# Patient Record
Sex: Female | Born: 1935 | Race: Black or African American | Hispanic: No | Smoking: Never smoker
Health system: Southern US, Community
[De-identification: ages and names within clinical notes are randomized; demographics above are authoritative.]

## PROBLEM LIST (undated history)

## (undated) DIAGNOSIS — M790 Rheumatism, unspecified: Secondary | ICD-10-CM

## (undated) DIAGNOSIS — E785 Hyperlipidemia, unspecified: Secondary | ICD-10-CM

## (undated) DIAGNOSIS — I10 Essential (primary) hypertension: Secondary | ICD-10-CM

## (undated) DIAGNOSIS — I38 Endocarditis, valve unspecified: Secondary | ICD-10-CM

## (undated) DIAGNOSIS — M549 Dorsalgia, unspecified: Secondary | ICD-10-CM

## (undated) DIAGNOSIS — I452 Bifascicular block: Secondary | ICD-10-CM

## (undated) DIAGNOSIS — R002 Palpitations: Secondary | ICD-10-CM

## (undated) DIAGNOSIS — M1990 Unspecified osteoarthritis, unspecified site: Secondary | ICD-10-CM

## (undated) DIAGNOSIS — C50919 Malignant neoplasm of unspecified site of unspecified female breast: Secondary | ICD-10-CM

## (undated) DIAGNOSIS — F0390 Unspecified dementia without behavioral disturbance: Secondary | ICD-10-CM

## (undated) DIAGNOSIS — F419 Anxiety disorder, unspecified: Secondary | ICD-10-CM

## (undated) HISTORY — DX: Dorsalgia, unspecified: M54.9

## (undated) HISTORY — DX: Endocarditis, valve unspecified: I38

## (undated) HISTORY — DX: Palpitations: R00.2

## (undated) HISTORY — DX: Hyperlipidemia, unspecified: E78.5

## (undated) HISTORY — DX: Malignant neoplasm of unspecified site of unspecified female breast: C50.919

## (undated) HISTORY — PX: SHOULDER SURGERY: SHX246

## (undated) HISTORY — PX: EYE SURGERY: SHX253

## (undated) HISTORY — PX: APPENDECTOMY: SHX54

## (undated) HISTORY — PX: JOINT REPLACEMENT: SHX530

## (undated) HISTORY — PX: KNEE SURGERY: SHX244

## (undated) HISTORY — DX: Unspecified osteoarthritis, unspecified site: M19.90

## (undated) HISTORY — DX: Bifascicular block: I45.2

## (undated) HISTORY — DX: Essential (primary) hypertension: I10

## (undated) HISTORY — PX: ABDOMINAL HYSTERECTOMY: SHX81

## (undated) HISTORY — DX: Rheumatism, unspecified: M79.0

---

## 1999-01-03 ENCOUNTER — Encounter: Payer: Self-pay | Admitting: Family Medicine

## 1999-01-03 ENCOUNTER — Ambulatory Visit (HOSPITAL_COMMUNITY): Admission: RE | Admit: 1999-01-03 | Discharge: 1999-01-03 | Payer: Self-pay | Admitting: Family Medicine

## 1999-09-14 ENCOUNTER — Ambulatory Visit (HOSPITAL_COMMUNITY): Admission: RE | Admit: 1999-09-14 | Discharge: 1999-09-14 | Payer: Self-pay | Admitting: Family Medicine

## 1999-09-14 ENCOUNTER — Encounter: Payer: Self-pay | Admitting: Family Medicine

## 1999-10-16 ENCOUNTER — Other Ambulatory Visit: Admission: RE | Admit: 1999-10-16 | Discharge: 1999-10-16 | Payer: Self-pay | Admitting: *Deleted

## 1999-10-24 ENCOUNTER — Ambulatory Visit (HOSPITAL_COMMUNITY): Admission: RE | Admit: 1999-10-24 | Discharge: 1999-10-24 | Payer: Self-pay | Admitting: Family Medicine

## 2001-08-27 ENCOUNTER — Ambulatory Visit (HOSPITAL_COMMUNITY): Admission: RE | Admit: 2001-08-27 | Discharge: 2001-08-27 | Payer: Self-pay | Admitting: Family Medicine

## 2001-08-27 ENCOUNTER — Encounter: Payer: Self-pay | Admitting: Family Medicine

## 2003-06-08 ENCOUNTER — Ambulatory Visit (HOSPITAL_COMMUNITY): Admission: RE | Admit: 2003-06-08 | Discharge: 2003-06-08 | Payer: Self-pay | Admitting: Family Medicine

## 2003-07-28 ENCOUNTER — Other Ambulatory Visit: Admission: RE | Admit: 2003-07-28 | Discharge: 2003-07-28 | Payer: Self-pay | Admitting: Family Medicine

## 2003-12-06 ENCOUNTER — Ambulatory Visit (HOSPITAL_COMMUNITY): Admission: RE | Admit: 2003-12-06 | Discharge: 2003-12-06 | Payer: Self-pay | Admitting: Gastroenterology

## 2003-12-06 ENCOUNTER — Encounter (INDEPENDENT_AMBULATORY_CARE_PROVIDER_SITE_OTHER): Payer: Self-pay | Admitting: Specialist

## 2006-07-21 ENCOUNTER — Encounter: Admission: RE | Admit: 2006-07-21 | Discharge: 2006-07-21 | Payer: Self-pay | Admitting: Orthopedic Surgery

## 2006-09-18 ENCOUNTER — Ambulatory Visit (HOSPITAL_COMMUNITY): Admission: RE | Admit: 2006-09-18 | Discharge: 2006-09-19 | Payer: Self-pay | Admitting: Orthopedic Surgery

## 2006-10-23 ENCOUNTER — Encounter: Admission: RE | Admit: 2006-10-23 | Discharge: 2006-11-21 | Payer: Self-pay | Admitting: Orthopedic Surgery

## 2007-03-10 ENCOUNTER — Inpatient Hospital Stay (HOSPITAL_COMMUNITY): Admission: RE | Admit: 2007-03-10 | Discharge: 2007-03-14 | Payer: Self-pay | Admitting: Orthopedic Surgery

## 2008-09-29 ENCOUNTER — Encounter: Admission: RE | Admit: 2008-09-29 | Discharge: 2008-09-29 | Payer: Self-pay | Admitting: Family Medicine

## 2009-07-26 ENCOUNTER — Encounter: Payer: Self-pay | Admitting: Cardiology

## 2009-09-06 ENCOUNTER — Ambulatory Visit: Payer: Self-pay

## 2009-09-06 ENCOUNTER — Ambulatory Visit: Payer: Self-pay | Admitting: Cardiology

## 2009-09-06 DIAGNOSIS — E785 Hyperlipidemia, unspecified: Secondary | ICD-10-CM | POA: Insufficient documentation

## 2009-09-06 DIAGNOSIS — I359 Nonrheumatic aortic valve disorder, unspecified: Secondary | ICD-10-CM | POA: Insufficient documentation

## 2009-09-06 DIAGNOSIS — R1900 Intra-abdominal and pelvic swelling, mass and lump, unspecified site: Secondary | ICD-10-CM | POA: Insufficient documentation

## 2009-09-06 DIAGNOSIS — R002 Palpitations: Secondary | ICD-10-CM | POA: Insufficient documentation

## 2009-09-06 DIAGNOSIS — I080 Rheumatic disorders of both mitral and aortic valves: Secondary | ICD-10-CM | POA: Insufficient documentation

## 2009-09-06 DIAGNOSIS — I10 Essential (primary) hypertension: Secondary | ICD-10-CM

## 2009-09-25 ENCOUNTER — Ambulatory Visit: Payer: Self-pay

## 2009-09-25 ENCOUNTER — Encounter: Payer: Self-pay | Admitting: Cardiology

## 2009-09-25 ENCOUNTER — Ambulatory Visit: Payer: Self-pay | Admitting: Cardiovascular Disease

## 2009-09-25 ENCOUNTER — Ambulatory Visit (HOSPITAL_COMMUNITY): Admission: RE | Admit: 2009-09-25 | Discharge: 2009-09-25 | Payer: Self-pay | Admitting: Cardiology

## 2009-10-02 ENCOUNTER — Telehealth: Payer: Self-pay | Admitting: Cardiology

## 2009-10-06 ENCOUNTER — Encounter: Payer: Self-pay | Admitting: Cardiology

## 2010-04-10 NOTE — Procedures (Signed)
Summary: Summary Report  Summary Report   Imported By: Erle Crocker 10/13/2009 11:20:25  _____________________________________________________________________  External Attachment:    Type:   Image     Comment:   External Document

## 2010-04-10 NOTE — Progress Notes (Signed)
Summary: returning call  Phone Note Call from Patient   Caller: Patient Reason for Call: Talk to Nurse Summary of Call: pt returning call-[phone] Initial call taken by: Glynda Jaeger,  October 02, 2009 2:35 PM  Follow-up for Phone Call        I spoke with the pt. Follow-up by: Sherri Rad, RN, BSN,  October 02, 2009 4:45 PM

## 2010-04-10 NOTE — Progress Notes (Signed)
Summary: [facility] Med List   [facility] Med List   Imported By: Roderic Ovens 09/08/2009 09:57:34  _____________________________________________________________________  External Attachment:    Type:   Image     Comment:   External Document

## 2010-04-10 NOTE — Letter (Signed)
Summary: [facility] Office Visit Note   [facility] Visit Note   Imported By: Roderic Ovens 09/08/2009 09:56:51  _____________________________________________________________________  External Attachment:    Type:   Image     Comment:   External Document

## 2010-04-10 NOTE — Assessment & Plan Note (Signed)
Summary: np6   Visit Type:  Initial Consult Primary Provider:  dr bland   History of Present Illness: Patient is 77 years old and is referred for evaluation of an irregular heartbeat and a heart murmur. She has known about an irregular heartbeat for more than a year. She cannot feel any palpitations but when she takes her pulse she sometimes notices a skip. She was recently seen for evaluation by the [facility] nursing staff at her assisted living facility and then later by Dr. [facility] Simmers at which time a new heart murmur was heard. She did not aware of this previously.  She has had no chest pain and no shortness of breath and no palpitations. She is quite active and transports her granddaughter to sports events and many other events. She has been doing regular exercise at her assisted living until recently when she was losing weight and decided to cut back.  Her risk profile for vascular disease includes hypertension and hyperlipidemia.  Current Medications (verified): 1)  Amlodipine Besylate 10 Mg Tabs (Amlodipine Besylate) .Marland Kitchen.. 1 Tab Once Daily 2)  Celebrex 200 Mg Caps (Celecoxib) .Marland Kitchen.. 1 Tab Once Daily 3)  Timolol Maleate 0.25 % Soln (Timolol Maleate) .Marland Kitchen.. 1 Drop in Left Eye Twice A Day 4)  Cozaar 100 Mg Tabs (Losartan Potassium) .Marland Kitchen.. 1 Tab Once Daily 5)  Aspirin 81 Mg Tbec (Aspirin) .... As Needed 6)  Xalatan 0.005 % Soln (Latanoprost) .Marland Kitchen.. 1 Drop Both Eyes 7)  Crestor 5 Mg Tabs (Rosuvastatin Calcium) .... Take One Tablet By Mouth Daily. 8)  Multivitamins   Tabs (Multiple Vitamin) .Marland Kitchen.. 1 Tab Once Daily 9)  Iron Otc .Marland Kitchen.. 1 Tab Once Daily  Allergies (verified): No Known Drug Allergies  Past History:  Past Medical History: Reviewed history from 09/06/2009 and no changes required. Arthritis/ rheumantism  back pain hypertension irregular pulse  Family History: Reviewed history from 09/06/2009 and no changes required. Father Bleeding Disorder and Diabetes Mother: Bleeding Disorder and  Diabetes  Social History: martial status :widowed Alcohol : Non -Drinker Tobacco- Smoking Status :Quit Smoke Occupation : Retired  Drugs :Takes no drugs  She lives in assisted living and is involved in taking care of her granddaughter.  Review of Systems       ROS is negative except as outlined in HPI.   Vital Signs:  Patient profile:   77 year old female Height:      59 inches Weight:      124 pounds BMI:     25.14 Pulse rate:   68 / minute BP sitting:   142 / 78  (left arm)  Vitals Entered By: Burnett Kanaris, CNA (September 06, 2009 9:55 AM)  Physical Exam  Additional Exam:  Gen. Well-nourished, in no distress   Neck: No JVD, thyroid not enlarged, no carotid bruits Lungs: No tachypnea, clear without rales, rhonchi or wheezes Cardiovascular: Rhythm regular, PMI not displaced,  heart sounds  normal, grade 2/6 early diastolic murmur at the left sternal edge, no peripheral edema, pulses normal in all 4 extremities. Abdomen: BS normal, abdomen soft and non-tender without masses or organomegaly, no hepatosplenomegaly. MS: No deformities, no cyanosis or clubbing   Neuro:  No focal sns   Skin:  no lesions    Impression & Recommendations:  Problem # 1:  AORTIC STENOSIS/ INSUFFICIENCY, NON-RHEUMATIC (ICD-424.1)  She has a murmur of aortic insufficiency. This does not appear to be severe. I think it is unlikely that this will require any treatment. We will evaluate  this further with a 2-D echocardiogram. Her updated medication list for this problem includes:    Cozaar 100 Mg Tabs (Losartan potassium) .Marland Kitchen... 1 tab once daily  Her updated medication list for this problem includes:    Cozaar 100 Mg Tabs (Losartan potassium) .Marland Kitchen... 1 tab once daily  Orders: EKG w/ Interpretation (93000) Echocardiogram (Echo)  Problem # 2:  PALPITATIONS (ICD-785.1)  She has an irregular pulse but really has not had any symptoms from this including palpitations. We will plan to evaluate this further  with a Holter monitor. I did hear some skips on examination but we did not catch any means on her 12-lead ECG. Her updated medication list for this problem includes:    Amlodipine Besylate 10 Mg Tabs (Amlodipine besylate) .Marland Kitchen... 1 tab once daily    Aspirin 81 Mg Tbec (Aspirin) .Marland Kitchen... As needed  Her updated medication list for this problem includes:    Amlodipine Besylate 10 Mg Tabs (Amlodipine besylate) .Marland Kitchen... 1 tab once daily    Aspirin 81 Mg Tbec (Aspirin) .Marland Kitchen... As needed  Orders: EKG w/ Interpretation (93000) Echocardiogram (Echo) Holter Monitor (Holter Monitor)  Problem # 3:  HYPERTENSION, BENIGN (ICD-401.1)  Her blood pressure was borderline today but has been lower on previous readings. This problem appears to be well-controlled on current medications. Her updated medication list for this problem includes:    Amlodipine Besylate 10 Mg Tabs (Amlodipine besylate) .Marland Kitchen... 1 tab once daily    Cozaar 100 Mg Tabs (Losartan potassium) .Marland Kitchen... 1 tab once daily    Aspirin 81 Mg Tbec (Aspirin) .Marland Kitchen... As needed  Her updated medication list for this problem includes:    Amlodipine Besylate 10 Mg Tabs (Amlodipine besylate) .Marland Kitchen... 1 tab once daily    Cozaar 100 Mg Tabs (Losartan potassium) .Marland Kitchen... 1 tab once daily    Aspirin 81 Mg Tbec (Aspirin) .Marland Kitchen... As needed  Problem # 4:  HYPERLIPIDEMIA-MIXED (ICD-272.4)  She is currently on Crestor. His promise managed by her primary care physician. According to patient she plans to try diet with plans to get off her statin in the near future. Her updated medication list for this problem includes:    Crestor 5 Mg Tabs (Rosuvastatin calcium) .Marland Kitchen... Take one tablet by mouth daily.  Her updated medication list for this problem includes:    Crestor 5 Mg Tabs (Rosuvastatin calcium) .Marland Kitchen... Take one tablet by mouth daily.  Other Orders: Abdominal Aorta Duplex (Abd Aorta Duplex)  Patient Instructions: 1)  Your physician has requested that you have an abdominal aorta  duplex. During this test, an ultrasound is used to evaluate the aorta. Allow 30 minutes for this exam. Do not eat after midnight the day before and avoid carbonated beverages. There are no restrictions or special instructions. 2)  Your physician has requested that you have an echocardiogram.  Echocardiography is a painless test that uses sound waves to create images of your heart. It provides your doctor with information about the size and shape of your heart and how well your heart's chambers and valves are working.  This procedure takes approximately one hour. There are no restrictions for this procedure. 3)  Your physician has recommended that you wear a 24 hour holter monitor.  Holter monitors are medical devices that record the heart's electrical activity. Doctors most often use these monitors to diagnose arrhythmias. Arrhythmias are problems with the speed or rhythm of the heartbeat. The monitor is a small, portable device. You can wear one while you do your normal  daily activities. This is usually used to diagnose what is causing palpitations/syncope (passing out). 4)  Your physician recommends that you continue on your current medications as directed. Please refer to the Current Medication list given to you today. 5)  We will call you with the results of your tests. If they show any abnormalities, we will schedule you a followup appointment.

## 2010-07-24 NOTE — Op Note (Signed)
Brianna Hunter, Brianna Hunter           ACCOUNT NO.:  [id number]   MEDICAL RECORD NO.:  [id number]          PATIENT TYPE:  INP   LOCATION:  2899                         FACILITY:  [facility]   PHYSICIAN:  Myrtie Neither, MD      DATE OF BIRTH:  07/18/1928   DATE OF PROCEDURE:  03/10/2007  DATE OF DISCHARGE:                               OPERATIVE REPORT   PREOPERATIVE DIAGNOSIS:  Degenerative joint disease, left knee.   POSTOPERATIVE DIAGNOSIS:  Degenerative joint disease, left knee.   OPERATIONS:  Left total knee arthroplasty, Biomet implant.   SURGEON:  Myrtie Neither, M.D.   ANESTHESIA:  General.   DESCRIPTION OF PROCEDURE:  The patient was taken to the operating room  after giving adequate preoperative medication.  Given general anesthesia  and intubated.  The left lower extremity was prepped with DuraPrep and  draped in a sterile manner.  Tourniquet and Bovie were used for  hemostasis.  Anterior midline incision made over the left knee going  through the skin and subcutaneous tissue.  Sharp and blunt dissection  was made both medially and laterally extending from the tibial tubercle  up to the quadriceps.  A medial paramedial incision made into the  capsule extending from the tibial tubercle up to the quadriceps.  Patella was reflected laterally.  The knee was taken to a flexed  position.  Osteophytes about the patella and femur were resected.  Soft  tissue resection was then done.  Next, tibial cutting jig set at 10 mm  was put in place and appropriate tibial plateau surface resection was  done.  Reaming down the femoral canal was then done followed by the  distal femoral cutting jig.  Sizing of the femur was 65 mm.  Appropriate  cutting jig was then put in place for anterior posterior cuts and  chamfering cuts.  The osteophytes about the femur were resected.  Trial  femoral component was found to fit very good and snug.  Attention was  then turned to the tibial surface which was  measured at 71 mm and  appropriate cutting jig was put in place and the appropriate cut made.  With the femoral and tibial trial components put in place, the knee was  found to go into full extension, full flexion, good medial and lateral  stability with 10 mm poly.  Sizing of the patella was at 34 mm and  appropriate cutting jig and cut was made.  With all three trial  components in place, there was full extension, full flexion, good medial  and lateral stability, no subluxation of the patella.  A 12 mm trial  poly was put in place and was found to be an even better fit.  Trial  components were removed.  Irrigation was then done.  Methyl methacrylate  mixed.  The patella and tibial components were cemented and the femoral  component was Press-Fit.  After removal of excess methyl methacrylate  and setting of the cement, trial tibial poly was then put back in place  and again 12 mm was found to be a very good fit.  Final poly was then  locked in place.  Range of motion, full extension, full flexion, good  medial and lateral stability and very good tracking of the patella.  Copious irrigation was then done.  Hemostasis obtained with the Bovie followed by wound closure, 0 for the  fascia, 2-0 for the subcutaneous and skin staples for the skin.  Compressive dressings applied.  Knee immobilizer applied.  The patient  tolerated the procedure quite well and went to the recovery room in  stable and satisfactory condition.      Myrtie Neither, MD  Electronically Signed     AC/MEDQ  D:  03/10/2007  T:  03/10/2007  Job:  [id number]

## 2010-07-24 NOTE — Op Note (Signed)
Brianna Hunter, Brianna Hunter           ACCOUNT NO.:  [id number]   MEDICAL RECORD NO.:  [id number]          PATIENT TYPE:  AMB   LOCATION:  SDS                          FACILITY:  [facility]   PHYSICIAN:  Myrtie Neither, MD      DATE OF BIRTH:  1928/09/06   DATE OF PROCEDURE:  09/18/2006  DATE OF DISCHARGE:                               OPERATIVE REPORT   PREOPERATIVE DIAGNOSES:  Chronic rotator cuff tear, impingement syndrome  left shoulder.   POSTOPERATIVE DIAGNOSES:  Chronic rotator cuff tear, impingement  syndrome left shoulder with loose body.   ANESTHESIA:  General, scalene block.   PROCEDURE:  1. Arthroscopic acromioplasty, synovectomy, debridement and      acromioplasty of left shoulder.  2. Mini open rotator cuff repair using Biomet anchor.   The patient was taken to the operating room after given adequate preop  medications, given general anesthesia and intubated. The patient was  placed in barber chair position, left shoulder prepped with DuraPrep and  draped in a sterile manner.  A 1/2-inch puncture wound was made along  the posterior aspect of the left shoulder. A swisher rod was placed  posterior to anterior, an anterior inflow __________  incision was then  made.  A lateral incision was made for the shaver.  Inspection of the  joint revealed chondromalacia changes, loose body anteriorly within the  joint, chronic tear of the rotator cuff anteriorly and laterally.  Complete synovectomy was done with acromioplasty and debridement of the  joint lining.  After adequate decompression and removal of loose  material, a separate lateral incision was made, a mini incision was made  going through the skin and subcutaneous tissue.  The deltoid was split  and a Biomet anchor was placed within the bone.  Sutures were placed  through the posterior remnant and anterior remnant of the cuff tendon  and pulled together back down to the greater tuberosity.  Copious  irrigation was done.   Further inspection did not reveal any entrapment  of the repair against the acromion.  Wound closure was then done with #0  Vicryl for the fascia, 2-0 for the subcutaneous and skin staples for the  skin.  A compressive dressing was applied.  Abduction shoulder brace was  then applied.  The patient tolerated the procedure quite well and went  to the recovery room in stable and satisfactory condition.  The patient  is being kept for 23-hour observation for pain control.  The patient  went to the recovery room in stable and satisfactory condition.      Myrtie Neither, MD  Electronically Signed     AC/MEDQ  D:  09/18/2006  T:  09/18/2006  Job:  [id number]

## 2010-07-27 NOTE — Discharge Summary (Signed)
NAMEKENIDY, CROSSLAND           ACCOUNT NO.:  [id number]   MEDICAL RECORD NO.:  [id number]          PATIENT TYPE:  INP   LOCATION:  5031                         FACILITY:  [facility]   PHYSICIAN:  Myrtie Neither, MD      DATE OF BIRTH:  Jun 26, 1928   DATE OF ADMISSION:  03/10/2007  DATE OF DISCHARGE:  03/14/2007                               DISCHARGE SUMMARY   ADMISSION DIAGNOSIS:  Degenerative joint disease of left knee.   DISCHARGE DIAGNOSIS:  Degenerative joint disease of left knee.   COMPLICATIONS:  None.   OPERATION:  Left total knee arthroplasty done on March 10, 2007.   PERTINENT HISTORY:  This is a 77 year old female with bilateral  degenerative joint disease of knees, left being worse than the right.  The patient has been treated with anti-inflammatories and use of cane,  warm compresses without improvement.  Pertinent physical was that of the  left knee genu varum, crepitus medial lateral compartment, +2 effusion.  Range of motion is limited.  Neurovascular status intact.   The patient underwent preop medical evaluation and was found to be  stable to undergo surgery.  The patient underwent preop labs, CBC, UA,  EKG, chest x-ray, PT, PTT, platelet count, CMET, and the patient's labs  were stable.   HOSPITAL COURSE:  The patient underwent left total knee arthroplasty,  tolerated procedure quite well. Underwent pre and postop IV antibiotics,  postop prophylactic Coumadin therapy, use of CPM, physical therapy.  The  patient progressed fairly slowly initially on therapy but gradually  picked up well enough to be discharged home. The patient has home health  PT arranged. Weightbearing as tolerated on the right side and continued  on Coumadin therapy. INRs on outpatient basis.  1. Feosol at 325 mg 3 times a day.  2. Percocet 5 mg one to two q. 6 h  p.r.n.   Return to the office in 1 week.  The patient was discharged in stable  and satisfactory condition.      Myrtie Neither, MD  Electronically Signed     AC/MEDQ  D:  04/01/2007  T:  04/01/2007  Job:  [id number]

## 2010-07-27 NOTE — Op Note (Signed)
Brianna Hunter, Brianna Hunter           ACCOUNT NO.:  [id number]   MEDICAL RECORD NO.:  [id number]          PATIENT TYPE:  AMB   LOCATION:  ENDO                         FACILITY:  [facility]   PHYSICIAN:  Graylin Shiver, M.D.   DATE OF BIRTH:  03-May-1928   DATE OF PROCEDURE:  12/06/2003  DATE OF DISCHARGE:                                 OPERATIVE REPORT   PROCEDURE:  Colonoscopy with biopsy.   INDICATIONS:  Screening.   Informed consent was obtained after explanation of the risks of bleeding,  infection, and perforation.   PREMEDICATION:  Fentanyl 60 mcg IV, Versed 6 mg IV.   PROCEDURE:  With the patient in the left lateral decubitus position, a  rectal exam was performed.  No masses were felt.  The Olympus colonoscope  was inserted into the rectum and advanced around a tortuous colon to the  cecum.  Cecal landmarks were identified.  The cecum was normal.  In the  ascending colon there was a small 3 mm polyp biopsied off with cold forceps.  The transverse colon looked normal.  The descending colon, sigmoid, and  rectum looked normal.  She tolerated the procedure well without  complications.   IMPRESSION:  Small polyp in the ascending colon, 211.3.   PLAN:  The pathology will be checked.       SFG/MEDQ  D:  12/06/2003  T:  12/06/2003  Job:  [id number]   cc:   Renaye Rakers, M.D.  [phone], [address]  [place]  [place] [address]  Fax: [phone]

## 2010-08-09 ENCOUNTER — Encounter: Payer: Self-pay | Admitting: Cardiology

## 2010-08-20 ENCOUNTER — Encounter (HOSPITAL_COMMUNITY)
Admission: RE | Admit: 2010-08-20 | Discharge: 2010-08-20 | Disposition: A | Discharge status: Home / Self Care | Payer: Medicare FFS | Source: Ambulatory Visit | Attending: Ophthalmology | Admitting: Ophthalmology

## 2010-08-20 ENCOUNTER — Other Ambulatory Visit (HOSPITAL_COMMUNITY): Payer: Self-pay | Admitting: Ophthalmology

## 2010-08-20 DIAGNOSIS — Z01811 Encounter for preprocedural respiratory examination: Secondary | ICD-10-CM

## 2010-08-20 LAB — BASIC METABOLIC PANEL
CO2: 27 mEq/L (ref 19–32)
Calcium: 9.7 mg/dL (ref 8.4–10.5)
Chloride: 108 mEq/L (ref 96–112)
Creatinine, Ser: 0.65 mg/dL (ref 0.4–1.2)
Glucose, Bld: 100 mg/dL — ABNORMAL HIGH (ref 70–99)
Sodium: 142 mEq/L (ref 135–145)

## 2010-08-20 LAB — CBC
Hemoglobin: 13.2 g/dL (ref 12.0–15.0)
MCH: 23.2 pg — ABNORMAL LOW (ref 26.0–34.0)
MCV: 70.8 fL — ABNORMAL LOW (ref 78.0–100.0)
Platelets: 159 10*3/uL (ref 150–400)
RBC: 5.68 MIL/uL — ABNORMAL HIGH (ref 3.87–5.11)
WBC: 4.5 10*3/uL (ref 4.0–10.5)

## 2010-08-21 ENCOUNTER — Encounter: Payer: Self-pay | Admitting: Cardiology

## 2010-08-21 ENCOUNTER — Ambulatory Visit (INDEPENDENT_AMBULATORY_CARE_PROVIDER_SITE_OTHER): Payer: Medicare FFS | Admitting: Cardiology

## 2010-08-21 VITALS — BP 135/67 | HR 64 | Ht <= 58 in | Wt 116.0 lb

## 2010-08-21 DIAGNOSIS — I359 Nonrheumatic aortic valve disorder, unspecified: Secondary | ICD-10-CM

## 2010-08-21 DIAGNOSIS — I10 Essential (primary) hypertension: Secondary | ICD-10-CM

## 2010-08-21 DIAGNOSIS — I38 Endocarditis, valve unspecified: Secondary | ICD-10-CM

## 2010-08-21 DIAGNOSIS — R002 Palpitations: Secondary | ICD-10-CM

## 2010-08-21 NOTE — Patient Instructions (Signed)
Schedule an appointment for an echocardiogram.  Your physician wants you to follow-up in: 1 year with Dr McLean. (June 2013). You will receive a reminder letter in the mail two months in advance. If you don't receive a letter, please call our office to schedule the follow-up appointment.  

## 2010-08-22 ENCOUNTER — Ambulatory Visit (HOSPITAL_COMMUNITY)
Admission: RE | Admit: 2010-08-22 | Discharge: 2010-08-22 | Disposition: A | Discharge status: Home / Self Care | Payer: Medicare FFS | Source: Ambulatory Visit | Attending: Ophthalmology | Admitting: Ophthalmology

## 2010-08-22 DIAGNOSIS — H4011X0 Primary open-angle glaucoma, stage unspecified: Secondary | ICD-10-CM | POA: Insufficient documentation

## 2010-08-22 DIAGNOSIS — H2510 Age-related nuclear cataract, unspecified eye: Secondary | ICD-10-CM | POA: Insufficient documentation

## 2010-08-22 DIAGNOSIS — I38 Endocarditis, valve unspecified: Secondary | ICD-10-CM | POA: Insufficient documentation

## 2010-08-22 LAB — SURGICAL PCR SCREEN
MRSA, PCR: NEGATIVE
Staphylococcus aureus: NEGATIVE

## 2010-08-22 NOTE — Progress Notes (Signed)
PCP: Dr. Parke Simmers  77 yo prior patient of Dr. Juanda Chance presents for cardiology followup.  She was initially seen for murmur on exam.  Echo in 7/11 showed mild to moderate aortic insufficiency, mild to moderate mitral regurgitation, and moderate pulmonic insufficiency. She has been doing well.  She denies exertional dyspnea or chest pain.  She lives in an assisted living [facility]).  Systolic BP had home is running in the 120s-130s.  She has rare palpitations.  She is going to undergo a cataract operation tomorrow.   ECG: NSR at 52, RBBB with LAFB, LVH  Labs (6/12): K 4.4, creatinine 0.65  PMH:  1. HTN 2. Hyperlipidemia 3. Palpitations: Holter monitor (7/11) with isolated PVCs and PACs 4. Echo (7/11): EF 55-60%, grade I diastolic dysfunction, mild-moderate AI, mild-moderate MR, moderate PI, PA systolic pressure 30 mmHg 5. Bifascicular block on ECG  SH: Widow.  No tobacco or ETOH.  Lives in an assisted living.  Still driving.   FH: No premature CAD  ROS: All systems reviewed and negative except as per HPI.   Current Outpatient Prescriptions  Medication Sig Dispense Refill  . aspirin 81 MG EC tablet Take 81 mg by mouth daily.        . Bimatoprost (LUMIGAN) 0.01 % SOLN Apply 1 drop to eye 3 (three) times daily.        . celecoxib (CELEBREX) 200 MG capsule Take 200 mg by mouth daily.        . Iron Combinations (IRON COMPLEX PO) Take by mouth daily.        Marland Kitchen latanoprost (XALATAN) 0.005 % ophthalmic solution Place 1 drop into both eyes.        . Multiple Vitamin (MULTIVITAMIN) tablet Take 1 tablet by mouth daily.        . rosuvastatin (CRESTOR) 5 MG tablet Take 5 mg by mouth daily.        . timolol (BETIMOL) 0.25 % ophthalmic solution Place 1 drop into the left eye 2 (two) times daily.          BP 135/67  Pulse 64  Ht 4\' 10"  (1.473 m)  Wt 116 lb (52.617 kg)  BMI 24.24 kg/m2 General: NAD Neck: No JVD, no thyromegaly or thyroid nodule.  Lungs: Clear to auscultation bilaterally with  normal respiratory effort. CV: Nondisplaced PMI.  Heart regular S1/S2, no S3/S4, 2/6 diastolic murmur along the sternal border.  No peripheral edema.  No carotid bruit.  Normal pedal pulses. Bilateral ankle varicosities. Abdomen: Soft, nontender, no hepatosplenomegaly, no distention.  Neurologic: Alert and oriented x 3.  Psych: Normal affect. Extremities: No clubbing or cyanosis.

## 2010-08-22 NOTE — Assessment & Plan Note (Signed)
Mild to moderate aortic insufficiency, mild to moderate MR, and moderate PI on last echo.  Diastolic murmur on exam.  Stable without exertional dyspnea.  I will arrange for an echo to followup on her valvular disease.

## 2010-08-23 ENCOUNTER — Encounter: Payer: Self-pay | Admitting: Cardiology

## 2010-08-23 NOTE — Assessment & Plan Note (Signed)
BP is under good control on current meds.  

## 2010-08-23 NOTE — Assessment & Plan Note (Signed)
Rare, had PVCs and PACs on holter in 7/11.

## 2010-08-30 NOTE — Op Note (Signed)
Brianna Hunter, Brianna Hunter           ACCOUNT NO.:  [id number]  MEDICAL RECORD NO.:  [id number]  LOCATION:  SDSC                         FACILITY:  [facility]  PHYSICIAN:  Chalmers Guest, M.D.     DATE OF BIRTH:  10-Jul-1928  DATE OF PROCEDURE:  08/22/2010 DATE OF DISCHARGE:  08/22/2010                              OPERATIVE REPORT   PREOPERATIVE DIAGNOSIS:  Uncontrolled highly elevated intraocular pressure, left eye despite maximum tolerated medical therapy and laser therapy.  The patient also has a visually significant cataract left eye.  POSTOPERATIVE DIAGNOSIS:  Uncontrolled highly elevated intraocular pressure, left eye despite maximum tolerated medical therapy and laser therapy.  The patient also has a visually significant cataract left eye.  PROCEDURE:  Phacoemulsification with intraocular lens implant and trabeculectomy with mitomycin C with peripheral iridectomy, left eye.  COMPLICATIONS:  None.  ANESTHESIA:  Consisted of 2% Xylocaine in a 50/50 mixture with 0.75% Marcaine with epinephrine with one ampule of Wydase.  PROCEDURE:  The patient was transported to the operating room where she was given a peribulbar block with the aforementioned local anesthetic agent.  The patient's face was then prepped and draped in the usual sterile fashion with the surgeon sitting superiorly.  A 6-0 nylon suture was passed through clear cornea to protect the eye.  With eye in the intraductal position, Hoskin forceps was used to grasp the conjunctiva at the superior nasal limbus and a Westcott scissors were used to make an incision using a sharp scissors and then using the blunt scissors to dissect posteriorly under the conjunctiva recessing the conjunctiva. The blade was used to recess bleeding and then bleeding was controlled with cautery.  A 45-degree blade was used to fashion a half-thickness scleral flap.  A forceps were used with the Grieshaber 57 blade to dissect anteriorly to identify the  limbal tissues.  Following this, mitomycin C 0.4 mg/mL was placed on a Gelfoam sponge and placed under the conjunctiva and allowed to stay on the eye for 2 minutes.  The sponges were removed and the eye was irrigated with 60 mL of balanced salt solution.  Following this, a 50-degree blade was used to enter through temporal clear cornea at the 1 o'clock position and Viscoat was injected in the eye.  Following this, the scleral flap was elevated and 2.75 mm keratome blade was used in a stepwise fashion under the scleral flap to enter the chamber then a bent 25 gauge needle was used to incise the anterior capsule and continuous tear anterior capsulorrhexis was formed.  The Utrata forceps was used to remove the anterior capsule. BSS was used to [employer] and hydro delineate the nucleus. Following this, the phacoemulsification unit was then used to sculpt the nucleus and remove the epinucleus.  The phaco unit was then buried into the inferior nucleus and elevated.  Kugel hook was used to snap the nucleus.  Following this, the nucleus was rotated.  The phaco tip was again buried in the nucleus and the nucleus was snapped until 4 quadrants had been separated.  Phaco unit was then used to remove all nuclear fragments from the eye.  The epinucleus was elevated with the course hook and irrigation.  Following  this, the epinucleus was then removed using the IA.  After all epinucleus was removed, the cortical fibers were stripped from the posterior capsule.  The curved IA was used to remove sub incisional cortex.  Provisc was injected in the eye.  The intraocular lens implant was examined and noted to have no defects.  The lens was an Alcon Aqueous Soft IQ lens model number [id number], the lens was a 22 diopter lens.  It was folded and then injected and unfolded in the capsular bag and positioned with the Kugel hook. Following this, the scleral flap was elevated and thus maze sponge was used  to remove the 1 x 3 block of tissue.  Miostat was injected in the eye and a basilar peripheral iridectomy were formed using a Chandler forceps and scissor.  Following this, two interrupted 10-0 nylon sutures were placed to secure the flap on each corner.  The anterior chamber was deepened.  The IA had been used to remove part of the viscoelastic from the eye.  Following this, a 9-0 Vicryl suture on a BV 100 needle was then used to secure the conjunctiva suturing nasally and temporally. BSS was injected in the chamber.  The anterior chamber deepened.  The bleb elevated.  The incision was Seidel negative.  A subconjunctival injection of Kenalog 4 mg was given in inferonasal quadrants.  Following this, topical TobraDex ointment was applied to the eye.  A patch and Fox shield were placed and the patient returned to recovery area in stable condition.     Chalmers Guest, M.D.     RW/MEDQ  D:  08/22/2010  T:  08/23/2010  Job:  [id number]  Electronically Signed by Chalmers Guest M.D. on 08/30/2010 08:06:31 PM

## 2010-09-20 ENCOUNTER — Ambulatory Visit (HOSPITAL_COMMUNITY): Payer: Medicare FFS | Attending: Cardiology

## 2010-09-20 DIAGNOSIS — I379 Nonrheumatic pulmonary valve disorder, unspecified: Secondary | ICD-10-CM | POA: Insufficient documentation

## 2010-09-20 DIAGNOSIS — I080 Rheumatic disorders of both mitral and aortic valves: Secondary | ICD-10-CM | POA: Insufficient documentation

## 2010-09-20 DIAGNOSIS — I10 Essential (primary) hypertension: Secondary | ICD-10-CM | POA: Insufficient documentation

## 2010-09-20 DIAGNOSIS — I079 Rheumatic tricuspid valve disease, unspecified: Secondary | ICD-10-CM | POA: Insufficient documentation

## 2010-09-20 DIAGNOSIS — I359 Nonrheumatic aortic valve disorder, unspecified: Secondary | ICD-10-CM

## 2010-11-29 LAB — PROTIME-INR
INR: 1.6 — ABNORMAL HIGH
INR: 2.8 — ABNORMAL HIGH
Prothrombin Time: 24.2 — ABNORMAL HIGH
Prothrombin Time: 30.6 — ABNORMAL HIGH

## 2010-11-29 LAB — CBC
HCT: 27.7 — ABNORMAL LOW
Hemoglobin: 9.1 — ABNORMAL LOW
RBC: 3.85 — ABNORMAL LOW

## 2010-12-14 LAB — ABO/RH: ABO/RH(D): A POS

## 2010-12-14 LAB — CBC
MCV: 73.1 — ABNORMAL LOW
Platelets: 118 — ABNORMAL LOW
Platelets: 168
RDW: 14.8
WBC: 4.1
WBC: 8.3

## 2010-12-14 LAB — COMPREHENSIVE METABOLIC PANEL
ALT: 16
AST: 19
Albumin: 3.6
Alkaline Phosphatase: 87
Chloride: 106
GFR calc Af Amer: >60
Potassium: 4.7
Sodium: 139
Total Bilirubin: 0.8
Total Protein: 7

## 2010-12-14 LAB — URINALYSIS, ROUTINE W REFLEX MICROSCOPIC
Glucose, UA: NEGATIVE
Hgb urine dipstick: NEGATIVE
Specific Gravity, Urine: 1.017

## 2010-12-14 LAB — PROTIME-INR
INR: 1.1
Prothrombin Time: 17.2 — ABNORMAL HIGH

## 2010-12-14 LAB — TYPE AND SCREEN
ABO/RH(D): A POS
Antibody Screen: NEGATIVE

## 2010-12-25 LAB — URINALYSIS, ROUTINE W REFLEX MICROSCOPIC
Bilirubin Urine: NEGATIVE
Glucose, UA: NEGATIVE
Hgb urine dipstick: NEGATIVE
Ketones, ur: NEGATIVE
pH: 6

## 2010-12-25 LAB — COMPREHENSIVE METABOLIC PANEL
ALT: 21
AST: 22
Albumin: 3.7
CO2: 28
Calcium: 10
GFR calc Af Amer: >60
GFR calc non Af Amer: >60
Sodium: 142
Total Protein: 7.4

## 2010-12-25 LAB — CBC
MCHC: 31.9
Platelets: 182
RBC: 5.66 — ABNORMAL HIGH

## 2010-12-25 LAB — URINE MICROSCOPIC-ADD ON

## 2011-10-02 ENCOUNTER — Other Ambulatory Visit: Payer: Self-pay | Admitting: Radiology

## 2011-10-07 ENCOUNTER — Other Ambulatory Visit: Payer: Self-pay | Admitting: *Deleted

## 2011-10-07 DIAGNOSIS — C50311 Malignant neoplasm of lower-inner quadrant of right female breast: Secondary | ICD-10-CM | POA: Insufficient documentation

## 2011-10-07 DIAGNOSIS — C50319 Malignant neoplasm of lower-inner quadrant of unspecified female breast: Secondary | ICD-10-CM

## 2011-10-08 ENCOUNTER — Telehealth: Payer: Self-pay | Admitting: *Deleted

## 2011-10-08 NOTE — Telephone Encounter (Signed)
Confirmed [facility] for 10/09/11 at 1230.  Instructions and contact information given.

## 2011-10-09 ENCOUNTER — Encounter: Payer: Self-pay | Admitting: *Deleted

## 2011-10-09 ENCOUNTER — Ambulatory Visit
Admission: RE | Admit: 2011-10-09 | Discharge: 2011-10-09 | Disposition: A | Discharge status: Home / Self Care | Payer: Medicare HMO | Source: Ambulatory Visit | Attending: Radiation Oncology | Admitting: Radiation Oncology

## 2011-10-09 ENCOUNTER — Encounter: Payer: Self-pay | Admitting: Oncology

## 2011-10-09 ENCOUNTER — Ambulatory Visit: Payer: Medicare FFS | Admitting: Physical Therapy

## 2011-10-09 ENCOUNTER — Ambulatory Visit (HOSPITAL_BASED_OUTPATIENT_CLINIC_OR_DEPARTMENT_OTHER): Payer: Medicare FFS

## 2011-10-09 ENCOUNTER — Encounter: Payer: Self-pay | Admitting: Radiation Oncology

## 2011-10-09 ENCOUNTER — Ambulatory Visit (HOSPITAL_BASED_OUTPATIENT_CLINIC_OR_DEPARTMENT_OTHER): Payer: Medicare FFS | Admitting: General Surgery

## 2011-10-09 ENCOUNTER — Ambulatory Visit (HOSPITAL_BASED_OUTPATIENT_CLINIC_OR_DEPARTMENT_OTHER): Payer: Medicare FFS | Admitting: Oncology

## 2011-10-09 ENCOUNTER — Encounter (INDEPENDENT_AMBULATORY_CARE_PROVIDER_SITE_OTHER): Payer: Self-pay | Admitting: General Surgery

## 2011-10-09 ENCOUNTER — Other Ambulatory Visit (HOSPITAL_BASED_OUTPATIENT_CLINIC_OR_DEPARTMENT_OTHER): Payer: Medicare FFS | Admitting: Lab

## 2011-10-09 VITALS — BP 154/75 | HR 81 | Temp 98.5°F | Ht 58.5 in | Wt 115.0 lb

## 2011-10-09 DIAGNOSIS — C50919 Malignant neoplasm of unspecified site of unspecified female breast: Secondary | ICD-10-CM

## 2011-10-09 DIAGNOSIS — C50911 Malignant neoplasm of unspecified site of right female breast: Secondary | ICD-10-CM

## 2011-10-09 DIAGNOSIS — Z170 Estrogen receptor positive status [ER+]: Secondary | ICD-10-CM

## 2011-10-09 DIAGNOSIS — C50319 Malignant neoplasm of lower-inner quadrant of unspecified female breast: Secondary | ICD-10-CM

## 2011-10-09 LAB — COMPREHENSIVE METABOLIC PANEL
ALT: 12 U/L (ref 0–35)
AST: 16 U/L (ref 0–37)
Albumin: 3.5 g/dL (ref 3.5–5.2)
BUN: 21 mg/dL (ref 6–23)
CO2: 29 mEq/L (ref 19–32)
Calcium: 9.9 mg/dL (ref 8.4–10.5)
Chloride: 105 mEq/L (ref 96–112)
Creatinine, Ser: 0.8 mg/dL (ref 0.50–1.10)
Potassium: 4.9 mEq/L (ref 3.5–5.3)

## 2011-10-09 LAB — CBC WITH DIFFERENTIAL/PLATELET
Basophils Absolute: 0 10*3/uL (ref 0.0–0.1)
HCT: 37.5 % (ref 34.8–46.6)
HGB: 11.7 g/dL (ref 11.6–15.9)
MONO#: 0.6 10*3/uL (ref 0.1–0.9)
NEUT#: 2.2 10*3/uL (ref 1.5–6.5)
NEUT%: 51.5 % (ref 38.4–76.8)
RDW: 14.1 % (ref 11.2–14.5)
WBC: 4.3 10*3/uL (ref 3.9–10.3)
lymph#: 1.3 10*3/uL (ref 0.9–3.3)

## 2011-10-09 NOTE — Progress Notes (Signed)
New patient , patient aware of financial assistance patient not in need of assistance at this time patient has 1 insurance

## 2011-10-09 NOTE — Patient Instructions (Addendum)
Surgery first  Follow up with me in 4 weeks

## 2011-10-09 NOTE — Progress Notes (Signed)
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 female who was recently found on screening mammography to have a small mass in her right breast. Her left breast appeared stable. She underwent ultrasound which confirmed a 5 mm mass at the 4:00 position. This was biopsied by ultrasound guidance and revealed low-grade DCIS involving a papilloma. This is ER/PR positive. The patient has not undergone any MRI. The patient cannot palpate any new lesions in her breasts. She says she's been told she has a cyst in her left breast but she cannot palpate it. She is otherwise in her usual state of health. She still drives her granddaughter to work each day.  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 She also reports that she has been losing some weight -approximately 5 pounds over the past couple months. She she has cut milk out of her diet which seems to help with recent diarrhea.  PREVIOUS RADIATION THERAPY: No  PAST MEDICAL HISTORY:  has a past medical history of Arthritis; Rheumatism; Back pain; Hypertension; Pulse irregularity; and Breast cancer.    PAST SURGICAL HISTORY: Past Surgical History  Procedure Date  . Knee surgery     Left  . Shoulder surgery     Left    FAMILY HISTORY: family history includes Diabetes in her father and mother and Other in her father and mother.  SOCIAL HISTORY:  reports that she has never smoked. She does not have any smokeless tobacco history on file. She reports that she does not drink alcohol or use illicit drugs.  ALLERGIES: Review of patient's allergies indicates no known allergies.  MEDICATIONS:    Current Outpatient Prescriptions  Medication Sig Dispense Refill  . aspirin 81 MG EC tablet Take 81 mg by mouth daily.        . Bimatoprost (LUMIGAN) 0.01 % SOLN Apply 1 drop to eye 3 (three) times daily.        . celecoxib (CELEBREX) 200 MG capsule Take 200 mg by mouth daily.        . Iron Combinations (IRON COMPLEX PO) Take by mouth daily.        Marland Kitchen latanoprost (XALATAN) 0.005 % ophthalmic solution Place 1 drop into both eyes.        . Multiple Vitamin (MULTIVITAMIN) tablet Take 1 tablet by mouth daily.        . Olmesartan-Amlodipine-HCTZ (TRIBENZOR) 40-5-25 MG TABS Take by mouth.      . prednisoLONE acetate (PRED FORTE) 1 % ophthalmic suspension Place 1 drop into the left eye 4 (four) times daily.      . rosuvastatin (CRESTOR) 5 MG tablet Take 5 mg by mouth daily.        . timolol (BETIMOL) 0.25 % ophthalmic solution Place 1 drop into the left eye 2 (two) times daily.        . vitamin C (ASCORBIC ACID) 500 MG tablet Take 500 mg by mouth daily.        REVIEW OF SYSTEMS:  A 15 point review of systems is documented in the electronic medical record. This was obtained by the nursing staff. However, I reviewed this with the patient to discuss relevant findings and  make appropriate changes.     PHYSICAL EXAM:  Blood pressure 154/75, pulse 81, temperature 98.5, weight 115 pounds, height 4 foot 10 inches Sitting in a chair in no acute distress, appears a bit younger than her stated age  HEENT: Head is normocephalic.  Extraocular movements are intact. Oropharynx is clear. Neck: Neck is supple, no palpable cervical or supraclavicular lymphadenopathy. Heart: Regular in rate and rhythm with an audible murmur at the tricuspid region Chest: Clear to auscultation bilaterally, with no rhonchi, wheezes, or rales. Abdomen: Soft, nontender, nondistended, with no rigidity or guarding. Extremities: No cyanosis or edema. Lymphatics: No concerning lymphadenopathy. Skin: No concerning  lesions. Musculoskeletal: symmetric strength and muscle tone throughout. Neurologic: Cranial nerves II through XII are grossly intact. No obvious focalities. Speech is fluent. Coordination is intact. Psychiatric: Judgment and insight are intact. Affect is appropriate. Breasts: No hematoma or palpable lesions of concern in either breast. No palpable lymph nodes in the axillary regions bilaterally.    LABORATORY DATA:  Lab Results  Component Value Date   WBC 4.3 10/09/2011   HGB 11.7 10/09/2011   HCT 37.5 10/09/2011   MCV 72.4* 10/09/2011   PLT 140* 10/09/2011   Lab Results  Component Value Date   NA 140 10/09/2011   K 4.9 10/09/2011   CL 105 10/09/2011   CO2 29 10/09/2011   Lab Results  Component Value Date   ALT 12 10/09/2011   AST 16 10/09/2011   ALKPHOS 61 10/09/2011   BILITOT 0.2* 10/09/2011     RADIOGRAPHY: As above    IMPRESSION/PLAN: This is a very pleasant 78 year old patient with ER/PR positive DCIS of the right breast. She is keen on breast conservation rather than mastectomy. It has been explained her that her overall life expectancy is not compromised between choice of surgeries.  For the patient's DCIS, we had a thorough discussion about her options for adjuvant therapy. One option would be antiestrogen therapy as discussed with medical oncology. She would take a pill for approximately 5 years. The alternative option would be radiotherapy to the breast. We discussed the risks benefits and side effects of radiotherapy.  She understands that the side effects would likely include some skin irritation and fatigue during the weeks of radiation. There is a risk of late effects which include but are not necessarily limited to cosmetic changes and rare lung toxicity.  The patient met with medical oncology after my consultation with her this afternoon. After discussing issues with medical oncology, she is comfortable with antiestrogen therapy. I think this is a fine choice for her.  It  was a pleasure meeting the patient today.  I am available should she eventually need my care; however, it seems that she will likely go on to lumpectomy followed by antiestrogen therapy alone.   ------------------------------------------------   Lonie Peak, MD

## 2011-10-09 NOTE — Progress Notes (Signed)
Brianna Hunter [id number] September 17, 1928 78 y.o. 10/09/2011 2:51 PM  CC  [name], MD [address] [place] [address] Dr. Lodema Pilot Dr. Lonie Peak  REASON FOR CONSULTATION:  78 year old female with ductal carcinoma in situ of the right breast diagnosed 10/02/2011 by needle core biopsy. Patient was seen in the Multidisciplinary Breast Clinic for discussion of her treatment options. STAGE:   Cancer of lower-inner quadrant of female breast   Primary site: Breast (Right)   Staging method: AJCC 7th Edition   Clinical: Stage 0 (Tis (DCIS), N0, cM0)   Summary: Stage 0 (Tis (DCIS), N0, cM0)  REFERRING PHYSICIAN: Dr. Renaye Rakers  HISTORY OF PRESENT ILLNESS:  Brianna Hunter is a 78 y.o. female.  With medical history significant for hypertension and glaucoma. Patient recently had a screening mammogram performed that showed a right breast mass. She went on to have diagnostic mammogram that confirmed the mass. She then had a biopsy that showed a low grade ductal carcinoma in situ that was ER positive PR positive involving a papilloma. Because of this she is seen in the multidisciplinary clinic for discussion of treatment options. She is without any complaints. She was accompanied by a friend of hers. Her case was discussed at the multidisciplinary breast conference today with review of all of her pathology as well as radiology.   Past Medical History: Past Medical History  Diagnosis Date  . Arthritis   . Rheumatism   . Back pain   . Hypertension   . Pulse irregularity   . Breast cancer     Past Surgical History: Past Surgical History  Procedure Date  . Knee surgery     Left  . Shoulder surgery     Left    Family History: Family History  Problem Relation Age of Onset  . Diabetes Mother   . Other Mother     Bleeding disorder  . Diabetes Father   . Other Father     Bleeding disorder    Social History History  Substance Use Topics  . Smoking  status: Never Smoker   . Smokeless tobacco: Not on file  . Alcohol Use: No    Allergies: No Known Allergies  Current Medications: Current Outpatient Prescriptions  Medication Sig Dispense Refill  . Olmesartan-Amlodipine-HCTZ (TRIBENZOR) 40-5-25 MG TABS Take by mouth.      . prednisoLONE acetate (PRED FORTE) 1 % ophthalmic suspension Place 1 drop into the left eye 4 (four) times daily.      . vitamin C (ASCORBIC ACID) 500 MG tablet Take 500 mg by mouth daily.      Marland Kitchen aspirin 81 MG EC tablet Take 81 mg by mouth daily.        . Bimatoprost (LUMIGAN) 0.01 % SOLN Apply 1 drop to eye 3 (three) times daily.        . celecoxib (CELEBREX) 200 MG capsule Take 200 mg by mouth daily.        . Iron Combinations (IRON COMPLEX PO) Take by mouth daily.        Marland Kitchen latanoprost (XALATAN) 0.005 % ophthalmic solution Place 1 drop into both eyes.        . Multiple Vitamin (MULTIVITAMIN) tablet Take 1 tablet by mouth daily.        . rosuvastatin (CRESTOR) 5 MG tablet Take 5 mg by mouth daily.        . timolol (BETIMOL) 0.25 % ophthalmic solution Place 1 drop into the left eye 2 (two) times  daily.          OB/GYN History:Menarche at age 18 she underwent surgical menopause at 23 with a hysterectomy she has never been on hormone replacement therapy. She had 3 live births first birth was at age 62.  Fertility Discussion: Patient is postmenopausal Prior History of Cancer: No prior history of cancers  Health Maintenance:  Colonoscopy Patient is up-to-date on colonoscopies Bone Density She also tells me that she is up-to-date on bone density scan in. Last PAP smearUnknown  ECOG PERFORMANCE STATUS: 1 - Symptomatic but completely ambulatory  Genetic Counseling/testing: Not recommended at this time As there is no other family history of malignancies  REVIEW OF SYSTEMS:  Constitutional: positive for weight loss Ears, nose, mouth, throat, and face: positive for glaucoma and blurred vision Respiratory: positive  for dyspnea on exertion Cardiovascular: positive for fatigue and Murmur Gastrointestinal: negative Genitourinary:positive for history of bladder infection Integument/breast: positive for breast tenderness Hematologic/lymphatic: positive for patient has a history of blood transfusion Musculoskeletal:positive for arthralgias Neurological: negative  PHYSICAL EXAMINATION: Blood pressure 154/75, pulse 81, temperature 98.5 F (36.9 C), temperature source Oral, height 4' 10.5" (1.486 m), weight 115 lb (52.164 kg).  ZOX:WRUEA, healthy and cooperative SKIN: skin color, texture, turgor are normal HEAD: Normocephalic EYES: PERRLA, EOMI, sclera clear EARS: External ears normal OROPHARYNX:no exudate and no erythema  NECK: supple, no adenopathy LYMPH:  no palpable lymphadenopathy, no hepatosplenomegaly BREAST:Bilateral breast examination right breast reveals area of ecchymosis LUNGS: clear to auscultation  HEART: regular rate & rhythm and 2/6 murmur ABDOMEN:abdomen soft, non-tender, normal bowel sounds and no masses or organomegaly BACK: Back symmetric, no curvature. EXTREMITIES:no edema, no clubbing, no cyanosis  NEURO: no focal motor/sensory deficits     STUDIES/RESULTS: No results found.   LABS:    Chemistry      Component Value Date/Time   NA 140 10/09/2011 1230   K 4.9 10/09/2011 1230   CL 105 10/09/2011 1230   CO2 29 10/09/2011 1230   BUN 21 10/09/2011 1230   CREATININE 0.80 10/09/2011 1230      Component Value Date/Time   CALCIUM 9.9 10/09/2011 1230   ALKPHOS 61 10/09/2011 1230   AST 16 10/09/2011 1230   ALT 12 10/09/2011 1230   BILITOT 0.2* 10/09/2011 1230      Lab Results  Component Value Date   WBC 4.3 10/09/2011   HGB 11.7 10/09/2011   HCT 37.5 10/09/2011   MCV 72.4* 10/09/2011   PLT 140* 10/09/2011   PATHOLOGY: ADDITIONAL INFORMATION: PROGNOSTIC INDICATORS - ACIS Results IMMUNOHISTOCHEMICAL AND MORPHOMETRIC ANALYSIS BY THE AUTOMATED CELLULAR IMAGING SYSTEM  (ACIS) Estrogen Receptor (Negative, <1%): 100%,POSITIVE, STRONG STAINING INTENSITY Progesterone Receptor (Negative, <1%): 100%,POSITIVE, STRONG STAINING INTENSITY All controls stained appropriately Abigail Miyamoto MD Pathologist, Electronic Signature ( Signed 10/09/2011) FINAL DIAGNOSIS Diagnosis Breast, right, needle core biopsy, mass - LOW GRADE DUCTAL CARCINOMA IN SITU ARISING IN A BACKGROUND OF PAPILLOMA. PLEASE SEE COMMENT. Microscopic Comment The biopsies are fragmented and composed of multiple fragments of atypical proliferative ductal cells arranged in a papillary pattern. A battery of immunostains were performed and most of these fragments are devoid of myoepithelial markers p63 and Calponin Smooth muscle myosin highlights some small vessels in the papillary cores. These atypical cells are negative for cytokeratin 5/6 with appropriate internal controls . The overall findings are mostly consistent with low grade ductal carcinoma in situ arising in a background of papilloma. No stromal invasion or angiolymphatic invasion is identified in this material. Clinical and radiographic correlation  is highly recommended. The case was discussed with Dr. Tilda Burrow on 10/04/11. ER and PR will be performed and an addendum report will follow. (HCL:kh 10-04-11) 1 of 2 FINAL for CHANIQUE, DUCA [phone]) Microscopic Comment(continued) Abigail Miyamoto MD Pathologist, Electronic Signature (Case signed 10/04/2011) Specimen Gross and Clinical  ASSESSMENT    78 year old female with  #1 new diagnosis of low-grade ductal carcinoma in situ of the right breast after she had a screening mammogram performed that showed a mass in the right breast. The pathology showed a ER positive PR positive low grade ductal carcinoma in situ. Patient is seen in the multidisciplinary breast clinic for discussion of treatment options. Her case was discussed at the multidisciplinary breast conference her pathology  and radiology were reviewed extensively. Recommendation of breast conservation with lumpectomy was made. Because her tumor is estrogen receptor positive she would also be a candidate for antiestrogen therapy as chemopreventive. She and I discussed this extensively. However she will have her surgery first prior to getting antiestrogen therapy. Patient was seen by radiation oncology as well.     PLAN:    #1 patient will proceed with her surgery first. Once this is completed I will review her final pathology with her here at the cancer Center.  #2 I do think she would most likely be candidate for antiestrogen therapy only and there will be no need for radiation. All questions were answered today. I will plan on seeing her back in about 3-4 weeks' time.       Thank you so much for allowing me to participate in the care of Tyishia Lame. I will continue to follow up the patient with you and assist in her care.  All questions were answered. The patient knows to call the clinic with any problems, questions or concerns. We can certainly see the patient much sooner if necessary.  I spent 55 minutes counseling the patient face to face. The total time spent in the appointment was 60 minutes.  Drue Second, MD Medical/Oncology [facility] [phone] (beeper) [phone] (Office)  10/09/2011, 2:52 PM

## 2011-10-09 NOTE — Progress Notes (Signed)
Patient ID: Brianna Hunter, female   DOB: [birth year], 78 y.o.   MRN: [id number]  No chief complaint on file.   HPI Brianna Hunter is a 78 y.o. female.  HPI This patient is seen today in our multidisciplinary breast clinic for evaluation of a newly diagnosed right breast ductal carcinoma in situ found on routine annual screening mammogram. 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 She has a history of left breast cyst without any prior biopsy or treatment for this and she says that she has not ever had any problems noted on the right side. She denies any masses on self breast exam. She has had some weight loss but denies any hormone use or family history of breast cancer. She said that her mom had a history of what she thinks was pancreatic cancer and she died from this.  Past Medical History  Diagnosis Date  . Arthritis   . Rheumatism   . Back pain   . Hypertension   . Pulse irregularity   . Breast cancer     Past Surgical History  Procedure Date  . Knee surgery     Left  . Shoulder surgery     Left    Family History  Problem Relation Age of Onset  . Diabetes Mother   . Other Mother     Bleeding disorder  . Diabetes Father   . Other Father     Bleeding disorder    Social History History  Substance Use Topics  . Smoking status: Never Smoker   . Smokeless tobacco: Not on file  . Alcohol Use: No    No Known Allergies  Current Outpatient Prescriptions  Medication Sig Dispense Refill  . aspirin 81 MG EC tablet Take 81 mg by mouth daily.        . Bimatoprost (LUMIGAN) 0.01 % SOLN Apply 1 drop to eye 3 (three) times daily.        . celecoxib (CELEBREX) 200 MG capsule Take 200 mg by mouth daily.        . Iron Combinations (IRON COMPLEX PO) Take by mouth daily.        Marland Kitchen latanoprost (XALATAN) 0.005 % ophthalmic solution Place 1 drop into both eyes.        . Multiple  Vitamin (MULTIVITAMIN) tablet Take 1 tablet by mouth daily.        . Olmesartan-Amlodipine-HCTZ (TRIBENZOR) 40-5-25 MG TABS Take by mouth.      . prednisoLONE acetate (PRED FORTE) 1 % ophthalmic suspension Place 1 drop into the left eye 4 (four) times daily.      . rosuvastatin (CRESTOR) 5 MG tablet Take 5 mg by mouth daily.        . timolol (BETIMOL) 0.25 % ophthalmic solution Place 1 drop into the left eye 2 (two) times daily.        . vitamin C (ASCORBIC ACID) 500 MG tablet Take 500 mg by mouth daily.        Review of Systems Review of Systems All other review of systems negative or noncontributory except as stated in the HPI  There were no vitals taken for this visit.  Physical Exam Physical Exam Physical Exam  Nursing note and vitals reviewed. Constitutional: She is oriented to person, place, and time. She appears well-developed and well-nourished. No distress.  HENT:  Head: Normocephalic and atraumatic.  Mouth/Throat: No oropharyngeal exudate.  Eyes: Conjunctivae and EOM are normal. Pupils are equal, round, and reactive to light. Right eye exhibits no discharge. Left eye exhibits no discharge. No scleral icterus.  Neck: Normal range of motion. Neck supple. No tracheal deviation present.  Cardiovascular: Normal rate, regular rhythm, murmur noted, and intact distal pulses.   Pulmonary/Chest: Effort normal and breath sounds normal. No stridor. No respiratory distress. She has no wheezes.  She does have a mobile, 2 cm what appears to be epidermal inclusion cyst just inferior to her right clavicle on anterior chest wall. Abdominal: Soft. Bowel sounds are normal. She exhibits no distension and no mass. There is no tenderness. There is no rebound and no guarding.  Musculoskeletal: Normal range of motion. She exhibits no edema and no tenderness.  Neurological: She is alert and oriented to person, place, and time.  Skin: Skin is warm and dry. No rash noted. She is not diaphoretic. No  erythema. No pallor.  Psychiatric: She has a normal mood and affect. Her behavior is normal. Judgment and thought content normal.  Breasts: her left breast is normal without any suspicious skin changes, dominant masses, or lymphadenopathy. Her right breast has some biopsy site changes of in the inferior breast but no other dominant masses, skin changes, or lymphadenopathy.  Data Reviewed Mammo, [place], path  Assessment    Right breast ductal carcinoma in situ She does have some DCIS and we have recommended excision of this. We did discuss the options for treatment including lumpectomy versus mastectomy as well as the adjuvant treatment such as radiation, hormone, and chemotherapy. I think that she would be a good candidate for needle localized right breast lumpectomy and this should be adequate treatment. Since this is only DCIS and low-grade at that, I would not recommend any sentinel lymph node biopsy at this time. We discussed the procedures including the pros and cons and risks and benefits of this. We discussed the risks of infection, bleeding, pain, scarring, recurrence, and poor cosmesis, need for reexcision, and possible need for completion mastectomy or the need for sentinel lymph node biopsies if this returns with invasive cancer. She expressed understanding and would like to proceed with needle localized right breast lumpectomy. About 35 minutes was spent discussing the options with the patient    Plan    We will plan for needle localized right breast partial mastectomy when convenient for the patient and as soon as available.       Lodema Pilot DAVID 10/09/2011, 4:04 PM

## 2011-10-10 ENCOUNTER — Encounter: Payer: Self-pay | Admitting: Specialist

## 2011-10-10 ENCOUNTER — Encounter: Payer: Self-pay | Admitting: *Deleted

## 2011-10-10 NOTE — Progress Notes (Signed)
I met the patient in the multidisciplinary clinic and reviewed the distress screening tool with her.  She rated her distress as a "0", saying that her support and her faith were strong.  At her request, I made a referral to [facility].

## 2011-10-10 NOTE — Progress Notes (Signed)
Mailed after appt letter to pt. 

## 2011-10-14 ENCOUNTER — Telehealth: Payer: Self-pay | Admitting: *Deleted

## 2011-10-14 ENCOUNTER — Telehealth: Payer: Self-pay | Admitting: Oncology

## 2011-10-14 ENCOUNTER — Encounter: Payer: Self-pay | Admitting: *Deleted

## 2011-10-14 NOTE — Telephone Encounter (Signed)
lmonvm for pt on mobile re 10/2 appt. Per email from [facility] next available ok - no 1 mth availability. Not able to reach pt @ home # or lm. Pt was identified on mobile vm. Schedule mailed.

## 2011-10-14 NOTE — Telephone Encounter (Signed)
Spoke to pt concerning [facility] from 10/09/11.  Pt denies questions or concerns regarding dx or treatment care plan.  Encourage pt to call with needs.  Received verbal understanding.  Contact information given. 

## 2011-10-15 ENCOUNTER — Telehealth: Payer: Self-pay | Admitting: Oncology

## 2011-10-15 NOTE — Telephone Encounter (Signed)
lmonvm adviisng the pt of her r/s appt from oct to sept

## 2011-10-24 ENCOUNTER — Encounter (HOSPITAL_COMMUNITY): Payer: Self-pay | Admitting: Pharmacy Technician

## 2011-11-04 ENCOUNTER — Encounter (HOSPITAL_COMMUNITY)
Admission: RE | Admit: 2011-11-04 | Discharge: 2011-11-04 | Disposition: A | Discharge status: Home / Self Care | Payer: Medicare HMO | Source: Ambulatory Visit | Attending: General Surgery | Admitting: General Surgery

## 2011-11-04 ENCOUNTER — Encounter (HOSPITAL_COMMUNITY): Payer: Self-pay

## 2011-11-04 VITALS — BP 120/87 | HR 94 | Temp 98.7°F | Resp 20 | Ht <= 58 in | Wt 115.7 lb

## 2011-11-04 DIAGNOSIS — C50911 Malignant neoplasm of unspecified site of right female breast: Secondary | ICD-10-CM

## 2011-11-04 HISTORY — DX: Anxiety disorder, unspecified: F41.9

## 2011-11-04 LAB — CBC
HCT: 36.4 % (ref 36.0–46.0)
Hemoglobin: 11.8 g/dL — ABNORMAL LOW (ref 12.0–15.0)
MCV: 69.3 fL — ABNORMAL LOW (ref 78.0–100.0)
RDW: 14.5 % (ref 11.5–15.5)
WBC: 7 10*3/uL (ref 4.0–10.5)

## 2011-11-04 LAB — BASIC METABOLIC PANEL
CO2: 28 mEq/L (ref 19–32)
Chloride: 103 mEq/L (ref 96–112)
Creatinine, Ser: 0.76 mg/dL (ref 0.50–1.10)
GFR calc Af Amer: 89 mL/min — ABNORMAL LOW (ref >90)
Potassium: 4 mEq/L (ref 3.5–5.1)

## 2011-11-04 LAB — SURGICAL PCR SCREEN: MRSA, PCR: NEGATIVE

## 2011-11-04 NOTE — Progress Notes (Signed)
Pt. Reports that she has a lot on her, she states she is having a hard time getting use to being 82 yrs. Old, she feels like her mind is just "going". Pt. Reports that she has been helping with a relative with getting settled at [facility] & she remarks that she was recently treated for UTI, finished antibiotic.  Also reports that she has been having problems with her L eye, Dr. Harlon Flor is caring for that she has to have more done in near future but he is waiting for her to get the breast taken care of.

## 2011-11-04 NOTE — Progress Notes (Signed)
Call to [facility]- requested clarification of pt. appt. a ?[facility]. For DOS, pt. Unsure of schedule , left voicemail at surg. Scheduler desk.

## 2011-11-04 NOTE — Pre-Procedure Instructions (Signed)
20 Brianna Hunter  11/04/2011   Your procedure is scheduled on:  11/07/2011-  THURSDAY  Report to [facility] at 10:30 AM.  Call this number if you have problems the morning of surgery: [phone]   Remember:   Do not eat food or drink liquids :After Midnight.  Wednesday      Take these medicines the morning of surgery with A SIP OF WATER:   USE YOUR EYE DROPS   Do not wear jewelry, make-up or nail polish.  Do not wear lotions, powders, or perfumes. You may wear deodorant.  Do not shave 48 hours prior to surgery. Men may shave face and neck.  Do not bring valuables to the hospital.  Contacts, dentures or bridgework may not be worn into surgery.  Leave suitcase in the car. After surgery it may be brought to your room.  For patients admitted to the hospital, checkout time is 11:00 AM the day of discharge.   Patients discharged the day of surgery will not be allowed to drive home.  Name and phone number of your driver: /w family  Special Instructions: CHG Shower Use Special Wash: 1/2 bottle night before surgery and 1/2 bottle morning of surgery.   Please read over the following fact sheets that you were given: Pain Booklet, Coughing and Deep Breathing, MRSA Information and Surgical Site Infection Prevention

## 2011-11-06 MED ORDER — CEFAZOLIN SODIUM-DEXTROSE 2-3 GM-% IV SOLR
2.0000 g | INTRAVENOUS | Status: AC
  Administered 2011-11-07: 2 g via INTRAVENOUS

## 2011-11-07 ENCOUNTER — Ambulatory Visit (HOSPITAL_COMMUNITY)
Admission: RE | Admit: 2011-11-07 | Discharge: 2011-11-07 | Disposition: A | Discharge status: Home / Self Care | Payer: Medicare HMO | Source: Ambulatory Visit | Attending: General Surgery | Admitting: General Surgery

## 2011-11-07 ENCOUNTER — Encounter (HOSPITAL_COMMUNITY): Payer: Self-pay | Admitting: Anesthesiology

## 2011-11-07 ENCOUNTER — Encounter (HOSPITAL_COMMUNITY): Payer: Self-pay

## 2011-11-07 ENCOUNTER — Encounter (HOSPITAL_COMMUNITY)
Admission: RE | Disposition: A | Discharge status: Home / Self Care | Payer: Self-pay | Source: Ambulatory Visit | Attending: General Surgery

## 2011-11-07 ENCOUNTER — Ambulatory Visit (HOSPITAL_COMMUNITY): Payer: Medicare HMO | Admitting: Anesthesiology

## 2011-11-07 DIAGNOSIS — D0590 Unspecified type of carcinoma in situ of unspecified breast: Secondary | ICD-10-CM | POA: Insufficient documentation

## 2011-11-07 DIAGNOSIS — L723 Sebaceous cyst: Secondary | ICD-10-CM | POA: Insufficient documentation

## 2011-11-07 DIAGNOSIS — C50911 Malignant neoplasm of unspecified site of right female breast: Secondary | ICD-10-CM

## 2011-11-07 DIAGNOSIS — I10 Essential (primary) hypertension: Secondary | ICD-10-CM | POA: Insufficient documentation

## 2011-11-07 DIAGNOSIS — Z01812 Encounter for preprocedural laboratory examination: Secondary | ICD-10-CM | POA: Insufficient documentation

## 2011-11-07 HISTORY — PX: EAR CYST EXCISION: SHX22

## 2011-11-07 SURGERY — BREAST LUMPECTOMY WITH NEEDLE LOCALIZATION
Anesthesia: General | Site: Chest | Laterality: Right | Wound class: Clean

## 2011-11-07 MED ORDER — EPHEDRINE SULFATE 50 MG/ML IJ SOLN
INTRAMUSCULAR | Status: DC | PRN
  Administered 2011-11-07 (×2): 10 mg via INTRAVENOUS

## 2011-11-07 MED ORDER — SODIUM CHLORIDE 0.9 % IR SOLN
Status: DC | PRN
  Administered 2011-11-07: 1

## 2011-11-07 MED ORDER — CEFAZOLIN SODIUM-DEXTROSE 2-3 GM-% IV SOLR
INTRAVENOUS | Status: AC
  Filled 2011-11-07: qty 50

## 2011-11-07 MED ORDER — ONDANSETRON HCL 4 MG/2ML IJ SOLN
INTRAMUSCULAR | Status: DC | PRN
  Administered 2011-11-07: 4 mg via INTRAVENOUS

## 2011-11-07 MED ORDER — LACTATED RINGERS IV SOLN
INTRAVENOUS | Status: DC
  Administered 2011-11-07: 13:00:00 via INTRAVENOUS

## 2011-11-07 MED ORDER — LACTATED RINGERS IV SOLN
INTRAVENOUS | Status: DC | PRN
  Administered 2011-11-07: 13:00:00 via INTRAVENOUS

## 2011-11-07 MED ORDER — LIDOCAINE HCL (CARDIAC) 20 MG/ML IV SOLN
INTRAVENOUS | Status: DC | PRN
  Administered 2011-11-07: 80 mg via INTRAVENOUS

## 2011-11-07 MED ORDER — BUPIVACAINE HCL 0.25 % IJ SOLN
INTRAMUSCULAR | Status: DC | PRN
  Administered 2011-11-07: 20 mL

## 2011-11-07 MED ORDER — SODIUM CHLORIDE 0.9 % IJ SOLN: INTRAMUSCULAR | Status: DC | PRN

## 2011-11-07 MED ORDER — HYDROCODONE-ACETAMINOPHEN 5-325 MG PO TABS: 1.0000 | ORAL_TABLET | Freq: Four times a day (QID) | ORAL | Status: AC | PRN

## 2011-11-07 MED ORDER — LIDOCAINE-EPINEPHRINE 1 %-1:100000 IJ SOLN
INTRAMUSCULAR | Status: DC | PRN
  Administered 2011-11-07: 20 mL

## 2011-11-07 MED ORDER — PROPOFOL 10 MG/ML IV BOLUS
INTRAVENOUS | Status: DC | PRN
  Administered 2011-11-07 (×2): 100 mg via INTRAVENOUS

## 2011-11-07 MED ORDER — FENTANYL CITRATE 0.05 MG/ML IJ SOLN
INTRAMUSCULAR | Status: DC | PRN
  Administered 2011-11-07: 50 ug via INTRAVENOUS

## 2011-11-07 MED ORDER — METHYLENE BLUE 1 % INJ SOLN
INTRAMUSCULAR | Status: AC
  Filled 2011-11-07: qty 10

## 2011-11-07 MED ORDER — LIDOCAINE-EPINEPHRINE 1 %-1:100000 IJ SOLN
INTRAMUSCULAR | Status: AC
  Filled 2011-11-07: qty 1

## 2011-11-07 MED ORDER — BUPIVACAINE HCL (PF) 0.25 % IJ SOLN
INTRAMUSCULAR | Status: AC
  Filled 2011-11-07: qty 30

## 2011-11-07 MED ORDER — PROPOFOL 10 MG/ML IV EMUL: INTRAVENOUS | Status: DC | PRN

## 2011-11-07 SURGICAL SUPPLY — 61 items
ADH SKN CLS APL DERMABOND .7 (GAUZE/BANDAGES/DRESSINGS) ×2
APL SKNCLS STERI-STRIP NONHPOA (GAUZE/BANDAGES/DRESSINGS) ×2
APPLIER CLIP 9.375 SM OPEN (CLIP) ×3
APR CLP SM 9.3 20 MLT OPN (CLIP) ×2
BENZOIN TINCTURE PRP APPL 2/3 (GAUZE/BANDAGES/DRESSINGS) ×1 IMPLANT
BINDER BREAST LRG (GAUZE/BANDAGES/DRESSINGS) IMPLANT
BINDER BREAST MEDIUM (GAUZE/BANDAGES/DRESSINGS) ×1 IMPLANT
BINDER BREAST XLRG (GAUZE/BANDAGES/DRESSINGS) IMPLANT
BNDG COHESIVE 4X5 TAN STRL (GAUZE/BANDAGES/DRESSINGS) ×3 IMPLANT
CANISTER SUCTION 2500CC (MISCELLANEOUS) ×3 IMPLANT
CHLORAPREP W/TINT 26ML (MISCELLANEOUS) ×3 IMPLANT
CLIP APPLIE 9.375 SM OPEN (CLIP) ×2 IMPLANT
CLOTH BEACON ORANGE TIMEOUT ST (SAFETY) ×3 IMPLANT
CONT SPEC 4OZ CLIKSEAL STRL BL (MISCELLANEOUS) ×3 IMPLANT
COVER MAYO STAND STRL (DRAPES) ×3 IMPLANT
COVER PROBE W GEL 5X96 (DRAPES) ×2 IMPLANT
COVER SURGICAL LIGHT HANDLE (MISCELLANEOUS) ×3 IMPLANT
DECANTER SPIKE VIAL GLASS SM (MISCELLANEOUS) ×1 IMPLANT
DERMABOND ADVANCED (GAUZE/BANDAGES/DRESSINGS) ×1
DERMABOND ADVANCED .7 DNX12 (GAUZE/BANDAGES/DRESSINGS) ×2 IMPLANT
DEVICE DUBIN SPECIMEN MAMMOGRA (MISCELLANEOUS) ×1 IMPLANT
DRAPE LAPAROSCOPIC ABDOMINAL (DRAPES) ×3 IMPLANT
DRAPE UTILITY 15X26 W/TAPE STR (DRAPE) ×6 IMPLANT
DRSG PAD ABDOMINAL 8X10 ST (GAUZE/BANDAGES/DRESSINGS) ×1 IMPLANT
ELECT COATED BLADE 2.86 ST (ELECTRODE) ×3 IMPLANT
ELECT REM PT RETURN 9FT ADLT (ELECTROSURGICAL) ×3
ELECTRODE REM PT RTRN 9FT ADLT (ELECTROSURGICAL) ×2 IMPLANT
GAUZE SPONGE 2X2 8PLY STRL LF (GAUZE/BANDAGES/DRESSINGS) IMPLANT
GLOVE BIOGEL PI IND STRL 7.0 (GLOVE) IMPLANT
GLOVE BIOGEL PI INDICATOR 7.0 (GLOVE) ×1
GLOVE ECLIPSE 6.5 STRL STRAW (GLOVE) ×1 IMPLANT
GLOVE EXAM NITRILE MD LF STRL (GLOVE) ×1 IMPLANT
GLOVE SURG SS PI 7.5 STRL IVOR (GLOVE) ×6 IMPLANT
GOWN PREVENTION PLUS XLARGE (GOWN DISPOSABLE) ×3 IMPLANT
GOWN STRL NON-REIN LRG LVL3 (GOWN DISPOSABLE) ×3 IMPLANT
KIT BASIN OR (CUSTOM PROCEDURE TRAY) ×3 IMPLANT
KIT ROOM TURNOVER OR (KITS) ×3 IMPLANT
NDL 18GX1X1/2 (RX/OR ONLY) (NEEDLE) ×2 IMPLANT
NDL HYPO 25GX1X1/2 BEV (NEEDLE) ×4 IMPLANT
NEEDLE 18GX1X1/2 (RX/OR ONLY) (NEEDLE) ×3 IMPLANT
NEEDLE HYPO 25GX1X1/2 BEV (NEEDLE) ×6 IMPLANT
NS IRRIG 1000ML POUR BTL (IV SOLUTION) ×3 IMPLANT
PACK GENERAL/GYN (CUSTOM PROCEDURE TRAY) ×3 IMPLANT
PAD ARMBOARD 7.5X6 YLW CONV (MISCELLANEOUS) ×6 IMPLANT
SPECIMEN JAR MEDIUM (MISCELLANEOUS) ×1 IMPLANT
SPONGE GAUZE 2X2 STER 10/PKG (GAUZE/BANDAGES/DRESSINGS) ×1
SPONGE GAUZE 4X4 12PLY (GAUZE/BANDAGES/DRESSINGS) IMPLANT
STOCKINETTE IMPERVIOUS 9X36 MD (GAUZE/BANDAGES/DRESSINGS) ×3 IMPLANT
STRIP CLOSURE SKIN 1/2X4 (GAUZE/BANDAGES/DRESSINGS) ×1 IMPLANT
SUT ETHILON 3 0 FSL (SUTURE) IMPLANT
SUT MON AB 4-0 PC3 18 (SUTURE) ×4 IMPLANT
SUT SILK 2 0 SH (SUTURE) ×3 IMPLANT
SUT VIC AB 3-0 54X BRD REEL (SUTURE) ×2 IMPLANT
SUT VIC AB 3-0 BRD 54 (SUTURE) ×3
SUT VIC AB 3-0 SH 27 (SUTURE) ×3
SUT VIC AB 3-0 SH 27X BRD (SUTURE) IMPLANT
SUT VIC AB 3-0 SH 8-18 (SUTURE) ×3 IMPLANT
SYR CONTROL 10ML LL (SYRINGE) ×6 IMPLANT
TAPE CLOTH SURG 6X10 WHT LF (GAUZE/BANDAGES/DRESSINGS) ×1 IMPLANT
TOWEL OR 17X24 6PK STRL BLUE (TOWEL DISPOSABLE) ×3 IMPLANT
TOWEL OR 17X26 10 PK STRL BLUE (TOWEL DISPOSABLE) ×3 IMPLANT

## 2011-11-07 NOTE — Progress Notes (Signed)
Pt gone to or. Pharmacy to update med red due to patient's difficulty remembering eye drops one was left off. Unsure of which eye drops she took this am. She states that she took one with a navy blue top but pharmacy unable to identify which drop by the color of top and Patient did not have that one with her.

## 2011-11-07 NOTE — Op Note (Signed)
Brianna Hunter, Brianna Hunter           ACCOUNT NO.:  [id number]  MEDICAL RECORD NO.:  [id number]  LOCATION:  [facility]                         FACILITY:  [facility]  PHYSICIAN:  Lodema Pilot, MD       DATE OF BIRTH:  Mar 28, 1928  DATE OF PROCEDURE:  11/07/2011 DATE OF DISCHARGE:  11/07/2011                              OPERATIVE REPORT   PROCEDURE:  Needle-localized right breast partial mastectomy and excision of right chest wall mass.  PREOPERATIVE DIAGNOSIS:  Right breast ductal carcinoma in situ and sebaceous cyst.  POSTOPERATIVE DIAGNOSIS:  Right breast ductal carcinoma in situ and sebaceous cyst.  SURGEON:  Lodema Pilot, MD  ASSISTANT:  None.  ANESTHESIA:  General LMA anesthesia with 35 mL of 1% lidocaine with epinephrine and 0.25% Marcaine in a 50:50 mixture.  FLUID:  700 mL crystalloid.  ESTIMATED BLOOD LOSS:  Minimal.  DRAINS:  None.  SPECIMENS: 1. Right needle localized lumpectomy sent to Pathology for permanent     sectioning with a short stitch marking the superior margin, a long     stitch marking the lateral margin. 2. Additional inferior margin with the suture marking the new inferior     margin. 3. 2.5 cm x 1.5 cm epidermal inclusion cyst sent to Pathology for     permanent section.  COMPLICATIONS:  None apparent.  FINDINGS:  Successful excision of the previously placed biopsy clip as confirmed by Dr. Anne Hahn at [facility].  I took additional inferior margin, does on the x-rays there appeared to be the closest area to the biopsy clip.  INDICATION FOR PROCEDURE:  Ms. Hurtado is an 78 year old female with A newly diagnosed low-grade DCIS, right breast, in need of surgery for treatment.  OPERATIVE DETAILS:  Ms. Weisenburger was seen and evaluated in the preoperative area, and risks and benefits of the procedure were again discussed in lay terms.  Informed consent was obtained.  She had already had wire localization of the biopsy clip and DCIS, but I had  marked the area of the skin cyst that she wanted excised as well.  She was then taken to the operating room, placed on table in supine position, and general LMA anesthesia obtained.  Her right breast and chest were prepped and draped in a standard surgical fashion.  Procedure time-out was performed with all operative team members to confirm proper patient, procedure, and then a curvilinear incision was made between the wire and the nipple-areolar complex over the anticipated area of the lesion and breast flaps were created circumferentially.  Then, I carried use posteriorly around the anticipated tract of the guidewire down approaching the chest wall.  After I dissected circumferentially, I delivered the wire into the wound and delivered the specimen up through the incision and undermined the lesion.  I placed the short silk stitch on the superior margin and a long silk stitch on the lateral margin, and we x-rayed the specimen in the operating room, and the biopsy clip was in the specimen.  Dr. Anne Hahn from [facility] confirmed that the biopsy clip was indeed retrieved as well.  Looking at the specimen in the area of the clip, I thought that if any of the margins that would  be close would be the inferior margin, and I took an additional 1 cm of tissue from the anterior margin and sent this as specimen #2 with the suture marking the new inferior margin.  The wound was inspected for hemostasis, which was obtained with Bovie electrocautery, and the wound was irrigated with sterile saline solution.  Clips were placed at the 12 o'clock, 3 o'clock, 6 o'clock, and 9 o'clock position in the breast, and the wound was again noted to be hemostatic.  Then, the dermis was approximated with interrupted 3-0 Vicryl sutures and prior to securing the final 2 sutures.  The wound was filled with 35 mL of 1% lidocaine with epinephrine and 0.25% Marcaine in a 50:50 mixture.  Final sutures were secured for a  watertight closure.  Skin edges were approximated with 4-0 Monocryl subcuticular suture, and the skin was washed and dried and Dermabond was applied.  After the Dermabond was dried and the wound occluded, I proceeded with excision of the epidermal inclusion cyst.  An elliptical incision was made around the cyst and dissection carried out around the cyst circumferentially down into subcutaneous tissue.  The cyst capsule was not entered, and there was no spillage or contamination.  After the lesion was circumferentially divided, undermined the lesion with Bovie electrocautery and sent to specimen to Pathology for permanent sectioning.  It measured sutures 2.5 cm long x 1.5 cm wide.  The wound was noted be hemostatic and it was irrigated with sterile saline solution, and the dermis was approximated with interrupted 3-0 Vicryl sutures, and the skin edges were approximated with 4-0 Monocryl subcuticular suture.  Skin was washed and dried. Benzoin and Steri-Strips were applied, and sterile dressing were applied.  All sponge, needle, and instrument counts correct at the end of the case.  The patient tolerated the procedure well without apparent complication.          ______________________________ Lodema Pilot, MD     BL/MEDQ  D:  11/07/2011  T:  11/07/2011  Job:  [id number]

## 2011-11-07 NOTE — Transfer of Care (Signed)
Immediate Anesthesia Transfer of Care Note  Patient: Brianna Hunter  Procedure(s) Performed: Procedure(s) (LRB): BREAST LUMPECTOMY WITH NEEDLE LOCALIZATION (Right) CYST REMOVAL (Right)  Patient Location: PACU  Anesthesia Type: General  Level of Consciousness: awake, alert , oriented and patient cooperative  Airway & Oxygen Therapy: Patient Spontanous Breathing and Patient connected to nasal cannula oxygen  Post-op Assessment: Report given to PACU RN, Post -op Vital signs reviewed and stable and Patient moving all extremities X 4  Post vital signs: Reviewed and stable  Complications: No apparent anesthesia complications

## 2011-11-07 NOTE — H&P (View-Only) (Signed)
Patient ID: Brianna Hunter, female   DOB: 12/19/1928, 78 y.o.   MRN: [id number]  No chief complaint on file.   HPI Brianna Hunter is a 78 y.o. female.  HPI This patient is seen today in our multidisciplinary breast clinic for evaluation of a newly diagnosed right breast ductal carcinoma in situ found on routine annual screening mammogram. 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 She has a history of left breast cyst without any prior biopsy or treatment for this and she says that she has not ever had any problems noted on the right side. She denies any masses on self breast exam. She has had some weight loss but denies any hormone use or family history of breast cancer. She said that her mom had a history of what she thinks was pancreatic cancer and she died from this.  Past Medical History  Diagnosis Date  . Arthritis   . Rheumatism   . Back pain   . Hypertension   . Pulse irregularity   . Breast cancer     Past Surgical History  Procedure Date  . Knee surgery     Left  . Shoulder surgery     Left    Family History  Problem Relation Age of Onset  . Diabetes Mother   . Other Mother     Bleeding disorder  . Diabetes Father   . Other Father     Bleeding disorder    Social History History  Substance Use Topics  . Smoking status: Never Smoker   . Smokeless tobacco: Not on file  . Alcohol Use: No    No Known Allergies  Current Outpatient Prescriptions  Medication Sig Dispense Refill  . aspirin 81 MG EC tablet Take 81 mg by mouth daily.        . Bimatoprost (LUMIGAN) 0.01 % SOLN Apply 1 drop to eye 3 (three) times daily.        . celecoxib (CELEBREX) 200 MG capsule Take 200 mg by mouth daily.        . Iron Combinations (IRON COMPLEX PO) Take by mouth daily.        . latanoprost (XALATAN) 0.005 % ophthalmic solution Place 1 drop into both eyes.        . Multiple  Vitamin (MULTIVITAMIN) tablet Take 1 tablet by mouth daily.        . Olmesartan-Amlodipine-HCTZ (TRIBENZOR) 40-5-25 MG TABS Take by mouth.      . prednisoLONE acetate (PRED FORTE) 1 % ophthalmic suspension Place 1 drop into the left eye 4 (four) times daily.      . rosuvastatin (CRESTOR) 5 MG tablet Take 5 mg by mouth daily.        . timolol (BETIMOL) 0.25 % ophthalmic solution Place 1 drop into the left eye 2 (two) times daily.        . vitamin C (ASCORBIC ACID) 500 MG tablet Take 500 mg by mouth daily.        Review of Systems Review of Systems All other review of systems negative or noncontributory except as stated in the HPI  There were no vitals taken for this visit.  Physical Exam Physical Exam Physical Exam  Nursing note and vitals reviewed. Constitutional: She is oriented to person, place, and time. She appears well-developed and well-nourished. No distress.  HENT:  Head: Normocephalic and atraumatic.  Mouth/Throat: No oropharyngeal exudate.    Eyes: Conjunctivae and EOM are normal. Pupils are equal, round, and reactive to light. Right eye exhibits no discharge. Left eye exhibits no discharge. No scleral icterus.  Neck: Normal range of motion. Neck supple. No tracheal deviation present.  Cardiovascular: Normal rate, regular rhythm, murmur noted, and intact distal pulses.   Pulmonary/Chest: Effort normal and breath sounds normal. No stridor. No respiratory distress. She has no wheezes.  She does have a mobile, 2 cm what appears to be epidermal inclusion cyst just inferior to her right clavicle on anterior chest wall. Abdominal: Soft. Bowel sounds are normal. She exhibits no distension and no mass. There is no tenderness. There is no rebound and no guarding.  Musculoskeletal: Normal range of motion. She exhibits no edema and no tenderness.  Neurological: She is alert and oriented to person, place, and time.  Skin: Skin is warm and dry. No rash noted. She is not diaphoretic. No  erythema. No pallor.  Psychiatric: She has a normal mood and affect. Her behavior is normal. Judgment and thought content normal.  Breasts: her left breast is normal without any suspicious skin changes, dominant masses, or lymphadenopathy. Her right breast has some biopsy site changes of in the inferior breast but no other dominant masses, skin changes, or lymphadenopathy.  Data Reviewed Mammo, [place], path  Assessment    Right breast ductal carcinoma in situ She does have some DCIS and we have recommended excision of this. We did discuss the options for treatment including lumpectomy versus mastectomy as well as the adjuvant treatment such as radiation, hormone, and chemotherapy. I think that she would be a good candidate for needle localized right breast lumpectomy and this should be adequate treatment. Since this is only DCIS and low-grade at that, I would not recommend any sentinel lymph node biopsy at this time. We discussed the procedures including the pros and cons and risks and benefits of this. We discussed the risks of infection, bleeding, pain, scarring, recurrence, and poor cosmesis, need for reexcision, and possible need for completion mastectomy or the need for sentinel lymph node biopsies if this returns with invasive cancer. She expressed understanding and would like to proceed with needle localized right breast lumpectomy. About 35 minutes was spent discussing the options with the patient    Plan    We will plan for needle localized right breast partial mastectomy when convenient for the patient and as soon as available.       Yemariam Magar DAVID 10/09/2011, 4:04 PM    

## 2011-11-07 NOTE — Interval H&P Note (Signed)
History and Physical Interval Note:  11/07/2011 1:03 PM  Brianna Hunter  has presented today for surgery, with the diagnosis of Right low grade DCIS  The various methods of treatment have been discussed with the patient and family. After consideration of risks, benefits and other options for treatment, the patient has consented to  Procedure(s) (LRB): BREAST LUMPECTOMY WITH NEEDLE LOCALIZATION (Right) CYST REMOVAL (Right) as a surgical intervention .  The patient's history has been reviewed, patient examined, no change in status, stable for surgery.  I have reviewed the patient's chart and labs.  Questions were answered to the patient's satisfaction.  Seen in preop area.  Site marked.  Needle in place.  She denies any changes.  She also has a right upper breast/chest EDIC which she would like removed.  We will excise this as well.  The risks of infection, bleeding, pain, scarring, poor cosmesis, recurrence, need for re-excision or future surgery, nerve injury and lymphedema again discussed with the patient. Will plan for right needle loc lumpectomy with excision of right chest cyst.    Lodema Pilot DAVID

## 2011-11-07 NOTE — Anesthesia Preprocedure Evaluation (Addendum)
Anesthesia Evaluation  Patient identified by MRN, date of birth, ID band  Reviewed: Allergy & Precautions, H&P , NPO status   Airway Mallampati: I TM Distance: >3 FB Neck ROM: Full    Dental  (+) Dental Advisory Given and Poor Dentition   Pulmonary          Cardiovascular hypertension,     Neuro/Psych    GI/Hepatic   Endo/Other    Renal/GU      Musculoskeletal   Abdominal   Peds  Hematology   Anesthesia Other Findings   Reproductive/Obstetrics                          Anesthesia Physical Anesthesia Plan  ASA: II  Anesthesia Plan: General   Post-op Pain Management:    Induction: Intravenous  Airway Management Planned: LMA  Additional Equipment:   Intra-op Plan:   Post-operative Plan: Extubation in OR  Informed Consent: I have reviewed the patients History and Physical, chart, labs and discussed the procedure including the risks, benefits and alternatives for the proposed anesthesia with the patient or authorized representative who has indicated his/her understanding and acceptance.     Plan Discussed with: CRNA and Surgeon  Anesthesia Plan Comments:         Anesthesia Quick Evaluation

## 2011-11-08 ENCOUNTER — Encounter (HOSPITAL_COMMUNITY): Payer: Self-pay | Admitting: General Surgery

## 2011-11-08 ENCOUNTER — Telehealth (INDEPENDENT_AMBULATORY_CARE_PROVIDER_SITE_OTHER): Payer: Self-pay | Admitting: General Surgery

## 2011-11-08 NOTE — Anesthesia Postprocedure Evaluation (Signed)
Anesthesia Post Note  Patient: Brianna Hunter  Procedure(s) Performed: Procedure(s) (LRB): BREAST LUMPECTOMY WITH NEEDLE LOCALIZATION (Right) CYST REMOVAL (Right)  Anesthesia type: general  Patient location: PACU  Post pain: Pain level controlled  Post assessment: Patient's Cardiovascular Status Stable  Last Vitals:  Filed Vitals:   11/07/11 1536  BP: 147/76  Pulse: 67  Temp: 36.1 C  Resp: 16    Post vital signs: Reviewed and stable  Level of consciousness: sedated  Complications: No apparent anesthesia complications

## 2011-11-08 NOTE — Telephone Encounter (Signed)
Pt calling because her granddaughter wants her to ride-along when she goes to [facility] today.  Pt had surgery yesterday for lumpectomy and needle loc.  Advised pt not to make this trip today, as she would be quite uncomfortable and needed to rest.  She understands and prefers to tell granddaughter that "the doctor's office told her she can't go!"

## 2011-11-20 ENCOUNTER — Telehealth (INDEPENDENT_AMBULATORY_CARE_PROVIDER_SITE_OTHER): Payer: Self-pay

## 2011-11-20 NOTE — Telephone Encounter (Signed)
Patient called in regarding prescription or support bra. I asked Neysa Bonito if she had this for Dr. Biagio Quint to review next week. She will look into it and if not se if another doctor would sign off on it. I gave her follow up appointment information.

## 2011-11-25 ENCOUNTER — Ambulatory Visit (HOSPITAL_BASED_OUTPATIENT_CLINIC_OR_DEPARTMENT_OTHER): Payer: Medicare HMO | Admitting: Oncology

## 2011-11-25 ENCOUNTER — Ambulatory Visit: Payer: Medicare FFS | Admitting: Oncology

## 2011-11-25 ENCOUNTER — Encounter: Payer: Self-pay | Admitting: Oncology

## 2011-11-25 VITALS — BP 136/74 | HR 82 | Temp 98.3°F | Resp 20 | Ht <= 58 in | Wt 116.3 lb

## 2011-11-25 DIAGNOSIS — Z170 Estrogen receptor positive status [ER+]: Secondary | ICD-10-CM

## 2011-11-25 DIAGNOSIS — D0590 Unspecified type of carcinoma in situ of unspecified breast: Secondary | ICD-10-CM

## 2011-11-25 DIAGNOSIS — C50319 Malignant neoplasm of lower-inner quadrant of unspecified female breast: Secondary | ICD-10-CM

## 2011-11-25 MED ORDER — TAMOXIFEN CITRATE 20 MG PO TABS: 20.0000 mg | ORAL_TABLET | Freq: Every day | ORAL | Status: DC

## 2011-11-25 NOTE — Progress Notes (Signed)
OFFICE PROGRESS NOTE  CC  Brianna Pitter, MD [address] [place] [address] Dr. Lodema Pilot Dr. Lonie Peak  DIAGNOSIS: 78 year old female status post right lumpectomy for DCIS.  PRIOR THERAPY:  #1 patient was originally seen in the multidisciplinary breast clinic with Dr. Lodema Pilot and Dr. Lonie Peak. She was her diagnosed with DCIS of the right breast. Since then she has undergone a right breast needle localized lumpectomy. The final pathology revealed a 4 mm focus of DCIS with negative margins. Tumor was estrogen receptor positive.  #2 patient will now chemoprevention with tamoxifen 20 mg daily. Risks and benefits of this were discussed with the patient. A total of 5 years of therapy is planned.   CURRENT THERAPY: tamoxifen 20 mg daily starting 11/25/2011   INTERVAL HISTORY: Brianna Hunter 78 y.o. female returns for followup visit post mastectomy. Overall she is doing well she tolerated the lumpectomy very well without any significant problems. She denies any fevers chills night sweats headaches shortness of breath chest pains palpitations. Patient and I discussed her pathology today as well. We discussed chemoprevention with tamoxifen only. She understands she will receive a total of 5 years. She understands the risks.   MEDICAL HISTORY: Past Medical History  Diagnosis Date  . Rheumatism   . Back pain   . Hypertension   . Pulse irregularity   . Breast cancer   . Anxiety   . Arthritis     celebrex use off & on for arthritis- hands, back   . Heart murmur     echo done 09/2010 /w [place], next appt. coming up    ALLERGIES:  is allergic to milk-related compounds.  MEDICATIONS:  Current Outpatient Prescriptions  Medication Sig Dispense Refill  . aspirin 81 MG EC tablet Take 81 mg by mouth daily.        . Bimatoprost (LUMIGAN) 0.01 % SOLN Place 1 drop into the right eye 3 (three) times daily.       . brimonidine (ALPHAGAN) 0.15 % ophthalmic solution  Place 1 drop into the right eye 3 (three) times daily.      . celecoxib (CELEBREX) 200 MG capsule Take 200 mg by mouth at bedtime.       . dorzolamide (TRUSOPT) 2 % ophthalmic solution Place 1 drop into the right eye 3 (three) times daily.      . ferrous gluconate (FERGON) 325 MG tablet Take 325 mg by mouth daily with breakfast.      . gentamicin (GENTAK) 0.3 % ophthalmic solution Place 1 drop into the left eye 3 (three) times daily.      . Multiple Vitamin (MULTIVITAMIN) tablet Take 1 tablet by mouth daily.        . Olmesartan-Amlodipine-HCTZ (TRIBENZOR) 40-5-25 MG TABS Take 1 tablet by mouth daily after breakfast.       . Polyethyl Glycol-Propyl Glycol (SYSTANE) 0.4-0.3 % SOLN Apply 2 drops to eye 2 (two) times daily as needed. For dry eyes      . prednisoLONE acetate (PRED FORTE) 1 % ophthalmic suspension Place 1 drop into the left eye 4 (four) times daily.      . vitamin C (ASCORBIC ACID) 500 MG tablet Take 500 mg by mouth daily.        SURGICAL HISTORY:  Past Surgical History  Procedure Date  . Knee surgery     Left  . Shoulder surgery     Left  . Joint replacement     2008- L  knee  . Appendectomy   . Abdominal hysterectomy     partial   . Eye surgery     left eye 2013  . Ear cyst excision 11/07/2011    Procedure: CYST REMOVAL;  Surgeon: Lodema Pilot, DO;  Location: [facility] OR;  Service: General;  Laterality: Right;  Removal Cyst Right Upper Chest    REVIEW OF SYSTEMS:  Pertinent items are noted in HPI.   PHYSICAL EXAMINATION:  Well-developed well-nourished female appearing younger than her stated age. HEENT exam is my PERRLA sclerae anicteric no conjunctival pallor oral mucosa is moist neck is supple lungs are clear bilaterally cardiovascular is regular rate rhythm with 2/6 murmur abdomen is soft nontender nondistended bowel sounds are present no HSM extremities no edema neuro patient's alert oriented otherwise nonfocal. Right breast healing incisional scar no nipple discharge left  breast no masses or nipple discharge.  ECOG PERFORMANCE STATUS: 0 - Asymptomatic  Blood pressure 136/74, pulse 82, temperature 98.3 F (36.8 C), temperature source Oral, resp. rate 20, height 4\' 9"  (1.448 m), weight 116 lb 4.8 oz (52.753 kg).  LABORATORY DATA: Lab Results  Component Value Date   WBC 7.0 11/04/2011   HGB 11.8* 11/04/2011   HCT 36.4 11/04/2011   MCV 69.3* 11/04/2011   PLT 143* 11/04/2011      Chemistry      Component Value Date/Time   NA 140 11/04/2011 1615   K 4.0 11/04/2011 1615   CL 103 11/04/2011 1615   CO2 28 11/04/2011 1615   BUN 28* 11/04/2011 1615   CREATININE 0.76 11/04/2011 1615      Component Value Date/Time   CALCIUM 10.1 11/04/2011 1615   ALKPHOS 61 10/09/2011 1230   AST 16 10/09/2011 1230   ALT 12 10/09/2011 1230   BILITOT 0.2* 10/09/2011 1230     FINAL DIAGNOSIS Diagnosis 1. Breast, lumpectomy, Right - DUCTAL CARCINOMA IN SITU WITH CALCIFICATIONS, INTERMEDIATE GRADE, SPANNING 0.4 CM. - THE SURGICAL RESECTION MARGINS ARE NEGATIVE FOR CARCINOMA. - SEE ONCOLOGY TABLE BELOW. 2. Breast, excision, additional inferior margin - BENIGN BREAST PARENCHYMA. - THERE IS NO EVIDENCE OF MALIGNANCY. - SEE COMMENT. 3. Skin , Chest cyst - EPIDERMOID INCLUSION CYST. - THERE IS NO EVIDENCE OF MALIGNANCY. Microscopic Comment 1. BREAST, IN SITU CARCINOMA Specimen, including laterality: Right breast Procedure: Needle localized lumpectomy Grade of carcinoma: Intermediate grade Necrosis: Not identified Estimated tumor size: (glass slide measurement): 0.4 cm Treatment effect: N/A Distance to closest margin: Greater than 0.2 cm to all margins (gross measurements). Breast prognostic profile: Case [id number] Estrogen receptor: 100%, strong staining intensity. Progesterone receptor: 100%, strong staining intensity Lymph nodes: None examined TNM: pTis, pNX Comments: In addition to the ductal carcinoma in situ involving a papilloma, there is a  fibroadenoma, fibrocystic changes and vascular calcifications. (JBK:kh 11-12-11) 2. The surgical resection m  RADIOGRAPHIC STUDIES:  No results found.  ASSESSMENT: 15-year-old female with  #1 stage 0 (DCIS) of the right breast status post needle.wire localized lumpectomy with the final pathology revealing a 4 mm focus of DCIS that was ER positive. Postoperatively she is doing well without any problems. Recommendation is chemoprevention with tamoxifen risks and benefits of this were discussed with the patient thoroughly. She understands that she would like to do whatever she can to keep her from getting another cancer.    PLAN:   #1 tamoxifen 20 mg daily.  #2 patient will be seen back in 3 months time for followup.   All questions were answered. The patient knows to  call the clinic with any problems, questions or concerns. We can certainly see the patient much sooner if necessary.  I spent 25 minutes counseling the patient face to face. The total time spent in the appointment was 30 minutes.    Drue Second, MD Medical/Oncology [facility] [phone] (beeper) [phone] (Office)  11/25/2011, 3:31 PM

## 2011-11-25 NOTE — Patient Instructions (Addendum)
Proceed with tamoxifen 20 mg daily  I will see you in November 2013  Information on tamoxifen as below  Tamoxifen oral tablet What is this medicine? TAMOXIFEN (ta MOX i fen) blocks the effects of estrogen. It is commonly used to treat breast cancer. It is also used to decrease the chance of breast cancer coming back in women who have received treatment for the disease. It may also help prevent breast cancer in women who have a high risk of developing breast cancer. This medicine may be used for other purposes; ask your health care provider or pharmacist if you have questions. What should I tell my health care provider before I take this medicine? They need to know if you have any of these conditions: -blood clots -blood disease -cataracts or impaired eyesight -endometriosis -high calcium levels -high cholesterol -irregular menstrual cycles -liver disease -stroke -uterine fibroids -an unusual or allergic reaction to tamoxifen, other medicines, foods, dyes, or preservatives -pregnant or trying to get pregnant -breast-feeding How should I use this medicine? Take this medicine by mouth with a glass of water. Follow the directions on the prescription label. You can take it with or without food. Take your medicine at regular intervals. Do not take your medicine more often than directed. Do not stop taking except on your doctor's advice. A special MedGuide will be given to you by the pharmacist with each prescription and refill. Be sure to read this information carefully each time. Talk to your pediatrician regarding the use of this medicine in children. While this drug may be prescribed for selected conditions, precautions do apply. Overdosage: If you think you have taken too much of this medicine contact a poison control center or emergency room at once. NOTE: This medicine is only for you. Do not share this medicine with others. What if I miss a dose? If you miss a dose, take it as soon  as you can. If it is almost time for your next dose, take only that dose. Do not take double or extra doses. What may interact with this medicine? -aminoglutethimide -bromocriptine -chemotherapy drugs -female hormones, like estrogens and birth control pills -letrozole -medroxyprogesterone -phenobarbital -rifampin -warfarin This list may not describe all possible interactions. Give your health care provider a list of all the medicines, herbs, non-prescription drugs, or dietary supplements you use. Also tell them if you smoke, drink alcohol, or use illegal drugs. Some items may interact with your medicine. What should I watch for while using this medicine? Visit your doctor or health care professional for regular checks on your progress. You will need regular pelvic exams, breast exams, and mammograms. If you are taking this medicine to reduce your risk of getting breast cancer, you should know that this medicine does not prevent all types of breast cancer. If breast cancer or other problems occur, there is no guarantee that it will be found at an early stage. Do not become pregnant while taking this medicine or for 2 months after stopping this medicine. Stop taking this medicine if you get pregnant or think you are pregnant and contact your doctor. This medicine may harm your unborn baby. Women who can possibly become pregnant should use birth control methods that do not use hormones during tamoxifen treatment and for 2 months after therapy has stopped. Talk with your health care provider for birth control advice. Do not breast feed while taking this medicine. What side effects may I notice from receiving this medicine? Side effects that you should report  to your doctor or health care professional as soon as possible: -changes in vision (blurred vision) -changes in your menstrual cycle -difficulty breathing or shortness of breath -difficulty walking or talking -new breast  lumps -numbness -pelvic pain or pressure -redness, blistering, peeling or loosening of the skin, including inside the mouth -skin rash or itching (hives) -sudden chest pain -swelling of lips, face, or tongue -swelling, pain or tenderness in your calf or leg -unusual bruising or bleeding -vaginal discharge that is bloody, brown, or rust -weakness -yellowing of the whites of the eyes or skin Side effects that usually do not require medical attention (report to your doctor or health care professional if they continue or are bothersome): -fatigue -hair loss, although uncommon and is usually mild -headache -hot flashes -impotence (in men) -nausea, vomiting (mild) -vaginal discharge (white or clear) This list may not describe all possible side effects. Call your doctor for medical advice about side effects. You may report side effects to FDA at 1-800-FDA-1088. Where should I keep my medicine? Keep out of the reach of children. Store at room temperature between 20 and 25 degrees C (68 and 77 degrees F). Protect from light. Keep container tightly closed. Throw away any unused medicine after the expiration date. NOTE: This sheet is a summary. It may not cover all possible information. If you have questions about this medicine, talk to your doctor, pharmacist, or health care provider.  2012, [employer]. (11/12/2007 12:01:56 PM)

## 2011-11-26 ENCOUNTER — Telehealth: Payer: Self-pay | Admitting: *Deleted

## 2011-11-26 NOTE — Telephone Encounter (Signed)
Gave patient appointment for 01-21-2012 starting at 10:00am

## 2011-11-28 ENCOUNTER — Ambulatory Visit (INDEPENDENT_AMBULATORY_CARE_PROVIDER_SITE_OTHER): Payer: Medicare HMO | Admitting: General Surgery

## 2011-11-28 ENCOUNTER — Encounter (INDEPENDENT_AMBULATORY_CARE_PROVIDER_SITE_OTHER): Payer: Self-pay | Admitting: General Surgery

## 2011-11-28 VITALS — BP 136/68 | HR 80 | Temp 99.2°F | Resp 16 | Ht <= 58 in | Wt 115.2 lb

## 2011-11-28 DIAGNOSIS — Z4889 Encounter for other specified surgical aftercare: Secondary | ICD-10-CM

## 2011-11-28 DIAGNOSIS — Z5189 Encounter for other specified aftercare: Secondary | ICD-10-CM

## 2011-11-28 NOTE — Progress Notes (Signed)
Subjective:     Patient ID: Brianna Hunter, female   DOB: 1929-02-05, 78 y.o.   MRN: [id number]  HPI This patient follows up status post right breast needle localized lumpectomy for treatment of DCIS. We also excised an epidural inclusion cyst at that time. She is doing well from her surgery and has no complaints. She has no discomfort in the area. She has followed up already with her oncologist and she is planning on antiestrogen therapy. Her pathology was consistent with a 4 mm focus of DCIS with negative margins.  Review of Systems     Objective:   Physical Exam No acute distress and nontoxic-appearing Her incisions are healing well without sign of infection. She has good cosmesis. There is no evidence of recurrence on exam.    Assessment:     Status post right needle localized lumpectomy for DCIS-doing well Her incisions are healing well without sign of infection. She has recovered very well from her procedure and has no evidence of any postoperative complications. I think is very reasonable to proceed with antiestrogen therapy and to forego radiation as discussed in the multidisciplinary breast clinic given her age and the status after surgery. I did recommend that she continue with monthly self breast exams and with the antiestrogen therapy and I recommended that she followup with me in 6 months for repeat evaluation and followup mammogram    Plan:     Continued followup with medical oncology and I will see her back in 6 months for repeat exam and followup mammogram. I will see her back sooner as needed

## 2011-12-11 ENCOUNTER — Ambulatory Visit: Payer: Medicare FFS | Admitting: Oncology

## 2012-01-21 ENCOUNTER — Ambulatory Visit: Payer: Self-pay | Admitting: Oncology

## 2012-01-21 ENCOUNTER — Other Ambulatory Visit: Payer: Self-pay | Admitting: Lab

## 2012-02-19 ENCOUNTER — Ambulatory Visit (HOSPITAL_BASED_OUTPATIENT_CLINIC_OR_DEPARTMENT_OTHER): Payer: Medicare HMO | Admitting: Oncology

## 2012-02-19 ENCOUNTER — Encounter: Payer: Self-pay | Admitting: Oncology

## 2012-02-19 ENCOUNTER — Telehealth: Payer: Self-pay | Admitting: Oncology

## 2012-02-19 ENCOUNTER — Other Ambulatory Visit (HOSPITAL_BASED_OUTPATIENT_CLINIC_OR_DEPARTMENT_OTHER): Payer: Medicare HMO | Admitting: Lab

## 2012-02-19 VITALS — BP 138/70 | HR 74 | Temp 98.2°F | Resp 20 | Ht <= 58 in | Wt 115.1 lb

## 2012-02-19 DIAGNOSIS — C50319 Malignant neoplasm of lower-inner quadrant of unspecified female breast: Secondary | ICD-10-CM

## 2012-02-19 DIAGNOSIS — D0590 Unspecified type of carcinoma in situ of unspecified breast: Secondary | ICD-10-CM

## 2012-02-19 DIAGNOSIS — Z170 Estrogen receptor positive status [ER+]: Secondary | ICD-10-CM

## 2012-02-19 LAB — COMPREHENSIVE METABOLIC PANEL (CC13)
ALT: 11 U/L (ref 0–55)
AST: 14 U/L (ref 5–34)
Albumin: 3.1 g/dL — ABNORMAL LOW (ref 3.5–5.0)
BUN: 28 mg/dL — ABNORMAL HIGH (ref 7.0–26.0)
CO2: 29 mEq/L (ref 22–29)
Calcium: 9.3 mg/dL (ref 8.4–10.4)
Chloride: 110 mEq/L — ABNORMAL HIGH (ref 98–107)
Creatinine: 0.9 mg/dL (ref 0.6–1.1)
Potassium: 4.6 mEq/L (ref 3.5–5.1)

## 2012-02-19 LAB — CBC WITH DIFFERENTIAL/PLATELET
BASO%: 1 % (ref 0.0–2.0)
Basophils Absolute: 0 10*3/uL (ref 0.0–0.1)
EOS%: 2.7 % (ref 0.0–7.0)
HCT: 34.9 % (ref 34.8–46.6)
HGB: 11.2 g/dL — ABNORMAL LOW (ref 11.6–15.9)
MONO#: 0.8 10*3/uL (ref 0.1–0.9)
NEUT%: 40.1 % (ref 38.4–76.8)
RDW: 14.4 % (ref 11.2–14.5)
WBC: 4.2 10*3/uL (ref 3.9–10.3)
lymph#: 1.5 10*3/uL (ref 0.9–3.3)

## 2012-02-19 NOTE — Patient Instructions (Addendum)
Continue tamoxifen everyday  We will see you back in 6 months

## 2012-02-19 NOTE — Progress Notes (Signed)
OFFICE PROGRESS NOTE  CC  [name], MD [address] [place] [address] Dr. Lodema Pilot Dr. Lonie Peak  DIAGNOSIS: 78 year old female status post right lumpectomy for DCIS.  PRIOR THERAPY:  #1 patient was originally seen in the multidisciplinary breast clinic with Dr. Lodema Pilot and Dr. Lonie Peak. She was her diagnosed with DCIS of the right breast. Since then she has undergone a right breast needle localized lumpectomy. The final pathology revealed a 4 mm focus of DCIS with negative margins. Tumor was estrogen receptor positive.  #2 patient will now chemoprevention with tamoxifen 20 mg daily. Risks and benefits of this were discussed with the patient. A total of 5 years of therapy is planned.   CURRENT THERAPY: tamoxifen 20 mg daily starting 11/25/2011   INTERVAL HISTORY: Brianna Hunter 78 y.o. female returns for followup visit at 3 months since starting the tamoxifen. Overall she's doing well she has no hot flashes no bleeding she has not noticed any swelling in her legs. She denies any fevers chills night sweats headaches shortness of breath chest pains palpitations no myalgias and arthralgias no easy bruising. Remainder of the 10 point review of systems is negative. MEDICAL HISTORY: Past Medical History  Diagnosis Date  . Rheumatism   . Back pain   . Hypertension   . Pulse irregularity   . Breast cancer   . Anxiety   . Arthritis     celebrex use off & on for arthritis- hands, back   . Heart murmur     echo done 09/2010 /w [place], next appt. coming up    ALLERGIES:  is allergic to milk-related compounds.  MEDICATIONS:  Current Outpatient Prescriptions  Medication Sig Dispense Refill  . aspirin 81 MG EC tablet Take 81 mg by mouth daily.        . Bimatoprost (LUMIGAN) 0.01 % SOLN Place 1 drop into the right eye 3 (three) times daily.       . brimonidine (ALPHAGAN) 0.15 % ophthalmic solution Place 1 drop into the right eye 3 (three) times daily.       . celecoxib (CELEBREX) 200 MG capsule Take 200 mg by mouth at bedtime.       . dorzolamide (TRUSOPT) 2 % ophthalmic solution Place 1 drop into the right eye 3 (three) times daily.      . ferrous gluconate (FERGON) 325 MG tablet Take 325 mg by mouth daily with breakfast.      . gentamicin (GENTAK) 0.3 % ophthalmic solution Place 1 drop into the left eye 3 (three) times daily.      . Multiple Vitamin (MULTIVITAMIN) tablet Take 1 tablet by mouth daily.        . Olmesartan-Amlodipine-HCTZ (TRIBENZOR) 40-5-25 MG TABS Take 1 tablet by mouth daily after breakfast.       . Polyethyl Glycol-Propyl Glycol (SYSTANE) 0.4-0.3 % SOLN Apply 2 drops to eye 2 (two) times daily as needed. For dry eyes      . prednisoLONE acetate (PRED FORTE) 1 % ophthalmic suspension Place 1 drop into the left eye 4 (four) times daily.      . tamoxifen (NOLVADEX) 20 MG tablet Take 1 tablet (20 mg total) by mouth daily.  30 tablet  6  . vitamin C (ASCORBIC ACID) 500 MG tablet Take 500 mg by mouth daily.        SURGICAL HISTORY:  Past Surgical History  Procedure Date  . Knee surgery     Left  .  Shoulder surgery     Left  . Joint replacement     2008- L knee  . Appendectomy   . Abdominal hysterectomy     partial   . Eye surgery     left eye 2013  . Ear cyst excision 11/07/2011    Procedure: CYST REMOVAL;  Surgeon: Lodema Pilot, DO;  Location: [facility] OR;  Service: General;  Laterality: Right;  Removal Cyst Right Upper Chest    REVIEW OF SYSTEMS:  Pertinent items are noted in HPI.   PHYSICAL EXAMINATION:  Well-developed well-nourished female appearing younger than her stated age. HEENT exam is my PERRLA sclerae anicteric no conjunctival pallor oral mucosa is moist neck is supple lungs are clear bilaterally cardiovascular is regular rate rhythm with 2/6 murmur abdomen is soft nontender nondistended bowel sounds are present no HSM extremities no edema neuro patient's alert oriented otherwise nonfocal. Right breast healing  incisional scar no nipple discharge left breast no masses or nipple discharge.  ECOG PERFORMANCE STATUS: 0 - Asymptomatic  Blood pressure 138/70, pulse 74, temperature 98.2 F (36.8 C), resp. rate 20, height 4\' 9"  (1.448 m), weight 115 lb 1.6 oz (52.209 kg).  LABORATORY DATA: Lab Results  Component Value Date   WBC 7.0 11/04/2011   HGB 11.8* 11/04/2011   HCT 36.4 11/04/2011   MCV 69.3* 11/04/2011   PLT 143* 11/04/2011      Chemistry      Component Value Date/Time   NA 140 11/04/2011 1615   K 4.0 11/04/2011 1615   CL 103 11/04/2011 1615   CO2 28 11/04/2011 1615   BUN 28* 11/04/2011 1615   CREATININE 0.76 11/04/2011 1615      Component Value Date/Time   CALCIUM 10.1 11/04/2011 1615   ALKPHOS 61 10/09/2011 1230   AST 16 10/09/2011 1230   ALT 12 10/09/2011 1230   BILITOT 0.2* 10/09/2011 1230     FINAL DIAGNOSIS Diagnosis 1. Breast, lumpectomy, Right - DUCTAL CARCINOMA IN SITU WITH CALCIFICATIONS, INTERMEDIATE GRADE, SPANNING 0.4 CM. - THE SURGICAL RESECTION MARGINS ARE NEGATIVE FOR CARCINOMA. - SEE ONCOLOGY TABLE BELOW. 2. Breast, excision, additional inferior margin - BENIGN BREAST PARENCHYMA. - THERE IS NO EVIDENCE OF MALIGNANCY. - SEE COMMENT. 3. Skin , Chest cyst - EPIDERMOID INCLUSION CYST. - THERE IS NO EVIDENCE OF MALIGNANCY. Microscopic Comment 1. BREAST, IN SITU CARCINOMA Specimen, including laterality: Right breast Procedure: Needle localized lumpectomy Grade of carcinoma: Intermediate grade Necrosis: Not identified Estimated tumor size: (glass slide measurement): 0.4 cm Treatment effect: N/A Distance to closest margin: Greater than 0.2 cm to all margins (gross measurements). Breast prognostic profile: Case [id number] Estrogen receptor: 100%, strong staining intensity. Progesterone receptor: 100%, strong staining intensity Lymph nodes: None examined TNM: pTis, pNX Comments: In addition to the ductal carcinoma in situ involving a papilloma, there is a  fibroadenoma, fibrocystic changes and vascular calcifications. (JBK:kh 11-12-11) 2. The surgical resection m  RADIOGRAPHIC STUDIES:  No results found.  ASSESSMENT: 35-year-old female with  #1 stage 0 (DCIS) of the right breast status post needle.wire localized lumpectomy with the final pathology revealing a 4 mm focus of DCIS that was ER positive. Postoperatively she is doing well without any problems. Patient is now on tamoxifen 20 mg daily as a chemopreventive. She is tolerating it well without any significant problems. Total of 5 years of therapy is planned.   PLAN:   #1 patient will continue taking tamoxifen 20 mg daily.  #2 she will be seen back in 6 months time. If  she is doing well at that time then we will see her on a yearly basis thereafter.   All questions were answered. The patient knows to call the clinic with any problems, questions or concerns. We can certainly see the patient much sooner if necessary.  I spent 25 minutes counseling the patient face to face. The total time spent in the appointment was 30 minutes.    Drue Second, MD Medical/Oncology [facility] [phone] (beeper) [phone] (Office)  02/19/2012, 11:12 AM

## 2012-02-19 NOTE — Telephone Encounter (Signed)
gve the pt her June 2014 appt calendar 

## 2012-03-17 ENCOUNTER — Encounter (INDEPENDENT_AMBULATORY_CARE_PROVIDER_SITE_OTHER): Payer: Self-pay

## 2012-04-15 ENCOUNTER — Encounter (INDEPENDENT_AMBULATORY_CARE_PROVIDER_SITE_OTHER): Payer: Self-pay | Admitting: General Surgery

## 2012-06-02 ENCOUNTER — Encounter (HOSPITAL_COMMUNITY): Payer: Self-pay

## 2012-06-02 ENCOUNTER — Emergency Department (HOSPITAL_COMMUNITY): Payer: Medicare HMO

## 2012-06-02 ENCOUNTER — Emergency Department (HOSPITAL_COMMUNITY)
Admission: EM | Admit: 2012-06-02 | Discharge: 2012-06-02 | Disposition: A | Discharge status: Home / Self Care | Payer: Medicare HMO | Attending: Emergency Medicine | Admitting: Emergency Medicine

## 2012-06-02 DIAGNOSIS — Z7982 Long term (current) use of aspirin: Secondary | ICD-10-CM | POA: Insufficient documentation

## 2012-06-02 DIAGNOSIS — R05 Cough: Secondary | ICD-10-CM

## 2012-06-02 DIAGNOSIS — Z8659 Personal history of other mental and behavioral disorders: Secondary | ICD-10-CM | POA: Insufficient documentation

## 2012-06-02 DIAGNOSIS — M790 Rheumatism, unspecified: Secondary | ICD-10-CM | POA: Insufficient documentation

## 2012-06-02 DIAGNOSIS — M797 Fibromyalgia: Secondary | ICD-10-CM | POA: Insufficient documentation

## 2012-06-02 DIAGNOSIS — Z8739 Personal history of other diseases of the musculoskeletal system and connective tissue: Secondary | ICD-10-CM | POA: Insufficient documentation

## 2012-06-02 DIAGNOSIS — J069 Acute upper respiratory infection, unspecified: Secondary | ICD-10-CM | POA: Insufficient documentation

## 2012-06-02 DIAGNOSIS — I10 Essential (primary) hypertension: Secondary | ICD-10-CM | POA: Insufficient documentation

## 2012-06-02 DIAGNOSIS — Z79899 Other long term (current) drug therapy: Secondary | ICD-10-CM | POA: Insufficient documentation

## 2012-06-02 DIAGNOSIS — Z791 Long term (current) use of non-steroidal anti-inflammatories (NSAID): Secondary | ICD-10-CM | POA: Insufficient documentation

## 2012-06-02 DIAGNOSIS — R011 Cardiac murmur, unspecified: Secondary | ICD-10-CM | POA: Insufficient documentation

## 2012-06-02 DIAGNOSIS — Z853 Personal history of malignant neoplasm of breast: Secondary | ICD-10-CM | POA: Insufficient documentation

## 2012-06-02 DIAGNOSIS — Z792 Long term (current) use of antibiotics: Secondary | ICD-10-CM | POA: Insufficient documentation

## 2012-06-02 DIAGNOSIS — M129 Arthropathy, unspecified: Secondary | ICD-10-CM | POA: Insufficient documentation

## 2012-06-02 DIAGNOSIS — R059 Cough, unspecified: Secondary | ICD-10-CM | POA: Insufficient documentation

## 2012-06-02 LAB — CBC
HCT: 33.9 % — ABNORMAL LOW (ref 36.0–46.0)
MCHC: 32.7 g/dL (ref 30.0–36.0)
RDW: 14.3 % (ref 11.5–15.5)
WBC: 5.6 10*3/uL (ref 4.0–10.5)

## 2012-06-02 LAB — BASIC METABOLIC PANEL
Chloride: 101 mEq/L (ref 96–112)
GFR calc Af Amer: 90 mL/min — ABNORMAL LOW (ref >90)
GFR calc non Af Amer: 77 mL/min — ABNORMAL LOW (ref >90)
Potassium: 3.9 mEq/L (ref 3.5–5.1)
Sodium: 138 mEq/L (ref 135–145)

## 2012-06-02 MED ORDER — ALBUTEROL SULFATE HFA 108 (90 BASE) MCG/ACT IN AERS
4.0000 | INHALATION_SPRAY | Freq: Once | RESPIRATORY_TRACT | Status: AC
  Administered 2012-06-02: 4 via RESPIRATORY_TRACT
  Filled 2012-06-02: qty 6.7

## 2012-06-02 MED ORDER — BENZONATATE 100 MG PO CAPS
100.0000 mg | ORAL_CAPSULE | Freq: Once | ORAL | Status: AC
  Administered 2012-06-02: 100 mg via ORAL
  Filled 2012-06-02: qty 1

## 2012-06-02 MED ORDER — IOHEXOL 350 MG/ML SOLN
100.0000 mL | Freq: Once | INTRAVENOUS | Status: AC | PRN
  Administered 2012-06-02: 100 mL via INTRAVENOUS

## 2012-06-02 MED ORDER — BENZONATATE 100 MG PO CAPS: 100.0000 mg | ORAL_CAPSULE | Freq: Two times a day (BID) | ORAL | Status: DC | PRN

## 2012-06-02 NOTE — ED Provider Notes (Signed)
I saw and evaluated the patient, reviewed the resident's note and I agree with the findings and plan.  Symptoms most consistent with bronchitis.  Given her abnormalities noted on her chest x-ray CT and she was performed to evaluate for a sitting aortic aneurysm more for followup reasons.  There is no obvious aneurysm noted on CT scan.  Patient did have a small amount of contrast extravasation in her left arm.  This is a very small amount.  She's been told to return to the ER for new or worsening symptoms and she's been told to apply ice and elevate the arm.  Detailed instructions were given to her by the radiology department.  There is question of interstitial edema however this does not appear to be a congestive heart failure exacerbation.  Close PCP followup.  Discharge home in good condition.  Dg Chest 2 View  06/02/2012  *RADIOLOGY REPORT*  Clinical Data: Cough  CHEST - 2 VIEW  Comparison: None  Findings: There is prominence of the aortic arch.  This may reflect a tortuous and unfolded aorta.  Cannot rule out aneurysm.  Heart size appears normal.  No pleural effusion or edema.  No airspace consolidation.  IMPRESSION:  1.  No acute cardiopulmonary abnormalities. 2.  Prominence of the thoracic aorta which may be due to tortuosity or aneurysmal dilatation.   Original Report Authenticated By: Signa Kell, M.D.    Ct Angio Chest W/cm &/or Wo Cm  06/02/2012  *RADIOLOGY REPORT*  Clinical Data: Cough.  CT ANGIOGRAPHY CHEST  Technique:  Multidetector CT imaging of the chest using the standard protocol during bolus administration of intravenous contrast. Multiplanar reconstructed images including MIPs were obtained and reviewed to evaluate the vascular anatomy.  Contrast: OMNIPAQUE IOHEXOL 350 MG/ML SOLN  Comparison: No priors.  Comment: Examination was complicated by a small volume of infiltration of iodinated contrast and the patient's right antecubital fossa.  The technologist estimates of approximately  40 ml of nonionic iodinated contrast was infused into the subcutaneous tissues.  I personally examined the patient, and found the patient to be neurovascular the intact.  The patient was instructed to apply ice packs to the area over the next 24 hours (at least four times) for 20 minutes on, and was instructed to pay close attention for signs and symptoms such as blistering or ulceration of the skin, decreased capillary refill, change in skin color, altered sensation or motor function, decreased pulses, or increased pain and swelling in the right upper extremity.  The patient voice understanding of the discussion.  This was also discussed with Dr. Patria Mane  in the emergency department on 06/02/2012 at 03:55 p.m.  Findings:  Mediastinum: On the precontrast images there is no crescentic high attenuation associated with the wall of the thoracic aorta to suggest the presence of acute intramural hemorrhage. No acute abnormality of the thoracic aorta; specifically, no aneurysm or dissection. Heart size is mildly enlarged. There is no significant pericardial fluid, thickening or pericardial calcification. No pathologically enlarged mediastinal or hilar lymph nodes. Esophagus is unremarkable in appearance.  Lungs/Pleura: There is a pattern of diffuse mild interlobular septal thickening and mild patchy ground-glass attenuation, most compatible with mild interstitial pulmonary edema.  No definite suspicious appearing pulmonary nodules or masses are identified. No confluent consolidative airspace disease.  No pleural effusions.  Upper Abdomen: Calcification in the liver likely represents a calcified granuloma.  Musculoskeletal: There are no aggressive appearing lytic or blastic lesions noted in the visualized portions of the skeleton.  Anterior subluxation of the right clavicle at the sternoclavicular joint is noted, and has an appearance suggestive of a chronic injury. There are no aggressive appearing lytic or blastic lesions  noted in the visualized portions of the skeleton.  IMPRESSION: 1.  No evidence of acute aortic syndrome. 2.  Mild cardiomegaly with evidence of mild interstitial pulmonary edema; imaging findings suggestive of mild congestive heart failure. 3.  Additional incidental findings, as above.   Original Report Authenticated By: Trudie Reed, M.D.      Lyanne Co, MD 06/02/12 [phone]

## 2012-06-02 NOTE — ED Notes (Signed)
MD at bedside. ED 

## 2012-06-02 NOTE — Progress Notes (Signed)
This patient has received 35-40 ml's of IV  Omnipaque 350 (type of contrast) contrast extravasation into RIGHT Antecubitus  (part of body) during a CTA Chest  exam.  The exam was performed on (date)06/02/2012    Site / affected area assessed by Dr D. Entrekin

## 2012-06-02 NOTE — ED Notes (Signed)
Pt reports persistent cough for the last several weeks. Denies fevers. States cough is productive with white mucous. Pt with hoarse voice. Resp, even unlabored in triage.

## 2012-06-02 NOTE — ED Provider Notes (Signed)
History     CSN: [id number]  Arrival date & time 06/02/12  1022   First MD Initiated Contact with Patient 06/02/12 1114      Chief Complaint  Patient presents with  . Cough    (Consider location/radiation/quality/duration/timing/severity/associated sxs/prior treatment) Patient is a 79 y.o. female presenting with cough. The history is provided by the patient.  Cough Cough characteristics:  Productive Sputum characteristics:  White Severity:  Moderate Onset quality:  Gradual Duration:  2 weeks Timing:  Intermittent Progression:  Unchanged Chronicity:  New Context: upper respiratory infection (lost her voice last week, this is getting better)   Context: not sick contacts   Relieved by:  Nothing Worsened by:  Lying down Ineffective treatments:  None tried Associated symptoms: no chest pain, no fever and no shortness of breath     Past Medical History  Diagnosis Date  . Rheumatism   . Back pain   . Hypertension   . Pulse irregularity   . Breast cancer   . Anxiety   . Arthritis     celebrex use off & on for arthritis- hands, back   . Heart murmur     echo done 09/2010 /w [place], next appt. coming up    Past Surgical History  Procedure Laterality Date  . Knee surgery      Left  . Shoulder surgery      Left  . Joint replacement      2008- L knee  . Appendectomy    . Abdominal hysterectomy      partial   . Eye surgery      left eye 2013  . Ear cyst excision  11/07/2011    Procedure: CYST REMOVAL;  Surgeon: Lodema Pilot, DO;  Location: [facility] OR;  Service: General;  Laterality: Right;  Removal Cyst Right Upper Chest    Family History  Problem Relation Age of Onset  . Diabetes Mother   . Other Mother     Bleeding disorder  . Diabetes Father   . Other Father     Bleeding disorder    History  Substance Use Topics  . Smoking status: Never Smoker   . Smokeless tobacco: Not on file  . Alcohol Use: No    OB History   Grav Para Term Preterm Abortions TAB SAB  Ect Mult Living                  Review of Systems  Constitutional: Negative for fever.  HENT: Negative for congestion.   Respiratory: Positive for cough. Negative for shortness of breath.   Cardiovascular: Negative for chest pain.  Gastrointestinal: Negative for nausea, vomiting, abdominal pain and diarrhea.  Genitourinary: Negative for difficulty urinating.  All other systems reviewed and are negative.    Allergies  Milk-related compounds  Home Medications   Current Outpatient Rx  Name  Route  Sig  Dispense  Refill  . aspirin 81 MG EC tablet   Oral   Take 81 mg by mouth daily.           . Bimatoprost (LUMIGAN) 0.01 % SOLN   Right Eye   Place 1 drop into the right eye 3 (three) times daily.          . brimonidine (ALPHAGAN) 0.15 % ophthalmic solution   Right Eye   Place 1 drop into the right eye 3 (three) times daily.         . celecoxib (CELEBREX) 200 MG capsule   Oral  Take 200 mg by mouth at bedtime.          . dorzolamide (TRUSOPT) 2 % ophthalmic solution   Right Eye   Place 1 drop into the right eye 3 (three) times daily.         . ferrous gluconate (FERGON) 325 MG tablet   Oral   Take 325 mg by mouth daily with breakfast.         . gentamicin (GENTAK) 0.3 % ophthalmic solution   Left Eye   Place 1 drop into the left eye 3 (three) times daily.         . Multiple Vitamin (MULTIVITAMIN) tablet   Oral   Take 1 tablet by mouth daily.           . Olmesartan-Amlodipine-HCTZ (TRIBENZOR) 40-5-25 MG TABS   Oral   Take 1 tablet by mouth daily after breakfast.          . Polyethyl Glycol-Propyl Glycol (SYSTANE) 0.4-0.3 % SOLN   Ophthalmic   Apply 2 drops to eye 2 (two) times daily as needed. For dry eyes         . prednisoLONE acetate (PRED FORTE) 1 % ophthalmic suspension   Left Eye   Place 1 drop into the left eye 4 (four) times daily.         . tamoxifen (NOLVADEX) 20 MG tablet   Oral   Take 1 tablet (20 mg total) by mouth  daily.   30 tablet   6   . vitamin C (ASCORBIC ACID) 500 MG tablet   Oral   Take 500 mg by mouth daily.           BP 138/55  Pulse 109  Temp(Src) 97.1 F (36.2 C) (Oral)  Resp 15  SpO2 99%  Physical Exam  Nursing note and vitals reviewed. Constitutional: She is oriented to person, place, and time. She appears well-developed and well-nourished. No distress.  HENT:  Head: Normocephalic and atraumatic.  Mouth/Throat: Oropharynx is clear and moist.  Eyes: Conjunctivae are normal. Pupils are equal, round, and reactive to light. No scleral icterus.  Neck: Neck supple.  Cardiovascular: Normal rate, regular rhythm, normal heart sounds and intact distal pulses.   No murmur heard. Pulmonary/Chest: Effort normal. No stridor. No respiratory distress. She has decreased breath sounds (mild decreased air movement). She has no wheezes. She has no rales.  Abdominal: Soft. Bowel sounds are normal. She exhibits no distension. There is no tenderness.  Musculoskeletal: Normal range of motion.  Neurological: She is alert and oriented to person, place, and time.  Skin: Skin is warm and dry. No rash noted.  Psychiatric: She has a normal mood and affect. Her behavior is normal.    ED Course  Procedures (including critical care time)  Labs Reviewed  CBC - Abnormal; Notable for the following:    Hemoglobin 11.1 (*)    HCT 33.9 (*)    MCV 69.0 (*)    MCH 22.6 (*)    All other components within normal limits  BASIC METABOLIC PANEL - Abnormal; Notable for the following:    Glucose, Bld 121 (*)    GFR calc non Af Amer 77 (*)    GFR calc Af Amer 90 (*)    All other components within normal limits   Dg Chest 2 View  06/02/2012  *RADIOLOGY REPORT*  Clinical Data: Cough  CHEST - 2 VIEW  Comparison: None  Findings: There is prominence of the aortic arch.  This  may reflect a tortuous and unfolded aorta.  Cannot rule out aneurysm.  Heart size appears normal.  No pleural effusion or edema.  No  airspace consolidation.  IMPRESSION:  1.  No acute cardiopulmonary abnormalities. 2.  Prominence of the thoracic aorta which may be due to tortuosity or aneurysmal dilatation.   Original Report Authenticated By: Signa Kell, M.D.    Ct Angio Chest W/cm &/or Wo Cm  06/02/2012  *RADIOLOGY REPORT*  Clinical Data: Cough.  CT ANGIOGRAPHY CHEST  Technique:  Multidetector CT imaging of the chest using the standard protocol during bolus administration of intravenous contrast. Multiplanar reconstructed images including MIPs were obtained and reviewed to evaluate the vascular anatomy.  Contrast: OMNIPAQUE IOHEXOL 350 MG/ML SOLN  Comparison: No priors.  Comment: Examination was complicated by a small volume of infiltration of iodinated contrast and the patient's right antecubital fossa.  The technologist estimates of approximately 40 ml of nonionic iodinated contrast was infused into the subcutaneous tissues.  I personally examined the patient, and found the patient to be neurovascular the intact.  The patient was instructed to apply ice packs to the area over the next 24 hours (at least four times) for 20 minutes on, and was instructed to pay close attention for signs and symptoms such as blistering or ulceration of the skin, decreased capillary refill, change in skin color, altered sensation or motor function, decreased pulses, or increased pain and swelling in the right upper extremity.  The patient voice understanding of the discussion.  This was also discussed with Dr. Patria Mane  in the emergency department on 06/02/2012 at 03:55 p.m.  Findings:  Mediastinum: On the precontrast images there is no crescentic high attenuation associated with the wall of the thoracic aorta to suggest the presence of acute intramural hemorrhage. No acute abnormality of the thoracic aorta; specifically, no aneurysm or dissection. Heart size is mildly enlarged. There is no significant pericardial fluid, thickening or pericardial  calcification. No pathologically enlarged mediastinal or hilar lymph nodes. Esophagus is unremarkable in appearance.  Lungs/Pleura: There is a pattern of diffuse mild interlobular septal thickening and mild patchy ground-glass attenuation, most compatible with mild interstitial pulmonary edema.  No definite suspicious appearing pulmonary nodules or masses are identified. No confluent consolidative airspace disease.  No pleural effusions.  Upper Abdomen: Calcification in the liver likely represents a calcified granuloma.  Musculoskeletal: There are no aggressive appearing lytic or blastic lesions noted in the visualized portions of the skeleton.  Anterior subluxation of the right clavicle at the sternoclavicular joint is noted, and has an appearance suggestive of a chronic injury. There are no aggressive appearing lytic or blastic lesions noted in the visualized portions of the skeleton.  IMPRESSION: 1.  No evidence of acute aortic syndrome. 2.  Mild cardiomegaly with evidence of mild interstitial pulmonary edema; imaging findings suggestive of mild congestive heart failure. 3.  Additional incidental findings, as above.   Original Report Authenticated By: Trudie Reed, M.D.   All radiology studies independently viewed by me.      1. Cough       MDM   79 yo female with a cough of 2-3 weeks duration.  No fevers, SOB, CP.  Lost her voice last week, which is improving now.  CXR showed aortic prominence.  Plan to CT to better define.  However, cough is likely secondary to viral illness, especially given symptoms of laryngitis last week.  Given albuterol and tessalon which has helped cough.    4:10 PM CT  negative for aortic syndrome.  Did show evidence of mild CHF, which her history does not support.  Will dc home to follow up with PCP for further evaluation.      Rennis Petty, MD 06/02/12 [phone]

## 2012-06-02 NOTE — ED Notes (Signed)
Patient transported to CT 

## 2012-06-02 NOTE — ED Notes (Addendum)
Pt comes in today with complaints of a cough for past 2-3 weeks. Pt says that she coughs up white mucous. Pt also is hoarse and states that last week she had lost voice completely. No SOB or other symptoms. Pt hx per pt- HTN, Breast CA, chemotherapy, lump removed from R breast, arthritis, glacouma, and a heart murmur.

## 2012-06-05 ENCOUNTER — Ambulatory Visit (INDEPENDENT_AMBULATORY_CARE_PROVIDER_SITE_OTHER): Payer: Medicare HMO | Admitting: General Surgery

## 2012-06-05 ENCOUNTER — Encounter (INDEPENDENT_AMBULATORY_CARE_PROVIDER_SITE_OTHER): Payer: Self-pay | Admitting: General Surgery

## 2012-06-05 VITALS — BP 112/60 | HR 101 | Temp 98.3°F | Resp 18 | Ht <= 58 in | Wt 114.8 lb

## 2012-06-05 DIAGNOSIS — Z853 Personal history of malignant neoplasm of breast: Secondary | ICD-10-CM

## 2012-06-05 NOTE — Progress Notes (Signed)
Subjective:     Patient ID: Brianna Hunter, female   DOB: 11-May-1928, 79 y.o.   MRN: [id number]  HPI This patient follows up status post right needle localized lumpectomy last fall.  She is doing well from her procedure. She is taking tamoxifen and is followed by Dr. Welton Flakes for this. She is not doing her self breast exams. She has no complaints other than some cough which has been present for a week and she is scheduled to see her primary care physician for this later today. She denies any shortness of breath.  Review of Systems     Objective:   Physical Exam In no acute distress and nontoxic-appearing Her right breast is normal to exam I do not appreciate any evidence of recurrent disease. She has good cosmesis. There is no lymphadenopathy. Her left breast is normal as well without any suspicious masses, skin changes, or lymphadenopathy    Assessment:     History of right breast DCIS-doing well She did very well procedure and has no evidence of recurrent disease. She remains on tamoxifen . I would recommend repeat mammogram on the right 6 months from time of her lumpectomy but she is  Due for her annual mammograms coming up in June so we will go ahead and wait until that time. I will see her back after her mammograms. I again recommended that she do her monthly self breast exams.    Plan:     Continuous monthly self breast exams and followup after her mammogram this summer and

## 2012-06-24 ENCOUNTER — Other Ambulatory Visit: Payer: Self-pay | Admitting: Emergency Medicine

## 2012-06-24 MED ORDER — TAMOXIFEN CITRATE 20 MG PO TABS: 20.0000 mg | ORAL_TABLET | Freq: Every day | ORAL | Status: DC

## 2012-08-19 ENCOUNTER — Other Ambulatory Visit (HOSPITAL_BASED_OUTPATIENT_CLINIC_OR_DEPARTMENT_OTHER): Payer: Medicare HMO | Admitting: Lab

## 2012-08-19 ENCOUNTER — Encounter: Payer: Self-pay | Admitting: Adult Health

## 2012-08-19 ENCOUNTER — Telehealth: Payer: Self-pay | Admitting: Oncology

## 2012-08-19 ENCOUNTER — Ambulatory Visit (HOSPITAL_BASED_OUTPATIENT_CLINIC_OR_DEPARTMENT_OTHER): Payer: Medicare HMO | Admitting: Adult Health

## 2012-08-19 VITALS — BP 136/70 | HR 87 | Temp 98.4°F | Resp 20 | Ht <= 58 in | Wt 113.4 lb

## 2012-08-19 DIAGNOSIS — C50319 Malignant neoplasm of lower-inner quadrant of unspecified female breast: Secondary | ICD-10-CM

## 2012-08-19 DIAGNOSIS — C50311 Malignant neoplasm of lower-inner quadrant of right female breast: Secondary | ICD-10-CM

## 2012-08-19 DIAGNOSIS — E559 Vitamin D deficiency, unspecified: Secondary | ICD-10-CM

## 2012-08-19 DIAGNOSIS — D0590 Unspecified type of carcinoma in situ of unspecified breast: Secondary | ICD-10-CM

## 2012-08-19 DIAGNOSIS — Z170 Estrogen receptor positive status [ER+]: Secondary | ICD-10-CM

## 2012-08-19 LAB — COMPREHENSIVE METABOLIC PANEL (CC13)
ALT: 9 U/L (ref 0–55)
AST: 13 U/L (ref 5–34)
Albumin: 3.1 g/dL — ABNORMAL LOW (ref 3.5–5.0)
CO2: 27 mEq/L (ref 22–29)
Calcium: 9.2 mg/dL (ref 8.4–10.4)
Chloride: 107 mEq/L (ref 98–107)
Creatinine: 1 mg/dL (ref 0.6–1.1)
Potassium: 4.3 mEq/L (ref 3.5–5.1)
Sodium: 140 mEq/L (ref 136–145)
Total Protein: 6.8 g/dL (ref 6.4–8.3)

## 2012-08-19 LAB — CBC WITH DIFFERENTIAL/PLATELET
BASO%: 0.2 % (ref 0.0–2.0)
Eosinophils Absolute: 0.1 10*3/uL (ref 0.0–0.5)
LYMPH%: 34.8 % (ref 14.0–49.7)
MCHC: 32.5 g/dL (ref 31.5–36.0)
MONO#: 0.5 10*3/uL (ref 0.1–0.9)
NEUT#: 2.5 10*3/uL (ref 1.5–6.5)
Platelets: 131 10*3/uL — ABNORMAL LOW (ref 145–400)
RBC: 4.92 10*6/uL (ref 3.70–5.45)
WBC: 4.7 10*3/uL (ref 3.9–10.3)
lymph#: 1.6 10*3/uL (ref 0.9–3.3)
nRBC: 0 % (ref 0–0)

## 2012-08-19 NOTE — Progress Notes (Signed)
OFFICE PROGRESS NOTE  CC  [name], MD [address] [place] [address] Dr. Lodema Pilot Dr. Lonie Peak  DIAGNOSIS: 79 year old female status post right lumpectomy for DCIS.  PRIOR THERAPY:  #1 patient was originally seen in the multidisciplinary breast clinic with Dr. Lodema Pilot and Dr. Lonie Peak. She was her diagnosed with DCIS of the right breast. Since then she has undergone a right breast needle localized lumpectomy. The final pathology revealed a 4 mm focus of DCIS with negative margins. Tumor was estrogen receptor positive.  #2 patient will now chemoprevention with tamoxifen 20 mg daily. Risks and benefits of this were discussed with the patient. A total of 5 years of therapy is planned.   CURRENT THERAPY: tamoxifen 20 mg daily starting 11/25/2011   INTERVAL HISTORY: Brianna Hunter 79 y.o. female returns for followup visit for evaluation of her right breast DCIS.  She is taking the tamoxifen daily.  She denies hot flashes, but she does have joint aches and takes Celebrex for this.  She has noticed no dryness, fevers, chills, nausea, vomiting, constipation, diarrhea, dyspnea, night sweats, unintentional weight loss.  A 10 point ROS is neg. Her eyes are being followed closely by opthalmology every other month for glaucoma.  She takes Vitamin D every day.    MEDICAL HISTORY: Past Medical History  Diagnosis Date  . Rheumatism   . Back pain   . Hypertension   . Pulse irregularity   . Breast cancer   . Anxiety   . Arthritis     celebrex use off & on for arthritis- hands, back   . Heart murmur     echo done 09/2010 /w [place], next appt. coming up    ALLERGIES:  is allergic to milk-related compounds.  MEDICATIONS:  Current Outpatient Prescriptions  Medication Sig Dispense Refill  . aspirin 81 MG EC tablet Take 81 mg by mouth daily.        . benzonatate (TESSALON) 100 MG capsule Take 1 capsule (100 mg total) by mouth 2 (two) times daily as needed  for cough.  14 capsule  0  . brimonidine (ALPHAGAN) 0.15 % ophthalmic solution       . celecoxib (CELEBREX) 200 MG capsule Take 200 mg by mouth at bedtime.       . dorzolamide-timolol (COSOPT) 22.3-6.8 MG/ML ophthalmic solution       . ferrous gluconate (FERGON) 325 MG tablet Take 325 mg by mouth daily with breakfast.      . LUMIGAN 0.01 % SOLN       . Multiple Vitamin (MULTIVITAMIN) tablet Take 1 tablet by mouth daily.        . Olmesartan-Amlodipine-HCTZ (TRIBENZOR) 40-5-25 MG TABS Take 1 tablet by mouth daily after breakfast.       . tamoxifen (NOLVADEX) 20 MG tablet Take 1 tablet (20 mg total) by mouth daily.  30 tablet  6  . trimethoprim-polymyxin b (POLYTRIM) ophthalmic solution Place 2 drops into the left eye daily.       No current facility-administered medications for this visit.    SURGICAL HISTORY:  Past Surgical History  Procedure Laterality Date  . Knee surgery      Left  . Shoulder surgery      Left  . Joint replacement      2008- L knee  . Appendectomy    . Abdominal hysterectomy      partial   . Eye surgery      left eye 2013  .  Ear cyst excision  11/07/2011    Procedure: CYST REMOVAL;  Surgeon: Lodema Pilot, DO;  Location: [facility] OR;  Service: General;  Laterality: Right;  Removal Cyst Right Upper Chest    REVIEW OF SYSTEMS:  General: fatigue (-), night sweats (-), fever (-), pain (-) Lymph: palpable nodes (-) HEENT: vision changes (-), mucositis (-), gum bleeding (-), epistaxis (-) Cardiovascular: chest pain (-), palpitations (-) Pulmonary: shortness of breath (-), dyspnea on exertion (-), cough (-), hemoptysis (-) GI:  Early satiety (-), melena (-), dysphagia (-), nausea/vomiting (-), diarrhea (-) GU: dysuria (-), hematuria (-), incontinence (-) Musculoskeletal: joint swelling (-), joint pain (-), back pain (-) Neuro: weakness (-), numbness (-), headache (-), confusion (-) Skin: Rash (-), lesions (-), dryness (-) Psych: depression (-), suicidal/homicidal  ideation (-), feeling of hopelessness (-)   PHYSICAL EXAMINATION:  BP 136/70  Pulse 87  Temp(Src) 98.4 F (36.9 C) (Oral)  Resp 20  Ht 4\' 9"  (1.448 m)  Wt 113 lb 6.4 oz (51.438 kg)  BMI 24.53 kg/m2 General: Patient is a well appearing female in no acute distress HEENT: PERRLA, sclerae anicteric no conjunctival pallor, MMM Neck: supple, no palpable adenopathy Lungs: clear to auscultation bilaterally, no wheezes, rhonchi, or rales Cardiovascular: regular rate rhythm, S1, S2, 2/6 murmur, no rubs or gallops Abdomen: Soft, non-tender, non-distended, normoactive bowel sounds, no HSM Extremities: warm and well perfused, no clubbing, cyanosis, or edema Skin: No rashes or lesions Neuro: Non-focal Breasts: right breast no masses, nodularity, skin changes, left breast no masses or nodularity, no axillary adenopathy.   ECOG PERFORMANCE STATUS: 0 - Asymptomatic  LABORATORY DATA: Lab Results  Component Value Date   WBC 4.7 08/19/2012   HGB 11.1* 08/19/2012   HCT 34.2* 08/19/2012   MCV 69.5* 08/19/2012   PLT 131* 08/19/2012      Chemistry      Component Value Date/Time   NA 140 08/19/2012 1026   NA 138 06/02/2012 1105   K 4.3 08/19/2012 1026   K 3.9 06/02/2012 1105   CL 107 08/19/2012 1026   CL 101 06/02/2012 1105   CO2 27 08/19/2012 1026   CO2 29 06/02/2012 1105   BUN 22.7 08/19/2012 1026   BUN 11 06/02/2012 1105   CREATININE 1.0 08/19/2012 1026   CREATININE 0.73 06/02/2012 1105      Component Value Date/Time   CALCIUM 9.2 08/19/2012 1026   CALCIUM 9.6 06/02/2012 1105   ALKPHOS 48 08/19/2012 1026   ALKPHOS 61 10/09/2011 1230   AST 13 08/19/2012 1026   AST 16 10/09/2011 1230   ALT 9 08/19/2012 1026   ALT 12 10/09/2011 1230   BILITOT 0.32 08/19/2012 1026   BILITOT 0.2* 10/09/2011 1230     FINAL DIAGNOSIS Diagnosis 1. Breast, lumpectomy, Right - DUCTAL CARCINOMA IN SITU WITH CALCIFICATIONS, INTERMEDIATE GRADE, SPANNING 0.4 CM. - THE SURGICAL RESECTION MARGINS ARE NEGATIVE FOR CARCINOMA. -  SEE ONCOLOGY TABLE BELOW. 2. Breast, excision, additional inferior margin - BENIGN BREAST PARENCHYMA. - THERE IS NO EVIDENCE OF MALIGNANCY. - SEE COMMENT. 3. Skin , Chest cyst - EPIDERMOID INCLUSION CYST. - THERE IS NO EVIDENCE OF MALIGNANCY. Microscopic Comment 1. BREAST, IN SITU CARCINOMA Specimen, including laterality: Right breast Procedure: Needle localized lumpectomy Grade of carcinoma: Intermediate grade Necrosis: Not identified Estimated tumor size: (glass slide measurement): 0.4 cm Treatment effect: N/A Distance to closest margin: Greater than 0.2 cm to all margins (gross measurements). Breast prognostic profile: Case [phone] Estrogen receptor: 100%, strong staining intensity. Progesterone receptor: 100%, strong  staining intensity Lymph nodes: None examined TNM: pTis, pNX Comments: In addition to the ductal carcinoma in situ involving a papilloma, there is a fibroadenoma, fibrocystic changes and vascular calcifications. (JBK:kh 11-12-11) 2. The surgical resection m  RADIOGRAPHIC STUDIES:  No results found.  ASSESSMENT: 77-year-old female with  #1 stage 0 (DCIS) of the right breast status post needle.wire localized lumpectomy with the final pathology revealing a 4 mm focus of DCIS that was ER positive. Postoperatively she is doing well without any problems. Patient is now on tamoxifen 20 mg daily as a chemopreventive. She is tolerating it well without any significant problems. Total of 5 years of therapy is planned.   PLAN:   #1 Patient is doing well, no sign of recurrence, patient will continue taking tamoxifen 20 mg daily.  She is getting her mammo next month.    #2 We will see her back next year.     All questions were answered. The patient knows to call the clinic with any problems, questions or concerns. We can certainly see the patient much sooner if necessary.  I spent 25 minutes counseling the patient face to face. The total time spent in the  appointment was 30 minutes.   Cherie Ouch Lyn Hollingshead, NP Medical Oncology [facility] Phone: [phone] 08/19/2012, 11:43 AM

## 2012-08-19 NOTE — Patient Instructions (Signed)
Doing well.  No sign of recurrence.  Continue Tamoxifen daily, we will see you back next year.

## 2012-08-19 NOTE — Telephone Encounter (Signed)
, °

## 2012-12-29 ENCOUNTER — Telehealth: Payer: Self-pay | Admitting: Oncology

## 2012-12-29 NOTE — Telephone Encounter (Signed)
no ans home ph lvmm mob ph adv appts changed [phone] to [phone] per prov sch [facility]g [facility]

## 2013-02-01 ENCOUNTER — Ambulatory Visit (INDEPENDENT_AMBULATORY_CARE_PROVIDER_SITE_OTHER): Payer: Medicare HMO | Admitting: Physician Assistant

## 2013-02-01 ENCOUNTER — Encounter: Payer: Self-pay | Admitting: General Surgery

## 2013-02-01 ENCOUNTER — Ambulatory Visit: Payer: Commercial Managed Care - HMO | Admitting: Cardiology

## 2013-02-01 ENCOUNTER — Encounter: Payer: Self-pay | Admitting: Physician Assistant

## 2013-02-01 VITALS — BP 142/82 | HR 73 | Ht <= 58 in | Wt 115.0 lb

## 2013-02-01 DIAGNOSIS — I359 Nonrheumatic aortic valve disorder, unspecified: Secondary | ICD-10-CM

## 2013-02-01 DIAGNOSIS — I351 Nonrheumatic aortic (valve) insufficiency: Secondary | ICD-10-CM

## 2013-02-01 DIAGNOSIS — I10 Essential (primary) hypertension: Secondary | ICD-10-CM

## 2013-02-01 DIAGNOSIS — I38 Endocarditis, valve unspecified: Secondary | ICD-10-CM

## 2013-02-01 NOTE — Patient Instructions (Signed)
Your physician recommends that you continue on your current medications as directed. Please refer to the Current Medication list given to you today.  Your physician has requested that you have an echocardiogram. Echocardiography is a painless test that uses sound waves to create images of your heart. It provides your doctor with information about the size and shape of your heart and how well your heart's chambers and valves are working. This procedure takes approximately one hour. There are no restrictions for this procedure.  Your physician wants you to follow-up in: 1 year with Dr Earlean Shawl will receive a reminder letter in the mail two months in advance. If you don't receive a letter, please call our office to schedule the follow-up appointment.

## 2013-02-01 NOTE — Progress Notes (Signed)
[address] [place], [place]  [address] Phone: [phone] Fax:  [phone]  Date:  02/01/2013   ID:  Brianna Hunter, DOB 05-Dec-1928, MRN [id number]  PCP:  [name], MD  Cardiologist:  Dr. Marca Ancona    History of Present Illness: Brianna Hunter is a 79 y.o. female with a hx of valvular heart disease, HTN, HL.  Last seen by Dr. Marca Ancona in 08/2010.  Echo (09/2010):  Mild LVH, EF 60%, Gr 1 DD, mild to mod AI, mild MR, mild TR, mild to mod PI, PASP 36.    Since last seen, she is doing well. She does get tired more easily now.  She still lives at [facility].  She cleans her own room.  She notes dyspnea with more extreme activities.  She is likely NYHA Class IIb.  She denies orthopnea, PND.  She has occasional chest pain with emotional stress. She denies exertional chest pain.  She denies syncope.  She has some dependent edema that she attributes to venous insufficiency.   Recent Labs: 08/19/2012: ALT 9; Creatinine 1.0; Hemoglobin 11.1*; Potassium 4.3   Wt Readings from Last 3 Encounters:  02/01/13 115 lb (52.164 kg)  08/19/12 113 lb 6.4 oz (51.438 kg)  06/05/12 114 lb 12.8 oz (52.073 kg)     Past Medical History  Diagnosis Date  . Rheumatism   . Back pain   . Hypertension   . Palpitations     a. holter (7/11):  isolated PVCs and PACs  . Breast cancer     a. DCIS s/p R lumpectomy  . Anxiety   . Arthritis     celebrex use off & on for arthritis- hands, back   . Valvular heart disease     a. Echo (7/11):  EF 55-60%, Gr 1 DD, mild-mod AI, mild-mod MR, mod PI, PASP 30;  b. Echo (09/2010):  Mild LVH, EF 60%, Gr 1 DD, mild to mod AI, mild MR, mild TR, mild to mod PI, PASP 36.    . Bifascicular block   . Hyperlipidemia     Current Outpatient Prescriptions  Medication Sig Dispense Refill  . aspirin 81 MG EC tablet Take 81 mg by mouth daily.        . celecoxib (CELEBREX) 200 MG capsule Take 200 mg by mouth at bedtime.       . ciprofloxacin (CIPRO)  250 MG tablet Take 1 tablet by mouth 2 (two) times daily.      . ferrous gluconate (FERGON) 325 MG tablet Take 325 mg by mouth daily with breakfast.      . fluconazole (DIFLUCAN) 150 MG tablet Take 1 tablet by mouth as directed.      Marland Kitchen LUMIGAN 0.01 % SOLN       . Multiple Vitamin (MULTIVITAMIN) tablet Take 1 tablet by mouth daily.        . Olmesartan-Amlodipine-HCTZ (TRIBENZOR) 40-5-25 MG TABS Take 1 tablet by mouth daily after breakfast.       . Polyethyl Glycol-Propyl Glycol (SYSTANE) 0.4-0.3 % SOLN Apply to eye as directed.      . tamoxifen (NOLVADEX) 20 MG tablet Take 1 tablet (20 mg total) by mouth daily.  30 tablet  6   No current facility-administered medications for this visit.    Allergies:   Milk-related compounds   Social History:  The patient  reports that she has never smoked. She does not have any smokeless tobacco history on file. She reports that she does  not drink alcohol or use illicit drugs.   Family History:  The patient's family history includes Diabetes in her father and mother; Other in her father and mother.   ROS:  Please see the history of present illness.  She has a chronic cough.   All other systems reviewed and negative.   PHYSICAL EXAM: VS:  BP 142/82  Pulse 73  Ht 4\' 9"  (1.448 m)  Wt 115 lb (52.164 kg)  BMI 24.88 kg/m2 Well nourished, well developed, in no acute distress HEENT: normal Neck: no JVD Cardiac:  normal S1, S2; RRR; 2/6 diastolic murmur along the 3rd LICS Lungs:  clear to auscultation bilaterally, no wheezing, rhonchi or rales Abd: soft, nontender, no hepatomegaly Ext: no edema Skin: warm and dry Neuro:  CNs 2-12 intact, no focal abnormalities noted  EKG:  NSR, HR 73, LAFB, RBBB     ASSESSMENT AND PLAN:  1. Valvular Heart Disease:  Overall appears stable.  She does note DOE and increasing fatigue.  It has been 2 years since her last echo.  Will arrange f/u 2D echo. 2. Hypertension:  She is at goal.  Continue current Rx. 3. Breast CA:   F/u with oncology as planned.  4. Disposition:  F/u with Dr. Marca Ancona in 1 year.   Signed, Tereso Newcomer, PA-C  02/01/2013 2:45 PM

## 2013-02-05 ENCOUNTER — Other Ambulatory Visit: Payer: Self-pay | Admitting: *Deleted

## 2013-02-05 DIAGNOSIS — C50311 Malignant neoplasm of lower-inner quadrant of right female breast: Secondary | ICD-10-CM

## 2013-02-05 MED ORDER — TAMOXIFEN CITRATE 20 MG PO TABS: 20.0000 mg | ORAL_TABLET | Freq: Every day | ORAL | Status: DC

## 2013-02-05 NOTE — Telephone Encounter (Signed)
Patient called reporting her tamoxifen refill has not been responded to.  No records of a refill request found but this nurse refilled at this time.

## 2013-05-28 ENCOUNTER — Encounter: Payer: Self-pay | Admitting: Physician Assistant

## 2013-05-28 ENCOUNTER — Telehealth: Payer: Self-pay | Admitting: *Deleted

## 2013-05-28 ENCOUNTER — Ambulatory Visit (HOSPITAL_COMMUNITY): Payer: Medicare HMO | Attending: Cardiovascular Disease | Admitting: Cardiology

## 2013-05-28 DIAGNOSIS — I359 Nonrheumatic aortic valve disorder, unspecified: Secondary | ICD-10-CM | POA: Insufficient documentation

## 2013-05-28 DIAGNOSIS — I38 Endocarditis, valve unspecified: Secondary | ICD-10-CM

## 2013-05-28 DIAGNOSIS — I351 Nonrheumatic aortic (valve) insufficiency: Secondary | ICD-10-CM

## 2013-05-28 NOTE — Progress Notes (Signed)
Echo performed. 

## 2013-05-28 NOTE — Telephone Encounter (Signed)
pt notified about echo results with verbal understanding to results 

## 2013-07-09 ENCOUNTER — Encounter (HOSPITAL_COMMUNITY): Payer: Self-pay | Admitting: Emergency Medicine

## 2013-07-09 ENCOUNTER — Emergency Department (HOSPITAL_COMMUNITY): Payer: Medicare HMO

## 2013-07-09 ENCOUNTER — Inpatient Hospital Stay (HOSPITAL_COMMUNITY): Payer: Medicare HMO

## 2013-07-09 ENCOUNTER — Inpatient Hospital Stay (HOSPITAL_COMMUNITY)
Admission: EM | Admit: 2013-07-09 | Discharge: 2013-07-15 | DRG: 871 | Disposition: A | Discharge status: Home / Self Care | Payer: Medicare HMO | Attending: Internal Medicine | Admitting: Internal Medicine

## 2013-07-09 DIAGNOSIS — J9600 Acute respiratory failure, unspecified whether with hypoxia or hypercapnia: Secondary | ICD-10-CM | POA: Diagnosis present

## 2013-07-09 DIAGNOSIS — J9601 Acute respiratory failure with hypoxia: Secondary | ICD-10-CM | POA: Diagnosis present

## 2013-07-09 DIAGNOSIS — Z833 Family history of diabetes mellitus: Secondary | ICD-10-CM

## 2013-07-09 DIAGNOSIS — M545 Low back pain, unspecified: Secondary | ICD-10-CM | POA: Diagnosis present

## 2013-07-09 DIAGNOSIS — J189 Pneumonia, unspecified organism: Secondary | ICD-10-CM | POA: Diagnosis present

## 2013-07-09 DIAGNOSIS — D696 Thrombocytopenia, unspecified: Secondary | ICD-10-CM | POA: Diagnosis present

## 2013-07-09 DIAGNOSIS — R64 Cachexia: Secondary | ICD-10-CM | POA: Diagnosis present

## 2013-07-09 DIAGNOSIS — K029 Dental caries, unspecified: Secondary | ICD-10-CM | POA: Diagnosis present

## 2013-07-09 DIAGNOSIS — J852 Abscess of lung without pneumonia: Secondary | ICD-10-CM | POA: Diagnosis present

## 2013-07-09 DIAGNOSIS — I498 Other specified cardiac arrhythmias: Secondary | ICD-10-CM | POA: Diagnosis present

## 2013-07-09 DIAGNOSIS — E785 Hyperlipidemia, unspecified: Secondary | ICD-10-CM | POA: Diagnosis present

## 2013-07-09 DIAGNOSIS — E876 Hypokalemia: Secondary | ICD-10-CM | POA: Diagnosis present

## 2013-07-09 DIAGNOSIS — E871 Hypo-osmolality and hyponatremia: Secondary | ICD-10-CM | POA: Diagnosis present

## 2013-07-09 DIAGNOSIS — I452 Bifascicular block: Secondary | ICD-10-CM | POA: Diagnosis present

## 2013-07-09 DIAGNOSIS — Z96659 Presence of unspecified artificial knee joint: Secondary | ICD-10-CM

## 2013-07-09 DIAGNOSIS — I10 Essential (primary) hypertension: Secondary | ICD-10-CM | POA: Diagnosis present

## 2013-07-09 DIAGNOSIS — D72829 Elevated white blood cell count, unspecified: Secondary | ICD-10-CM | POA: Diagnosis present

## 2013-07-09 DIAGNOSIS — I38 Endocarditis, valve unspecified: Secondary | ICD-10-CM | POA: Diagnosis present

## 2013-07-09 DIAGNOSIS — Z853 Personal history of malignant neoplasm of breast: Secondary | ICD-10-CM

## 2013-07-09 DIAGNOSIS — R042 Hemoptysis: Secondary | ICD-10-CM | POA: Diagnosis present

## 2013-07-09 DIAGNOSIS — Z7982 Long term (current) use of aspirin: Secondary | ICD-10-CM

## 2013-07-09 DIAGNOSIS — J984 Other disorders of lung: Secondary | ICD-10-CM | POA: Diagnosis present

## 2013-07-09 DIAGNOSIS — A150 Tuberculosis of lung: Secondary | ICD-10-CM | POA: Diagnosis present

## 2013-07-09 DIAGNOSIS — A419 Sepsis, unspecified organism: Principal | ICD-10-CM | POA: Diagnosis present

## 2013-07-09 DIAGNOSIS — E440 Moderate protein-calorie malnutrition: Secondary | ICD-10-CM | POA: Diagnosis present

## 2013-07-09 LAB — I-STAT ARTERIAL BLOOD GAS, ED
Acid-base deficit: 5 mmol/L — ABNORMAL HIGH (ref 0.0–2.0)
BICARBONATE: 19.1 meq/L — AB (ref 20.0–24.0)
O2 Saturation: 95 %
PH ART: 7.376 (ref 7.350–7.450)
TCO2: 20 mmol/L (ref 0–100)
pCO2 arterial: 32.6 mmHg — ABNORMAL LOW (ref 35.0–45.0)
pO2, Arterial: 78 mmHg — ABNORMAL LOW (ref 80.0–100.0)

## 2013-07-09 LAB — COMPREHENSIVE METABOLIC PANEL
ALT: 11 U/L (ref 0–35)
AST: 18 U/L (ref 0–37)
Albumin: 2.9 g/dL — ABNORMAL LOW (ref 3.5–5.2)
Alkaline Phosphatase: 75 U/L (ref 39–117)
BUN: 26 mg/dL — ABNORMAL HIGH (ref 6–23)
CO2: 22 mEq/L (ref 19–32)
Calcium: 9.2 mg/dL (ref 8.4–10.5)
Chloride: 94 mEq/L — ABNORMAL LOW (ref 96–112)
Creatinine, Ser: 0.96 mg/dL (ref 0.50–1.10)
GFR calc Af Amer: 62 mL/min — ABNORMAL LOW (ref >90)
GFR calc non Af Amer: 53 mL/min — ABNORMAL LOW (ref >90)
Glucose, Bld: 243 mg/dL — ABNORMAL HIGH (ref 70–99)
Potassium: 3.4 mEq/L — ABNORMAL LOW (ref 3.7–5.3)
Sodium: 132 mEq/L — ABNORMAL LOW (ref 137–147)
Total Bilirubin: 0.3 mg/dL (ref 0.3–1.2)
Total Protein: 7.1 g/dL (ref 6.0–8.3)

## 2013-07-09 LAB — CBC WITH DIFFERENTIAL/PLATELET
BASOS PCT: 0 % (ref 0–1)
Basophils Absolute: 0 10*3/uL (ref 0.0–0.1)
Eosinophils Absolute: 0 10*3/uL (ref 0.0–0.7)
Eosinophils Relative: 0 % (ref 0–5)
HEMATOCRIT: 33.1 % — AB (ref 36.0–46.0)
Hemoglobin: 11.2 g/dL — ABNORMAL LOW (ref 12.0–15.0)
Lymphocytes Relative: 2 % — ABNORMAL LOW (ref 12–46)
Lymphs Abs: 0.5 10*3/uL — ABNORMAL LOW (ref 0.7–4.0)
MCH: 23.1 pg — ABNORMAL LOW (ref 26.0–34.0)
MCHC: 33.8 g/dL (ref 30.0–36.0)
MCV: 68.4 fL — AB (ref 78.0–100.0)
MONOS PCT: 5 % (ref 3–12)
Monocytes Absolute: 1.3 10*3/uL — ABNORMAL HIGH (ref 0.1–1.0)
NEUTROS ABS: 24.6 10*3/uL — AB (ref 1.7–7.7)
Neutrophils Relative %: 93 % — ABNORMAL HIGH (ref 43–77)
Platelets: DECREASED 10*3/uL (ref 150–400)
RBC: 4.84 MIL/uL (ref 3.87–5.11)
RDW: 13.6 % (ref 11.5–15.5)
WBC: 26.4 10*3/uL — AB (ref 4.0–10.5)

## 2013-07-09 LAB — MRSA PCR SCREENING: MRSA BY PCR: NEGATIVE

## 2013-07-09 LAB — URINALYSIS, ROUTINE W REFLEX MICROSCOPIC
Bilirubin Urine: NEGATIVE
GLUCOSE, UA: NEGATIVE mg/dL
KETONES UR: NEGATIVE mg/dL
Leukocytes, UA: NEGATIVE
Nitrite: NEGATIVE
PROTEIN: NEGATIVE mg/dL
Specific Gravity, Urine: 1.01 (ref 1.005–1.030)
Urobilinogen, UA: 0.2 mg/dL (ref 0.0–1.0)
pH: 5.5 (ref 5.0–8.0)

## 2013-07-09 LAB — URINE MICROSCOPIC-ADD ON

## 2013-07-09 LAB — TSH: TSH: 0.744 u[IU]/mL (ref 0.350–4.500)

## 2013-07-09 LAB — I-STAT CG4 LACTIC ACID, ED: Lactic Acid, Venous: 2.2 mmol/L (ref 0.5–2.2)

## 2013-07-09 LAB — PRO B NATRIURETIC PEPTIDE: PRO B NATRI PEPTIDE: 596.8 pg/mL — AB (ref 0–450)

## 2013-07-09 MED ORDER — ASPIRIN EC 81 MG PO TBEC
81.0000 mg | DELAYED_RELEASE_TABLET | Freq: Every day | ORAL | Status: DC
  Administered 2013-07-09 – 2013-07-15 (×7): 81 mg via ORAL
  Filled 2013-07-09 (×7): qty 1

## 2013-07-09 MED ORDER — ACETAMINOPHEN 650 MG RE SUPP: 650.0000 mg | Freq: Four times a day (QID) | RECTAL | Status: DC | PRN

## 2013-07-09 MED ORDER — IOHEXOL 300 MG/ML  SOLN
80.0000 mL | Freq: Once | INTRAMUSCULAR | Status: AC | PRN
  Administered 2013-07-09: 80 mL via INTRAVENOUS

## 2013-07-09 MED ORDER — SODIUM CHLORIDE 0.9 % IV SOLN: 250.0000 mL | INTRAVENOUS | Status: DC | PRN

## 2013-07-09 MED ORDER — SODIUM CHLORIDE 0.9 % IV SOLN: INTRAVENOUS | Status: DC

## 2013-07-09 MED ORDER — SODIUM CHLORIDE 0.9 % IV SOLN
1000.0000 mL | Freq: Once | INTRAVENOUS | Status: AC
  Administered 2013-07-09: 1000 mL via INTRAVENOUS

## 2013-07-09 MED ORDER — PNEUMOCOCCAL VAC POLYVALENT 25 MCG/0.5ML IJ INJ
0.5000 mL | INJECTION | INTRAMUSCULAR | Status: AC
  Administered 2013-07-12: 0.5 mL via INTRAMUSCULAR
  Filled 2013-07-09 (×3): qty 0.5

## 2013-07-09 MED ORDER — PIPERACILLIN-TAZOBACTAM 3.375 G IVPB 30 MIN
3.3750 g | Freq: Once | INTRAVENOUS | Status: AC
  Administered 2013-07-09: 3.375 g via INTRAVENOUS
  Filled 2013-07-09: qty 50

## 2013-07-09 MED ORDER — HYDROCODONE-ACETAMINOPHEN 5-325 MG PO TABS
1.0000 | ORAL_TABLET | ORAL | Status: DC | PRN
Start: 2013-07-09 — End: 2013-07-15

## 2013-07-09 MED ORDER — ACETAMINOPHEN 500 MG PO TABS
1000.0000 mg | ORAL_TABLET | Freq: Once | ORAL | Status: AC
  Administered 2013-07-09: 1000 mg via ORAL
  Filled 2013-07-09: qty 2

## 2013-07-09 MED ORDER — ALBUTEROL SULFATE (2.5 MG/3ML) 0.083% IN NEBU: 2.5000 mg | INHALATION_SOLUTION | RESPIRATORY_TRACT | Status: DC | PRN

## 2013-07-09 MED ORDER — FUROSEMIDE 10 MG/ML IJ SOLN
40.0000 mg | Freq: Once | INTRAMUSCULAR | Status: AC
  Administered 2013-07-09: 40 mg via INTRAVENOUS
  Filled 2013-07-09: qty 4

## 2013-07-09 MED ORDER — VANCOMYCIN HCL IN DEXTROSE 750-5 MG/150ML-% IV SOLN: 750.0000 mg | INTRAVENOUS | Status: DC

## 2013-07-09 MED ORDER — ASPIRIN 81 MG PO TBEC: 81.0000 mg | DELAYED_RELEASE_TABLET | Freq: Every day | ORAL | Status: DC

## 2013-07-09 MED ORDER — ACETAMINOPHEN 325 MG PO TABS
650.0000 mg | ORAL_TABLET | Freq: Four times a day (QID) | ORAL | Status: DC | PRN
  Administered 2013-07-10 – 2013-07-11 (×2): 650 mg via ORAL
  Filled 2013-07-09 (×2): qty 2

## 2013-07-09 MED ORDER — POLYETHYL GLYCOL-PROPYL GLYCOL 0.4-0.3 % OP SOLN: 1.0000 [drp] | OPHTHALMIC | Status: DC

## 2013-07-09 MED ORDER — POLYVINYL ALCOHOL 1.4 % OP SOLN
1.0000 [drp] | OPHTHALMIC | Status: DC | PRN
  Administered 2013-07-11: 1 [drp] via OPHTHALMIC
  Filled 2013-07-09: qty 15

## 2013-07-09 MED ORDER — DEXTROSE 5 % IV SOLN
1.0000 g | INTRAVENOUS | Status: DC
  Administered 2013-07-09: 1 g via INTRAVENOUS
  Filled 2013-07-09: qty 10

## 2013-07-09 MED ORDER — SODIUM CHLORIDE 0.9 % IJ SOLN: 3.0000 mL | INTRAMUSCULAR | Status: DC | PRN

## 2013-07-09 MED ORDER — VANCOMYCIN HCL IN DEXTROSE 750-5 MG/150ML-% IV SOLN
750.0000 mg | INTRAVENOUS | Status: DC
  Administered 2013-07-10 – 2013-07-12 (×3): 750 mg via INTRAVENOUS
  Filled 2013-07-09 (×3): qty 150

## 2013-07-09 MED ORDER — SODIUM CHLORIDE 0.9 % IJ SOLN
3.0000 mL | Freq: Two times a day (BID) | INTRAMUSCULAR | Status: DC
  Administered 2013-07-09 – 2013-07-11 (×3): 3 mL via INTRAVENOUS

## 2013-07-09 MED ORDER — DEXTROSE 5 % IV SOLN
500.0000 mg | INTRAVENOUS | Status: DC
  Administered 2013-07-09: 500 mg via INTRAVENOUS
  Filled 2013-07-09: qty 500

## 2013-07-09 MED ORDER — POTASSIUM CHLORIDE CRYS ER 20 MEQ PO TBCR
40.0000 meq | EXTENDED_RELEASE_TABLET | Freq: Two times a day (BID) | ORAL | Status: AC
  Administered 2013-07-09 (×2): 40 meq via ORAL
  Filled 2013-07-09 (×2): qty 2

## 2013-07-09 MED ORDER — LATANOPROST 0.005 % OP SOLN
1.0000 [drp] | Freq: Every day | OPHTHALMIC | Status: DC
  Administered 2013-07-09 – 2013-07-14 (×6): 1 [drp] via OPHTHALMIC
  Filled 2013-07-09 (×2): qty 2.5

## 2013-07-09 MED ORDER — VANCOMYCIN HCL IN DEXTROSE 1-5 GM/200ML-% IV SOLN
1000.0000 mg | Freq: Once | INTRAVENOUS | Status: AC
  Administered 2013-07-09: 1000 mg via INTRAVENOUS
  Filled 2013-07-09: qty 200

## 2013-07-09 MED ORDER — SODIUM CHLORIDE 0.9 % IV SOLN
1000.0000 mL | INTRAVENOUS | Status: DC
  Administered 2013-07-09 (×2): 1000 mL via INTRAVENOUS

## 2013-07-09 MED ORDER — SODIUM CHLORIDE 0.9 % IJ SOLN
3.0000 mL | Freq: Two times a day (BID) | INTRAMUSCULAR | Status: DC
  Administered 2013-07-09 – 2013-07-13 (×5): 3 mL via INTRAVENOUS

## 2013-07-09 MED ORDER — ALBUTEROL SULFATE (2.5 MG/3ML) 0.083% IN NEBU
2.5000 mg | INHALATION_SOLUTION | Freq: Four times a day (QID) | RESPIRATORY_TRACT | Status: DC
  Administered 2013-07-09: 2.5 mg via RESPIRATORY_TRACT
  Filled 2013-07-09: qty 3

## 2013-07-09 MED ORDER — ALBUTEROL SULFATE (2.5 MG/3ML) 0.083% IN NEBU
2.5000 mg | INHALATION_SOLUTION | Freq: Two times a day (BID) | RESPIRATORY_TRACT | Status: DC
  Administered 2013-07-10 – 2013-07-13 (×7): 2.5 mg via RESPIRATORY_TRACT
  Filled 2013-07-09 (×8): qty 3

## 2013-07-09 MED ORDER — SODIUM CHLORIDE 0.9 % IV BOLUS (SEPSIS)
500.0000 mL | Freq: Once | INTRAVENOUS | Status: AC
  Administered 2013-07-09: 500 mL via INTRAVENOUS

## 2013-07-09 MED ORDER — PIPERACILLIN-TAZOBACTAM 3.375 G IVPB
3.3750 g | Freq: Three times a day (TID) | INTRAVENOUS | Status: DC
  Administered 2013-07-09: 3.375 g via INTRAVENOUS
  Filled 2013-07-09 (×3): qty 50

## 2013-07-09 MED ORDER — TAMOXIFEN CITRATE 10 MG PO TABS
20.0000 mg | ORAL_TABLET | Freq: Every day | ORAL | Status: DC
  Administered 2013-07-09 – 2013-07-15 (×7): 20 mg via ORAL
  Filled 2013-07-09 (×7): qty 2

## 2013-07-09 NOTE — Progress Notes (Signed)
Pt received from Ed per stretcher oriented to room VS done, CHG bath and MRSA swab. Alert/oriented. Denies pain.

## 2013-07-09 NOTE — Progress Notes (Signed)
ANTIBIOTIC CONSULT NOTE - INITIAL  Pharmacy Consult for vancomycin Indication: pneumonia  Allergies  Allergen Reactions  . Milk-Related Compounds Other (See Comments)    Frequent bowel movements     Patient Measurements: Height: 4\' 9"  (144.8 cm) Weight: 117 lb (53.071 kg) IBW/kg (Calculated) : 38.6 Adjusted Body Weight:   Vital Signs: Temp: 102.8 F (39.3 C) (05/01 1738) Temp src: Oral (05/01 1738) BP: 124/60 mmHg (05/01 1800) Pulse Rate: 103 (05/01 1800) Intake/Output from previous day:   Intake/Output from this shift: Total I/O In: -  Out: 2500 [Urine:2500]  Labs:  Recent Labs  07/09/13 0735  WBC 26.4*  HGB 11.2*  PLT PLATELET CLUMPS NOTED ON SMEAR, COUNT APPEARS DECREASED  CREATININE 0.96   Estimated Creatinine Clearance: 31.1 ml/min (by C-G formula based on Cr of 0.96). No results found for this basename: VANCOTROUGH, VANCOPEAK, VANCORANDOM, GENTTROUGH, GENTPEAK, GENTRANDOM, TOBRATROUGH, TOBRAPEAK, TOBRARND, AMIKACINPEAK, AMIKACINTROU, AMIKACIN,  in the last 72 hours   Microbiology: No results found for this or any previous visit (from the past 720 hour(s)).  Medical History: Past Medical History  Diagnosis Date  . Rheumatism   . Back pain   . Hypertension   . Palpitations     a. holter (7/11):  isolated PVCs and PACs  . Breast cancer     a. DCIS s/p R lumpectomy  . Anxiety   . Arthritis     celebrex use off & on for arthritis- hands, back   . Valvular heart disease     a. Echo (7/11):  EF 55-60%, Gr 1 DD, mild-mod AI, mild-mod MR, mod PI, PASP 30;  b. Echo (09/2010):  Mild LVH, EF 60%, Gr 1 DD, mild to mod AI, mild MR, mild TR, mild to mod PI, PASP 36.;   c.  Echo (05/2013):  EF 60-65%, no RWMA, mild AI, mild to mod PI   . Bifascicular block   . Hyperlipidemia     Medications:  Scheduled:  . albuterol  2.5 mg Nebulization Q6H  . aspirin  81 mg Oral Daily  . azithromycin  500 mg Intravenous Q24H  . cefTRIAXone (ROCEPHIN)  IV  1 g Intravenous  Q24H  . latanoprost  1 drop Right Eye QHS  . Polyethyl Glycol-Propyl Glycol  1 drop Ophthalmic UD  . potassium chloride  40 mEq Oral BID  . sodium chloride  3 mL Intravenous Q12H  . sodium chloride  3 mL Intravenous Q12H  . tamoxifen  20 mg Oral Daily   Infusions:   Assessment: [name] will be continued on vancomycin for pneumonia.   Vancomycin 1000mg  IV x1 and Zosyn 3.375g IV x1 were ordered by EDP.  Blood cultures have been sent.  Scr 0.96 with est CrCl ~32mL/min   Goal of Therapy:  Vancomycin trough level 15-20 mcg/ml   Plan:  1. Vancomycin 750mg  IV q24h starting 5/2 as dose was given in ED  2. Follow c/s, clinical progression, renal function, trough at SS   Brianna Hunter 07/09/2013,6:59 PM

## 2013-07-09 NOTE — Progress Notes (Signed)
Pt. Room assignment declined due to lack of having an empty neg pressure room. Spoke with charge nurse and made aware.

## 2013-07-09 NOTE — ED Notes (Signed)
EDP called code sepsis.

## 2013-07-09 NOTE — ED Notes (Signed)
Attempted report 

## 2013-07-09 NOTE — H&P (Signed)
[facility] History and Physical  Brianna Hunter XLK:[id number] DOB: 1929/02/07 DOA: 07/09/2013  Referring physician: Dr. Dorna Mai PCP: [name], MD   Chief Complaint: Cough and SOB  HPI: Brianna Hunter is a 80 y.o. female  80 year old female with past medical history of hypertension, breast cancer, valvular heart disease, bifascicular block that comes in for back pain she was brought here by EMS and was complaining of back pain. She arrived to the emergency room she was tachycardic. She has been complaining of cough and shortness of breath the patient is very tangential in her history and cannot give a straight history. She relates and fever this started the day prior to admission and hemoptysis about a teaspoon with every cough the patient is asked to return she coughs she feels better.  In the ED: Basic metabolic panel was done this shows hyponatremia CBC shows a white count of 26 with a left shift the chest x-ray was done as below. She was found to be tachycardic when she arrived in the ED she was started on IV fluids she was given 2 L of normal saline and her tachycardia improved. She then proceeded to become tachycardic she relates her shortness of breath has not improved. An ABG was done that showed 7.37/32 and 78.  Review of Systems:  Constitutional:  No weight loss, night sweats, Fevers, chills, fatigue.  HEENT:  No headaches, Difficulty swallowing,Tooth/dental problems,Sore throat,  No sneezing, itching, ear ache, nasal congestion, post nasal drip,  Cardio-vascular:  No chest pain, Orthopnea, PND, swelling in lower extremities, anasarca, dizziness, palpitations  GI:  No heartburn, indigestion, abdominal pain, nausea, vomiting, diarrhea, change in bowel habits, loss of appetite  Skin:  no rash or lesions.  GU:  no dysuria, change in color of urine, no urgency or frequency. No flank pain.  Musculoskeletal:  No joint pain or swelling. No decreased range of motion. No  back pain.  Psych:  No change in mood or affect. No depression or anxiety. No memory loss.   Past Medical History  Diagnosis Date  . Rheumatism   . Back pain   . Hypertension   . Palpitations     a. holter (7/11):  isolated PVCs and PACs  . Breast cancer     a. DCIS s/p R lumpectomy  . Anxiety   . Arthritis     celebrex use off & on for arthritis- hands, back   . Valvular heart disease     a. Echo (7/11):  EF 55-60%, Gr 1 DD, mild-mod AI, mild-mod MR, mod PI, PASP 30;  b. Echo (09/2010):  Mild LVH, EF 60%, Gr 1 DD, mild to mod AI, mild MR, mild TR, mild to mod PI, PASP 36.;   c.  Echo (05/2013):  EF 60-65%, no RWMA, mild AI, mild to mod PI   . Bifascicular block   . Hyperlipidemia    Past Surgical History  Procedure Laterality Date  . Knee surgery      Left  . Shoulder surgery      Left  . Joint replacement      2008- L knee  . Appendectomy    . Abdominal hysterectomy      partial   . Eye surgery      left eye 2013  . Ear cyst excision  11/07/2011    Procedure: CYST REMOVAL;  Surgeon: Madilyn Hook, DO;  Location: [facility];  Service: General;  Laterality: Right;  Removal Cyst Right Upper Chest   Social History:  reports that she has never smoked. She does not have any smokeless tobacco history on file. She reports that she does not drink alcohol or use illicit drugs.  Allergies  Allergen Reactions  . Milk-Related Compounds Other (See Comments)    Frequent bowel movements     Family History  Problem Relation Age of Onset  . Diabetes Mother   . Other Mother     Bleeding disorder  . Diabetes Father   . Other Father     Bleeding disorder     Prior to Admission medications   Medication Sig Start Date End Date Taking? Authorizing Provider  aspirin 81 MG EC tablet Take 81 mg by mouth daily.     Yes Historical Provider, MD  celecoxib (CELEBREX) 200 MG capsule Take 200 mg by mouth at bedtime.    Yes Historical Provider, MD  EAR WAX REMOVAL DROPS 6.5 % otic solution Place 5  drops into both ears 2 (two) times daily as needed (for ears).  06/10/13  Yes Historical Provider, MD  ferrous gluconate (FERGON) 325 MG tablet Take 325 mg by mouth daily with breakfast.   Yes Historical Provider, MD  LUMIGAN 0.01 % SOLN Place 1 drop into the right eye at bedtime.  07/25/12  Yes Historical Provider, MD  Multiple Vitamin (MULTIVITAMIN) tablet Take 1 tablet by mouth daily.     Yes Historical Provider, MD  Olmesartan-Amlodipine-HCTZ (TRIBENZOR) 40-5-25 MG TABS Take 1 tablet by mouth daily after breakfast.    Yes Historical Provider, MD  Polyethyl Glycol-Propyl Glycol (SYSTANE) 0.4-0.3 % SOLN Apply 1 drop to eye as directed.    Yes Historical Provider, MD  tamoxifen (NOLVADEX) 20 MG tablet Take 1 tablet (20 mg total) by mouth daily. 02/05/13  Yes Deatra Robinson, MD  Vitamins A & D (VITAMIN A & D) 10000-400 UNITS CAPS Take 1 capsule by mouth daily.  06/10/13  Yes Historical Provider, MD   Physical Exam: Filed Vitals:   07/09/13 1405  BP:   Pulse:   Temp: 98.5 F (36.9 C)  Resp:     BP 113/55  Pulse 85  Temp(Src) 98.5 F (36.9 C) (Oral)  Resp 29  Ht 4\' 9"  (1.448 m)  Wt 53.071 kg (117 lb)  BMI 25.31 kg/m2  SpO2 100%  General:  Appears calm and comfortable , cachectic appearing Eyes: PERRL, normal lids, irises & conjunctiva ENT: grossly normal hearing, lips & tongue positive JVD to the earlobe Neck: no LAD, masses or thyromegaly Cardiovascular: RRR, no m/r/g. No LE edema. Respiratory: Moderate air movement with wheezing bilaterally. Abdomen: soft, ntnd Skin: no rash or induration seen on limited exam Musculoskeletal: grossly normal tone BUE/BLE Neurologic: grossly non-focal.          Labs on Admission:  Basic Metabolic Panel:  Recent Labs Lab 07/09/13 0735  NA 132*  K 3.4*  CL 94*  CO2 22  GLUCOSE 243*  BUN 26*  CREATININE 0.96  CALCIUM 9.2   Liver Function Tests:  Recent Labs Lab 07/09/13 0735  AST 18  ALT 11  ALKPHOS 75  BILITOT 0.3  PROT 7.1    ALBUMIN 2.9*   No results found for this basename: LIPASE, AMYLASE,  in the last 168 hours No results found for this basename: AMMONIA,  in the last 168 hours CBC:  Recent Labs Lab 07/09/13 0735  WBC 26.4*  NEUTROABS 24.6*  HGB 11.2*  HCT 33.1*  MCV 68.4*  PLT PLATELET CLUMPS NOTED ON SMEAR, COUNT APPEARS DECREASED  Cardiac Enzymes: No results found for this basename: CKTOTAL, CKMB, CKMBINDEX, TROPONINI,  in the last 168 hours  BNP (last 3 results)  Recent Labs  07/09/13 0741  PROBNP 596.8*   CBG: No results found for this basename: GLUCAP,  in the last 168 hours  Radiological Exams on Admission: Dg Chest Port 1 View  07/09/2013   CLINICAL DATA:  Chest and back pain, shortness of breath, history hypertension, breast cancer, hyperlipidemia, heart murmur  EXAM: PORTABLE CHEST - 1 VIEW  COMPARISON:  Portable exam 0742 hr compared to radiographs and CT of 06/02/2012  FINDINGS: Enlargement of cardiac silhouette.  Atherosclerotic calcification aorta.  Mediastinal contours and pulmonary vascularity normal.  Cavitary the opacity/mass RIGHT upper lobe 5.3 x 5.0 cm in size.  This could represent a cavitary neoplasm or a lung abscess.  Remaining lungs clear.  Mild elevation of RIGHT diaphragm.  No pleural effusion or pneumothorax.  BILATERAL chronic rotator cuff tears with degenerative changes RIGHT glenohumeral joint.  Osseous demineralization.  IMPRESSION: Cavitary mass RIGHT upper lobe 5.3 x 5.0 cm in size question cavitary neoplasm or lung abscess.  Further evaluation by CT chest with IV contrast recommended.   Electronically Signed   By: Lavonia Dana M.D.   On: 07/09/2013 07:56    EKG: Independently reviewed. Sinus tach with RBBB  Assessment/Plan Hemoptysis/  Leukocytosis/Sepsis/  Cavitary lesion of lung: - She was given 2 L of IV fluids in the emergency room, and her tachycardia improved. She is now in sinus tachycardia and has positive JVD. As she was continuing normal saline at  125 an hour. Her saturations have remained stable. No cord head and stop her IV fluids. We'll give her a small dose of Lasix. - We will obtain sputum cultures and blood cultures, will get a CT scan of the chest to evaluate her cavitary lung lesion. I will agree with continuing vancomycin and add Rocephin and azithromycin. - She's not on an as submitted medication at home which can cause her to aspirate and then produce an abscess there is no travel history. No history of exposure to TB. - We'll get a serum quantity. - Check a swallowing evaluation.  Volume overload: - She is JVD on physical exam appreciated crackles. I will go ahead and give her small dose of Lasix.  Sinus tachycardia: - I think is most likely secondary to fluid overload at this point. She does have positive JVD no murmur extremity edema lungs have appreciated crackles. We'll give her a small dose of Lasix.  Hyponatremia: - Most likely secondary to sepsis syndrome, she was given IV fluid I was fluid overloaded. Her creatinine seems to be at baseline. - At this point I don't think urinary sodium and urinary creatinine and we have she is on 2 L of normal saline. We'll go ahead and repeat a basic metabolic panel in the morning. Follow strict I.'s and O.'s. - She has a Foley in place her urine is very diluted   Code Status: presumed full Family Communication: none  Disposition Plan: inpatient  Time spent: 80 minutes  [facility] Pager [phone]

## 2013-07-09 NOTE — ED Notes (Signed)
Daughter at bedside.

## 2013-07-09 NOTE — ED Notes (Signed)
Pt c/o right sided back pain onset Wednesday that radiates around to right flank area. Pt has tried heat, tylenol and stretching with some relief. Pt HR 130's for EMS and Resp in the 40's after ambulation. Pt reports shortness of breath with exertion but that is normal for her.

## 2013-07-09 NOTE — ED Notes (Signed)
Entered patient room and brought IV supplies and IV fluid into room without face mask on.

## 2013-07-09 NOTE — ED Notes (Signed)
Admitting MD at bedside for consult.  Pt eating lunch, reports lessening shortness of breath, but has difficulty talking and eating.  Audible wheezes noted.

## 2013-07-09 NOTE — ED Provider Notes (Signed)
CSN: [id number]     Arrival date & time 07/09/13  0713 History   First MD Initiated Contact with Patient 07/09/13 [phone]     Chief Complaint  Patient presents with  . Back Pain     (Consider location/radiation/quality/duration/timing/severity/associated sxs/prior Treatment) Patient is a 80 y.o. female presenting with back pain. The history is provided by the nursing home and the EMS personnel. The history is limited by the condition of the patient.  Back Pain  level V caveat applies due to confusion. Patient called EMS for back pain. The patient presents to clinic tachycardic febrile beats sepsis criteria. Patient did make mention that she's had a fever but doesn't know for how long. Patient states that she's always short of breath. Patient is not hypotensive. We'll initiate septic protocols.  Past Medical History  Diagnosis Date  . Rheumatism   . Back pain   . Hypertension   . Palpitations     a. holter (7/11):  isolated PVCs and PACs  . Breast cancer     a. DCIS s/p R lumpectomy  . Anxiety   . Arthritis     celebrex use off & on for arthritis- hands, back   . Valvular heart disease     a. Echo (7/11):  EF 55-60%, Gr 1 DD, mild-mod AI, mild-mod MR, mod PI, PASP 30;  b. Echo (09/2010):  Mild LVH, EF 60%, Gr 1 DD, mild to mod AI, mild MR, mild TR, mild to mod PI, PASP 36.;   c.  Echo (05/2013):  EF 60-65%, no RWMA, mild AI, mild to mod PI   . Bifascicular block   . Hyperlipidemia    Past Surgical History  Procedure Laterality Date  . Knee surgery      Left  . Shoulder surgery      Left  . Joint replacement      2008- L knee  . Appendectomy    . Abdominal hysterectomy      partial   . Eye surgery      left eye 2013  . Ear cyst excision  11/07/2011    Procedure: CYST REMOVAL;  Surgeon: Madilyn Hook, DO;  Location: [facility];  Service: General;  Laterality: Right;  Removal Cyst Right Upper Chest   Family History  Problem Relation Age of Onset  . Diabetes Mother   . Other Mother      Bleeding disorder  . Diabetes Father   . Other Father     Bleeding disorder   History  Substance Use Topics  . Smoking status: Never Smoker   . Smokeless tobacco: Not on file  . Alcohol Use: No   OB History   Grav Para Term Preterm Abortions TAB SAB Ect Mult Living                 Review of Systems  Unable to perform ROS Musculoskeletal: Positive for back pain.   level V caveat applies patient has confusion.    Allergies  Milk-related compounds  Home Medications   Prior to Admission medications   Medication Sig Start Date End Date Taking? Authorizing Provider  aspirin 81 MG EC tablet Take 81 mg by mouth daily.      Historical Provider, MD  celecoxib (CELEBREX) 200 MG capsule Take 200 mg by mouth at bedtime.     Historical Provider, MD  ciprofloxacin (CIPRO) 250 MG tablet Take 1 tablet by mouth 2 (two) times daily. 01/28/13   Historical Provider, MD  ferrous gluconate (FERGON)  325 MG tablet Take 325 mg by mouth daily with breakfast.    Historical Provider, MD  fluconazole (DIFLUCAN) 150 MG tablet Take 1 tablet by mouth as directed. 01/28/13   Historical Provider, MD  LUMIGAN 0.01 % SOLN  07/25/12   Historical Provider, MD  Multiple Vitamin (MULTIVITAMIN) tablet Take 1 tablet by mouth daily.      Historical Provider, MD  Olmesartan-Amlodipine-HCTZ (TRIBENZOR) 40-5-25 MG TABS Take 1 tablet by mouth daily after breakfast.     Historical Provider, MD  Polyethyl Glycol-Propyl Glycol (SYSTANE) 0.4-0.3 % SOLN Apply to eye as directed.    Historical Provider, MD  tamoxifen (NOLVADEX) 20 MG tablet Take 1 tablet (20 mg total) by mouth daily. 02/05/13   Deatra Robinson, MD   BP 126/61  Pulse 122  Temp(Src) 103.1 F (39.5 C) (Oral)  Resp 22  Ht _0  (1.448 m)  Wt 117 lb (53.071 kg)  BMI 25.31 kg/m2  SpO2 97% Physical Exam  Nursing note and vitals reviewed. Constitutional: She appears well-developed and well-nourished. No distress.  Very warm to touch.  HENT:  Head:  Normocephalic and atraumatic.  Mouth/Throat: Oropharynx is clear and moist.  Eyes: Conjunctivae and EOM are normal. Pupils are equal, round, and reactive to light.  Neck: Normal range of motion.  Cardiovascular: Regular rhythm and normal heart sounds.   No murmur heard. Tachycardic  Pulmonary/Chest: Effort normal. She has no wheezes. She has rales. She exhibits no tenderness.  Respiratory rate elevated.  Abdominal: Soft. Bowel sounds are normal. There is no tenderness.  Musculoskeletal: Normal range of motion.  Neurological: She is alert. No cranial nerve deficit. She exhibits normal muscle tone. Coordination normal.  Alert but not oriented seems to be confused not clear has history of dementia or not.  Skin: Skin is warm. No rash noted.    ED Course  Procedures (including critical care time) Labs Review Labs Reviewed  CULTURE, BLOOD (ROUTINE X 2)  CULTURE, BLOOD (ROUTINE X 2)  URINE CULTURE  CBC WITH DIFFERENTIAL  COMPREHENSIVE METABOLIC PANEL  URINALYSIS, ROUTINE W REFLEX MICROSCOPIC  PRO B NATRIURETIC PEPTIDE  I-STAT CG4 LACTIC ACID, ED    Imaging Review Dg Chest Port 1 View  07/09/2013   CLINICAL DATA:  Chest and back pain, shortness of breath, history hypertension, breast cancer, hyperlipidemia, heart murmur  EXAM: PORTABLE CHEST - 1 VIEW  COMPARISON:  Portable exam 0742 hr compared to radiographs and CT of 06/02/2012  FINDINGS: Enlargement of cardiac silhouette.  Atherosclerotic calcification aorta.  Mediastinal contours and pulmonary vascularity normal.  Cavitary the opacity/mass RIGHT upper lobe 5.3 x 5.0 cm in size.  This could represent a cavitary neoplasm or a lung abscess.  Remaining lungs clear.  Mild elevation of RIGHT diaphragm.  No pleural effusion or pneumothorax.  BILATERAL chronic rotator cuff tears with degenerative changes RIGHT glenohumeral joint.  Osseous demineralization.  IMPRESSION: Cavitary mass RIGHT upper lobe 5.3 x 5.0 cm in size question cavitary neoplasm  or lung abscess.  Further evaluation by CT chest with IV contrast recommended.   Electronically Signed   By: Lavonia Dana M.D.   On: 07/09/2013 07:56     EKG Interpretation   Date/Time:  Friday Jul 09 2013 07:32:57 EDT Ventricular Rate:  121 PR Interval:  136 QRS Duration: 141 QT Interval:  344 QTC Calculation: 488 R Axis:   -75 Text Interpretation:  Sinus tachycardia RBBB and LAFB Baseline wander in  lead(s) V4 Confirmed by Eyad Rochford  MD, Hodaya Curto [phone]) on 07/09/2013  7:52:52  AM       CRITICAL CARE Performed by: Mervin Kung Total critical care time: 30 Critical care time was exclusive of separately billable procedures and treating other patients. Critical care was necessary to treat or prevent imminent or life-threatening deterioration. Critical care was time spent personally by me on the following activities: development of treatment plan with patient and/or surrogate as well as nursing, discussions with consultants, evaluation of patient's response to treatment, examination of patient, obtaining history from patient or surrogate, ordering and performing treatments and interventions, ordering and review of laboratory studies, ordering and review of radiographic studies, pulse oximetry and re-evaluation of patient's condition.      MDM   Final diagnoses:  Sepsis    Patient alert but with a degree of confusion. Patient met sepsis criteria upon arrival septic protocol started. Also after cultures broad-spectrum antibiotics Zosyn and bang started. Chest x-ray shows a cavitary lesion in the right upper lung. This could be neoplasm could be TB. Patient improved here with the fluid challenge his heart rate still tachycardic but improved the patient's breathing is improved slightly. Arterial blood gas without any significant concerns no acidosis no elevated PCO2. Patient discussed with critical care not exactly you ready for ICU will be admitted to step down by the hospitalist  service. Patient is protecting her airway she is alert however she is still tachycardic and is still a has shortness of breath and breathing fast. Patient with marked leukocytosis. Renal function normal. Does have a hyperglycemia. Patient's lactic acid was less than 4.    Patient with 2 L of normal saline initiated. Patient also given Tylenol 1 g for the elevated fevers.    Mervin Kung, MD 07/09/13 1016

## 2013-07-09 NOTE — ED Notes (Signed)
Zackowski, MD approved for pt to have heart healthy diet

## 2013-07-09 NOTE — Progress Notes (Signed)
ANTIBIOTIC CONSULT NOTE - INITIAL  Pharmacy Consult for vancomycin and Zosyn Indication: rule out sepsis  Allergies  Allergen Reactions  . Milk-Related Compounds Other (See Comments)    Frequent bowel movements     Patient Measurements: Height: 4\' 9"  (144.8 cm) Weight: 117 lb (53.071 kg) IBW/kg (Calculated) : 38.6  Vital Signs: Temp: 103.1 F (39.5 C) (05/01 0723) Temp src: Oral (05/01 0723) BP: 130/54 mmHg (05/01 0723) Pulse Rate: 124 (05/01 0723) Intake/Output from previous day:   Intake/Output from this shift:    Labs: No results found for this basename: WBC, HGB, PLT, LABCREA, CREATININE,  in the last 72 hours Estimated Creatinine Clearance: 29.9 ml/min (by C-G formula based on Cr of 1). No results found for this basename: VANCOTROUGH, VANCOPEAK, VANCORANDOM, GENTTROUGH, GENTPEAK, GENTRANDOM, TOBRATROUGH, TOBRAPEAK, TOBRARND, AMIKACINPEAK, AMIKACINTROU, AMIKACIN,  in the last 72 hours   Microbiology: No results found for this or any previous visit (from the past 720 hour(s)).  Medical History: Past Medical History  Diagnosis Date  . Rheumatism   . Back pain   . Hypertension   . Palpitations     a. holter (7/11):  isolated PVCs and PACs  . Breast cancer     a. DCIS s/p R lumpectomy  . Anxiety   . Arthritis     celebrex use off & on for arthritis- hands, back   . Valvular heart disease     a. Echo (7/11):  EF 55-60%, Gr 1 DD, mild-mod AI, mild-mod MR, mod PI, PASP 30;  b. Echo (09/2010):  Mild LVH, EF 60%, Gr 1 DD, mild to mod AI, mild MR, mild TR, mild to mod PI, PASP 36.;   c.  Echo (05/2013):  EF 60-65%, no RWMA, mild AI, mild to mod PI   . Bifascicular block   . Hyperlipidemia     Assessment: 30 YOF who presents with back pain, also having some confusion. She is tachycardic, slightly hypotensive and febrile.  Vancomycin 1000mg  IV x1 and Zosyn 3.375g IV x1 were ordered by EDP. Blood cultures have been sent. Scr 0.96 with est CrCl ~75mL/min  Goal of  Therapy:  Vancomycin trough level 15-20 mcg/ml  Plan:  1. Vancomycin 750mg  IV q24h starting 5/2 as dose was given in ED 2. Zosyn 3.375g IV q8h EI 3. Follow c/s, clinical progression, renal function, trough at [place] D. Wilkins Elpers, PharmD, BCPS Clinical Pharmacist Pager: [phone] 07/09/2013 9:04 AM

## 2013-07-09 NOTE — ED Notes (Signed)
Delo, MD informed of temperature

## 2013-07-10 LAB — COMPREHENSIVE METABOLIC PANEL
ALT: 10 U/L (ref 0–35)
AST: 27 U/L (ref 0–37)
Albumin: 2.3 g/dL — ABNORMAL LOW (ref 3.5–5.2)
Alkaline Phosphatase: 84 U/L (ref 39–117)
BUN: 15 mg/dL (ref 6–23)
CALCIUM: 8.8 mg/dL (ref 8.4–10.5)
CO2: 21 mEq/L (ref 19–32)
Chloride: 105 mEq/L (ref 96–112)
Creatinine, Ser: 0.73 mg/dL (ref 0.50–1.10)
GFR calc Af Amer: 89 mL/min — ABNORMAL LOW (ref >90)
GFR calc non Af Amer: 77 mL/min — ABNORMAL LOW (ref >90)
GLUCOSE: 100 mg/dL — AB (ref 70–99)
Potassium: 4.8 mEq/L (ref 3.7–5.3)
SODIUM: 142 meq/L (ref 137–147)
Total Bilirubin: 0.4 mg/dL (ref 0.3–1.2)
Total Protein: 6.5 g/dL (ref 6.0–8.3)

## 2013-07-10 LAB — HIV ANTIBODY (ROUTINE TESTING W REFLEX): HIV 1&2 Ab, 4th Generation: NONREACTIVE

## 2013-07-10 LAB — CBC
HCT: 30.9 % — ABNORMAL LOW (ref 36.0–46.0)
HEMOGLOBIN: 10.4 g/dL — AB (ref 12.0–15.0)
MCH: 23.2 pg — ABNORMAL LOW (ref 26.0–34.0)
MCHC: 33.7 g/dL (ref 30.0–36.0)
MCV: 69 fL — ABNORMAL LOW (ref 78.0–100.0)
Platelets: 121 10*3/uL — ABNORMAL LOW (ref 150–400)
RBC: 4.48 MIL/uL (ref 3.87–5.11)
RDW: 14.1 % (ref 11.5–15.5)
WBC: 28.9 10*3/uL — ABNORMAL HIGH (ref 4.0–10.5)

## 2013-07-10 LAB — GLUCOSE, CAPILLARY: GLUCOSE-CAPILLARY: 103 mg/dL — AB (ref 70–99)

## 2013-07-10 LAB — STREP PNEUMONIAE URINARY ANTIGEN: STREP PNEUMO URINARY ANTIGEN: NEGATIVE

## 2013-07-10 LAB — URINE CULTURE
Colony Count: NO GROWTH
Culture: NO GROWTH

## 2013-07-10 LAB — MAGNESIUM: Magnesium: 1.7 mg/dL (ref 1.5–2.5)

## 2013-07-10 MED ORDER — DEXTROSE 5 % IV SOLN
2.0000 g | INTRAVENOUS | Status: DC
  Administered 2013-07-10 – 2013-07-12 (×3): 2 g via INTRAVENOUS
  Filled 2013-07-10 (×3): qty 2

## 2013-07-10 NOTE — Evaluation (Signed)
Clinical/Bedside Swallow Evaluation Patient Details  Name: Brianna Hunter MRN: [id number] Date of Birth: 1928/04/05  Today's Date: 07/10/2013 Time: 1315-1340 SLP Time Calculation (min): 25 min  Past Medical History:  Past Medical History  Diagnosis Date  . Rheumatism   . Back pain   . Hypertension   . Palpitations     a. holter (7/11):  isolated PVCs and PACs  . Breast cancer     a. DCIS s/p R lumpectomy  . Anxiety   . Arthritis     celebrex use off & on for arthritis- hands, back   . Valvular heart disease     a. Echo (7/11):  EF 55-60%, Gr 1 DD, mild-mod AI, mild-mod MR, mod PI, PASP 30;  b. Echo (09/2010):  Mild LVH, EF 60%, Gr 1 DD, mild to mod AI, mild MR, mild TR, mild to mod PI, PASP 36.;   c.  Echo (05/2013):  EF 60-65%, no RWMA, mild AI, mild to mod PI   . Bifascicular block   . Hyperlipidemia    Past Surgical History:  Past Surgical History  Procedure Laterality Date  . Knee surgery      Left  . Shoulder surgery      Left  . Joint replacement      2008- L knee  . Appendectomy    . Abdominal hysterectomy      partial   . Eye surgery      left eye 2013  . Ear cyst excision  11/07/2011    Procedure: CYST REMOVAL;  Surgeon: Madilyn Hook, DO;  Location: [facility];  Service: General;  Laterality: Right;  Removal Cyst Right Upper Chest   HPI:  80 year old female with past medical history of hypertension, breast cancer, valvular heart disease, bifascicular block that comes in for back pain she was brought here by EMS and was complaining of back pain. She arrived to the emergency room she was tachycardic. She has been complaining of cough and shortness of breath the patient is very tangential in her history and cannot give a straight history. She relates and fever this started the day prior to admission and hemoptysis about a teaspoon with every cough the patient is asked to return she coughs she feels better.   Assessment / Plan / Recommendation Clinical Impression  Pt did  not demonstrate any overt s/s of aspiration at bedside. Swallow was timely. Pt was taking large bites and sips and still did not get choked. The pt described that on rare occasion she gets choked on thin liquids and then coughs them back up. Educated pt on importance of taking small bites and sips, along with not eating and drinking a lot right before bed. Given this, pt's risk of aspiration is none-mild. Recommend continuing regular diet, thin liquids, meds whole with liquids or with puree if getting choked. Speech will sign off at this time. Please re-consult if needed.    Aspiration Risk  Mild    Diet Recommendation Regular;Thin liquid   Liquid Administration via: Cup;Straw Medication Administration: Whole meds with liquid Supervision: Patient able to self feed;Intermittent supervision to cue for compensatory strategies Compensations: Slow rate;Small sips/bites Postural Changes and/or Swallow Maneuvers: Seated upright 90 degrees;Upright 30-60 min after meal    Other  Recommendations Oral Care Recommendations: Oral care BID Other Recommendations: Have oral suction available   Follow Up Recommendations  None    Frequency and Duration        Pertinent Vitals/Pain n/a    SLP Swallow  Goals     Swallow Study Prior Functional Status       General HPI: 80 year old female with past medical history of hypertension, breast cancer, valvular heart disease, bifascicular block that comes in for back pain she was brought here by EMS and was complaining of back pain. She arrived to the emergency room she was tachycardic. She has been complaining of cough and shortness of breath the patient is very tangential in her history and cannot give a straight history. She relates and fever this started the day prior to admission and hemoptysis about a teaspoon with every cough the patient is asked to return she coughs she feels better. Type of Study: Bedside swallow evaluation Diet Prior to this Study:  Regular;Thin liquids Temperature Spikes Noted: Yes Respiratory Status: Nasal cannula History of Recent Intubation: No Behavior/Cognition: Alert;Cooperative;Pleasant mood Oral Cavity - Dentition: Edentulous Self-Feeding Abilities: Able to feed self Patient Positioning: Upright in bed Baseline Vocal Quality: Clear Volitional Cough: Strong Volitional Swallow: Able to elicit    Oral/Motor/Sensory Function Overall Oral Motor/Sensory Function: Appears within functional limits for tasks assessed Labial ROM: Within Functional Limits Labial Symmetry: Within Functional Limits Labial Strength: Within Functional Limits Lingual ROM: Within Functional Limits Lingual Symmetry: Within Functional Limits Lingual Strength: Within Functional Limits   Ice Chips Ice chips: Not tested   Thin Liquid Thin Liquid: Within functional limits Presentation: Cup    Nectar Thick Nectar Thick Liquid: Not tested   Honey Thick Honey Thick Liquid: Not tested   Puree Puree: Within functional limits Presentation: Self Fed   Solid   GO    Solid: Within functional limits Presentation: Jessie , MA, CCC-SLP 07/10/2013,1:45 PM

## 2013-07-10 NOTE — Consult Note (Signed)
PHARMACY CONSULT NOTE - INITIAL  Pharmacy Consult for :   Vancomycin, Zosyn, Cefepime Indication:  HCAP  Hospital Problems: Principal Problem:   HCAP   Leukocytosis Active Problems:   HYPERTENSION, BENIGN   Sepsis   Cavitary lesion of lung   Protein-calorie malnutrition, moderate   Hemoptysis   Allergies: Allergies  Allergen Reactions  . Milk-Related Compounds Other (See Comments)    Frequent bowel movements     Patient Measurements: Height: 4\' 9"  (144.8 cm) Weight: 126 lb 8.7 oz (57.4 kg) IBW/kg (Calculated) : 38.6  Vital Signs: BP 119/56  Pulse 80  Temp(Src) 97.7 F (36.5 C) (Axillary)  Resp 28  Ht 4\' 9"  (1.448 m)  Wt 126 lb 8.7 oz (57.4 kg)  BMI 27.38 kg/m2  SpO2 100%  Labs:  Recent Labs  07/09/13 0735  WBC 26.4*  HGB 11.2*  PLT PLATELET CLUMPS NOTED ON SMEAR, COUNT APPEARS DECREASED  CREATININE 0.96   Estimated Creatinine Clearance: 32.3 ml/min (by C-G formula based on Cr of 0.96).   Microbiology: Recent Results (from the past 720 hour(s))  MRSA PCR SCREENING     Status: None   Collection Time    07/09/13  7:00 PM      Result Value Ref Range Status   MRSA by PCR NEGATIVE  NEGATIVE Final   Comment:            The GeneXpert MRSA Assay (FDA     approved for NASAL specimens     only), is one component of a     comprehensive MRSA colonization     surveillance program. It is not     intended to diagnose MRSA     infection nor to guide or     monitor treatment for     MRSA infections.    Medical/Surgical History: Past Medical History  Diagnosis Date  . Rheumatism   . Back pain   . Hypertension   . Palpitations     a. holter (7/11):  isolated PVCs and PACs  . Breast cancer     a. DCIS s/p R lumpectomy  . Anxiety   . Arthritis     celebrex use off & on for arthritis- hands, back   . Valvular heart disease     a. Echo (7/11):  EF 55-60%, Gr 1 DD, mild-mod AI, mild-mod MR, mod PI, PASP 30;  b. Echo (09/2010):  Mild LVH, EF 60%, Gr 1 DD,  mild to mod AI, mild MR, mild TR, mild to mod PI, PASP 36.;   c.  Echo (05/2013):  EF 60-65%, no RWMA, mild AI, mild to mod PI   . Bifascicular block   . Hyperlipidemia    Past Surgical History  Procedure Laterality Date  . Knee surgery      Left  . Shoulder surgery      Left  . Joint replacement      2008- L knee  . Appendectomy    . Abdominal hysterectomy      partial   . Eye surgery      left eye 2013  . Ear cyst excision  11/07/2011    Procedure: CYST REMOVAL;  Surgeon: Madilyn Hook, DO;  Location: [facility];  Service: General;  Laterality: Right;  Removal Cyst Right Upper Chest    Current Medication[s] Include:  Scheduled:  Scheduled:  . albuterol  2.5 mg Nebulization BID  . aspirin EC  81 mg Oral Daily  . latanoprost  1 drop Right Eye QHS  .  pneumococcal 23 valent vaccine  0.5 mL Intramuscular Tomorrow-1000  . sodium chloride  3 mL Intravenous Q12H  . sodium chloride  3 mL Intravenous Q12H  . tamoxifen  20 mg Oral Daily  . vancomycin  750 mg Intravenous Q24H    Infusion[s]: Infusions:    Antibiotic[s]: Anti-infectives   Start     Dose/Rate Route Frequency Ordered Stop   07/10/13 0900  vancomycin (VANCOCIN) IVPB 750 mg/150 ml premix  Status:  Discontinued     750 mg 150 mL/hr over 60 Minutes Intravenous Every 24 hours 07/09/13 0905 07/09/13 1856   07/10/13 0900  vancomycin (VANCOCIN) IVPB 750 mg/150 ml premix     750 mg 150 mL/hr over 60 Minutes Intravenous Every 24 hours 07/09/13 1901     07/09/13 1930  cefTRIAXone (ROCEPHIN) 1 g in dextrose 5 % 50 mL IVPB  Status:  Discontinued     1 g 100 mL/hr over 30 Minutes Intravenous Every 24 hours 07/09/13 1856 07/10/13 0740   07/09/13 1930  azithromycin (ZITHROMAX) 500 mg in dextrose 5 % 250 mL IVPB  Status:  Discontinued     500 mg 250 mL/hr over 60 Minutes Intravenous Every 24 hours 07/09/13 1855 07/10/13 0740   07/09/13 1400  piperacillin-tazobactam (ZOSYN) IVPB 3.375 g  Status:  Discontinued     3.375 g 12.5 mL/hr  over 240 Minutes Intravenous 3 times per day 07/09/13 0905 07/09/13 1856   07/09/13 0800  piperacillin-tazobactam (ZOSYN) IVPB 3.375 g     3.375 g 100 mL/hr over 30 Minutes Intravenous  Once 07/09/13 0749 07/09/13 0920   07/09/13 0800  vancomycin (VANCOCIN) IVPB 1000 mg/200 mL premix     1,000 mg 200 mL/hr over 60 Minutes Intravenous  Once 07/09/13 0749 07/09/13 1006     Assessment:  Day # 2 Vancomycin and Zosyn for HCAP in 80 y/o Female with history of Breast Cancer, HCAP  Renal function with estimated CrCl ~ 32 ml/min  Afebrile, last WBC 26.4, LA 2.2  Cefepime to be added for broadening coverage  Goal of Therapy:   Vancomycin trough level 15-20 mcg/ml Cefepime and Zosyn selected for HCAP infection and adjusted for renal function.   Plan:  1. Continue Vancomycin and Zosyn as previously ordered. 2. Begin Cefepime 2 gm IV q 24 hours. 3. Follow up SCr, UOP, cultures, Levels, clinical course and adjust as clinically indicated.  Gianella Chismar, Craig Guess,  Pharm.D,    5/2/20158:13 AM

## 2013-07-10 NOTE — Progress Notes (Addendum)
[facility] PROGRESS NOTE  Brianna Hunter XTK:[id number] DOB: 18-Apr-1928 DOA: 07/09/2013 PCP: [name], MD   Brief narrative 80 year old female with history of hypertension, breast cancer, anxiety, arthritis, history of valvular heart disease who was brought in by EMS for low back pain and fatigue. However she was tachycardic and complaining of cough and shortness of breath for an unclear duration. She also reports having hemoptysis for past few days. On admission she had significant leukocytosis, fever and tachycardia. Chest x-ray done showed cavitary mass over the right upper lobe which was followed up with a CT scan of the chest with contrast showing right upper lobe consolidation with central cavitation and patchy areas of infiltrate bilaterally with small right pleural effusion suggestive of necrotizing pneumonia. Admitted to step down for sepsis.  Assessment/Plan: Sepsis Likely secondary to underlying necrotizing pneumonia. She has patchy bilateral pneumonia with right upper lung consolidation and central cavitation. -Patient started on empiric rocephin and azithromycin. Given underlying sepsis with pneumonia,  I will broaden antibiotic to IV vancomycin and cefepime. She has worsened leukocytosis today. follow blood culture, sputum culture, urine for strep antigen and Legionella. HIV antibody negative. Sent sputum for cytology.  Acute hypoxic respiratory failure Secondary to underlying PNA. Patient maintaining sats on 2 L via nasal cannula. Continue nebs.  Hemoptysis Possibly related to underlying necrotizing pneumonia. Monitor with current antibiotic regimen. Follow sputum cultures. Ordered sputum for cytology as well. No history of TB. H&H stable.  History of breast cancer Continue tamoxifen  Hypokalemia Replenished  Thrombocytopenia Monitor platelets closely  Protein calorie malnutrition Nutrition consult  Code Status: Full code Family Communication: None at  bedside  Disposition Plan: Continue monitoring in step-down.   Consultants:  None  Procedures:  None  Antibiotics:  IV Rocephin and azithromycin (5/1-5/2)  IV vancomycin and cefepime (5/2--)  HPI/Subjective: Patient seen and examined this morning. Reports still having cough with scanty blood  Objective: Filed Vitals:   07/10/13 1200  BP: 123/60  Pulse: 86  Temp: 97.7 F (36.5 C)  Resp: 27    Intake/Output Summary (Last 24 hours) at 07/10/13 1420 Last data filed at 07/10/13 1200  Gross per 24 hour  Intake      0 ml  Output   1900 ml  Net  -1900 ml   Filed Weights   07/09/13 0723 07/09/13 1900  Weight: 53.071 kg (117 lb) 57.4 kg (126 lb 8.7 oz)    Exam:   General: Elderly thin built female in no acute distress  HEENT: No pallor, moist oral mucosa , no cervical lymphadenopathy  Cardiovascular: Normal S1 and S2, no murmurs rub or gallop  Respiratory: Crackles over her left lung, rhonchi or wheeze  Abdomen: Soft, nontender, nondistended, bowel sounds present  Musculoskeletal: Warm, no edema  CNS: AAO x3  Data Reviewed: Basic Metabolic Panel:  Recent Labs Lab 07/09/13 0735 07/10/13 0850  NA 132* 142  K 3.4* 4.8  CL 94* 105  CO2 22 21  GLUCOSE 243* 100*  BUN 26* 15  CREATININE 0.96 0.73  CALCIUM 9.2 8.8  MG  --  1.7   Liver Function Tests:  Recent Labs Lab 07/09/13 0735 07/10/13 0850  AST 18 27  ALT 11 10  ALKPHOS 75 84  BILITOT 0.3 0.4  PROT 7.1 6.5  ALBUMIN 2.9* 2.3*   No results found for this basename: LIPASE, AMYLASE,  in the last 168 hours No results found for this basename: AMMONIA,  in the last 168 hours CBC:  Recent Labs Lab  07/09/13 0735 07/10/13 0850  WBC 26.4* 28.9*  NEUTROABS 24.6*  --   HGB 11.2* 10.4*  HCT 33.1* 30.9*  MCV 68.4* 69.0*  PLT PLATELET CLUMPS NOTED ON SMEAR, COUNT APPEARS DECREASED 121*   Cardiac Enzymes: No results found for this basename: CKTOTAL, CKMB, CKMBINDEX, TROPONINI,  in the last  168 hours BNP (last 3 results)  Recent Labs  07/09/13 0741  PROBNP 596.8*   CBG:  Recent Labs Lab 07/10/13 0840  GLUCAP 103*    Recent Results (from the past 240 hour(s))  CULTURE, BLOOD (ROUTINE X 2)     Status: None   Collection Time    07/09/13  7:49 AM      Result Value Ref Range Status   Specimen Description BLOOD RIGHT ANTECUBITAL   Final   Special Requests BOTTLES DRAWN AEROBIC AND ANAEROBIC 10MLS   Final   Culture  Setup Time     Final   Value: 07/09/2013 12:52     Performed at [facility]   Culture     Final   Value:        BLOOD CULTURE RECEIVED NO GROWTH TO DATE CULTURE WILL BE HELD FOR 5 DAYS BEFORE ISSUING A FINAL NEGATIVE REPORT     Performed at [facility]   Report Status PENDING   Incomplete  CULTURE, BLOOD (ROUTINE X 2)     Status: None   Collection Time    07/09/13  8:02 AM      Result Value Ref Range Status   Specimen Description BLOOD LEFT ANTECUBITAL   Final   Special Requests BOTTLES DRAWN AEROBIC AND ANAEROBIC 10CC   Final   Culture  Setup Time     Final   Value: 07/09/2013 12:52     Performed at [facility]   Culture     Final   Value:        BLOOD CULTURE RECEIVED NO GROWTH TO DATE CULTURE WILL BE HELD FOR 5 DAYS BEFORE ISSUING A FINAL NEGATIVE REPORT     Performed at [facility]   Report Status PENDING   Incomplete  URINE CULTURE     Status: None   Collection Time    07/09/13  9:15 AM      Result Value Ref Range Status   Specimen Description URINE, CATHETERIZED   Final   Special Requests NONE   Final   Culture  Setup Time     Final   Value: 07/09/2013 09:51     Performed at [facility]     Final   Value: NO GROWTH     Performed at [facility]   Culture     Final   Value: NO GROWTH     Performed at [facility]   Report Status 07/10/2013 FINAL   Final  MRSA PCR SCREENING     Status: None   Collection Time    07/09/13  7:00 PM      Result Value Ref Range  Status   MRSA by PCR NEGATIVE  NEGATIVE Final   Comment:            The GeneXpert MRSA Assay (FDA     approved for NASAL specimens     only), is one component of a     comprehensive MRSA colonization     surveillance program. It is not     intended to diagnose MRSA     infection nor to  guide or     monitor treatment for     MRSA infections.     Studies: Ct Chest W Contrast  07/09/2013   CLINICAL DATA:  Cough. Abnormal chest x-ray with cavitary lung lesion.  EXAM: CT CHEST WITH CONTRAST  TECHNIQUE: Multidetector CT imaging of the chest was performed during intravenous contrast administration.  CONTRAST:  50mL OMNIPAQUE IOHEXOL 300 MG/ML  SOLN  COMPARISON:  DG CHEST 1V PORT dated 07/09/2013; CT ANGIO CHEST W/CM &/OR WO/CM dated 06/02/2012  FINDINGS: As seen on previous checks x-ray, there ED is wedge-shaped consolidation in the right upper lung with central cavitation. This is new since the prior study. This likely represents consolidation due to necrotizing pneumonia. Underlying obstructing mass lesion is not excluded and followup after resolution of acute process is recommended. There is a small right pleural effusion with patchy areas of infiltration in the right lung base. Patchy areas of infiltration also demonstrated in the posterior left upper lung. Mild prominence of right hilar and mediastinal lymph nodes, likely to be reactive. Esophagus is mostly decompressed. Calcification of the thoracic aorta. Normal caliber without dissection. Heart size is normal. No pneumothorax. Scattered calcified granulomas in the lungs. Visualized portions of the upper abdominal organs are grossly unremarkable. Degenerative changes throughout the thoracic spine. No destructive bone lesions appreciated.  IMPRESSION: Posterior right upper lung consolidation with central cavitation, patchy areas of infiltrate bilaterally, and small right pleural effusion. Changes likely to represent necrotizing pneumonia. Obstructing mass  lesion is not excluded and followup after resolution of acute process is recommended.   Electronically Signed   By: Lucienne Capers M.D.   On: 07/09/2013 21:55   Dg Chest Port 1 View  07/09/2013   CLINICAL DATA:  Chest and back pain, shortness of breath, history hypertension, breast cancer, hyperlipidemia, heart murmur  EXAM: PORTABLE CHEST - 1 VIEW  COMPARISON:  Portable exam 0742 hr compared to radiographs and CT of 06/02/2012  FINDINGS: Enlargement of cardiac silhouette.  Atherosclerotic calcification aorta.  Mediastinal contours and pulmonary vascularity normal.  Cavitary the opacity/mass RIGHT upper lobe 5.3 x 5.0 cm in size.  This could represent a cavitary neoplasm or a lung abscess.  Remaining lungs clear.  Mild elevation of RIGHT diaphragm.  No pleural effusion or pneumothorax.  BILATERAL chronic rotator cuff tears with degenerative changes RIGHT glenohumeral joint.  Osseous demineralization.  IMPRESSION: Cavitary mass RIGHT upper lobe 5.3 x 5.0 cm in size question cavitary neoplasm or lung abscess.  Further evaluation by CT chest with IV contrast recommended.   Electronically Signed   By: Lavonia Dana M.D.   On: 07/09/2013 07:56    Scheduled Meds: . albuterol  2.5 mg Nebulization BID  . aspirin EC  81 mg Oral Daily  . ceFEPIme (MAXIPIME) 2 GM IVP  2 g Intravenous Q24H  . latanoprost  1 drop Right Eye QHS  . pneumococcal 23 valent vaccine  0.5 mL Intramuscular Tomorrow-1000  . sodium chloride  3 mL Intravenous Q12H  . sodium chloride  3 mL Intravenous Q12H  . tamoxifen  20 mg Oral Daily  . vancomycin  750 mg Intravenous Q24H   Continuous Infusions:     Time spent: 35 minutes    [place]  [facility] Pager [phone]. If 7PM-7AM, please contact night-coverage at [email or web address], password [facility] 07/10/2013, 2:20 PM  LOS: 1 day

## 2013-07-11 DIAGNOSIS — J189 Pneumonia, unspecified organism: Secondary | ICD-10-CM

## 2013-07-11 LAB — CBC
HCT: 29.1 % — ABNORMAL LOW (ref 36.0–46.0)
HEMOGLOBIN: 9.5 g/dL — AB (ref 12.0–15.0)
MCH: 22.6 pg — AB (ref 26.0–34.0)
MCHC: 32.6 g/dL (ref 30.0–36.0)
MCV: 69.1 fL — ABNORMAL LOW (ref 78.0–100.0)
Platelets: 129 10*3/uL — ABNORMAL LOW (ref 150–400)
RBC: 4.21 MIL/uL (ref 3.87–5.11)
RDW: 14 % (ref 11.5–15.5)
WBC: 24.3 10*3/uL — ABNORMAL HIGH (ref 4.0–10.5)

## 2013-07-11 LAB — GLUCOSE, CAPILLARY: GLUCOSE-CAPILLARY: 98 mg/dL (ref 70–99)

## 2013-07-11 NOTE — Progress Notes (Signed)
[facility] PROGRESS NOTE  Brianna Hunter TKW:[id number] DOB: Oct 29, 1928 DOA: 07/09/2013 PCP: [name], MD   Brief narrative 80 year old female with history of hypertension, breast cancer, anxiety, arthritis, history of valvular heart disease who was brought in by EMS for low back pain and fatigue. However she was tachycardic and complaining of cough and shortness of breath for an unclear duration. She also reports having hemoptysis for past few days. On admission she had significant leukocytosis, fever and tachycardia. Chest x-ray done showed cavitary mass over the right upper lobe which was followed up with a CT scan of the chest with contrast showing right upper lobe consolidation with central cavitation and patchy areas of infiltrate bilaterally with small right pleural effusion suggestive of necrotizing pneumonia. Admitted to step down for sepsis.  Assessment/Plan: Sepsis Likely secondary to underlying necrotizing pneumonia. She has patchy bilateral pneumonia with right upper lung consolidation and central cavitation. -Patient started on empiric rocephin and azithromycin on admission.  broadened antibiotic to IV vancomycin and cefepime. She has worsened leukocytosis today. follow blood culture so far negative. , sputum culture, urine for strep antigen negative . pending Legionella ag. HIV antibody negative. Sent sputum for cytology.  Acute hypoxic respiratory failure Secondary to underlying PNA. Patient maintaining sats on 2 L via nasal cannula. Continue nebs.  Hemoptysis Possibly related to underlying necrotizing pneumonia. Now improved. Monitor with current antibiotic regimen. Follow sputum cultures. Ordered sputum for cytology as well. No history of TB. H&H stable.  History of breast cancer Continue tamoxifen  Hypokalemia Replenished  Thrombocytopenia Monitor platelets closely  Protein calorie malnutrition Nutrition consult  generalized weakness Get PT  eval    Code Status: Full code Family Communication: None at bedside . Could not reach daughter over the phone Disposition Plan: Continue monitoring in step-down.   Consultants:  None  Procedures:  None  Antibiotics:  IV Rocephin and azithromycin (5/1-5/2)  IV vancomycin and cefepime (5/2--)  HPI/Subjective: Patient seen and examined this morning. Reports still having cough but no hemoptysis  Objective: Filed Vitals:   07/11/13 1200  BP: 148/68  Pulse: 98  Temp: 100.1 F (37.8 C)  Resp: 26    Intake/Output Summary (Last 24 hours) at 07/11/13 1341 Last data filed at 07/11/13 1113  Gross per 24 hour  Intake      0 ml  Output   1100 ml  Net  -1100 ml   Filed Weights   07/09/13 0723 07/09/13 1900 07/11/13 0407  Weight: 53.071 kg (117 lb) 57.4 kg (126 lb 8.7 oz) 58.1 kg (128 lb 1.4 oz)    Exam:   General: Elderly thin built female in no acute distress  HEENT: No pallor, moist oral mucosa , no cervical lymphadenopathy  Cardiovascular: Normal S1 and S2, no murmurs rub or gallop  Respiratory: Crackles over her left lung, rhonchi or wheeze  Abdomen: Soft, nontender, nondistended, bowel sounds present  Musculoskeletal: Warm, no edema  CNS: AAO x3  Data Reviewed: Basic Metabolic Panel:  Recent Labs Lab 07/09/13 0735 07/10/13 0850  NA 132* 142  K 3.4* 4.8  CL 94* 105  CO2 22 21  GLUCOSE 243* 100*  BUN 26* 15  CREATININE 0.96 0.73  CALCIUM 9.2 8.8  MG  --  1.7   Liver Function Tests:  Recent Labs Lab 07/09/13 0735 07/10/13 0850  AST 18 27  ALT 11 10  ALKPHOS 75 84  BILITOT 0.3 0.4  PROT 7.1 6.5  ALBUMIN 2.9* 2.3*   No results found for this  basename: LIPASE, AMYLASE,  in the last 168 hours No results found for this basename: AMMONIA,  in the last 168 hours CBC:  Recent Labs Lab 07/09/13 0735 07/10/13 0850 07/11/13 0413  WBC 26.4* 28.9* 24.3*  NEUTROABS 24.6*  --   --   HGB 11.2* 10.4* 9.5*  HCT 33.1* 30.9* 29.1*  MCV 68.4*  69.0* 69.1*  PLT PLATELET CLUMPS NOTED ON SMEAR, COUNT APPEARS DECREASED 121* 129*   Cardiac Enzymes: No results found for this basename: CKTOTAL, CKMB, CKMBINDEX, TROPONINI,  in the last 168 hours BNP (last 3 results)  Recent Labs  07/09/13 0741  PROBNP 596.8*   CBG:  Recent Labs Lab 07/10/13 0840 07/11/13 0837  GLUCAP 103* 98    Recent Results (from the past 240 hour(s))  CULTURE, BLOOD (ROUTINE X 2)     Status: None   Collection Time    07/09/13  7:49 AM      Result Value Ref Range Status   Specimen Description BLOOD RIGHT ANTECUBITAL   Final   Special Requests BOTTLES DRAWN AEROBIC AND ANAEROBIC 10MLS   Final   Culture  Setup Time     Final   Value: 07/09/2013 12:52     Performed at [facility]   Culture     Final   Value:        BLOOD CULTURE RECEIVED NO GROWTH TO DATE CULTURE WILL BE HELD FOR 5 DAYS BEFORE ISSUING A FINAL NEGATIVE REPORT     Performed at [facility]   Report Status PENDING   Incomplete  CULTURE, BLOOD (ROUTINE X 2)     Status: None   Collection Time    07/09/13  8:02 AM      Result Value Ref Range Status   Specimen Description BLOOD LEFT ANTECUBITAL   Final   Special Requests BOTTLES DRAWN AEROBIC AND ANAEROBIC 10CC   Final   Culture  Setup Time     Final   Value: 07/09/2013 12:52     Performed at [facility]   Culture     Final   Value:        BLOOD CULTURE RECEIVED NO GROWTH TO DATE CULTURE WILL BE HELD FOR 5 DAYS BEFORE ISSUING A FINAL NEGATIVE REPORT     Performed at [facility]   Report Status PENDING   Incomplete  URINE CULTURE     Status: None   Collection Time    07/09/13  9:15 AM      Result Value Ref Range Status   Specimen Description URINE, CATHETERIZED   Final   Special Requests NONE   Final   Culture  Setup Time     Final   Value: 07/09/2013 09:51     Performed at [facility]     Final   Value: NO GROWTH     Performed at [facility]   Culture     Final    Value: NO GROWTH     Performed at [facility]   Report Status 07/10/2013 FINAL   Final  MRSA PCR SCREENING     Status: None   Collection Time    07/09/13  7:00 PM      Result Value Ref Range Status   MRSA by PCR NEGATIVE  NEGATIVE Final   Comment:            The GeneXpert MRSA Assay (FDA     approved for NASAL specimens  only), is one component of a     comprehensive MRSA colonization     surveillance program. It is not     intended to diagnose MRSA     infection nor to guide or     monitor treatment for     MRSA infections.     Studies: Ct Chest W Contrast  07/09/2013   CLINICAL DATA:  Cough. Abnormal chest x-ray with cavitary lung lesion.  EXAM: CT CHEST WITH CONTRAST  TECHNIQUE: Multidetector CT imaging of the chest was performed during intravenous contrast administration.  CONTRAST:  1mL OMNIPAQUE IOHEXOL 300 MG/ML  SOLN  COMPARISON:  DG CHEST 1V PORT dated 07/09/2013; CT ANGIO CHEST W/CM &/OR WO/CM dated 06/02/2012  FINDINGS: As seen on previous checks x-ray, there ED is wedge-shaped consolidation in the right upper lung with central cavitation. This is new since the prior study. This likely represents consolidation due to necrotizing pneumonia. Underlying obstructing mass lesion is not excluded and followup after resolution of acute process is recommended. There is a small right pleural effusion with patchy areas of infiltration in the right lung base. Patchy areas of infiltration also demonstrated in the posterior left upper lung. Mild prominence of right hilar and mediastinal lymph nodes, likely to be reactive. Esophagus is mostly decompressed. Calcification of the thoracic aorta. Normal caliber without dissection. Heart size is normal. No pneumothorax. Scattered calcified granulomas in the lungs. Visualized portions of the upper abdominal organs are grossly unremarkable. Degenerative changes throughout the thoracic spine. No destructive bone lesions appreciated.   IMPRESSION: Posterior right upper lung consolidation with central cavitation, patchy areas of infiltrate bilaterally, and small right pleural effusion. Changes likely to represent necrotizing pneumonia. Obstructing mass lesion is not excluded and followup after resolution of acute process is recommended.   Electronically Signed   By: Lucienne Capers M.D.   On: 07/09/2013 21:55    Scheduled Meds: . albuterol  2.5 mg Nebulization BID  . aspirin EC  81 mg Oral Daily  . ceFEPIme (MAXIPIME) 2 GM IVP  2 g Intravenous Q24H  . latanoprost  1 drop Right Eye QHS  . pneumococcal 23 valent vaccine  0.5 mL Intramuscular Tomorrow-1000  . sodium chloride  3 mL Intravenous Q12H  . sodium chloride  3 mL Intravenous Q12H  . tamoxifen  20 mg Oral Daily  . vancomycin  750 mg Intravenous Q24H   Continuous Infusions:     Time spent: 35 minutes    Demontay Grantham  [facility] Pager [phone]. If 7PM-7AM, please contact night-coverage at [email or web address], password [facility] 07/11/2013, 1:41 PM  LOS: 2 days

## 2013-07-11 NOTE — Evaluation (Signed)
Physical Therapy Evaluation Patient Details Name: Brianna Hunter MRN: [id number] DOB: 11/24/1928 Today's Date: 07/11/2013   History of Present Illness  80 year old female with history of hypertension, breast cancer, anxiety, arthritis, history of valvular heart disease who was brought in by EMS for low back pain and fatigue. However she was tachycardic and complaining of cough and shortness of breath for an unclear duration. She also reports having hemoptysis for past few days. On admission she had significant leukocytosis, fever and tachycardia. Chest x-ray done showed cavitary mass over the right upper lobe which was followed up with a CT scan of the chest with contrast showing right upper lobe consolidation with central cavitation and patchy areas of infiltrate bilaterally with small right pleural effusion suggestive of necrotizing pneumonia. Admitted to step down for sepsis. PT ordered 5/2  Clinical Impression   Pt admitted with above. Pt currently with functional limitations due to the deficits listed below (see PT Problem List).  Pt will benefit from skilled PT to increase their independence and safety with mobility to allow discharge to the venue listed below.   At this point, I favor postacute rehab at SNF to maximize independence and safety with mobility prior to dc home, as pt lives alone.      Follow Up Recommendations SNF    Equipment Recommendations  3in1 (PT)    Recommendations for Other Services OT consult     Precautions / Restrictions Precautions Precautions: Fall      Mobility  Bed Mobility Overal bed mobility: Needs Assistance Bed Mobility: Supine to Sit     Supine to sit: Min assist     General bed mobility comments: Cues for technique; mostly requiring min assist for lines  Transfers Overall transfer level: Needs assistance Equipment used: Rolling walker (2 wheeled) Transfers: Sit to/from Stand Sit to Stand: Min assist         General transfer  comment: Cues for safety and hand placement; Cues also to control descent  Ambulation/Gait Ambulation/Gait assistance: Min guard;Min assist Ambulation Distance (Feet): 50 Feet Assistive device: Rolling walker (2 wheeled) Gait Pattern/deviations: Step-through pattern     General Gait Details: Mostly steadying assist, with one loss of balance requiring min assist to regain balance  Stairs            Wheelchair Mobility    Modified Rankin (Stroke Patients Only)       Balance Overall balance assessment: Needs assistance         Standing balance support: Bilateral upper extremity supported Standing balance-Leahy Scale: Poor Standing balance comment: Dependent on UE suport for steadiness with amb                             Pertinent Vitals/Pain Session conducted on Room Air, and O2 sats stayed greater than or equal to 93%    Home Living Family/patient expects to be discharged to:: Private residence Living Arrangements: Alone (Reports her daughter can stay with her the first few days) Available Help at Discharge: Family;Friend(s);Other (Comment) (can check in on her) Type of Home: Apartment Home Access: Level entry;Elevator     Home Layout: One level Home Equipment: Walker - 2 wheels      Prior Function Level of Independence: Independent               Hand Dominance        Extremity/Trunk Assessment   Upper Extremity Assessment: Defer to OT evaluation;Generalized weakness  Lower Extremity Assessment: Generalized weakness         Communication   Communication: No difficulties  Cognition Arousal/Alertness: Awake/alert Behavior During Therapy: WFL for tasks assessed/performed Overall Cognitive Status: Within Functional Limits for tasks assessed                      General Comments      Exercises        Assessment/Plan    PT Assessment Patient needs continued PT services  PT Diagnosis Difficulty  walking;Generalized weakness   PT Problem List Decreased strength;Decreased activity tolerance;Decreased balance;Decreased mobility;Decreased knowledge of use of DME;Cardiopulmonary status limiting activity;Decreased knowledge of precautions  PT Treatment Interventions DME instruction;Gait training;Functional mobility training;Therapeutic activities;Therapeutic exercise;Balance training;Patient/family education   PT Goals (Current goals can be found in the Care Plan section) Acute Rehab PT Goals Patient Stated Goal: Did not state PT Goal Formulation: With patient Time For Goal Achievement: 07/11/13 Potential to Achieve Goals: Good    Frequency Min 3X/week   Barriers to discharge Decreased caregiver support      Co-evaluation               End of Session Equipment Utilized During Treatment: Gait belt Activity Tolerance: Patient tolerated treatment well Patient left: in chair;with call bell/phone within reach Nurse Communication: Mobility status         Time: 5974-1638 PT Time Calculation (min): 22 min   Charges:   PT Evaluation $Initial PT Evaluation Tier I: 1 Procedure PT Treatments $Gait Training: 8-22 mins   PT G Codes:          [facility] 07/11/2013, 2:02 PM Roney Marion, [place] Pager [phone] Office [phone]

## 2013-07-12 DIAGNOSIS — J9601 Acute respiratory failure with hypoxia: Secondary | ICD-10-CM | POA: Diagnosis present

## 2013-07-12 DIAGNOSIS — J9600 Acute respiratory failure, unspecified whether with hypoxia or hypercapnia: Secondary | ICD-10-CM

## 2013-07-12 DIAGNOSIS — J852 Abscess of lung without pneumonia: Secondary | ICD-10-CM

## 2013-07-12 LAB — CBC
HCT: 27.9 % — ABNORMAL LOW (ref 36.0–46.0)
HEMOGLOBIN: 9.2 g/dL — AB (ref 12.0–15.0)
MCH: 22.8 pg — ABNORMAL LOW (ref 26.0–34.0)
MCHC: 33 g/dL (ref 30.0–36.0)
MCV: 69.1 fL — ABNORMAL LOW (ref 78.0–100.0)
Platelets: 142 10*3/uL — ABNORMAL LOW (ref 150–400)
RBC: 4.04 MIL/uL (ref 3.87–5.11)
RDW: 13.8 % (ref 11.5–15.5)
WBC: 19.5 10*3/uL — ABNORMAL HIGH (ref 4.0–10.5)

## 2013-07-12 LAB — LEGIONELLA ANTIGEN, URINE: Legionella Antigen, Urine: NEGATIVE

## 2013-07-12 LAB — GLUCOSE, CAPILLARY: GLUCOSE-CAPILLARY: 101 mg/dL — AB (ref 70–99)

## 2013-07-12 LAB — VANCOMYCIN, TROUGH

## 2013-07-12 MED ORDER — BOOST / RESOURCE BREEZE PO LIQD
1.0000 | Freq: Two times a day (BID) | ORAL | Status: DC
  Administered 2013-07-12 – 2013-07-15 (×7): 1 via ORAL

## 2013-07-12 MED ORDER — PIPERACILLIN-TAZOBACTAM 3.375 G IVPB
3.3750 g | Freq: Three times a day (TID) | INTRAVENOUS | Status: DC
  Administered 2013-07-12 – 2013-07-15 (×9): 3.375 g via INTRAVENOUS
  Filled 2013-07-12 (×13): qty 50

## 2013-07-12 MED ORDER — VANCOMYCIN HCL 500 MG IV SOLR
500.0000 mg | Freq: Two times a day (BID) | INTRAVENOUS | Status: AC
  Administered 2013-07-12 – 2013-07-14 (×4): 500 mg via INTRAVENOUS
  Filled 2013-07-12 (×6): qty 500

## 2013-07-12 NOTE — Progress Notes (Signed)
INITIAL NUTRITION ASSESSMENT  DOCUMENTATION CODES Per approved criteria  -Non-severe (moderate) malnutrition in the context of chronic illness   INTERVENTION: Resource Breeze po BID, each supplement provides 250 kcal and 9 grams of protein RD to follow for nutrition care plan  NUTRITION DIAGNOSIS: Inadequate oral intake related to variable appetite as evidenced by patient report  Goal: Pt to meet >/= 90% of their estimated nutrition needs   Monitor:  PO & supplemental intake, weight, labs, I/O's  Reason for Assessment: Consult  80 y.o. female  Admitting Dx: Cavitary lesion of lung  ASSESSMENT: 80 year old female with PMH of HTN, breast cancer, valvular heart disease, bifascicular block that comes in for back pain she was brought  by EMS and was complaining of back pain, cough and shortness of breath.  Patient reports her appetite comes and goes; some days she may eat breakfast and lunch while others lunch and dinner; s/p bedside swallow evaluation 5/2 -- SLP recommending Regular, thin liquids; PO intake ~ 50% per flowsheet records; doesn't care for Ensure as she has problems with diarrhea with milk products; amenable to trying [employer]; RD to order.  Nutrition Focused Physical Exam:  Subcutaneous Fat:  Orbital Region: N/A Upper Arm Region: mild to moderate depletion Thoracic and Lumbar Region: N/A  Muscle: Temple Region: mild to moderate depletion Clavicle Bone Region: moderate depletion Clavicle and Acromion Bone Region: moderate depletion Scapular Bone Region: N/A Dorsal Hand: N/A Patellar Region: N/A Anterior Thigh Region: N/A Posterior Calf Region: N/A  Edema: none  Patient meets criteria for non-severe (moderate) malnutrition in the context of chronic llness as evidenced by < 75% intake of estimated energy requirement for > 1 month and mild to moderate muscle and subcutaneous fat loss.  Height: Ht Readings from Last 1 Encounters:  07/09/13 4\' 9"  (1.448  m)    Weight: Wt Readings from Last 1 Encounters:  07/11/13 128 lb 1.4 oz (58.1 kg)    Ideal Body Weight: 95 lb  % Ideal Body Weight: 134%  Wt Readings from Last 10 Encounters:  07/11/13 128 lb 1.4 oz (58.1 kg)  02/01/13 115 lb (52.164 kg)  08/19/12 113 lb 6.4 oz (51.438 kg)  06/05/12 114 lb 12.8 oz (52.073 kg)  02/19/12 115 lb 1.6 oz (52.209 kg)  11/28/11 115 lb 3.2 oz (52.254 kg)  11/25/11 116 lb 4.8 oz (52.753 kg)  11/04/11 115 lb 11.9 oz (52.5 kg)  10/09/11 115 lb (52.164 kg)  08/21/10 116 lb (52.617 kg)    Usual Body Weight: 115 lb  % Usual Body Weight: 111%  BMI:  Body mass index is 27.71 kg/(m^2).  Estimated Nutritional Needs: Kcal: 1400-1600 Protein: 70-80 gm Fluid: >/= 1.5 L  Skin: Intact  Diet Order: Sodium Restricted  EDUCATION NEEDS: -No education needs identified at this time   Intake/Output Summary (Last 24 hours) at 07/12/13 1531 Last data filed at 07/12/13 1442  Gross per 24 hour  Intake    883 ml  Output   1251 ml  Net   -368 ml    Labs:   Recent Labs Lab 07/09/13 0735 07/10/13 0850  NA 132* 142  K 3.4* 4.8  CL 94* 105  CO2 22 21  BUN 26* 15  CREATININE 0.96 0.73  CALCIUM 9.2 8.8  MG  --  1.7  GLUCOSE 243* 100*    CBG (last 3)   Recent Labs  07/10/13 0840 07/11/13 0837 07/12/13 0807  GLUCAP 103* 98 101*    Scheduled Meds: . albuterol  2.5 mg Nebulization BID  . aspirin EC  81 mg Oral Daily  . latanoprost  1 drop Right Eye QHS  . piperacillin-tazobactam (ZOSYN)  IV  3.375 g Intravenous 3 times per day  . sodium chloride  3 mL Intravenous Q12H  . sodium chloride  3 mL Intravenous Q12H  . tamoxifen  20 mg Oral Daily  . vancomycin  500 mg Intravenous Q12H    Continuous Infusions:   Past Medical History  Diagnosis Date  . Rheumatism   . Back pain   . Hypertension   . Palpitations     a. holter (7/11):  isolated PVCs and PACs  . Breast cancer     a. DCIS s/p R lumpectomy  . Anxiety   . Arthritis      celebrex use off & on for arthritis- hands, back   . Valvular heart disease     a. Echo (7/11):  EF 55-60%, Gr 1 DD, mild-mod AI, mild-mod MR, mod PI, PASP 30;  b. Echo (09/2010):  Mild LVH, EF 60%, Gr 1 DD, mild to mod AI, mild MR, mild TR, mild to mod PI, PASP 36.;   c.  Echo (05/2013):  EF 60-65%, no RWMA, mild AI, mild to mod PI   . Bifascicular block   . Hyperlipidemia     Past Surgical History  Procedure Laterality Date  . Knee surgery      Left  . Shoulder surgery      Left  . Joint replacement      2008- L knee  . Appendectomy    . Abdominal hysterectomy      partial   . Eye surgery      left eye 2013  . Ear cyst excision  11/07/2011    Procedure: CYST REMOVAL;  Surgeon: Madilyn Hook, DO;  Location: [facility];  Service: General;  Laterality: Right;  Removal Cyst Right Upper Chest    Arthur Holms, RD, LDN Pager #: [phone] After-Hours Pager #: [phone]

## 2013-07-12 NOTE — Progress Notes (Signed)
NTS SXN done to obtain sputum. Sats 96 on RA, HR 108, RR 22. Sputum obtained labeled and sent to lab. Pt tol fairly well.

## 2013-07-12 NOTE — Consult Note (Signed)
[facility]  Date of Admission:  07/09/2013  Date of Consult:  07/12/2013  Reason for Consult: Cavitary lung lesion, duration of therapy Referring Physician: Hongalgi  Impression/Recommendation Cavitary lung lesion/Necrotzing Pneumonia Hx of TB exposure  Would Check Quantiferon Check HIV Check Sputum AFB x 3 Move pt to isolation Change cefepime to zosyn  Comment- Some concern about anaerobes given her poor dentition.  Her hx of TB exposure (her father many years ago by her account) is also concerning.   Thank you so much for this interesting consult,   Brianna Hunter (pager) [phone] [email or web address]  Brianna Hunter is an 80 y.o. female.  HPI: 80 yo F with hx of HTN, Breast CA (2013), brought to [facility] on 5[facility]1 with back pain, found to be tachycardic, febrile, WBC of 26.4 and to have a cavitary pneumonia. This was further eval with CT showing necrotizing pneumonia. She was initially started on ceftriaxone/azithro, this was then changed to vanco/cefepime.  Still having productive cough (yellow sputum), denies hemoptysis.  Upon further questioning, states that her dad had Tb. She has never been tested for TB.    Past Medical History  Diagnosis Date  . Rheumatism   . Back pain   . Hypertension   . Palpitations     a. holter (7/11):  isolated PVCs and PACs  . Breast cancer     a. DCIS s/p R lumpectomy  . Anxiety   . Arthritis     celebrex use off & on for arthritis- hands, back   . Valvular heart disease     a. Echo (7/11):  EF 55-60%, Gr 1 DD, mild-mod AI, mild-mod MR, mod PI, PASP 30;  b. Echo (09/2010):  Mild LVH, EF 60%, Gr 1 DD, mild to mod AI, mild MR, mild TR, mild to mod PI, PASP 36.;   c.  Echo (05/2013):  EF 60-65%, no RWMA, mild AI, mild to mod PI   . Bifascicular block   . Hyperlipidemia     Past Surgical History  Procedure Laterality Date  . Knee surgery      Left  . Shoulder surgery      Left  . Joint replacement       2008- L knee  . Appendectomy    . Abdominal hysterectomy      partial   . Eye surgery      left eye 2013  . Ear cyst excision  11/07/2011    Procedure: CYST REMOVAL;  Surgeon: Lodema Pilot, DO;  Location: [facility] OR;  Service: General;  Laterality: Right;  Removal Cyst Right Upper Chest     Allergies  Allergen Reactions  . Milk-Related Compounds Other (See Comments)    Frequent bowel movements     Medications:  Scheduled: . albuterol  2.5 mg Nebulization BID  . aspirin EC  81 mg Oral Daily  . ceFEPIme (MAXIPIME) 2 GM IVP  2 g Intravenous Q24H  . latanoprost  1 drop Right Eye QHS  . pneumococcal 23 valent vaccine  0.5 mL Intramuscular Tomorrow-1000  . sodium chloride  3 mL Intravenous Q12H  . sodium chloride  3 mL Intravenous Q12H  . tamoxifen  20 mg Oral Daily  . vancomycin  750 mg Intravenous Q24H    Abtx:  Anti-infectives   Start     Dose/Rate Route Frequency Ordered Stop   07/10/13 0900  vancomycin (VANCOCIN) IVPB 750 mg/150 ml premix  Status:  Discontinued     750 mg  150 mL/hr over 60 Minutes Intravenous Every 24 hours 07/09/13 0905 07/09/13 1856   07/10/13 0900  vancomycin (VANCOCIN) IVPB 750 mg/150 ml premix     750 mg 150 mL/hr over 60 Minutes Intravenous Every 24 hours 07/09/13 1901     07/10/13 0830  ceFEPIme (MAXIPIME) 2 g in dextrose 5 % 50 mL IVPB     2 g 100 mL/hr over 30 Minutes Intravenous Every 24 hours 07/10/13 0820     07/09/13 1930  cefTRIAXone (ROCEPHIN) 1 g in dextrose 5 % 50 mL IVPB  Status:  Discontinued     1 g 100 mL/hr over 30 Minutes Intravenous Every 24 hours 07/09/13 1856 07/10/13 0740   07/09/13 1930  azithromycin (ZITHROMAX) 500 mg in dextrose 5 % 250 mL IVPB  Status:  Discontinued     500 mg 250 mL/hr over 60 Minutes Intravenous Every 24 hours 07/09/13 1855 07/10/13 0740   07/09/13 1400  piperacillin-tazobactam (ZOSYN) IVPB 3.375 g  Status:  Discontinued     3.375 g 12.5 mL/hr over 240 Minutes Intravenous 3 times per day 07/09/13 0905  07/09/13 1856   07/09/13 0800  piperacillin-tazobactam (ZOSYN) IVPB 3.375 g     3.375 g 100 mL/hr over 30 Minutes Intravenous  Once 07/09/13 0749 07/09/13 0920   07/09/13 0800  vancomycin (VANCOCIN) IVPB 1000 mg/200 mL premix     1,000 mg 200 mL/hr over 60 Minutes Intravenous  Once 07/09/13 0749 07/09/13 1006      Total days of antibiotics: Rocephin and azithromycin 5/1-5/2  vancomycin and cefepime 5/2-->           Social History:  reports that she has never smoked. She does not have any smokeless tobacco history on file. She reports that she does not drink alcohol or use illicit drugs.  Family History  Problem Relation Age of Onset  . Diabetes Mother   . Other Mother     Bleeding disorder  . Diabetes Father   . Other Father     Bleeding disorder    General ROS: eating well/no dysphagia, no dysuria, normal BM, see HPI.   Blood pressure 137/81, pulse 98, temperature 99 F (37.2 C), temperature source Oral, resp. rate 21, height 4\' 9"  (1.448 m), weight 58.1 kg (128 lb 1.4 oz), SpO2 97.00%. General appearance: alert, cooperative and no distress Eyes: L eye cloudy.  Throat: poor dentition Neck: no adenopathy and supple, symmetrical, trachea midline Lungs: rhonchi, decreased breath sounds on R.  Heart: regular rate and rhythm and systolic murmur: early systolic 2/6, crescendo at 2nd right intercostal space Abdomen: normal findings: bowel sounds normal and soft, non-tender Extremities: edema none   Results for orders placed during the hospital encounter of 07/09/13 (from the past 48 hour(s))  LEGIONELLA ANTIGEN, URINE     Status: None   Collection Time    07/10/13  4:40 PM      Result Value Ref Range   Specimen Description URINE, CATHETERIZED     Special Requests NONE     Legionella Antigen, Urine       Value: Negative for Legionella pneumophilia serogroup 1     Performed at [facility]   Report Status 07/12/2013 FINAL    STREP PNEUMONIAE URINARY ANTIGEN      Status: None   Collection Time    07/10/13  4:40 PM      Result Value Ref Range   Strep Pneumo Urinary Antigen NEGATIVE  NEGATIVE   Comment:  Infection due to S. pneumoniae     cannot be absolutely ruled out     since the antigen present     may be below the detection limit     of the test.  CBC     Status: Abnormal   Collection Time    07/11/13  4:13 AM      Result Value Ref Range   WBC 24.3 (*) 4.0 - 10.5 K/uL   RBC 4.21  3.87 - 5.11 MIL/uL   Hemoglobin 9.5 (*) 12.0 - 15.0 g/dL   HCT 29.1 (*) 36.0 - 46.0 %   MCV 69.1 (*) 78.0 - 100.0 fL   MCH 22.6 (*) 26.0 - 34.0 pg   MCHC 32.6  30.0 - 36.0 g/dL   RDW 14.0  11.5 - 15.5 %   Platelets 129 (*) 150 - 400 K/uL  GLUCOSE, CAPILLARY     Status: None   Collection Time    07/11/13  8:37 AM      Result Value Ref Range   Glucose-Capillary 98  70 - 99 mg/dL   Comment 1 Documented in Chart    CBC     Status: Abnormal   Collection Time    07/12/13  3:43 AM      Result Value Ref Range   WBC 19.5 (*) 4.0 - 10.5 K/uL   RBC 4.04  3.87 - 5.11 MIL/uL   Hemoglobin 9.2 (*) 12.0 - 15.0 g/dL   HCT 27.9 (*) 36.0 - 46.0 %   MCV 69.1 (*) 78.0 - 100.0 fL   MCH 22.8 (*) 26.0 - 34.0 pg   MCHC 33.0  30.0 - 36.0 g/dL   RDW 13.8  11.5 - 15.5 %   Platelets 142 (*) 150 - 400 K/uL  GLUCOSE, CAPILLARY     Status: Abnormal   Collection Time    07/12/13  8:07 AM      Result Value Ref Range   Glucose-Capillary 101 (*) 70 - 99 mg/dL   Comment 1 Documented in Chart    VANCOMYCIN, TROUGH     Status: Abnormal   Collection Time    07/12/13  8:24 AM      Result Value Ref Range   Vancomycin Tr <5.0 (*) 10.0 - 20.0 ug/mL      Component Value Date/Time   SDES URINE, CATHETERIZED 07/10/2013 1640   SPECREQUEST NONE 07/10/2013 1640   CULT  Value:        BLOOD CULTURE RECEIVED NO GROWTH TO DATE CULTURE WILL BE HELD FOR 5 DAYS BEFORE ISSUING A FINAL NEGATIVE REPORT Performed at [facility] 07/09/2013 2031   REPTSTATUS 07/12/2013 FINAL 07/10/2013  1640   No results found. Recent Results (from the past 240 hour(s))  CULTURE, BLOOD (ROUTINE X 2)     Status: None   Collection Time    07/09/13  7:49 AM      Result Value Ref Range Status   Specimen Description BLOOD RIGHT ANTECUBITAL   Final   Special Requests BOTTLES DRAWN AEROBIC AND ANAEROBIC 10MLS   Final   Culture  Setup Time     Final   Value: 07/09/2013 12:52     Performed at [facility]   Culture     Final   Value:        BLOOD CULTURE RECEIVED NO GROWTH TO DATE CULTURE WILL BE HELD FOR 5 DAYS BEFORE ISSUING A FINAL NEGATIVE REPORT     Performed at [facility]  Report Status PENDING   Incomplete  CULTURE, BLOOD (ROUTINE X 2)     Status: None   Collection Time    07/09/13  8:02 AM      Result Value Ref Range Status   Specimen Description BLOOD LEFT ANTECUBITAL   Final   Special Requests BOTTLES DRAWN AEROBIC AND ANAEROBIC 10CC   Final   Culture  Setup Time     Final   Value: 07/09/2013 12:52     Performed at [facility]   Culture     Final   Value:        BLOOD CULTURE RECEIVED NO GROWTH TO DATE CULTURE WILL BE HELD FOR 5 DAYS BEFORE ISSUING A FINAL NEGATIVE REPORT     Performed at [facility]   Report Status PENDING   Incomplete  URINE CULTURE     Status: None   Collection Time    07/09/13  9:15 AM      Result Value Ref Range Status   Specimen Description URINE, CATHETERIZED   Final   Special Requests NONE   Final   Culture  Setup Time     Final   Value: 07/09/2013 09:51     Performed at [place]     Final   Value: NO GROWTH     Performed at [facility]   Culture     Final   Value: NO GROWTH     Performed at [facility]   Report Status 07/10/2013 FINAL   Final  MRSA PCR SCREENING     Status: None   Collection Time    07/09/13  7:00 PM      Result Value Ref Range Status   MRSA by PCR NEGATIVE  NEGATIVE Final   Comment:            The GeneXpert MRSA Assay (FDA     approved  for NASAL specimens     only), is one component of a     comprehensive MRSA colonization     surveillance program. It is not     intended to diagnose MRSA     infection nor to guide or     monitor treatment for     MRSA infections.  CULTURE, BLOOD (ROUTINE X 2)     Status: None   Collection Time    07/09/13  8:25 PM      Result Value Ref Range Status   Specimen Description BLOOD ARM RIGHT   Final   Special Requests BOTTLES DRAWN AEROBIC ONLY 2CC   Final   Culture  Setup Time     Final   Value: 07/10/2013 02:04     Performed at [facility]   Culture     Final   Value:        BLOOD CULTURE RECEIVED NO GROWTH TO DATE CULTURE WILL BE HELD FOR 5 DAYS BEFORE ISSUING A FINAL NEGATIVE REPORT     Performed at [facility]   Report Status PENDING   Incomplete  CULTURE, BLOOD (ROUTINE X 2)     Status: None   Collection Time    07/09/13  8:31 PM      Result Value Ref Range Status   Specimen Description BLOOD HAND RIGHT   Final   Special Requests     Final   Value: BOTTLES DRAWN AEROBIC AND ANAEROBIC 10CCBLUE 6CCRED   Culture  Setup Time     Final  Value: 07/10/2013 02:05     Performed at [facility]   Culture     Final   Value:        BLOOD CULTURE RECEIVED NO GROWTH TO DATE CULTURE WILL BE HELD FOR 5 DAYS BEFORE ISSUING A FINAL NEGATIVE REPORT     Performed at [facility]   Report Status PENDING   Incomplete      07/12/2013, 11:22 AM     LOS: 3 days     **Disclaimer: This note may have been dictated with voice recognition software. Similar sounding words can inadvertently be transcribed and this note may contain transcription errors which may not have been corrected upon publication of note.**

## 2013-07-12 NOTE — Progress Notes (Addendum)
[facility] PROGRESS NOTE  Brianna Hunter ZOX:[id number] DOB: 1928-06-14 DOA: 07/09/2013 PCP: [name], MD   Brief narrative 80 year old female with history of hypertension, breast cancer, anxiety, arthritis, history of valvular heart disease who was brought in by EMS for low back pain and fatigue. However she was tachycardic and complaining of cough and shortness of breath for an unclear duration. She also reports having hemoptysis for past few days. On admission she had significant leukocytosis, fever and tachycardia. Chest x-ray done showed cavitary mass over the right upper lobe which was followed up with a CT scan of the chest with contrast showing right upper lobe consolidation with central cavitation and patchy areas of infiltrate bilaterally with small right pleural effusion suggestive of necrotizing pneumonia. Admitted to step down for sepsis.  Assessment/Plan: Sepsis/cavitatory lung lesion/necrotizing pneumonia/R/O TB - secondary to underlying necrotizing pneumonia.  -Patient was started on empiric rocephin and azithromycin on admission which was changed to IV vancomycin and cefepime.  - HIV antibody negative.  - No samples sent for sputum cytology or AFB.  - Infectious disease consulted on 5/4 then recommend checking Quanitiferon, HIV, sputum AFB x3, moving patient to airborne isolation and changing cefepime to Zosyn. Patient has history of TB exposure and hence ruling out TB. -Will need followup CT chest in 4-6 weeks to ensure resolution of above findings.  -Blood cultures x4: Negative to date. Urine Legionella and streptococcal antigen: Negative   Acute hypoxic respiratory failure Secondary to underlying PNA. Hypoxia has resolved.  Hemoptysis Likely related to underlying necrotizing pneumonia. Now improved. Monitor with current antibiotic regimen. Workup as above.  History of breast cancer Continue  tamoxifen  Hypokalemia Replenished  Thrombocytopenia/Anemia/leukocytosis   thrombocytopenia improving. Hemoglobin stable. Leukocytosis improving.  Protein calorie malnutrition Nutrition consult  generalized weakness Get PT eval  Dental caries - Outpatient dental followup for extraction.  Hypertension - Currently controlled off of home medications. Monitor    Code Status: Full code Family Communication: None at bedside . Discussed with daughter via phone on 5/4 Disposition Plan: Transferring to airborne isolation, regular medical bed.   Consultants:  Infectious disease  Procedures:  None  Antibiotics:  IV Rocephin and azithromycin (5/1-5/2)  IV Cefepime (5/2-5/4)   IV vancomycin 5/2>  IV Zosyn 5/4>  HPI/Subjective:  nonproductive cough. Denies dyspnea or chest pain. No reports of coughing up blood.   Objective: Filed Vitals:   07/12/13 1100  BP: 137/81  Pulse: 98  Temp: 99 F (37.2 C)  Resp: 21    Intake/Output Summary (Last 24 hours) at 07/12/13 1325 Last data filed at 07/12/13 1200  Gross per 24 hour  Intake    643 ml  Output   1251 ml  Net   -608 ml   Filed Weights   07/09/13 0723 07/09/13 1900 07/11/13 0407  Weight: 53.071 kg (117 lb) 57.4 kg (126 lb 8.7 oz) 58.1 kg (128 lb 1.4 oz)    Exam:   General: Elderly thin built female  lying comfortably in bed   Cardiovascular: Normal S1 and S2, no murmurs rub or gallop. Telemetry: Sinus rhythm   Respiratory:  crackles over right upper lung. Rest of lung fields clear to auscultation. No increased work of breathing.   Abdomen: Soft, nontender, nondistended, bowel sounds present  CNS: AAO x3. No focal neurological deficits.  Extremities: Mildly edematous right forearm without acute findings-from IV infiltration.  ENT: Multiple missing teeth and bilateral caries  Data Reviewed: Basic Metabolic Panel:  Recent Labs Lab 07/09/13 0735 07/10/13 0850  NA 132* 142  K 3.4* 4.8  CL 94*  105  CO2 22 21  GLUCOSE 243* 100*  BUN 26* 15  CREATININE 0.96 0.73  CALCIUM 9.2 8.8  MG  --  1.7   Liver Function Tests:  Recent Labs Lab 07/09/13 0735 07/10/13 0850  AST 18 27  ALT 11 10  ALKPHOS 75 84  BILITOT 0.3 0.4  PROT 7.1 6.5  ALBUMIN 2.9* 2.3*   No results found for this basename: LIPASE, AMYLASE,  in the last 168 hours No results found for this basename: AMMONIA,  in the last 168 hours CBC:  Recent Labs Lab 07/09/13 0735 07/10/13 0850 07/11/13 0413 07/12/13 0343  WBC 26.4* 28.9* 24.3* 19.5*  NEUTROABS 24.6*  --   --   --   HGB 11.2* 10.4* 9.5* 9.2*  HCT 33.1* 30.9* 29.1* 27.9*  MCV 68.4* 69.0* 69.1* 69.1*  PLT PLATELET CLUMPS NOTED ON SMEAR, COUNT APPEARS DECREASED 121* 129* 142*   Cardiac Enzymes: No results found for this basename: CKTOTAL, CKMB, CKMBINDEX, TROPONINI,  in the last 168 hours BNP (last 3 results)  Recent Labs  07/09/13 0741  PROBNP 596.8*   CBG:  Recent Labs Lab 07/10/13 0840 07/11/13 0837 07/12/13 0807  GLUCAP 103* 98 101*    Recent Results (from the past 240 hour(s))  CULTURE, BLOOD (ROUTINE X 2)     Status: None   Collection Time    07/09/13  7:49 AM      Result Value Ref Range Status   Specimen Description BLOOD RIGHT ANTECUBITAL   Final   Special Requests BOTTLES DRAWN AEROBIC AND ANAEROBIC 10MLS   Final   Culture  Setup Time     Final   Value: 07/09/2013 12:52     Performed at [facility]   Culture     Final   Value:        BLOOD CULTURE RECEIVED NO GROWTH TO DATE CULTURE WILL BE HELD FOR 5 DAYS BEFORE ISSUING A FINAL NEGATIVE REPORT     Performed at [facility]   Report Status PENDING   Incomplete  CULTURE, BLOOD (ROUTINE X 2)     Status: None   Collection Time    07/09/13  8:02 AM      Result Value Ref Range Status   Specimen Description BLOOD LEFT ANTECUBITAL   Final   Special Requests BOTTLES DRAWN AEROBIC AND ANAEROBIC 10CC   Final   Culture  Setup Time     Final   Value: 07/09/2013  12:52     Performed at [facility]   Culture     Final   Value:        BLOOD CULTURE RECEIVED NO GROWTH TO DATE CULTURE WILL BE HELD FOR 5 DAYS BEFORE ISSUING A FINAL NEGATIVE REPORT     Performed at [facility]   Report Status PENDING   Incomplete  URINE CULTURE     Status: None   Collection Time    07/09/13  9:15 AM      Result Value Ref Range Status   Specimen Description URINE, CATHETERIZED   Final   Special Requests NONE   Final   Culture  Setup Time     Final   Value: 07/09/2013 09:51     Performed at [place]     Final   Value: NO GROWTH     Performed at [facility]  Final   Value: NO GROWTH     Performed at [facility]   Report Status 07/10/2013 FINAL   Final  MRSA PCR SCREENING     Status: None   Collection Time    07/09/13  7:00 PM      Result Value Ref Range Status   MRSA by PCR NEGATIVE  NEGATIVE Final   Comment:            The GeneXpert MRSA Assay (FDA     approved for NASAL specimens     only), is one component of a     comprehensive MRSA colonization     surveillance program. It is not     intended to diagnose MRSA     infection nor to guide or     monitor treatment for     MRSA infections.  CULTURE, BLOOD (ROUTINE X 2)     Status: None   Collection Time    07/09/13  8:25 PM      Result Value Ref Range Status   Specimen Description BLOOD ARM RIGHT   Final   Special Requests BOTTLES DRAWN AEROBIC ONLY 2CC   Final   Culture  Setup Time     Final   Value: 07/10/2013 02:04     Performed at [facility]   Culture     Final   Value:        BLOOD CULTURE RECEIVED NO GROWTH TO DATE CULTURE WILL BE HELD FOR 5 DAYS BEFORE ISSUING A FINAL NEGATIVE REPORT     Performed at [facility]   Report Status PENDING   Incomplete  CULTURE, BLOOD (ROUTINE X 2)     Status: None   Collection Time    07/09/13  8:31 PM      Result Value Ref Range Status   Specimen Description BLOOD  HAND RIGHT   Final   Special Requests     Final   Value: BOTTLES DRAWN AEROBIC AND ANAEROBIC 10CCBLUE 6CCRED   Culture  Setup Time     Final   Value: 07/10/2013 02:05     Performed at [facility]   Culture     Final   Value:        BLOOD CULTURE RECEIVED NO GROWTH TO DATE CULTURE WILL BE HELD FOR 5 DAYS BEFORE ISSUING A FINAL NEGATIVE REPORT     Performed at [facility]   Report Status PENDING   Incomplete     Studies: No results found.  Scheduled Meds: . albuterol  2.5 mg Nebulization BID  . aspirin EC  81 mg Oral Daily  . latanoprost  1 drop Right Eye QHS  . piperacillin-tazobactam (ZOSYN)  IV  3.375 g Intravenous 3 times per day  . pneumococcal 23 valent vaccine  0.5 mL Intramuscular Tomorrow-1000  . sodium chloride  3 mL Intravenous Q12H  . sodium chloride  3 mL Intravenous Q12H  . tamoxifen  20 mg Oral Daily  . vancomycin  500 mg Intravenous Q12H   Continuous Infusions:     Time spent: 35 minutes    Modena Jansky, MD, FACP, [facility]. [facility] Pager [phone]  If 7PM-7AM, please contact night-coverage [email or web address] Password [email or web address] 07/12/2013, 1:31 PM  07/12/2013, 1:25 PM  LOS: 3 days

## 2013-07-12 NOTE — Progress Notes (Addendum)
ANTIBIOTIC CONSULT NOTE - FOLLOW UP  Pharmacy Consult for Vancomycin/cefepime Indication: pneumonia  Allergies  Allergen Reactions  . Milk-Related Compounds Other (See Comments)    Frequent bowel movements     Patient Measurements: Height: 4\' 9"  (144.8 cm) Weight: 128 lb 1.4 oz (58.1 kg) IBW/kg (Calculated) : 38.6   Vital Signs: Temp: 99 F (37.2 C) (05/04 1100) Temp src: Oral (05/04 1100) BP: 137/81 mmHg (05/04 1100) Pulse Rate: 98 (05/04 1100) Intake/Output from previous day: 05/03 0701 - 05/04 0700 In: 363 [P.O.:120; I.V.:43; IV Piggyback:200] Out: 1100 [Urine:1100] Intake/Output from this shift: Total I/O In: 460 [P.O.:240; I.V.:20; IV Piggyback:200] Out: 1251 [Urine:1250; Stool:1]  Labs:  Recent Labs  07/10/13 0850 07/11/13 0413 07/12/13 0343  WBC 28.9* 24.3* 19.5*  HGB 10.4* 9.5* 9.2*  PLT 121* 129* 142*  CREATININE 0.73  --   --    Estimated Creatinine Clearance: 39 ml/min (by C-G formula based on Cr of 0.73).  Recent Labs  07/12/13 0824  VANCOTROUGH <5.0*    Assessment: 85 TOF on D#4 vancomycin, D#3 cefepime for for  Necrotizing PNA. Tm 100.6, wbc still elevated but trending down 28.9 >> 19.5. Vancomycin trough < 5, per RN, trough was drawn before dose was given. No new BMET since 5/2, UOP 0.8 ml/kg/hr.   vanc 5/1>> Zosyn 5/1>>5/2 Cefepime 5/2 >> Azithro/rocephin 5/1 x 1  5/1 BCx x 4 - 5/1 Urine - neg   Goal of Therapy:  Vancomycin trough level 15-20 mcg/ml  Plan:  - Increase Vancomycin dose to 500mg  IV Q 12 hrs, next dose 2200 - Continue cefepime 2g IV Q 24 hrs  Maryanna Shape, PharmD, BCPS  Clinical Pharmacist  Pager: [phone]   07/12/2013,11:24 AM   Addendum: ID consulted, cefepime change to zosyn to add anaerobes coverage.  Plan: Zosyn 3.375g IV Q 8 hrs (4 hr infusion)

## 2013-07-13 DIAGNOSIS — Z201 Contact with and (suspected) exposure to tuberculosis: Secondary | ICD-10-CM

## 2013-07-13 LAB — CBC
HCT: 27.6 % — ABNORMAL LOW (ref 36.0–46.0)
Hemoglobin: 9.1 g/dL — ABNORMAL LOW (ref 12.0–15.0)
MCH: 22.7 pg — ABNORMAL LOW (ref 26.0–34.0)
MCHC: 33 g/dL (ref 30.0–36.0)
MCV: 68.8 fL — AB (ref 78.0–100.0)
Platelets: 160 10*3/uL (ref 150–400)
RBC: 4.01 MIL/uL (ref 3.87–5.11)
RDW: 13.5 % (ref 11.5–15.5)
WBC: 17.3 10*3/uL — AB (ref 4.0–10.5)

## 2013-07-13 NOTE — Clinical Social Work Psychosocial (Signed)
Clinical Social Work Department BRIEF PSYCHOSOCIAL ASSESSMENT 07/13/2013  Patient:  Brianna Hunter, Brianna Hunter     Account Number:  [id number]     Admit date:  07/09/2013  Clinical Social Worker:  Donna Christen  Date/Time:  07/13/2013 01:31 PM  Referred by:  Physician  Date Referred:  07/13/2013 Referred for  SNF Placement   Other Referral:   none.   Interview type:  Patient Other interview type:   none.    PSYCHOSOCIAL DATA Living Status:  ALONE Admitted from facility:   Level of care:   Primary support name:  May Clymer Primary support relationship to patient:  CHILD, ADULT Degree of support available:   Adequate support system. Pt reports pt's daughter "checks in" on pt.    CURRENT CONCERNS Current Concerns  Post-Acute Placement   Other Concerns:   none.    SOCIAL WORK ASSESSMENT / PLAN CSW received consult for SNF placement at time of discharge. CSW met with pt at bedside to discuss discharge disposition. Pt reported pt currently living alone at the "[place] housing authority." Pt informed CSW of pt's preference to return home at time of discharge as pt can "walk to therapy, if needed."    Pt agreeable to SNF search at this time as a "back up plan" to home with home health services.    CSW to continue to follow and assist with discharge planning needs.   Assessment/plan status:  Psychosocial Support/Ongoing Assessment of Needs Other assessment/ plan:   none.   Information/referral to community resources:   [facility] options.    PATIENTS/FAMILYS RESPONSE TO PLAN OF CARE: Pt first refused SNF discharge disposition, but was agreeable to SNF as a "back up plan" to home with home health services. RNCM notified of information above.       Pati Gallo, [place] Social Worker [phone]

## 2013-07-13 NOTE — Progress Notes (Signed)
[facility] PROGRESS NOTE  Brianna Hunter WUJ:[id number] DOB: Dec 04, 1928 DOA: 07/09/2013 PCP: [name], MD   Brief narrative 80 year old female with history of hypertension, breast cancer, anxiety, arthritis, history of valvular heart disease who was brought in by EMS for low back pain and fatigue. However she was tachycardic and complaining of cough and shortness of breath for an unclear duration. She also reports having hemoptysis for past few days. On admission she had significant leukocytosis, fever and tachycardia. Chest x-ray done showed cavitary mass over the right upper lobe which was followed up with a CT scan of the chest with contrast showing right upper lobe consolidation with central cavitation and patchy areas of infiltrate bilaterally with small right pleural effusion suggestive of necrotizing pneumonia. Admitted to step down for sepsis.  Assessment/Plan: Sepsis/cavitatory lung lesion/necrotizing pneumonia/R/O TB - secondary to underlying necrotizing pneumonia.  -Patient was started on empiric rocephin and azithromycin on admission which was changed to IV vancomycin and cefepime.  - HIV antibody negative.  - Infectious disease consulted on 5/4 and recommended checking Quanitiferon, HIV, sputum AFB x3, moving patient to airborne isolation and changing cefepime to Zosyn. Patient has history of TB exposure and hence ruling out TB. -Will need followup CT chest in 4-6 weeks to ensure resolution of above findings.  -Blood cultures x4: Negative to date. Urine Legionella and streptococcal antigen: Negative -  Sputum culture, Gold Quantiferon are pending  Acute hypoxic respiratory failure Secondary to underlying PNA. Hypoxia has resolved.  Hemoptysis Likely related to underlying necrotizing pneumonia. Now improved. Monitor with current antibiotic regimen. Workup as above.  History of breast cancer Continue  tamoxifen  Hypokalemia Replenished  Thrombocytopenia/Anemia/leukocytosis   thrombocytopenia resolved. Hemoglobin stable. Leukocytosis improving.  Protein calorie malnutrition Nutrition consult  generalized weakness PT eval appreciated-recommend SNF  Dental caries - Outpatient dental followup for extraction.  Hypertension - Currently controlled off of home medications. Monitor    Code Status: Full code Family Communication: None at bedside . Discussed with daughter via phone on 5/4 Disposition Plan: Continue airborne isolation, regular medical bed. To SNF when medically stable   Consultants:  Infectious disease  Procedures:  None  Antibiotics:  IV Rocephin and azithromycin (5/1-5/2)  IV Cefepime (5/2-5/4)   IV vancomycin 5/2>  IV Zosyn 5/4>  HPI/Subjective:  nonproductive cough and minimal hemoptysis. Denies dyspnea or chest pain.  Objective: Filed Vitals:   07/13/13 0520  BP: 137/62  Pulse: 81  Temp: 99.2 F (37.3 C)  Resp: 22    Intake/Output Summary (Last 24 hours) at 07/13/13 1328 Last data filed at 07/12/13 1800  Gross per 24 hour  Intake    360 ml  Output      0 ml  Net    360 ml   Filed Weights   07/09/13 1900 07/11/13 0407 07/13/13 0500  Weight: 57.4 kg (126 lb 8.7 oz) 58.1 kg (128 lb 1.4 oz) 58.6 kg (129 lb 3 oz)    Exam:   General: Elderly thin built female  lying comfortably in bed   Cardiovascular: Normal S1 and S2, no murmurs rub or gallop.   Respiratory:  crackles over right upper lung. Rest of lung fields clear to auscultation. No increased work of breathing.   Abdomen: Soft, nontender, nondistended, bowel sounds present  CNS: AAO x3. No focal neurological deficits.  Extremities: Mildly edematous right forearm without acute findings-from IV infiltration-improved.  ENT: Multiple missing teeth and bilateral caries  Data Reviewed: Basic Metabolic Panel:  Recent Labs Lab 07/09/13 0735 07/10/13  0850  NA 132* 142   K 3.4* 4.8  CL 94* 105  CO2 22 21  GLUCOSE 243* 100*  BUN 26* 15  CREATININE 0.96 0.73  CALCIUM 9.2 8.8  MG  --  1.7   Liver Function Tests:  Recent Labs Lab 07/09/13 0735 07/10/13 0850  AST 18 27  ALT 11 10  ALKPHOS 75 84  BILITOT 0.3 0.4  PROT 7.1 6.5  ALBUMIN 2.9* 2.3*   No results found for this basename: LIPASE, AMYLASE,  in the last 168 hours No results found for this basename: AMMONIA,  in the last 168 hours CBC:  Recent Labs Lab 07/09/13 0735 07/10/13 0850 07/11/13 0413 07/12/13 0343 07/13/13 0540  WBC 26.4* 28.9* 24.3* 19.5* 17.3*  NEUTROABS 24.6*  --   --   --   --   HGB 11.2* 10.4* 9.5* 9.2* 9.1*  HCT 33.1* 30.9* 29.1* 27.9* 27.6*  MCV 68.4* 69.0* 69.1* 69.1* 68.8*  PLT PLATELET CLUMPS NOTED ON SMEAR, COUNT APPEARS DECREASED 121* 129* 142* 160   Cardiac Enzymes: No results found for this basename: CKTOTAL, CKMB, CKMBINDEX, TROPONINI,  in the last 168 hours BNP (last 3 results)  Recent Labs  07/09/13 0741  PROBNP 596.8*   CBG:  Recent Labs Lab 07/10/13 0840 07/11/13 0837 07/12/13 0807  GLUCAP 103* 98 101*    Recent Results (from the past 240 hour(s))  CULTURE, BLOOD (ROUTINE X 2)     Status: None   Collection Time    07/09/13  7:49 AM      Result Value Ref Range Status   Specimen Description BLOOD RIGHT ANTECUBITAL   Final   Special Requests BOTTLES DRAWN AEROBIC AND ANAEROBIC 10MLS   Final   Culture  Setup Time     Final   Value: 07/09/2013 12:52     Performed at [facility]   Culture     Final   Value:        BLOOD CULTURE RECEIVED NO GROWTH TO DATE CULTURE WILL BE HELD FOR 5 DAYS BEFORE ISSUING A FINAL NEGATIVE REPORT     Performed at [facility]   Report Status PENDING   Incomplete  CULTURE, BLOOD (ROUTINE X 2)     Status: None   Collection Time    07/09/13  8:02 AM      Result Value Ref Range Status   Specimen Description BLOOD LEFT ANTECUBITAL   Final   Special Requests BOTTLES DRAWN AEROBIC AND  ANAEROBIC 10CC   Final   Culture  Setup Time     Final   Value: 07/09/2013 12:52     Performed at [facility]   Culture     Final   Value:        BLOOD CULTURE RECEIVED NO GROWTH TO DATE CULTURE WILL BE HELD FOR 5 DAYS BEFORE ISSUING A FINAL NEGATIVE REPORT     Performed at [facility]   Report Status PENDING   Incomplete  URINE CULTURE     Status: None   Collection Time    07/09/13  9:15 AM      Result Value Ref Range Status   Specimen Description URINE, CATHETERIZED   Final   Special Requests NONE   Final   Culture  Setup Time     Final   Value: 07/09/2013 09:51     Performed at [facility]     Final   Value: NO GROWTH  Performed at [facility]     Final   Value: NO GROWTH     Performed at [facility]   Report Status 07/10/2013 FINAL   Final  MRSA PCR SCREENING     Status: None   Collection Time    07/09/13  7:00 PM      Result Value Ref Range Status   MRSA by PCR NEGATIVE  NEGATIVE Final   Comment:            The GeneXpert MRSA Assay (FDA     approved for NASAL specimens     only), is one component of a     comprehensive MRSA colonization     surveillance program. It is not     intended to diagnose MRSA     infection nor to guide or     monitor treatment for     MRSA infections.  CULTURE, BLOOD (ROUTINE X 2)     Status: None   Collection Time    07/09/13  8:25 PM      Result Value Ref Range Status   Specimen Description BLOOD ARM RIGHT   Final   Special Requests BOTTLES DRAWN AEROBIC ONLY 2CC   Final   Culture  Setup Time     Final   Value: 07/10/2013 02:04     Performed at [facility]   Culture     Final   Value:        BLOOD CULTURE RECEIVED NO GROWTH TO DATE CULTURE WILL BE HELD FOR 5 DAYS BEFORE ISSUING A FINAL NEGATIVE REPORT     Performed at [facility]   Report Status PENDING   Incomplete  CULTURE, BLOOD (ROUTINE X 2)     Status: None   Collection Time    07/09/13   8:31 PM      Result Value Ref Range Status   Specimen Description BLOOD HAND RIGHT   Final   Special Requests     Final   Value: BOTTLES DRAWN AEROBIC AND ANAEROBIC 10CCBLUE 6CCRED   Culture  Setup Time     Final   Value: 07/10/2013 02:05     Performed at [facility]   Culture     Final   Value:        BLOOD CULTURE RECEIVED NO GROWTH TO DATE CULTURE WILL BE HELD FOR 5 DAYS BEFORE ISSUING A FINAL NEGATIVE REPORT     Performed at [facility]   Report Status PENDING   Incomplete  CULTURE, RESPIRATORY (NON-EXPECTORATED)     Status: None   Collection Time    07/12/13  5:28 PM      Result Value Ref Range Status   Specimen Description TRACHEAL ASPIRATE   Final   Special Requests Normal   Final   Gram Stain     Final   Value: FEW WBC PRESENT,BOTH PMN AND MONONUCLEAR     RARE SQUAMOUS EPITHELIAL CELLS PRESENT     FEW GRAM POSITIVE COCCI IN PAIRS     Performed at [facility]   Culture     Final   Value: Culture reincubated for better growth     Performed at [facility]   Report Status PENDING   Incomplete     Studies: No results found.  Scheduled Meds: . albuterol  2.5 mg Nebulization BID  . aspirin EC  81 mg Oral Daily  . feeding supplement (RESOURCE BREEZE)  1 Container  Oral BID BM  . latanoprost  1 drop Right Eye QHS  . piperacillin-tazobactam (ZOSYN)  IV  3.375 g Intravenous 3 times per day  . sodium chloride  3 mL Intravenous Q12H  . sodium chloride  3 mL Intravenous Q12H  . tamoxifen  20 mg Oral Daily  . vancomycin  500 mg Intravenous Q12H   Continuous Infusions:     Time spent: 35 minutes    Modena Jansky, MD, FACP, [facility]. [facility] Pager [phone]  If 7PM-7AM, please contact night-coverage [email or web address] Password [email or web address] 07/13/2013, 1:28 PM  07/13/2013, 1:28 PM  LOS: 4 days

## 2013-07-13 NOTE — Progress Notes (Signed)
UR complete.  Parv Manthey RN, MSN 

## 2013-07-13 NOTE — Care Management Note (Signed)
    Page 1 of 1   07/15/2013     3:35:01 PM CARE MANAGEMENT NOTE 07/15/2013  Patient:  Brianna Hunter, Brianna Hunter   Account Number:  [id number]  Date Initiated:  07/13/2013  Documentation initiated by:  Lorne Skeens  Subjective/Objective Assessment:   patient admitted with sepsis, rule out TB. Lives at home alone.     Action/Plan:   Will follow for discharge needs pending PT/OT evals and physician orders.   Anticipated DC Date:  07/15/2013   Anticipated DC Plan:  [name]  CM consult      Choice offered to / List presented to:  C-1 Patient        [name] arranged  [place] [facility]      [name].   Status of service:   Medicare Important Message given?  YES (If response is "NO", the following Medicare IM given date fields will be blank) Date Medicare IM given:   Date Additional Medicare IM given:  07/13/2013  Discharge Disposition:  [name]  Per UR Regulation:  Reviewed for med. necessity/level of care/duration of stay  If discussed at [place] of Stay Meetings, dates discussed:    Comments:  07/15/13 [place], MSN, CM- Met with patient to discuss home health needs.  Patient is agreeable to home health PT, and has chosen [facility].  Stephanie with [facility] was notified and has accepted the referral.

## 2013-07-13 NOTE — Progress Notes (Signed)
INFECTIOUS DISEASE PROGRESS NOTE  ID: Brianna Hunter is a 80 y.o. female with  Principal Problem:   Cavitary lesion of lung Active Problems:   HYPERTENSION, BENIGN   Sepsis   Leukocytosis   Protein-calorie malnutrition, moderate   Hemoptysis   CAP (community acquired pneumonia)   Acute respiratory failure with hypoxia  Subjective: Cough decreasing, no sputum today.   Abtx:  Anti-infectives   Start     Dose/Rate Route Frequency Ordered Stop   07/12/13 2200  vancomycin (VANCOCIN) 500 mg in sodium chloride 0.9 % 100 mL IVPB     500 mg 100 mL/hr over 60 Minutes Intravenous Every 12 hours 07/12/13 1149     07/12/13 1400  piperacillin-tazobactam (ZOSYN) IVPB 3.375 g     3.375 g 12.5 mL/hr over 240 Minutes Intravenous 3 times per day 07/12/13 1229     07/10/13 0900  vancomycin (VANCOCIN) IVPB 750 mg/150 ml premix  Status:  Discontinued     750 mg 150 mL/hr over 60 Minutes Intravenous Every 24 hours 07/09/13 0905 07/09/13 1856   07/10/13 0900  vancomycin (VANCOCIN) IVPB 750 mg/150 ml premix  Status:  Discontinued     750 mg 150 mL/hr over 60 Minutes Intravenous Every 24 hours 07/09/13 1901 07/12/13 1149   07/10/13 0830  ceFEPIme (MAXIPIME) 2 g in dextrose 5 % 50 mL IVPB  Status:  Discontinued     2 g 100 mL/hr over 30 Minutes Intravenous Every 24 hours 07/10/13 0820 07/12/13 1156   07/09/13 1930  cefTRIAXone (ROCEPHIN) 1 g in dextrose 5 % 50 mL IVPB  Status:  Discontinued     1 g 100 mL/hr over 30 Minutes Intravenous Every 24 hours 07/09/13 1856 07/10/13 0740   07/09/13 1930  azithromycin (ZITHROMAX) 500 mg in dextrose 5 % 250 mL IVPB  Status:  Discontinued     500 mg 250 mL/hr over 60 Minutes Intravenous Every 24 hours 07/09/13 1855 07/10/13 0740   07/09/13 1400  piperacillin-tazobactam (ZOSYN) IVPB 3.375 g  Status:  Discontinued     3.375 g 12.5 mL/hr over 240 Minutes Intravenous 3 times per day 07/09/13 0905 07/09/13 1856   07/09/13 0800  piperacillin-tazobactam (ZOSYN)  IVPB 3.375 g     3.375 g 100 mL/hr over 30 Minutes Intravenous  Once 07/09/13 0749 07/09/13 0920   07/09/13 0800  vancomycin (VANCOCIN) IVPB 1000 mg/200 mL premix     1,000 mg 200 mL/hr over 60 Minutes Intravenous  Once 07/09/13 0749 07/09/13 1006      Medications:  Scheduled: . albuterol  2.5 mg Nebulization BID  . aspirin EC  81 mg Oral Daily  . feeding supplement (RESOURCE BREEZE)  1 Container Oral BID BM  . latanoprost  1 drop Right Eye QHS  . piperacillin-tazobactam (ZOSYN)  IV  3.375 g Intravenous 3 times per day  . sodium chloride  3 mL Intravenous Q12H  . sodium chloride  3 mL Intravenous Q12H  . tamoxifen  20 mg Oral Daily  . vancomycin  500 mg Intravenous Q12H    Objective: Vital signs in last 24 hours: Temp:  [98.5 F (36.9 C)-99.2 F (37.3 C)] 99.2 F (37.3 C) (05/05 0520) Pulse Rate:  [81-89] 81 (05/05 0520) Resp:  [22-24] 22 (05/05 0520) BP: (137-139)/(59-62) 137/62 mmHg (05/05 0520) SpO2:  [91 %-100 %] 94 % (05/05 0904) Weight:  [58.6 kg (129 lb 3 oz)] 58.6 kg (129 lb 3 oz) (05/05 0500)   General appearance: alert, cooperative and no distress Resp: rhonchi  bilaterally Cardio: regular rate and rhythm GI: normal findings: bowel sounds normal and soft, non-tender  Lab Results  Recent Labs  07/12/13 0343 07/13/13 0540  WBC 19.5* 17.3*  HGB 9.2* 9.1*  HCT 27.9* 27.6*   Liver Panel No results found for this basename: PROT, ALBUMIN, AST, ALT, ALKPHOS, BILITOT, BILIDIR, IBILI,  in the last 72 hours Sedimentation Rate No results found for this basename: ESRSEDRATE,  in the last 72 hours C-Reactive Protein No results found for this basename: CRP,  in the last 72 hours  Microbiology: Recent Results (from the past 240 hour(s))  CULTURE, BLOOD (ROUTINE X 2)     Status: None   Collection Time    07/09/13  7:49 AM      Result Value Ref Range Status   Specimen Description BLOOD RIGHT ANTECUBITAL   Final   Special Requests BOTTLES DRAWN AEROBIC AND  ANAEROBIC 10MLS   Final   Culture  Setup Time     Final   Value: 07/09/2013 12:52     Performed at [facility]   Culture     Final   Value:        BLOOD CULTURE RECEIVED NO GROWTH TO DATE CULTURE WILL BE HELD FOR 5 DAYS BEFORE ISSUING A FINAL NEGATIVE REPORT     Performed at [facility]   Report Status PENDING   Incomplete  CULTURE, BLOOD (ROUTINE X 2)     Status: None   Collection Time    07/09/13  8:02 AM      Result Value Ref Range Status   Specimen Description BLOOD LEFT ANTECUBITAL   Final   Special Requests BOTTLES DRAWN AEROBIC AND ANAEROBIC 10CC   Final   Culture  Setup Time     Final   Value: 07/09/2013 12:52     Performed at [facility]   Culture     Final   Value:        BLOOD CULTURE RECEIVED NO GROWTH TO DATE CULTURE WILL BE HELD FOR 5 DAYS BEFORE ISSUING A FINAL NEGATIVE REPORT     Performed at [facility]   Report Status PENDING   Incomplete  URINE CULTURE     Status: None   Collection Time    07/09/13  9:15 AM      Result Value Ref Range Status   Specimen Description URINE, CATHETERIZED   Final   Special Requests NONE   Final   Culture  Setup Time     Final   Value: 07/09/2013 09:51     Performed at [facility]     Final   Value: NO GROWTH     Performed at [facility]   Culture     Final   Value: NO GROWTH     Performed at [facility]   Report Status 07/10/2013 FINAL   Final  MRSA PCR SCREENING     Status: None   Collection Time    07/09/13  7:00 PM      Result Value Ref Range Status   MRSA by PCR NEGATIVE  NEGATIVE Final   Comment:            The GeneXpert MRSA Assay (FDA     approved for NASAL specimens     only), is one component of a     comprehensive MRSA colonization     surveillance program. It is not     intended to diagnose  MRSA     infection nor to guide or     monitor treatment for     MRSA infections.  CULTURE, BLOOD (ROUTINE X 2)     Status: None   Collection  Time    07/09/13  8:25 PM      Result Value Ref Range Status   Specimen Description BLOOD ARM RIGHT   Final   Special Requests BOTTLES DRAWN AEROBIC ONLY 2CC   Final   Culture  Setup Time     Final   Value: 07/10/2013 02:04     Performed at [facility]   Culture     Final   Value:        BLOOD CULTURE RECEIVED NO GROWTH TO DATE CULTURE WILL BE HELD FOR 5 DAYS BEFORE ISSUING A FINAL NEGATIVE REPORT     Performed at [facility]   Report Status PENDING   Incomplete  CULTURE, BLOOD (ROUTINE X 2)     Status: None   Collection Time    07/09/13  8:31 PM      Result Value Ref Range Status   Specimen Description BLOOD HAND RIGHT   Final   Special Requests     Final   Value: BOTTLES DRAWN AEROBIC AND ANAEROBIC 10CCBLUE 6CCRED   Culture  Setup Time     Final   Value: 07/10/2013 02:05     Performed at [facility]   Culture     Final   Value:        BLOOD CULTURE RECEIVED NO GROWTH TO DATE CULTURE WILL BE HELD FOR 5 DAYS BEFORE ISSUING A FINAL NEGATIVE REPORT     Performed at [facility]   Report Status PENDING   Incomplete  CULTURE, RESPIRATORY (NON-EXPECTORATED)     Status: None   Collection Time    07/12/13  5:28 PM      Result Value Ref Range Status   Specimen Description TRACHEAL ASPIRATE   Final   Special Requests Normal   Final   Gram Stain     Final   Value: FEW WBC PRESENT,BOTH PMN AND MONONUCLEAR     RARE SQUAMOUS EPITHELIAL CELLS PRESENT     FEW GRAM POSITIVE COCCI IN PAIRS     Performed at [facility]   Culture     Final   Value: Culture reincubated for better growth     Performed at [facility]   Report Status PENDING   Incomplete    Studies/Results: No results found.   Assessment/Plan: Cavitary pneumonia Hx of TB exposure  Await her ABF sputum (needs #2 collected) Await her sputum Cx Await her quantiferon gold.  WBC improving on conventional anbx  Total days of antibiotics: 5 (vanco/zosyn)         [facility] (pager) [phone] [email or web address] 07/13/2013, 11:24 AM  LOS: 4 days   **Disclaimer: This note may have been dictated with voice recognition software. Similar sounding words can inadvertently be transcribed and this note may contain transcription errors which may not have been corrected upon publication of note.**

## 2013-07-13 NOTE — Progress Notes (Signed)
Occupational Therapy Evaluation Patient Details Name: Brianna Hunter MRN: [id number] DOB: [birth year] Today's Date: 07/13/2013    History of Present Illness 80 year old female with history of hypertension, breast cancer, anxiety, arthritis, history of valvular heart disease who was brought in by EMS for low back pain and fatigue. However she was tachycardic and complaining of cough and shortness of breath for an unclear duration. She also reports having hemoptysis for past few days. On admission she had significant leukocytosis, fever and tachycardia. Chest x-ray done showed cavitary mass over the right upper lobe which was followed up with a CT scan of the chest with contrast showing right upper lobe consolidation with central cavitation and patchy areas of infiltrate bilaterally with small right pleural effusion suggestive of necrotizing pneumonia. Admitted to step down for sepsis.   Clinical Impression   PTA pt lived at home alone and was independent with ADLs and functional mobility. Educated pt on fall prevention and concerns due to deconditioning from hospital stay. Explained purpose of SNF rehab for strengthening and increased independence. Pt reports her daughter may be able to stay with her for a few days, but concerned about pt's risk of falls. Pt is open to SNF depending on insurance coverage, per her report. Feel that pt would benefit from SNF rehab prior to return home. Pt would benefit from skilled OT to address independence with ADLs prior to d/c.     Follow Up Recommendations  SNF;Supervision/Assistance - 24 hour    Equipment Recommendations  Other (comment) (Defer to SNF)       Precautions / Restrictions Precautions Precautions: Fall Restrictions Weight Bearing Restrictions: No      Mobility Bed Mobility Overal bed mobility: Needs Assistance Bed Mobility: Supine to Sit     Supine to sit: Supervision;HOB elevated        Transfers Overall transfer level: Needs  assistance Equipment used: None Transfers: Sit to/from [facility] Sit to Stand: Min guard Stand pivot transfers: Min guard                 ADL Overall ADL's : Needs assistance/impaired Eating/Feeding: Independent;Sitting   Grooming: Min guard;Standing;Wash/dry hands;Wash/dry face   Upper Body Bathing: Set up;Supervision/ safety;Sitting   Lower Body Bathing: Min guard;Sit to/from stand   Upper Body Dressing : Set up;Supervision/safety;Sitting   Lower Body Dressing: Min guard;Sit to/from stand   Toilet Transfer: Min guard;Stand-pivot;BSC   Toileting- Water quality scientist and Hygiene: Min guard;Sit to/from stand   Tub/ Shower Transfer: Tub transfer;Moderate assistance;Ambulation   Functional mobility during ADLs: Min guard General ADL Comments: Pt performed stand-pivot transfer from bed>BSC with min guard and ambulated around room and bathroom with min guard. Concerned with pt's ability to return home and pt seems unclear regarding support at home. Educated pt on fall prevention strategies and safety with functional mobility.      Vision   Per pt report, no change from baseline.                    Perception Perception Perception Tested?: No   Praxis Praxis Praxis tested?: Within functional limits    Pertinent Vitals/Pain No c/o pain; pt grimaces when coughing.     Hand Dominance Right   Extremity/Trunk Assessment Upper Extremity Assessment Upper Extremity Assessment: Generalized weakness   Lower Extremity Assessment Lower Extremity Assessment: Generalized weakness   Cervical / Trunk Assessment Cervical / Trunk Assessment: Normal   Communication Communication Communication: No difficulties   Cognition Arousal/Alertness: Awake/alert Behavior During Therapy: [facility]  for tasks assessed/performed Overall Cognitive Status: Within Functional Limits for tasks assessed                                Home Living  Family/patient expects to be discharged to:: Private residence Living Arrangements: Alone (Reports her daughter may stay with her for a few days) Available Help at Discharge: Family;Friend(s);Other (Comment) (will check in on pt) Type of Home: Apartment Home Access: Level entry;Elevator     Home Layout: One level     Bathroom Shower/Tub: Tub/shower unit Shower/tub characteristics: Curtain       Home Equipment: Walker - 2 wheels   Additional Comments: Pt reports that she "hates" showers and will only take a bath.      Prior Functioning/Environment Level of Independence: Independent             OT Diagnosis: Generalized weakness;Acute pain   OT Problem List: Decreased strength;Decreased range of motion;Decreased activity tolerance;Impaired balance (sitting and/or standing);Decreased safety awareness;Decreased knowledge of use of DME or AE;Pain   OT Treatment/Interventions: Self-care/ADL training;Therapeutic exercise;Energy conservation;DME and/or AE instruction;Therapeutic activities;Patient/family education;Balance training    OT Goals(Current goals can be found in the care plan section) Acute Rehab OT Goals Patient Stated Goal: To go home OT Goal Formulation: With patient Time For Goal Achievement: 07/20/13 Potential to Achieve Goals: Good ADL Goals Pt Will Perform Grooming: with modified independence;standing Pt Will Transfer to Toilet: with modified independence;ambulating;bedside commode Pt Will Perform Toileting - Clothing Manipulation and hygiene: with modified independence;sit to/from stand Pt Will Perform Tub/Shower Transfer: Tub transfer;with modified independence;3 in 1;ambulating  OT Frequency: Min 2X/week   Barriers to D/C: Decreased caregiver support  Unclear how much support pt will have if she returns home. She reports her daughter "may stay with" her for a few days.          End of Session Equipment Utilized During Treatment: Gait belt Nurse  Communication: Other (comment) (pt in chair with alarm set)  Activity Tolerance: Patient tolerated treatment well Patient left: in chair;with call bell/phone within reach;with chair alarm set   Time: 1540-1617 OT Time Calculation (min): 37 min Charges:  OT General Charges $OT Visit: 1 Procedure OT Evaluation $Initial OT Evaluation Tier I: 1 Procedure OT Treatments $Self Care/Home Management : 23-37 mins  Juluis Rainier [phone] 07/13/2013, 4:35 PM

## 2013-07-13 NOTE — Progress Notes (Signed)
Physical Therapy Treatment Patient Details Name: Brianna Hunter MRN: [id number] DOB: 1928/05/13 Today's Date: 07/13/2013    History of Present Illness 80 year old female with history of hypertension, breast cancer, anxiety, arthritis, history of valvular heart disease who was brought in by EMS for low back pain and fatigue. However she was tachycardic and complaining of cough and shortness of breath for an unclear duration. She also reports having hemoptysis for past few days. On admission she had significant leukocytosis, fever and tachycardia. Chest x-ray done showed cavitary mass over the right upper lobe which was followed up with a CT scan of the chest with contrast showing right upper lobe consolidation with central cavitation and patchy areas of infiltrate bilaterally with small right pleural effusion suggestive of necrotizing pneumonia. Admitted to step down for sepsis. PT ordered 5/2    PT Comments    Session limited by frequent loose stools, unable to move from [facility] during treatment.  Pt able to perform AROM in available range for all extremities.  Follow Up Recommendations  SNF     Equipment Recommendations  None recommended by PT    Recommendations for Other Services       Precautions / Restrictions Precautions Precautions: Fall Restrictions Weight Bearing Restrictions: No    Mobility  Bed Mobility               General bed mobility comments: supervision  Transfers Overall transfer level: Needs assistance Equipment used: None   Sit to Stand: Min guard         General transfer comment: pt transfered to [facility] without device with min A, pt requires cues for safety with transfers  Ambulation/Gait                 Stairs            Wheelchair Mobility    Modified Rankin (Stroke Patients Only)       Balance                                    Cognition Arousal/Alertness: Awake/alert Behavior During Therapy: WFL for tasks  assessed/performed Overall Cognitive Status: Within Functional Limits for tasks assessed                      Exercises General Exercises - Upper Extremity Shoulder Flexion: AROM;Both;20 reps Shoulder ABduction: AROM;Both;20 reps Elbow Flexion: AROM;20 reps;Both General Exercises - Lower Extremity Ankle Circles/Pumps: AROM;20 reps;Both Long Arc Quad: AROM;20 reps;Both Hip Flexion/Marching: AROM;20 reps;Both    General Comments        Pertinent Vitals/Pain No c/o pain    Home Living                      Prior Function            PT Goals (current goals can now be found in the care plan section) Progress towards PT goals: Progressing toward goals    Frequency  Min 3X/week    PT Plan Current plan remains appropriate    Co-evaluation             End of Session   Activity Tolerance: Treatment limited secondary to medical complications (Comment) (pt with loose stools throughout session, unable to gait due to need to be on [facility] at all times) Patient left: in chair;with nursing/sitter in room;with call bell/phone within reach     Time: [phone]  PT Time Calculation (min): 24 min  Charges:  $Therapeutic Exercise: 8-22 mins $Therapeutic Activity: 8-22 mins                    G Codes:      Kennith Gain 23-Jul-2013, 10:37 AM

## 2013-07-14 LAB — CBC
HEMATOCRIT: 27.9 % — AB (ref 36.0–46.0)
HEMOGLOBIN: 9.1 g/dL — AB (ref 12.0–15.0)
MCH: 22.5 pg — ABNORMAL LOW (ref 26.0–34.0)
MCHC: 32.6 g/dL (ref 30.0–36.0)
MCV: 69.1 fL — ABNORMAL LOW (ref 78.0–100.0)
Platelets: 184 10*3/uL (ref 150–400)
RBC: 4.04 MIL/uL (ref 3.87–5.11)
RDW: 13.5 % (ref 11.5–15.5)
WBC: 17.6 10*3/uL — ABNORMAL HIGH (ref 4.0–10.5)

## 2013-07-14 LAB — BASIC METABOLIC PANEL
BUN: 15 mg/dL (ref 6–23)
CHLORIDE: 108 meq/L (ref 96–112)
CO2: 27 mEq/L (ref 19–32)
Calcium: 8.7 mg/dL (ref 8.4–10.5)
Creatinine, Ser: 0.65 mg/dL (ref 0.50–1.10)
GFR calc Af Amer: >90 mL/min (ref >90)
GFR, EST NON AFRICAN AMERICAN: 80 mL/min — AB (ref >90)
GLUCOSE: 101 mg/dL — AB (ref 70–99)
POTASSIUM: 3.6 meq/L — AB (ref 3.7–5.3)
SODIUM: 143 meq/L (ref 137–147)

## 2013-07-14 LAB — CULTURE, RESPIRATORY W GRAM STAIN: Special Requests: NORMAL

## 2013-07-14 LAB — VANCOMYCIN, TROUGH: VANCOMYCIN TR: 6.3 ug/mL — AB (ref 10.0–20.0)

## 2013-07-14 MED ORDER — VANCOMYCIN HCL IN DEXTROSE 1-5 GM/200ML-% IV SOLN
1000.0000 mg | Freq: Two times a day (BID) | INTRAVENOUS | Status: DC
  Administered 2013-07-14 – 2013-07-15 (×2): 1000 mg via INTRAVENOUS
  Filled 2013-07-14 (×4): qty 200

## 2013-07-14 NOTE — Progress Notes (Signed)
INFECTIOUS DISEASE PROGRESS NOTE  ID: Brianna Hunter is a 80 y.o. female with  Principal Problem:   Cavitary lesion of lung Active Problems:   HYPERTENSION, BENIGN   Sepsis   Leukocytosis   Protein-calorie malnutrition, moderate   Hemoptysis   CAP (community acquired pneumonia)   Acute respiratory failure with hypoxia  Subjective: Cont cough, some pleuritic pain  Abtx:  Anti-infectives   Start     Dose/Rate Route Frequency Ordered Stop   07/14/13 1800  vancomycin (VANCOCIN) IVPB 1000 mg/200 mL premix     1,000 mg 200 mL/hr over 60 Minutes Intravenous Every 12 hours 07/14/13 1136     07/12/13 2200  vancomycin (VANCOCIN) 500 mg in sodium chloride 0.9 % 100 mL IVPB     500 mg 100 mL/hr over 60 Minutes Intravenous Every 12 hours 07/12/13 1149 07/14/13 1200   07/12/13 1400  piperacillin-tazobactam (ZOSYN) IVPB 3.375 g     3.375 g 12.5 mL/hr over 240 Minutes Intravenous 3 times per day 07/12/13 1229     07/10/13 0900  vancomycin (VANCOCIN) IVPB 750 mg/150 ml premix  Status:  Discontinued     750 mg 150 mL/hr over 60 Minutes Intravenous Every 24 hours 07/09/13 0905 07/09/13 1856   07/10/13 0900  vancomycin (VANCOCIN) IVPB 750 mg/150 ml premix  Status:  Discontinued     750 mg 150 mL/hr over 60 Minutes Intravenous Every 24 hours 07/09/13 1901 07/12/13 1149   07/10/13 0830  ceFEPIme (MAXIPIME) 2 g in dextrose 5 % 50 mL IVPB  Status:  Discontinued     2 g 100 mL/hr over 30 Minutes Intravenous Every 24 hours 07/10/13 0820 07/12/13 1156   07/09/13 1930  cefTRIAXone (ROCEPHIN) 1 g in dextrose 5 % 50 mL IVPB  Status:  Discontinued     1 g 100 mL/hr over 30 Minutes Intravenous Every 24 hours 07/09/13 1856 07/10/13 0740   07/09/13 1930  azithromycin (ZITHROMAX) 500 mg in dextrose 5 % 250 mL IVPB  Status:  Discontinued     500 mg 250 mL/hr over 60 Minutes Intravenous Every 24 hours 07/09/13 1855 07/10/13 0740   07/09/13 1400  piperacillin-tazobactam (ZOSYN) IVPB 3.375 g  Status:   Discontinued     3.375 g 12.5 mL/hr over 240 Minutes Intravenous 3 times per day 07/09/13 0905 07/09/13 1856   07/09/13 0800  piperacillin-tazobactam (ZOSYN) IVPB 3.375 g     3.375 g 100 mL/hr over 30 Minutes Intravenous  Once 07/09/13 0749 07/09/13 0920   07/09/13 0800  vancomycin (VANCOCIN) IVPB 1000 mg/200 mL premix     1,000 mg 200 mL/hr over 60 Minutes Intravenous  Once 07/09/13 0749 07/09/13 1006      Medications:  Scheduled: . aspirin EC  81 mg Oral Daily  . feeding supplement (RESOURCE BREEZE)  1 Container Oral BID BM  . latanoprost  1 drop Right Eye QHS  . piperacillin-tazobactam (ZOSYN)  IV  3.375 g Intravenous 3 times per day  . sodium chloride  3 mL Intravenous Q12H  . sodium chloride  3 mL Intravenous Q12H  . tamoxifen  20 mg Oral Daily  . vancomycin  1,000 mg Intravenous Q12H    Objective: Vital signs in last 24 hours: Temp:  [98.1 F (36.7 C)-99.9 F (37.7 C)] 99 F (37.2 C) (05/06 1610) Pulse Rate:  [73-83] 76 (05/06 1145) Resp:  [16-20] 16 (05/06 0632) BP: (96-147)/(55-59) 96/58 mmHg (05/06 1145) SpO2:  [98 %-100 %] 100 % (05/06 9604) Weight:  [59.8 kg (131  lb 13.4 oz)] 59.8 kg (131 lb 13.4 oz) (05/06 0500)   General appearance: alert, cooperative and no distress Resp: rhonchi R > L Cardio: regular rate and rhythm GI: normal findings: bowel sounds normal and soft, non-tender  Lab Results  Recent Labs  07/13/13 0540 07/14/13 0524  WBC 17.3* 17.6*  HGB 9.1* 9.1*  HCT 27.6* 27.9*  NA  --  143  K  --  3.6*  CL  --  108  CO2  --  27  BUN  --  15  CREATININE  --  0.65   Liver Panel No results found for this basename: PROT, ALBUMIN, AST, ALT, ALKPHOS, BILITOT, BILIDIR, IBILI,  in the last 72 hours Sedimentation Rate No results found for this basename: ESRSEDRATE,  in the last 72 hours C-Reactive Protein No results found for this basename: CRP,  in the last 72 hours  Microbiology: Recent Results (from the past 240 hour(s))  CULTURE, BLOOD  (ROUTINE X 2)     Status: None   Collection Time    07/09/13  7:49 AM      Result Value Ref Range Status   Specimen Description BLOOD RIGHT ANTECUBITAL   Final   Special Requests BOTTLES DRAWN AEROBIC AND ANAEROBIC 10MLS   Final   Culture  Setup Time     Final   Value: 07/09/2013 12:52     Performed at [facility]   Culture     Final   Value:        BLOOD CULTURE RECEIVED NO GROWTH TO DATE CULTURE WILL BE HELD FOR 5 DAYS BEFORE ISSUING A FINAL NEGATIVE REPORT     Performed at [facility]   Report Status PENDING   Incomplete  CULTURE, BLOOD (ROUTINE X 2)     Status: None   Collection Time    07/09/13  8:02 AM      Result Value Ref Range Status   Specimen Description BLOOD LEFT ANTECUBITAL   Final   Special Requests BOTTLES DRAWN AEROBIC AND ANAEROBIC 10CC   Final   Culture  Setup Time     Final   Value: 07/09/2013 12:52     Performed at [facility]   Culture     Final   Value:        BLOOD CULTURE RECEIVED NO GROWTH TO DATE CULTURE WILL BE HELD FOR 5 DAYS BEFORE ISSUING A FINAL NEGATIVE REPORT     Performed at [facility]   Report Status PENDING   Incomplete  URINE CULTURE     Status: None   Collection Time    07/09/13  9:15 AM      Result Value Ref Range Status   Specimen Description URINE, CATHETERIZED   Final   Special Requests NONE   Final   Culture  Setup Time     Final   Value: 07/09/2013 09:51     Performed at [place]     Final   Value: NO GROWTH     Performed at [facility]   Culture     Final   Value: NO GROWTH     Performed at [facility]   Report Status 07/10/2013 FINAL   Final  MRSA PCR SCREENING     Status: None   Collection Time    07/09/13  7:00 PM      Result Value Ref Range Status   MRSA by PCR NEGATIVE  NEGATIVE Final  Comment:            The GeneXpert MRSA Assay (FDA     approved for NASAL specimens     only), is one component of a     comprehensive MRSA colonization      surveillance program. It is not     intended to diagnose MRSA     infection nor to guide or     monitor treatment for     MRSA infections.  CULTURE, BLOOD (ROUTINE X 2)     Status: None   Collection Time    07/09/13  8:25 PM      Result Value Ref Range Status   Specimen Description BLOOD ARM RIGHT   Final   Special Requests BOTTLES DRAWN AEROBIC ONLY 2CC   Final   Culture  Setup Time     Final   Value: 07/10/2013 02:04     Performed at [facility]   Culture     Final   Value:        BLOOD CULTURE RECEIVED NO GROWTH TO DATE CULTURE WILL BE HELD FOR 5 DAYS BEFORE ISSUING A FINAL NEGATIVE REPORT     Performed at [facility]   Report Status PENDING   Incomplete  CULTURE, BLOOD (ROUTINE X 2)     Status: None   Collection Time    07/09/13  8:31 PM      Result Value Ref Range Status   Specimen Description BLOOD HAND RIGHT   Final   Special Requests     Final   Value: BOTTLES DRAWN AEROBIC AND ANAEROBIC 10CCBLUE 6CCRED   Culture  Setup Time     Final   Value: 07/10/2013 02:05     Performed at [facility]   Culture     Final   Value:        BLOOD CULTURE RECEIVED NO GROWTH TO DATE CULTURE WILL BE HELD FOR 5 DAYS BEFORE ISSUING A FINAL NEGATIVE REPORT     Performed at [facility]   Report Status PENDING   Incomplete  AFB CULTURE WITH SMEAR     Status: None   Collection Time    07/12/13  3:51 PM      Result Value Ref Range Status   Specimen Description LUNG   Final   Special Requests Normal   Final   Acid Fast Smear     Final   Value: NO ACID FAST BACILLI SEEN     Performed at [facility]   Culture     Final   Value: CULTURE WILL BE EXAMINED FOR 6 WEEKS BEFORE ISSUING A FINAL REPORT     Performed at [facility]   Report Status PENDING   Incomplete  CULTURE, RESPIRATORY (NON-EXPECTORATED)     Status: None   Collection Time    07/12/13  5:28 PM      Result Value Ref Range Status   Specimen Description TRACHEAL ASPIRATE    Final   Special Requests Normal   Final   Gram Stain     Final   Value: FEW WBC PRESENT,BOTH PMN AND MONONUCLEAR     RARE SQUAMOUS EPITHELIAL CELLS PRESENT     FEW GRAM POSITIVE COCCI IN PAIRS     Performed at [facility]   Culture     Final   Value: Non-Pathogenic Oropharyngeal-type Flora Isolated.     Performed at [facility]   Report Status 07/14/2013 FINAL  Final    Studies/Results: No results found.   Assessment/Plan: Cavitary PNA Hx of TB exposure  So far her AFB sputum have been negative. 2 remain to be processed. Awiat her quantiferon.  If all of above negative, would have her d/c on augmentin to complete a total of 21 days. Agree with f/u CT chest in 4 weeks.  We would be glad to see her in ID clinic in f/u.  Total days of antibiotics: 6 (vanco/zosyn)         [facility] (pager) [phone] [email or web address] 07/14/2013, 1:45 PM  LOS: 5 days   **Disclaimer: This note may have been dictated with voice recognition software. Similar sounding words can inadvertently be transcribed and this note may contain transcription errors which may not have been corrected upon publication of note.**

## 2013-07-14 NOTE — Progress Notes (Signed)
[facility] PROGRESS NOTE  Brianna Hunter TGG:[id number] DOB: 09-12-1928 DOA: 07/09/2013 PCP: [name], MD   Brief narrative 80 year old female with history of hypertension, breast cancer, anxiety, arthritis, history of valvular heart disease who was brought in by EMS for low back pain and fatigue. However she was tachycardic and complaining of cough and shortness of breath for an unclear duration. She also reports having hemoptysis for past few days. On admission she had significant leukocytosis, fever and tachycardia. Chest x-ray done showed cavitary mass over the right upper lobe which was followed up with a CT scan of the chest with contrast showing right upper lobe consolidation with central cavitation and patchy areas of infiltrate bilaterally with small right pleural effusion suggestive of necrotizing pneumonia. Admitted to step down for sepsis.  Assessment/Plan: Sepsis/cavitatory lung lesion/necrotizing pneumonia/R/O TB - secondary to underlying necrotizing pneumonia.  -Patient was started on empiric rocephin and azithromycin on admission which was changed to IV vancomycin and cefepime.  - HIV antibody negative.  - Infectious disease consulted on 5/4 and recommended checking Quanitiferon, HIV, sputum AFB x3, moving patient to airborne isolation and changing cefepime to Zosyn. Patient has history of TB exposure and hence ruling out TB. -Will need followup CT chest in 4-6 weeks to ensure resolution of above findings.  -Blood cultures x4: Negative to date. Urine Legionella and streptococcal antigen: Negative -  Sputum culture, Gold Quantiferon are pending - As per ID followup, if sputum AFB smear x2 negative and Gold Quantiferon negative, she could be discharged on Augmentin to complete total 21 days course, followup CT chest in 4 weeks and outpatient followup in ID clinic.  Acute hypoxic respiratory failure Secondary to underlying PNA. Hypoxia has  resolved.  Hemoptysis Likely related to underlying necrotizing pneumonia. Now improved. Monitor with current antibiotic regimen. Workup as above. Seems to have resolved.  History of breast cancer Continue tamoxifen  Hypokalemia Replenished  Thrombocytopenia/Anemia/leukocytosis   thrombocytopenia resolved. Hemoglobin stable. Leukocytosis improving.  Nonsevere (moderate) malnutrition in the context of chronic illness Management per dietitian.  generalized weakness PT eval appreciated-recommend SNF-patient however declines-requested PT to reassess.  Dental caries - Outpatient dental followup for extraction.  Hypertension - Currently controlled off of home medications. Monitor-blood pressures continue to be controlled or soft off of medications    Code Status: Full code Family Communication: None at bedside . Discussed with daughter via phone on 5/4 Disposition Plan: Continue airborne isolation, regular medical bed. Possible DC 5/7 pending above results   Consultants:  Infectious disease  Procedures:  None  Antibiotics:  IV Rocephin and azithromycin (5/1-5/2)  IV Cefepime (5/2-5/4)   IV vancomycin 5/2>  IV Zosyn 5/4>  HPI/Subjective:  nonproductive cough and no hemoptysis. Denied chest pain this morning.  Objective: Filed Vitals:   07/14/13 1503  BP: 122/51  Pulse: 77  Temp: 99.3 F (37.4 C)  Resp: 18   oxygen saturation: 97%  Intake/Output Summary (Last 24 hours) at 07/14/13 1518 Last data filed at 07/14/13 1300  Gross per 24 hour  Intake   1510 ml  Output      0 ml  Net   1510 ml   Filed Weights   07/11/13 0407 07/13/13 0500 07/14/13 0500  Weight: 58.1 kg (128 lb 1.4 oz) 58.6 kg (129 lb 3 oz) 59.8 kg (131 lb 13.4 oz)    Exam:   General: Elderly thin built female sitting comfortably on chair.   Cardiovascular: Normal S1 and S2, no murmurs rub or gallop.   Respiratory:  crackles over right upper lung. Rest of lung fields clear to  auscultation. No increased work of breathing.   Abdomen: Soft, nontender, nondistended, bowel sounds present  CNS: AAO x3. No focal neurological deficits.  Extremities: Mildly edematous right forearm without acute findings-from IV infiltration-improved/resolved.  ENT: Multiple missing teeth and bilateral caries  Data Reviewed: Basic Metabolic Panel:  Recent Labs Lab 07/09/13 0735 07/10/13 0850 07/14/13 0524  NA 132* 142 143  K 3.4* 4.8 3.6*  CL 94* 105 108  CO2 22 21 27   GLUCOSE 243* 100* 101*  BUN 26* 15 15  CREATININE 0.96 0.73 0.65  CALCIUM 9.2 8.8 8.7  MG  --  1.7  --    Liver Function Tests:  Recent Labs Lab 07/09/13 0735 07/10/13 0850  AST 18 27  ALT 11 10  ALKPHOS 75 84  BILITOT 0.3 0.4  PROT 7.1 6.5  ALBUMIN 2.9* 2.3*   No results found for this basename: LIPASE, AMYLASE,  in the last 168 hours No results found for this basename: AMMONIA,  in the last 168 hours CBC:  Recent Labs Lab 07/09/13 0735 07/10/13 0850 07/11/13 0413 07/12/13 0343 07/13/13 0540 07/14/13 0524  WBC 26.4* 28.9* 24.3* 19.5* 17.3* 17.6*  NEUTROABS 24.6*  --   --   --   --   --   HGB 11.2* 10.4* 9.5* 9.2* 9.1* 9.1*  HCT 33.1* 30.9* 29.1* 27.9* 27.6* 27.9*  MCV 68.4* 69.0* 69.1* 69.1* 68.8* 69.1*  PLT PLATELET CLUMPS NOTED ON SMEAR, COUNT APPEARS DECREASED 121* 129* 142* 160 184   Cardiac Enzymes: No results found for this basename: CKTOTAL, CKMB, CKMBINDEX, TROPONINI,  in the last 168 hours BNP (last 3 results)  Recent Labs  07/09/13 0741  PROBNP 596.8*   CBG:  Recent Labs Lab 07/10/13 0840 07/11/13 0837 07/12/13 0807  GLUCAP 103* 98 101*    Recent Results (from the past 240 hour(s))  CULTURE, BLOOD (ROUTINE X 2)     Status: None   Collection Time    07/09/13  7:49 AM      Result Value Ref Range Status   Specimen Description BLOOD RIGHT ANTECUBITAL   Final   Special Requests BOTTLES DRAWN AEROBIC AND ANAEROBIC 10MLS   Final   Culture  Setup Time     Final    Value: 07/09/2013 12:52     Performed at [facility]   Culture     Final   Value:        BLOOD CULTURE RECEIVED NO GROWTH TO DATE CULTURE WILL BE HELD FOR 5 DAYS BEFORE ISSUING A FINAL NEGATIVE REPORT     Performed at [facility]   Report Status PENDING   Incomplete  CULTURE, BLOOD (ROUTINE X 2)     Status: None   Collection Time    07/09/13  8:02 AM      Result Value Ref Range Status   Specimen Description BLOOD LEFT ANTECUBITAL   Final   Special Requests BOTTLES DRAWN AEROBIC AND ANAEROBIC 10CC   Final   Culture  Setup Time     Final   Value: 07/09/2013 12:52     Performed at [facility]   Culture     Final   Value:        BLOOD CULTURE RECEIVED NO GROWTH TO DATE CULTURE WILL BE HELD FOR 5 DAYS BEFORE ISSUING A FINAL NEGATIVE REPORT     Performed at [facility]   Report Status PENDING  Incomplete  URINE CULTURE     Status: None   Collection Time    07/09/13  9:15 AM      Result Value Ref Range Status   Specimen Description URINE, CATHETERIZED   Final   Special Requests NONE   Final   Culture  Setup Time     Final   Value: 07/09/2013 09:51     Performed at [place]     Final   Value: NO GROWTH     Performed at [facility]   Culture     Final   Value: NO GROWTH     Performed at [facility]   Report Status 07/10/2013 FINAL   Final  MRSA PCR SCREENING     Status: None   Collection Time    07/09/13  7:00 PM      Result Value Ref Range Status   MRSA by PCR NEGATIVE  NEGATIVE Final   Comment:            The GeneXpert MRSA Assay (FDA     approved for NASAL specimens     only), is one component of a     comprehensive MRSA colonization     surveillance program. It is not     intended to diagnose MRSA     infection nor to guide or     monitor treatment for     MRSA infections.  CULTURE, BLOOD (ROUTINE X 2)     Status: None   Collection Time    07/09/13  8:25 PM      Result Value Ref Range  Status   Specimen Description BLOOD ARM RIGHT   Final   Special Requests BOTTLES DRAWN AEROBIC ONLY 2CC   Final   Culture  Setup Time     Final   Value: 07/10/2013 02:04     Performed at [facility]   Culture     Final   Value:        BLOOD CULTURE RECEIVED NO GROWTH TO DATE CULTURE WILL BE HELD FOR 5 DAYS BEFORE ISSUING A FINAL NEGATIVE REPORT     Performed at [facility]   Report Status PENDING   Incomplete  CULTURE, BLOOD (ROUTINE X 2)     Status: None   Collection Time    07/09/13  8:31 PM      Result Value Ref Range Status   Specimen Description BLOOD HAND RIGHT   Final   Special Requests     Final   Value: BOTTLES DRAWN AEROBIC AND ANAEROBIC 10CCBLUE 6CCRED   Culture  Setup Time     Final   Value: 07/10/2013 02:05     Performed at [facility]   Culture     Final   Value:        BLOOD CULTURE RECEIVED NO GROWTH TO DATE CULTURE WILL BE HELD FOR 5 DAYS BEFORE ISSUING A FINAL NEGATIVE REPORT     Performed at [facility]   Report Status PENDING   Incomplete  AFB CULTURE WITH SMEAR     Status: None   Collection Time    07/12/13  3:51 PM      Result Value Ref Range Status   Specimen Description LUNG   Final   Special Requests Normal   Final   Acid Fast Smear     Final   Value: NO ACID FAST BACILLI SEEN     Performed at  [facility]     Final   Value: CULTURE WILL BE EXAMINED FOR 6 WEEKS BEFORE ISSUING A FINAL REPORT     Performed at [facility]   Report Status PENDING   Incomplete  CULTURE, RESPIRATORY (NON-EXPECTORATED)     Status: None   Collection Time    07/12/13  5:28 PM      Result Value Ref Range Status   Specimen Description TRACHEAL ASPIRATE   Final   Special Requests Normal   Final   Gram Stain     Final   Value: FEW WBC PRESENT,BOTH PMN AND MONONUCLEAR     RARE SQUAMOUS EPITHELIAL CELLS PRESENT     FEW GRAM POSITIVE COCCI IN PAIRS     Performed at [facility]   Culture     Final   Value:  Non-Pathogenic Oropharyngeal-type Flora Isolated.     Performed at [facility]   Report Status 07/14/2013 FINAL   Final     Studies: No results found.  Scheduled Meds: . aspirin EC  81 mg Oral Daily  . feeding supplement (RESOURCE BREEZE)  1 Container Oral BID BM  . latanoprost  1 drop Right Eye QHS  . piperacillin-tazobactam (ZOSYN)  IV  3.375 g Intravenous 3 times per day  . sodium chloride  3 mL Intravenous Q12H  . sodium chloride  3 mL Intravenous Q12H  . tamoxifen  20 mg Oral Daily  . vancomycin  1,000 mg Intravenous Q12H   Continuous Infusions:     Time spent: 35 minutes    Modena Jansky, MD, FACP, [facility]. [facility] Pager [phone]  If 7PM-7AM, please contact night-coverage [email or web address] Password [email or web address] 07/14/2013, 3:18 PM  07/14/2013, 3:18 PM  LOS: 5 days

## 2013-07-14 NOTE — Progress Notes (Signed)
ANTIBIOTIC CONSULT NOTE  Pharmacy Consult for vancomycin, zosyn Indication: cavitary lung lesion, aspiration vs necrotizing pneumonia  Allergies  Allergen Reactions  . Milk-Related Compounds Other (See Comments)    Frequent bowel movements     Patient Measurements: Height: 4\' 9"  (144.8 cm) Weight: 131 lb 13.4 oz (59.8 kg) IBW/kg (Calculated) : 38.6 Adjusted Body Weight: NA  Vital Signs: Temp: 99 F (37.2 C) (05/06 0632) Temp src: Oral (05/06 0632) BP: 147/58 mmHg (05/06 0632) Pulse Rate: 73 (05/06 0632) Intake/Output from previous day: 05/05 0701 - 05/06 0700 In: 690 [P.O.:690] Out: -  Intake/Output from this shift: Total I/O In: 360 [P.O.:360] Out: -   Labs:  Recent Labs  07/12/13 0343 07/13/13 0540 07/14/13 0524  WBC 19.5* 17.3* 17.6*  HGB 9.2* 9.1* 9.1*  PLT 142* 160 184  CREATININE  --   --  0.65   Estimated Creatinine Clearance: 39.6 ml/min (by C-G formula based on Cr of 0.65).  Recent Labs  07/12/13 0824 07/14/13 0855  VANCOTROUGH <5.0* 6.3*    Assessment: 80 year old female on vancomycin and zosyn for cavitary lung lesion, suspicious for aspiration vs necrotizing pneumonia.  Patient's father had a history of TB, AFB and Quantiferon gold are in process.  Kidney's are healthy with SCr <1, CrCl~30-35, pt is clearing vancomycin well for her age.  Vancomycin trough from this morning is subtherapeutic at 6.3.  Blood cultures remain ngtd.  Vancomycin 5/1 >> Zosyn 5/4 >> Cefepime 5/2 >> 5/4  5/1 Ceftriaxone x1 5/1 Azithromycin x1  5/4 Vanc trough 5.4   Goal of Therapy:  Vancomycin trough level 15-20 mcg/ml  Plan:  -Increase vancomycin to 1 g IV q12h -Recommend checking vancomycin trough at steady state -Continue zosyn 3.375 g IV q8h -F/u AFB results  Hughes Better, PharmD, BCPS Clinical Pharmacist Pager: [phone] 07/14/2013 11:25 AM

## 2013-07-15 LAB — CULTURE, BLOOD (ROUTINE X 2)
CULTURE: NO GROWTH
Culture: NO GROWTH

## 2013-07-15 LAB — QUANTIFERON TB GOLD ASSAY (BLOOD)
Mitogen value: 0.1 IU/mL
Quantiferon Nil Value: 0.02 IU/mL
TB AG VALUE: 0.01 [IU]/mL
TB Antigen Minus Nil Value: 0 IU/mL

## 2013-07-15 MED ORDER — BOOST / RESOURCE BREEZE PO LIQD: 1.0000 | Freq: Two times a day (BID) | ORAL | Status: DC

## 2013-07-15 MED ORDER — AMOXICILLIN-POT CLAVULANATE 500-125 MG PO TABS: 1.0000 | ORAL_TABLET | Freq: Two times a day (BID) | ORAL | Status: DC

## 2013-07-15 NOTE — Discharge Summary (Signed)
Physician Discharge Summary  Evolette Lasswell [id number] DOB: 1928-11-18 DOA: 07/09/2013  PCP: [name], MD  Admit date: 07/09/2013 Discharge date: 07/15/2013  Time spent: Greater than 30 minutes  Recommendations for Outpatient Follow-up:  1. Dr. [name], PCP in 1 week. To be seen with repeat labs (CBC). Followup of final sputum AFB culture and Quantiferon Gold results- sent from hospital. Also followup final blood culture results that were sent on 07/09/13. 2. Dr. Bobby Rumpf, ID: MDs office will arrange for outpatient followup. 3. Home health PT. 4. Recommend repeating CT chest in 4 weeks to follow cavitatory lung lesion post antibiotic therapy.  5. Recommend outpatient dental consultation to evaluate and manage dental caries.   Discharge Diagnoses:  Principal Problem:   Cavitary lesion of lung Active Problems:   HYPERTENSION, BENIGN   Sepsis   Leukocytosis   Protein-calorie malnutrition, moderate   Hemoptysis   CAP (community acquired pneumonia)   Acute respiratory failure with hypoxia   Discharge Condition: Improved & Stable  Diet recommendation: Heart healthy diet.  Filed Weights   07/13/13 0500 07/14/13 0500 07/15/13 0500  Weight: 58.6 kg (129 lb 3 oz) 59.8 kg (131 lb 13.4 oz) 59.6 kg (131 lb 6.3 oz)    History of present illness:  80 year old female patient with history of hypertension, breast cancer, valvular heart disease, bifascicular block, back pain, HLD, remote TB exposure (father died from pulmonary TB complications), presented to the hospital with complaints of back pain, cough, dyspnea and hemoptysis. In the ED, she was  febrile (103.39F), tachycardic, had leukocytosis and chest x-ray showed cavitating mass in the right upper lob. CT chest confirmed necrotizing pneumonia.  Hospital Course:   1. Sepsis/cavitatory pneumonia/hemoptysis: Imaging studies were as below. Initially she was empirically started on IV Rocephin and azithromycin which was  subsequently changed to IV vancomycin and cefepime. HIV antibody negative. Given her presentation and prior TB exposure, concern was for TB. Infectious disease was consulted. She was transferred to negative pressure isolation room. Sputum AFB smear x2 negative (cultures pending) and Gold Quantiferon results pending and can be followed up as outpatient. As discussed with Dr. Johnnye Sima, ID, patient will be discharged on oral Augmentin adjusted to age and renal function and will complete total 21 days treatment. Dr. Johnnye Sima will arrange outpatient follow up at the infectious disease clinic. Patient will need followup CT chest in 4 weeks. Patient has clinically improved. Hemoptysis has resolved. 2. Acute hypoxic respiratory failure: Secondary to problem #1. Resolved. 3. History of breast cancer: Continue tamoxifen an outpatient followup. 4. Hypokalemia: Replaced 5. Thrombocytopenia/anemia/leukocytosis: Thrombocytopenia resolved. Anemia stable. Leukocytosis stable. Follow CBC in a week as outpatient. 6. Nonsevere (moderate) malnutrition in the context of chronic illness: Dietitian consulted and started nutritional supplements 7. Generalized weakness: Secondary to acute illness. Improved. Physical therapy has evaluated her and recommend home health PT. Discussed with patient's son today and he indicates that the family will be able to provide 24/7 supervision and assistance. 8. Dental caries: Recommend outpatient dental referral for extractions. 9. Hypertension: Antihypertensives were held in the hospital and patient's blood pressures continued to be in the normal range. Reassess during outpatient followup whether these can be safely resumed.   Consultations:  Infectious disease  Procedures:  None    Discharge Exam:  Complaints:  Minimal nonproductive cough. No chest pain or hemoptysis. Anxious to go home.  Filed Vitals:   07/14/13 1503 07/14/13 2140 07/15/13 0228 07/15/13 0500  BP: 122/51 140/70  138/54   Pulse: 77 86 72  Temp: 99.3 F (37.4 C) 97.8 F (36.6 C) 98.9 F (37.2 C)   TempSrc: Oral Oral Oral   Resp: 18 18 18    Height:      Weight:    59.6 kg (131 lb 6.3 oz)  SpO2: 97% 94% 100%     General exam: Pleasant and comfortable elderly female sitting comfortably on chair.  Respiratory system:  harsh breath sounds with a few crackles over right upper lung fields. No wheezing or rhonchi. Rest of lung fields clear to auscultation. No increased work of breathing. Cardiovascular system: S1 & S2 heard, RRR. No JVD, murmurs, gallops, clicks or pedal edema. Gastrointestinal system: Abdomen is nondistended, soft and nontender. Normal bowel sounds heard. Central nervous system: Alert and oriented. No focal neurological deficits. Extremities: Symmetric 5 x 5 power.  Discharge Instructions      Discharge Orders   Future Appointments Provider Department Dept Phone   08/30/2013 11:15 AM [facility] [phone]   08/30/2013 11:45 AM Minette Headland, NP [facility] [phone]   Future Orders Complete By Expires   Call MD for:  difficulty breathing, headache or visual disturbances  As directed    Call MD for:  severe uncontrolled pain  As directed    Call MD for:  temperature >100.4  As directed    Call MD for:  As directed    Diet - low sodium heart healthy  As directed    Increase activity slowly  As directed        Medication List    STOP taking these medications       TRIBENZOR 40-5-25 MG Tabs  Generic drug:  Olmesartan-Amlodipine-HCTZ      TAKE these medications       amoxicillin-clavulanate 500-125 MG per tablet  Commonly known as:  AUGMENTIN  Take 1 tablet (500 mg total) by mouth 2 (two) times daily.     aspirin 81 MG EC tablet  Take 81 mg by mouth daily.     celecoxib 200 MG capsule  Commonly known as:  CELEBREX  Take 200 mg by mouth at bedtime.     EAR WAX REMOVAL DROPS  6.5 % otic solution  Generic drug:  carbamide peroxide  Place 5 drops into both ears 2 (two) times daily as needed (for ears).     feeding supplement (RESOURCE BREEZE) Liqd  Take 1 Container by mouth 2 (two) times daily between meals.     ferrous gluconate 325 MG tablet  Commonly known as:  FERGON  Take 325 mg by mouth daily with breakfast.     LUMIGAN 0.01 % Soln  Generic drug:  bimatoprost  Place 1 drop into the right eye at bedtime.     multivitamin tablet  Take 1 tablet by mouth daily.     SYSTANE 0.4-0.3 % Soln  Generic drug:  Polyethyl Glycol-Propyl Glycol  Apply 1 drop to eye as directed.     tamoxifen 20 MG tablet  Commonly known as:  NOLVADEX  Take 1 tablet (20 mg total) by mouth daily.     Vitamin A & D 10000-400 UNITS Caps  Take 1 capsule by mouth daily.       Follow-up Information   Follow up with [name], MD. Schedule an appointment as soon as possible for a visit in 1 week. (to be seen with repeat labs (CBC).)    Specialty:  Family Medicine   Contact information:  [address] [place] [address] [phone]       Follow up with Bobby Rumpf, MD. (MD's office will call with appointment.)    Specialty:  Infectious Diseases   Contact information:   [id number]  [address] [place]  [address] [phone]        The results of significant diagnostics from this hospitalization (including imaging, microbiology, ancillary and laboratory) are listed below for reference.    Significant Diagnostic Studies: Ct Chest W Contrast  07/09/2013   CLINICAL DATA:  Cough. Abnormal chest x-ray with cavitary lung lesion.  EXAM: CT CHEST WITH CONTRAST  TECHNIQUE: Multidetector CT imaging of the chest was performed during intravenous contrast administration.  CONTRAST:  15mL OMNIPAQUE IOHEXOL 300 MG/ML  SOLN  COMPARISON:  DG CHEST 1V PORT dated 07/09/2013; CT ANGIO CHEST W/CM &/OR WO/CM dated 06/02/2012  FINDINGS: As seen on  previous checks x-ray, there ED is wedge-shaped consolidation in the right upper lung with central cavitation. This is new since the prior study. This likely represents consolidation due to necrotizing pneumonia. Underlying obstructing mass lesion is not excluded and followup after resolution of acute process is recommended. There is a small right pleural effusion with patchy areas of infiltration in the right lung base. Patchy areas of infiltration also demonstrated in the posterior left upper lung. Mild prominence of right hilar and mediastinal lymph nodes, likely to be reactive. Esophagus is mostly decompressed. Calcification of the thoracic aorta. Normal caliber without dissection. Heart size is normal. No pneumothorax. Scattered calcified granulomas in the lungs. Visualized portions of the upper abdominal organs are grossly unremarkable. Degenerative changes throughout the thoracic spine. No destructive bone lesions appreciated.  IMPRESSION: Posterior right upper lung consolidation with central cavitation, patchy areas of infiltrate bilaterally, and small right pleural effusion. Changes likely to represent necrotizing pneumonia. Obstructing mass lesion is not excluded and followup after resolution of acute process is recommended.   Electronically Signed   By: Lucienne Capers M.D.   On: 07/09/2013 21:55   Dg Chest Port 1 View  07/09/2013   CLINICAL DATA:  Chest and back pain, shortness of breath, history hypertension, breast cancer, hyperlipidemia, heart murmur  EXAM: PORTABLE CHEST - 1 VIEW  COMPARISON:  Portable exam 0742 hr compared to radiographs and CT of 06/02/2012  FINDINGS: Enlargement of cardiac silhouette.  Atherosclerotic calcification aorta.  Mediastinal contours and pulmonary vascularity normal.  Cavitary the opacity/mass RIGHT upper lobe 5.3 x 5.0 cm in size.  This could represent a cavitary neoplasm or a lung abscess.  Remaining lungs clear.  Mild elevation of RIGHT diaphragm.  No pleural  effusion or pneumothorax.  BILATERAL chronic rotator cuff tears with degenerative changes RIGHT glenohumeral joint.  Osseous demineralization.  IMPRESSION: Cavitary mass RIGHT upper lobe 5.3 x 5.0 cm in size question cavitary neoplasm or lung abscess.  Further evaluation by CT chest with IV contrast recommended.   Electronically Signed   By: Lavonia Dana M.D.   On: 07/09/2013 07:56    Microbiology: Recent Results (from the past 240 hour(s))  CULTURE, BLOOD (ROUTINE X 2)     Status: None   Collection Time    07/09/13  7:49 AM      Result Value Ref Range Status   Specimen Description BLOOD RIGHT ANTECUBITAL   Final   Special Requests BOTTLES DRAWN AEROBIC AND ANAEROBIC 10MLS   Final   Culture  Setup Time     Final   Value:  07/09/2013 12:52     Performed at [facility]   Culture     Final   Value: NO GROWTH 5 DAYS     Performed at [facility]   Report Status 07/15/2013 FINAL   Final  CULTURE, BLOOD (ROUTINE X 2)     Status: None   Collection Time    07/09/13  8:02 AM      Result Value Ref Range Status   Specimen Description BLOOD LEFT ANTECUBITAL   Final   Special Requests BOTTLES DRAWN AEROBIC AND ANAEROBIC 10CC   Final   Culture  Setup Time     Final   Value: 07/09/2013 12:52     Performed at [facility]   Culture     Final   Value: NO GROWTH 5 DAYS     Performed at [facility]   Report Status 07/15/2013 FINAL   Final  URINE CULTURE     Status: None   Collection Time    07/09/13  9:15 AM      Result Value Ref Range Status   Specimen Description URINE, CATHETERIZED   Final   Special Requests NONE   Final   Culture  Setup Time     Final   Value: 07/09/2013 09:51     Performed at [place]     Final   Value: NO GROWTH     Performed at [facility]   Culture     Final   Value: NO GROWTH     Performed at [facility]   Report Status 07/10/2013 FINAL   Final  MRSA PCR SCREENING     Status: None    Collection Time    07/09/13  7:00 PM      Result Value Ref Range Status   MRSA by PCR NEGATIVE  NEGATIVE Final   Comment:            The GeneXpert MRSA Assay (FDA     approved for NASAL specimens     only), is one component of a     comprehensive MRSA colonization     surveillance program. It is not     intended to diagnose MRSA     infection nor to guide or     monitor treatment for     MRSA infections.  CULTURE, BLOOD (ROUTINE X 2)     Status: None   Collection Time    07/09/13  8:25 PM      Result Value Ref Range Status   Specimen Description BLOOD ARM RIGHT   Final   Special Requests BOTTLES DRAWN AEROBIC ONLY 2CC   Final   Culture  Setup Time     Final   Value: 07/10/2013 02:04     Performed at [facility]   Culture     Final   Value:        BLOOD CULTURE RECEIVED NO GROWTH TO DATE CULTURE WILL BE HELD FOR 5 DAYS BEFORE ISSUING A FINAL NEGATIVE REPORT     Performed at [facility]   Report Status PENDING   Incomplete  CULTURE, BLOOD (ROUTINE X 2)     Status: None   Collection Time    07/09/13  8:31 PM      Result Value Ref Range Status   Specimen Description BLOOD HAND RIGHT   Final   Special Requests     Final   Value: BOTTLES DRAWN  AEROBIC AND ANAEROBIC 10CCBLUE 6CCRED   Culture  Setup Time     Final   Value: 07/10/2013 02:05     Performed at [facility]   Culture     Final   Value:        BLOOD CULTURE RECEIVED NO GROWTH TO DATE CULTURE WILL BE HELD FOR 5 DAYS BEFORE ISSUING A FINAL NEGATIVE REPORT     Performed at [facility]   Report Status PENDING   Incomplete  AFB CULTURE WITH SMEAR     Status: None   Collection Time    07/12/13  3:51 PM      Result Value Ref Range Status   Specimen Description LUNG   Final   Special Requests Normal   Final   Acid Fast Smear     Final   Value: NO ACID FAST BACILLI SEEN     Performed at [facility]   Culture     Final   Value: CULTURE WILL BE EXAMINED FOR 6 WEEKS BEFORE ISSUING  A FINAL REPORT     Performed at [facility]   Report Status PENDING   Incomplete  CULTURE, RESPIRATORY (NON-EXPECTORATED)     Status: None   Collection Time    07/12/13  5:28 PM      Result Value Ref Range Status   Specimen Description TRACHEAL ASPIRATE   Final   Special Requests Normal   Final   Gram Stain     Final   Value: FEW WBC PRESENT,BOTH PMN AND MONONUCLEAR     RARE SQUAMOUS EPITHELIAL CELLS PRESENT     FEW GRAM POSITIVE COCCI IN PAIRS     Performed at [facility]   Culture     Final   Value: Non-Pathogenic Oropharyngeal-type Flora Isolated.     Performed at [facility]   Report Status 07/14/2013 FINAL   Final  AFB CULTURE WITH SMEAR     Status: None   Collection Time    07/13/13  5:00 AM      Result Value Ref Range Status   Specimen Description SPUTUM   Final   Special Requests NONE   Final   Acid Fast Smear     Final   Value: NO ACID FAST BACILLI SEEN     Performed at [facility]   Culture     Final   Value: CULTURE WILL BE EXAMINED FOR 6 WEEKS BEFORE ISSUING A FINAL REPORT     Performed at [facility]   Report Status PENDING   Incomplete     Labs: Basic Metabolic Panel:  Recent Labs Lab 07/09/13 0735 07/10/13 0850 07/14/13 0524  NA 132* 142 143  K 3.4* 4.8 3.6*  CL 94* 105 108  CO2 22 21 27   GLUCOSE 243* 100* 101*  BUN 26* 15 15  CREATININE 0.96 0.73 0.65  CALCIUM 9.2 8.8 8.7  MG  --  1.7  --    Liver Function Tests:  Recent Labs Lab 07/09/13 0735 07/10/13 0850  AST 18 27  ALT 11 10  ALKPHOS 75 84  BILITOT 0.3 0.4  PROT 7.1 6.5  ALBUMIN 2.9* 2.3*   No results found for this basename: LIPASE, AMYLASE,  in the last 168 hours No results found for this basename: AMMONIA,  in the last 168 hours CBC:  Recent Labs Lab 07/09/13 0735 07/10/13 0850 07/11/13 0413 07/12/13 0343 07/13/13 0540 07/14/13 0524  WBC 26.4* 28.9* 24.3* 19.5* 17.3*  17.6*  NEUTROABS 24.6*  --   --   --   --   --   HGB  11.2* 10.4* 9.5* 9.2* 9.1* 9.1*  HCT 33.1* 30.9* 29.1* 27.9* 27.6* 27.9*  MCV 68.4* 69.0* 69.1* 69.1* 68.8* 69.1*  PLT PLATELET CLUMPS NOTED ON SMEAR, COUNT APPEARS DECREASED 121* 129* 142* 160 184   Cardiac Enzymes: No results found for this basename: CKTOTAL, CKMB, CKMBINDEX, TROPONINI,  in the last 168 hours BNP: BNP (last 3 results)  Recent Labs  07/09/13 0741  PROBNP 596.8*   CBG:  Recent Labs Lab 07/10/13 0840 07/11/13 0837 07/12/13 0807  GLUCAP 103* 98 101*    Additional labs: 1. ABG on admission: PH 7.376, PCO2 33, PO2 78, bicarbonate 19 and oxygen saturation 95%. 2. TSH: 0.744 3. HIV antibody: Nonreactive 4. Urine Legionella and streptococcal antigen: Negative   Signed:  Modena Jansky, MD, FACP, FHM. [facility] Pager [phone]  If 7PM-7AM, please contact night-coverage [email or web address] Password [email or web address] 07/15/2013, 1:56 PM

## 2013-07-15 NOTE — Progress Notes (Signed)
Physical Therapy Treatment Patient Details Name: Brianna Hunter MRN: [id number] DOB: 1928-09-03 Today's Date: 07/15/2013    History of Present Illness 80 year old female with history of hypertension, breast cancer, anxiety, arthritis, history of valvular heart disease who was brought in by EMS for low back pain and fatigue. However she was tachycardic and complaining of cough and shortness of breath for an unclear duration. She also reports having hemoptysis for past few days. On admission she had significant leukocytosis, fever and tachycardia. Chest x-ray done showed cavitary mass over the right upper lobe which was followed up with a CT scan of the chest with contrast showing right upper lobe consolidation with central cavitation and patchy areas of infiltrate bilaterally with small right pleural effusion suggestive of necrotizing pneumonia. Admitted to step down for sepsis.    PT Comments    Pt ambulating room at min guard to supervision for safety. Requesting to ambulate without AD during session. Requesting to stay within room for treatment session. D/C disposition updated; pt reports daughter can be with her 24/7 upon acute D/C. Will cont to recommend HHPT for safety evaluation.   Follow Up Recommendations  Home health PT;Supervision/Assistance - 24 hour     Equipment Recommendations  None recommended by PT    Recommendations for Other Services       Precautions / Restrictions Precautions Precautions: Fall Restrictions Weight Bearing Restrictions: No    Mobility  Bed Mobility Overal bed mobility: Modified Independent Bed Mobility: Rolling;Supine to Sit Rolling: Modified independent (Device/Increase time)   Supine to sit: Modified independent (Device/Increase time)     General bed mobility comments: incr time due to weakness and relies on handrails and HOB elevated  Transfers Overall transfer level: Needs assistance Equipment used: 1 person hand held  assist Transfers: Sit to/from Stand Sit to Stand: Min guard         General transfer comment: min guard for initial sit to stand; supervision for cues to control descent when returning to chair; cues for safety and sequencing; slight sway with initail sit to stand  Ambulation/Gait Ambulation/Gait assistance: Min guard Ambulation Distance (Feet): 60 Feet (laps within room ) Assistive device: None Gait Pattern/deviations: Step-through pattern;Narrow base of support Gait velocity: decreased; guarded Gait velocity interpretation: Below normal speed for age/gender General Gait Details: pt requesting to ambulate without RW this session; ambulates with decreased arm swing; min guard for balance; pt slightly unsteady with gt when negotiating around obstacles; cues for safety    Stairs            Wheelchair Mobility    Modified Rankin (Stroke Patients Only)       Balance Overall balance assessment: Needs assistance Sitting-balance support: Feet supported;No upper extremity supported Sitting balance-Leahy Scale: Good     Standing balance support: During functional activity;No upper extremity supported Standing balance-Leahy Scale: Fair Standing balance comment: slight sway; no (A) needed for static standing; able to accept minimal weightshifting                    Cognition Arousal/Alertness: Awake/alert Behavior During Therapy: WFL for tasks assessed/performed Overall Cognitive Status: Within Functional Limits for tasks assessed                      Exercises General Exercises - Lower Extremity Ankle Circles/Pumps: AROM;Both;15 reps;Supine Long Arc Quad: AROM;Strengthening;Both;10 reps;Seated Hip ABduction/ADduction: AROM;Both;Strengthening;10 reps;Seated Hip Flexion/Marching: AROM;Both;10 reps;Standing    General Comments        Pertinent Vitals/Pain No  c/o pain today. Reports "i actually feel better"    Home Living                       Prior Function            PT Goals (current goals can now be found in the care plan section) Acute Rehab PT Goals Patient Stated Goal: im going home with my doctor  PT Goal Formulation: With patient Time For Goal Achievement: 07/19/13 Potential to Achieve Goals: Good Progress towards PT goals: Progressing toward goals;Goals met and updated - see care plan    Frequency  Min 3X/week    PT Plan Discharge plan needs to be updated    Co-evaluation             End of Session Equipment Utilized During Treatment: Gait belt Activity Tolerance: Patient tolerated treatment well Patient left: in chair;with call bell/phone within reach;with chair alarm set     Time: 7342-8768 PT Time Calculation (min): 24 min  Charges:  $Gait Training: 8-22 mins $Therapeutic Exercise: 8-22 mins                    G CodesKennis Carina [place],  [place]  [phone] 07/15/2013, 2:48 PM

## 2013-07-16 LAB — CULTURE, BLOOD (ROUTINE X 2)
Culture: NO GROWTH
Culture: NO GROWTH

## 2013-08-09 ENCOUNTER — Ambulatory Visit (INDEPENDENT_AMBULATORY_CARE_PROVIDER_SITE_OTHER): Payer: Commercial Managed Care - HMO | Admitting: Infectious Diseases

## 2013-08-09 ENCOUNTER — Encounter: Payer: Self-pay | Admitting: Infectious Diseases

## 2013-08-09 VITALS — BP 154/74 | HR 123 | Temp 98.3°F | Ht <= 58 in | Wt 111.0 lb

## 2013-08-09 DIAGNOSIS — J984 Other disorders of lung: Secondary | ICD-10-CM | POA: Diagnosis not present

## 2013-08-09 DIAGNOSIS — K089 Disorder of teeth and supporting structures, unspecified: Secondary | ICD-10-CM

## 2013-08-09 NOTE — Assessment & Plan Note (Signed)
Will try to have her eval by dental.

## 2013-08-09 NOTE — Assessment & Plan Note (Addendum)
Still wide ddx- TB, Ca, aspiration, anaerobic/atypical infection from poor dentition (nocardia, actino).  She appears to be doing well. Some concern about her loss of wt. Will repeat her CT chest. If continued changes, will have her seen by Pulm to consider BAL. rtc 2-3 weeks

## 2013-08-09 NOTE — Progress Notes (Signed)
   Subjective:    Patient ID: Brianna Hunter, female    DOB: 1928-05-12, 80 y.o.   MRN: [id number]  HPI 80 yo F with hx of HTN, Breast CA (2013), brought to [facility] on 5[facility] with back pain, found to be tachycardic, febrile, WBC of 26.4 and to have a cavitary pneumonia. This was further eval with CT showing necrotizing pneumonia. Upon further questioning, states that her dad had Tb. She has never been tested for TB.  She had sputum cytology (-), sputum AFB (-) x3. She was d/c home on 5-7 and completed 21 days of anbx (augmentin). She had an indeterminant quantiferon in the hospital.  States she is having trouble gaining weight. She was down to 117# in hospital. Now is down to 111#. Had some feet swelling after d/c.  Has gotten cough rx from her PCP.  Feels like her breathing is fine- no DOE. Occas cough. Rare hemoptysis (lhad more when she was admitted to hospital).   Review of Systems  Constitutional: Positive for unexpected weight change. Negative for fever and chills.  Respiratory: Positive for cough. Negative for shortness of breath.   Cardiovascular: Positive for leg swelling.  Gastrointestinal: Negative for diarrhea and constipation.  Genitourinary: Negative for difficulty urinating.       Objective:   Physical Exam  Constitutional: She appears well-developed and well-nourished.  HENT:  Mouth/Throat: No oropharyngeal exudate.    Eyes: EOM are normal. Pupils are equal, round, and reactive to light.  Neck: Neck supple.  Cardiovascular: Normal rate, regular rhythm and normal heart sounds.   Pulmonary/Chest: Effort normal and breath sounds normal.  Abdominal: Soft. Bowel sounds are normal. She exhibits no distension. There is no tenderness.  Lymphadenopathy:    She has no cervical adenopathy.          Assessment & Plan:

## 2013-08-09 NOTE — Progress Notes (Signed)
   Subjective:    Patient ID: Brianna Hunter, female    DOB: 05-Mar-1929, 80 y.o.   MRN: [id number]  HPI    Review of Systems     Objective:   Physical Exam  Eyes:  L eye is cloudy.           Assessment & Plan:

## 2013-08-13 ENCOUNTER — Other Ambulatory Visit: Payer: Self-pay | Admitting: Infectious Diseases

## 2013-08-13 ENCOUNTER — Other Ambulatory Visit: Payer: Self-pay | Admitting: Internal Medicine

## 2013-08-13 ENCOUNTER — Telehealth: Payer: Self-pay | Admitting: *Deleted

## 2013-08-13 DIAGNOSIS — I10 Essential (primary) hypertension: Secondary | ICD-10-CM

## 2013-08-13 NOTE — Telephone Encounter (Signed)
Called patient's daughter, May and notified her of patient's CT appt at [facility] for 08/19/13 at 3:00 pm. Patient needs to arrive at 2:00 pm for a BMP lab prior to the test. Patient has [employer] insurance so [place] at Dr. Sheilah Mins office initiated the prior authorization for this test [phone]. Office note faxed to Dr. Fransico Setters office at [phone]. Myrtis Hopping

## 2013-08-19 ENCOUNTER — Ambulatory Visit (HOSPITAL_COMMUNITY)
Admission: RE | Admit: 2013-08-19 | Discharge: 2013-08-19 | Disposition: A | Discharge status: Home / Self Care | Payer: Medicare HMO | Source: Ambulatory Visit | Attending: Infectious Diseases | Admitting: Infectious Diseases

## 2013-08-19 ENCOUNTER — Encounter (HOSPITAL_COMMUNITY): Payer: Self-pay

## 2013-08-19 ENCOUNTER — Telehealth: Payer: Self-pay | Admitting: Adult Health

## 2013-08-19 ENCOUNTER — Other Ambulatory Visit: Payer: Self-pay | Admitting: Lab

## 2013-08-19 ENCOUNTER — Ambulatory Visit: Payer: Self-pay | Admitting: Adult Health

## 2013-08-19 DIAGNOSIS — R05 Cough: Secondary | ICD-10-CM | POA: Insufficient documentation

## 2013-08-19 DIAGNOSIS — R059 Cough, unspecified: Secondary | ICD-10-CM | POA: Insufficient documentation

## 2013-08-19 DIAGNOSIS — J984 Other disorders of lung: Secondary | ICD-10-CM

## 2013-08-19 MED ORDER — IOHEXOL 300 MG/ML  SOLN
70.0000 mL | Freq: Once | INTRAMUSCULAR | Status: AC | PRN
  Administered 2013-08-19: 70 mL via INTRAVENOUS

## 2013-08-19 NOTE — Telephone Encounter (Signed)
S/w pt advised LC has asked that 6/22 appt be moved out approx 1 month. Gave pt new appt for 10/11/13 @ 11.15. Pt verbalized understanding and says she didn't mind moving the appt becasue she has "soo many other appts" Mailed calendar for Aug.

## 2013-08-24 LAB — AFB CULTURE WITH SMEAR (NOT AT ARMC)
Acid Fast Smear: NONE SEEN
SPECIAL REQUESTS: NORMAL

## 2013-08-25 ENCOUNTER — Ambulatory Visit (INDEPENDENT_AMBULATORY_CARE_PROVIDER_SITE_OTHER): Payer: Medicare HMO | Admitting: Infectious Diseases

## 2013-08-25 ENCOUNTER — Encounter: Payer: Self-pay | Admitting: Infectious Diseases

## 2013-08-25 VITALS — BP 149/67 | HR 87 | Temp 97.9°F | Wt 110.0 lb

## 2013-08-25 DIAGNOSIS — E440 Moderate protein-calorie malnutrition: Secondary | ICD-10-CM

## 2013-08-25 DIAGNOSIS — J984 Other disorders of lung: Secondary | ICD-10-CM

## 2013-08-25 LAB — AFB CULTURE WITH SMEAR (NOT AT ARMC): ACID FAST SMEAR: NONE SEEN

## 2013-08-25 NOTE — Assessment & Plan Note (Signed)
Her lesion is imporving by her CT (although the size is listed on her original CT). Will have her eval by pulmonary. Otherwise consider f/u in 4 months with repeat CT then.

## 2013-08-25 NOTE — Progress Notes (Signed)
   Subjective:    Patient ID: Brianna Hunter, female    DOB: Feb 10, 1929, 80 y.o.   MRN: [id number]  HPI 80 yo F with hx of HTN, Breast CA (2013), brought to [facility] on 5-1 with back pain, found to be tachycardic, febrile, WBC of 26.4 and to have a cavitary pneumonia. This was further eval with CT showing necrotizing pneumonia. Upon further questioning, states that her dad had Tb. She has never been tested for TB.  She had sputum cytology (-), sputum AFB (-) x3. She was d/c home on 5-7 and completed 21 days of anbx (augmentin).  She had an indeterminant quantiferon in the hospital.  States she is having trouble with weight loss. She was down to 117# in hospital. Now is down to 110 (down 1# since last visit).  She had repeat CT on 6-11: 1. Posterior right upper lobe cavitary lesion today measures 67 mm x 38 mm.  2. Resolution of right pleural effusion and decreased airspace  disease around the cavitary lesion suggests response the treatment.  Persistent fluid level within the cavitary portion.  3. Borderline right hilar adenopathy is probably reactive.  4. Continued follow-up to ensure resolution and expected evolution  to scarring is recommended.  Has no trouble with her breathing. Feels like it has improved. Has been taking delsym for cough. No sputum production. No fever or chills. Has been walking for exercise. Has been 3 meals a day, trying to overeat. Has not noticed any lymphadenopathy. Has had edema in feet.  Lives in [place] [address].   Review of Systems     Objective:   Physical Exam  Constitutional: She appears well-developed and well-nourished.  HENT:  Mouth/Throat: No oropharyngeal exudate.  Eyes:  L eye cloudy  Neck: Neck supple.  Cardiovascular: Normal rate, regular rhythm and normal heart sounds.   Pulmonary/Chest: Effort normal and breath sounds normal.  Abdominal: Soft. Bowel sounds are normal. She exhibits no distension. There is no tenderness.    Musculoskeletal:  Trace LE edema, non-pitting.   Lymphadenopathy:    She has no cervical adenopathy.    She has no axillary adenopathy.       Right: No supraclavicular adenopathy present.       Left: No supraclavicular adenopathy present.          Assessment & Plan:

## 2013-08-25 NOTE — Assessment & Plan Note (Addendum)
Needs protein supplementation. She states she can't take milk or supplements. Will have her seen by nutrition.

## 2013-08-26 ENCOUNTER — Telehealth: Payer: Self-pay | Admitting: Nutrition

## 2013-08-26 ENCOUNTER — Telehealth: Payer: Self-pay | Admitting: *Deleted

## 2013-08-26 LAB — AFB CULTURE WITH SMEAR (NOT AT ARMC): ACID FAST SMEAR: NONE SEEN

## 2013-08-26 NOTE — Telephone Encounter (Signed)
Notified patient's daughter, Brianna Hunter, of appointment at [facility] for 09/21/13 at 4:00 pm. [place], [place]

## 2013-08-26 NOTE — Telephone Encounter (Signed)
Received call from [facility] at Dr. Algis Downs office requesting nutrition appointment for patient due to "protein calorie malnutrition".  I contacted patient's daughter to attempt to make appointment but she did not answer.  Left a message for her to call me to schedule a nutrition appointment.

## 2013-08-26 NOTE — Addendum Note (Signed)
Addended by: Myrtis Hopping A on: 08/26/2013 10:16 AM   Modules accepted: Orders

## 2013-08-27 NOTE — Telephone Encounter (Signed)
error 

## 2013-08-30 ENCOUNTER — Ambulatory Visit: Payer: Self-pay | Admitting: Adult Health

## 2013-08-30 ENCOUNTER — Other Ambulatory Visit: Payer: Self-pay

## 2013-09-13 ENCOUNTER — Ambulatory Visit: Payer: Self-pay

## 2013-09-21 ENCOUNTER — Encounter: Payer: Self-pay | Admitting: Emergency Medicine

## 2013-09-21 ENCOUNTER — Encounter (INDEPENDENT_AMBULATORY_CARE_PROVIDER_SITE_OTHER): Payer: Self-pay

## 2013-09-21 ENCOUNTER — Ambulatory Visit (INDEPENDENT_AMBULATORY_CARE_PROVIDER_SITE_OTHER): Payer: Commercial Managed Care - HMO | Admitting: Emergency Medicine

## 2013-09-21 VITALS — BP 118/70 | HR 86 | Ht <= 58 in | Wt 112.6 lb

## 2013-09-21 DIAGNOSIS — J984 Other disorders of lung: Secondary | ICD-10-CM

## 2013-09-21 MED ORDER — AMOXICILLIN-POT CLAVULANATE 875-125 MG PO TABS: 1.0000 | ORAL_TABLET | Freq: Two times a day (BID) | ORAL | Status: DC

## 2013-09-21 NOTE — Patient Instructions (Signed)
We will restart your Augmentin twice a day for the next month We will repeat your CT scan of the chest in mid August Follow with Dr. Lamonte Sakai in August after your CT scan to review the results

## 2013-09-21 NOTE — Progress Notes (Signed)
Subjective:    Patient ID: Brianna Hunter, female    DOB: Aug 01, 1928, 80 y.o.   MRN: [id number]  HPI 80 yo never smoker, hx of breast CA ('13). She was admitted 5/1 - 07/15/13 with a cavitary PNA, planned to be treated with Augmentin for 3 weeks. She mentioned a remote Tb exposure, had sputum AFB negative x 3, quant gold was indeterminate.  She has been followed outpt by Dr Johnnye Sima.  She is clinically improved, denies cough (improved before discharge). She has lost wt, has diarrhea - improved with pro-biotics.    Review of Systems  Constitutional: Positive for appetite change and unexpected weight change. Negative for fever.  HENT: Positive for dental problem and sore throat. Negative for congestion, ear pain, nosebleeds, postnasal drip, rhinorrhea, sinus pressure, sneezing and trouble swallowing.   Eyes: Negative for redness and itching.  Respiratory: Positive for cough and shortness of breath. Negative for chest tightness and wheezing.   Cardiovascular: Positive for chest pain and leg swelling. Negative for palpitations.       Hand and feet  Gastrointestinal: Positive for abdominal pain. Negative for nausea and vomiting.  Genitourinary: Negative for dysuria.  Musculoskeletal: Positive for joint swelling.       Stiffness and pain  Skin: Negative for rash.  Neurological: Negative for headaches.  Hematological: Does not bruise/bleed easily.  Psychiatric/Behavioral: Positive for dysphoric mood. The patient is nervous/anxious.     Past Medical History  Diagnosis Date  . Rheumatism   . Back pain   . Hypertension   . Palpitations     a. holter (7/11):  isolated PVCs and PACs  . Breast cancer     a. DCIS s/p R lumpectomy  . Anxiety   . Arthritis     celebrex use off & on for arthritis- hands, back   . Valvular heart disease     a. Echo (7/11):  EF 55-60%, Gr 1 DD, mild-mod AI, mild-mod MR, mod PI, PASP 30;  b. Echo (09/2010):  Mild LVH, EF 60%, Gr 1 DD, mild to mod AI, mild MR,  mild TR, mild to mod PI, PASP 36.;   c.  Echo (05/2013):  EF 60-65%, no RWMA, mild AI, mild to mod PI   . Bifascicular block   . Hyperlipidemia      Family History  Problem Relation Age of Onset  . Diabetes Mother   . Other Mother     Bleeding disorder  . Diabetes Father   . Other Father     Bleeding disorder     History   Social History  . Marital Status: Widowed    Spouse Name: N/A    Number of Children: N/A  . Years of Education: N/A   Occupational History  . Retired    Social History Main Topics  . Smoking status: Never Smoker   . Smokeless tobacco: Never Used  . Alcohol Use: No  . Drug Use: No  . Sexual Activity: Not Currently   Other Topics Concern  . Not on file   Social History Narrative   Widowed   Lives in assisted living and is involved with taking care of her granddaughter     Allergies  Allergen Reactions  . Milk-Related Compounds Other (See Comments)    Frequent bowel movements      Outpatient Prescriptions Prior to Visit  Medication Sig Dispense Refill  . aspirin 81 MG EC tablet Take 81 mg by mouth daily.        Marland Kitchen  celecoxib (CELEBREX) 200 MG capsule Take 200 mg by mouth at bedtime.       . feeding supplement, RESOURCE BREEZE, (RESOURCE BREEZE) LIQD Take 1 Container by mouth 2 (two) times daily between meals.      . ferrous gluconate (FERGON) 325 MG tablet Take 325 mg by mouth daily with breakfast.      . LUMIGAN 0.01 % SOLN Place 1 drop into the right eye at bedtime.       . Multiple Vitamin (MULTIVITAMIN) tablet Take 1 tablet by mouth daily.        Vladimir Faster Glycol-Propyl Glycol (SYSTANE) 0.4-0.3 % SOLN Apply 1 drop to eye as directed.       . tamoxifen (NOLVADEX) 20 MG tablet Take 1 tablet (20 mg total) by mouth daily.  30 tablet  7  . Vitamins A & D (VITAMIN A & D) 10000-400 UNITS CAPS Take 1 capsule by mouth daily.       Marland Kitchen EAR WAX REMOVAL DROPS 6.5 % otic solution Place 5 drops into both ears 2 (two) times daily as needed (for ears).          No facility-administered medications prior to visit.       Objective:   Physical Exam Filed Vitals:   09/21/13 1555  BP: 118/70  Pulse: 86  Height: 4\' 9"  (1.448 m)  Weight: 112 lb 9.6 oz (51.075 kg)  SpO2: 96%   Gen: Pleasant, well-nourished, in no distress,  normal affect  ENT: No lesions,  mouth clear,  oropharynx clear, no postnasal drip  Neck: No JVD, no TMG, no carotid bruits  Lungs: No use of accessory muscles, no dullness to percussion, clear without rales or rhonchi  Cardiovascular: RRR, heart sounds normal, no murmur or gallops, no peripheral edema  Musculoskeletal: No deformities, no cyanosis or clubbing  Neuro: alert, non focal  Skin: Warm, no lesions or rashes  08/19/13 COMPARISON: 07/09/2013.  FINDINGS:  Left upper lobe cavitary lesion remains present. There is less  airspace disease surrounding the lesion. On today's examination,  this measures 67 mm x 38 mm. The cavitation is present centrally,  with a deep tendon meniscus, best seen on the sagittal reconstructed  images. This lesion is a isolated. 1 cm right hilar lymph node is  present, likely reactive. There is no pleural effusion. The  previously seen in posteriorly layering effusion has resolved,  compatible with response to the treatment. There is no axillary  adenopathy. Aortic arch atherosclerosis. Heart grossly normal.  Incidental imaging of the upper abdomen shows left renal cysts. No  aggressive osseous lesions. Severe thoracic and lower cervical  spondylosis.  IMPRESSION:  1. Posterior right upper lobe cavitary lesion today measures 67 mm x  38 mm.  2. Resolution of right pleural effusion and decreased airspace  disease around the cavitary lesion suggests response the treatment.  Persistent fluid level within the cavitary portion.  3. Borderline right hilar adenopathy is probably reactive.  4. Continued follow-up to ensure resolution and expected evolution  to scarring is  recommended.      Assessment & Plan:  Cavitary lesion of lung Right upper lobe cavitary pneumonia improved on CT scan from 08/19/13. The patient was prescribed Augmentin. She care of them or whether she took this or not but the pharmacy indicates that she did fill the prescription. Her residual cavity appears to be an abscess and will likely not resolve without further antibiotics. She does have a history of breast cancer I feel that  this is low likelihood to be malignancy. - Will treat with Augmentin for another 3-4 weeks - Repeat CT scan of the chest in mid August - Followed at that time to assess for interval improvement - she may require surgical referral if the cavity does not resolve with antibiotics alone

## 2013-09-21 NOTE — Assessment & Plan Note (Signed)
Right upper lobe cavitary pneumonia improved on CT scan from 08/19/13. The patient was prescribed Augmentin. She care of them or whether she took this or not but the pharmacy indicates that she did fill the prescription. Her residual cavity appears to be an abscess and will likely not resolve without further antibiotics. She does have a history of breast cancer I feel that this is low likelihood to be malignancy. - Will treat with Augmentin for another 3-4 weeks - Repeat CT scan of the chest in mid August - Followed at that time to assess for interval improvement - she may require surgical referral if the cavity does not resolve with antibiotics alone

## 2013-09-24 ENCOUNTER — Telehealth: Payer: Self-pay | Admitting: Emergency Medicine

## 2013-09-24 NOTE — Telephone Encounter (Signed)
Called and spoke to pt. Pt states she has been having diarrhea for 2 days from the Augmentin that was restarted on 09/22/2013. Pt states she has had 2 episodes yesterday and 2 today so far, the stools are liquid. Pt confirmed she is drinking around 6 glasses of water daily and is taking the Florastor daily. Pt was concerned about loosing weight. Pt denies N/V.   RB please advise. Thanks.   Allergies  Allergen Reactions  . Milk-Related Compounds Other (See Comments)    Frequent bowel movements       Current Outpatient Prescriptions on File Prior to Visit  Medication Sig Dispense Refill  . amoxicillin-clavulanate (AUGMENTIN) 875-125 MG per tablet Take 1 tablet by mouth 2 (two) times daily.  56 tablet  0  . aspirin 81 MG EC tablet Take 81 mg by mouth daily.        . celecoxib (CELEBREX) 200 MG capsule Take 200 mg by mouth at bedtime.       Marland Kitchen EAR WAX REMOVAL DROPS 6.5 % otic solution Place 5 drops into both ears 2 (two) times daily as needed (for ears).       . feeding supplement, RESOURCE BREEZE, (RESOURCE BREEZE) LIQD Take 1 Container by mouth 2 (two) times daily between meals.      . ferrous gluconate (FERGON) 325 MG tablet Take 325 mg by mouth daily with breakfast.      . LUMIGAN 0.01 % SOLN Place 1 drop into the right eye at bedtime.       . Multiple Vitamin (MULTIVITAMIN) tablet Take 1 tablet by mouth daily.        Vladimir Faster Glycol-Propyl Glycol (SYSTANE) 0.4-0.3 % SOLN Apply 1 drop to eye as directed.       . saccharomyces boulardii (FLORASTOR) 250 MG capsule Take 250 mg by mouth daily.      . tamoxifen (NOLVADEX) 20 MG tablet Take 1 tablet (20 mg total) by mouth daily.  30 tablet  7  . Vitamins A & D (VITAMIN A & D) 10000-400 UNITS CAPS Take 1 capsule by mouth daily.        No current facility-administered medications on file prior to visit.

## 2013-09-24 NOTE — Telephone Encounter (Signed)
Please have the pt hold the Augmentin through the weekend to insure that diarrhea improves. She needs to call us next week to let us know if the diarrhea continues. We can consider restarting the abx next week if her diarrhea improves. If it continues we will need to do further w/u including having her check C diff.

## 2013-09-24 NOTE — Telephone Encounter (Signed)
Called and spoke to pt and informed pt of recs per RB. Pt verbalized understanding and denied any further questions or concerns at this time.   LMTCB on daughters work machine to inform her of the recs made by RB.

## 2013-09-24 NOTE — Telephone Encounter (Signed)
Spoke with the pt's daughter and notified of recs per RB  She verbalized understanding  Will call next wk with update on pt  Nothing further needed

## 2013-09-24 NOTE — Telephone Encounter (Signed)
Pt daughter returning call.Brianna Hunter ° °

## 2013-09-27 ENCOUNTER — Telehealth: Payer: Self-pay | Admitting: Emergency Medicine

## 2013-09-27 MED ORDER — CEFDINIR 300 MG PO CAPS: 300.0000 mg | ORAL_CAPSULE | Freq: Two times a day (BID) | ORAL | Status: DC

## 2013-09-27 NOTE — Telephone Encounter (Signed)
Rx was sent to pharm  I spoke with the pt's daughter and notified of recs  She verbalized understanding  Offered ov with TP in 2 wks (RB booked) She wished to wait until the next wk since she is going to be out of town OV with TP for 10/20/13

## 2013-09-27 NOTE — Telephone Encounter (Signed)
Pt daughter calling bk needing to speak to nurse a/b mother's medication asap.Hillery Hunter

## 2013-09-27 NOTE — Telephone Encounter (Signed)
lmtcb x 1 for May Norling (daughter) to return call.

## 2013-09-27 NOTE — Telephone Encounter (Signed)
If the cough and congestion is better then no need to come in or resume antibiotics If worse or not improved, then OV with TP

## 2013-09-27 NOTE — Telephone Encounter (Signed)
Pt's daughter, May Firmin, returned call & asks to be reached at [phone].  Satira Anis

## 2013-09-27 NOTE — Telephone Encounter (Signed)
Spoke with pt daughter--refused to make appt with TP or any other physician at this time. Daughter states that she does not totally agree with the recommendations given below and wishes to discuss this with the pts PCP before scheduling another visit with our office. Daughter states that the reason the patient was referred to our office was for the lung lesion/PNA--was placed on extension of Augmentin to treat the bacterial infection. Daughter states that these recommendations concern her because of the little amount of time the patient has taken the abx and had to stop, the infection is still there and they are being advised to not take any further antibiotics at this time. Daughter states that even though the cough and congestion is somewhat improved, does not mean the infection is gone. Daughter stated she does not agree with this at all and it is "playing with her mother's life." Offered appt again for the patient to be seen by TP or any other physician in our office this week and she refused stating that she was not going to schedule at this time, feels she needs to address the PCP about all this first.

## 2013-09-27 NOTE — Telephone Encounter (Signed)
Just about any chronic antibiotic has this issue - ok to call in omnicef 300 mg bid x 10 days but eat yogurt for lunch then regroup with Dr Chase Caller in 2 weeks

## 2013-09-27 NOTE — Telephone Encounter (Addendum)
Spoke with pt daughter Itzia Cunliffe States that pt was advised to stop Augmentin on Friday 09/24/13 d/t increased GI upset. Pt is on a medication to help make her gain weight and the Augmentin was counter-acting this due to having increased diarrhea. Daughter states that she was advised to stop her abx and call us back today to let us know how she is feeling.  Diarrhea is much improved Little cough/congestion at times  Allergies  Allergen Reactions  . Milk-Related Compounds Other (See Comments)    Frequent bowel movements    Daughter requesting to know if patient needs to be on another abx? Lower dose Augmentin? OV with TP? Please advise Dr Lake Bells as Dr Lamonte Sakai is not in office. Thanks.

## 2013-09-27 NOTE — Telephone Encounter (Signed)
Spoke with the pt's daughter  I advised her of recs per Dr. Lake Bells  She states that "that does not sound right" She states that even though the cough is gone, she still has "lesion" and needs to take something for it  She wants something called in today  Will forward to doc of the day Please advise thanks!

## 2013-09-27 NOTE — Telephone Encounter (Signed)
Pt's daughter states she is returning a call from our nurse & can be reached at [phone] & 5:00 can be reached at [phone] after.  Satira Anis

## 2013-10-11 ENCOUNTER — Other Ambulatory Visit: Payer: Self-pay

## 2013-10-11 ENCOUNTER — Ambulatory Visit: Payer: Self-pay | Admitting: Adult Health

## 2013-10-12 ENCOUNTER — Telehealth: Payer: Self-pay | Admitting: Adult Health

## 2013-10-12 NOTE — Telephone Encounter (Signed)
, °

## 2013-10-14 ENCOUNTER — Telehealth: Payer: Self-pay | Admitting: Emergency Medicine

## 2013-10-14 NOTE — Telephone Encounter (Signed)
Left message on patient's cell; informed her that she has a follow up appointment on 8/7 at 1:45 for labs and 2:15 with Charlestine Massed NP. Instructed patient to call if she had any questions or concerns.

## 2013-10-15 ENCOUNTER — Ambulatory Visit (HOSPITAL_BASED_OUTPATIENT_CLINIC_OR_DEPARTMENT_OTHER): Payer: Commercial Managed Care - HMO | Admitting: Adult Health

## 2013-10-15 ENCOUNTER — Encounter: Payer: Self-pay | Admitting: Adult Health

## 2013-10-15 ENCOUNTER — Other Ambulatory Visit (HOSPITAL_BASED_OUTPATIENT_CLINIC_OR_DEPARTMENT_OTHER): Payer: Commercial Managed Care - HMO

## 2013-10-15 VITALS — BP 138/68 | HR 86 | Temp 97.9°F | Resp 18 | Ht <= 58 in | Wt 109.4 lb

## 2013-10-15 DIAGNOSIS — Z170 Estrogen receptor positive status [ER+]: Secondary | ICD-10-CM

## 2013-10-15 DIAGNOSIS — R197 Diarrhea, unspecified: Secondary | ICD-10-CM

## 2013-10-15 DIAGNOSIS — C50311 Malignant neoplasm of lower-inner quadrant of right female breast: Secondary | ICD-10-CM

## 2013-10-15 DIAGNOSIS — D509 Iron deficiency anemia, unspecified: Secondary | ICD-10-CM

## 2013-10-15 DIAGNOSIS — C50319 Malignant neoplasm of lower-inner quadrant of unspecified female breast: Secondary | ICD-10-CM

## 2013-10-15 DIAGNOSIS — D0590 Unspecified type of carcinoma in situ of unspecified breast: Secondary | ICD-10-CM

## 2013-10-15 DIAGNOSIS — M7989 Other specified soft tissue disorders: Secondary | ICD-10-CM

## 2013-10-15 DIAGNOSIS — E559 Vitamin D deficiency, unspecified: Secondary | ICD-10-CM

## 2013-10-15 LAB — COMPREHENSIVE METABOLIC PANEL (CC13)
ALBUMIN: 3.1 g/dL — AB (ref 3.5–5.0)
ALK PHOS: 40 U/L (ref 40–150)
ALT: 7 U/L (ref 0–55)
AST: 13 U/L (ref 5–34)
Anion Gap: 8 mEq/L (ref 3–11)
BILIRUBIN TOTAL: 0.41 mg/dL (ref 0.20–1.20)
BUN: 17 mg/dL (ref 7.0–26.0)
CO2: 27 mEq/L (ref 22–29)
CREATININE: 0.9 mg/dL (ref 0.6–1.1)
Calcium: 9.5 mg/dL (ref 8.4–10.4)
Chloride: 106 mEq/L (ref 98–109)
GLUCOSE: 86 mg/dL (ref 70–140)
POTASSIUM: 4.6 meq/L (ref 3.5–5.1)
Sodium: 141 mEq/L (ref 136–145)
Total Protein: 7 g/dL (ref 6.4–8.3)

## 2013-10-15 LAB — CBC WITH DIFFERENTIAL/PLATELET
BASO%: 0.7 % (ref 0.0–2.0)
BASOS ABS: 0.1 10*3/uL (ref 0.0–0.1)
EOS%: 1.3 % (ref 0.0–7.0)
Eosinophils Absolute: 0.1 10*3/uL (ref 0.0–0.5)
HEMATOCRIT: 32.9 % — AB (ref 34.8–46.6)
HEMOGLOBIN: 10 g/dL — AB (ref 11.6–15.9)
LYMPH#: 2 10*3/uL (ref 0.9–3.3)
LYMPH%: 25 % (ref 14.0–49.7)
MCH: 21.8 pg — ABNORMAL LOW (ref 25.1–34.0)
MCHC: 30.5 g/dL — ABNORMAL LOW (ref 31.5–36.0)
MCV: 71.4 fL — AB (ref 79.5–101.0)
MONO#: 1 10*3/uL — ABNORMAL HIGH (ref 0.1–0.9)
MONO%: 12.6 % (ref 0.0–14.0)
NEUT#: 4.9 10*3/uL (ref 1.5–6.5)
NEUT%: 60.4 % (ref 38.4–76.8)
PLATELETS: 174 10*3/uL (ref 145–400)
RBC: 4.61 10*6/uL (ref 3.70–5.45)
RDW: 15.8 % — ABNORMAL HIGH (ref 11.2–14.5)
WBC: 8.2 10*3/uL (ref 3.9–10.3)

## 2013-10-15 NOTE — Progress Notes (Addendum)
OFFICE PROGRESS NOTE  CC  Brianna Peers, MD [address] [place] [address] Dr. Madilyn Hook Dr. Eppie Gibson  DIAGNOSIS: 80 year old female status post right lumpectomy for DCIS.  PRIOR THERAPY:  #1 patient was originally seen in the multidisciplinary breast clinic with Dr. Madilyn Hook and Dr. Eppie Gibson. She was her diagnosed with DCIS of the right breast. Since then she has undergone a right breast needle localized lumpectomy. The final pathology revealed a 4 mm focus of DCIS with negative margins. Tumor was estrogen receptor positive.  #2 patient was started on chemoprevention with tamoxifen 20 mg daily in September, 2013 by Dr. Humphrey Rolls.  A total of 5 years of therapy is planned.   CURRENT THERAPY: tamoxifen 20 mg daily   INTERVAL HISTORY: Brianna Hunter 80 y.o. female returns for followup visit for evaluation of her right breast DCIS.  She is taking Tamoxifen daily.  She tells me that it doesn't bother her and she sees her PCP Dr. Criss Rosales about every month to every other month.  She is fatigued, however does have a microcytic anemia and some diarrhea since her discharge from the hospital.  She is taking iron supplements daily.  She denies fevers, chills, nausea, vomiting, constipation, diarrhea, numbness/tingling, headaches, breast changes, vaginal bleeding or any further concerns.   MEDICAL HISTORY: Past Medical History  Diagnosis Date  . Rheumatism   . Back pain   . Hypertension   . Palpitations     a. holter (7/11):  isolated PVCs and PACs  . Breast cancer     a. DCIS s/p R lumpectomy  . Anxiety   . Arthritis     celebrex use off & on for arthritis- hands, back   . Valvular heart disease     a. Echo (7/11):  EF 55-60%, Gr 1 DD, mild-mod AI, mild-mod MR, mod PI, PASP 30;  b. Echo (09/2010):  Mild LVH, EF 60%, Gr 1 DD, mild to mod AI, mild MR, mild TR, mild to mod PI, PASP 36.;   c.  Echo (05/2013):  EF 60-65%, no RWMA, mild AI, mild to mod PI   . Bifascicular  block   . Hyperlipidemia     ALLERGIES:  is allergic to milk-related compounds and augmentin.  MEDICATIONS:  Current Outpatient Prescriptions  Medication Sig Dispense Refill  . aspirin 81 MG EC tablet Take 81 mg by mouth daily.        . celecoxib (CELEBREX) 200 MG capsule Take 200 mg by mouth at bedtime.       Marland Kitchen EAR WAX REMOVAL DROPS 6.5 % otic solution Place 5 drops into both ears 2 (two) times daily as needed (for ears).       . ferrous gluconate (FERGON) 325 MG tablet Take 325 mg by mouth daily with breakfast.      . LUMIGAN 0.01 % SOLN Place 1 drop into the right eye at bedtime.       . Multiple Vitamin (MULTIVITAMIN) tablet Take 1 tablet by mouth daily.        Vladimir Faster Glycol-Propyl Glycol (SYSTANE) 0.4-0.3 % SOLN Apply 1 drop to eye as directed.       . tamoxifen (NOLVADEX) 20 MG tablet Take 1 tablet (20 mg total) by mouth daily.  30 tablet  7  . Vitamins A & D (VITAMIN A & D) 10000-400 UNITS CAPS Take 1 capsule by mouth daily.       . feeding supplement, RESOURCE BREEZE, (RESOURCE BREEZE) LIQD  Take 1 Container by mouth 2 (two) times daily between meals.      . saccharomyces boulardii (FLORASTOR) 250 MG capsule Take 250 mg by mouth daily.       No current facility-administered medications for this visit.    SURGICAL HISTORY:  Past Surgical History  Procedure Laterality Date  . Knee surgery      Left  . Shoulder surgery      Left  . Joint replacement      2008- L knee  . Appendectomy    . Abdominal hysterectomy      partial   . Eye surgery      left eye 2013  . Ear cyst excision  11/07/2011    Procedure: CYST REMOVAL;  Surgeon: Madilyn Hook, DO;  Location: [facility];  Service: General;  Laterality: Right;  Removal Cyst Right Upper Chest    REVIEW OF SYSTEMS:   A 10 point review of systems was conducted and is otherwise negative except for what is noted above.    Health Maintenance  Mammogram: not in system, has done at [facility], per patient done last on 10/06/13 and  normal Colonoscopy: 2011 Bone Density Scan: patient unsure of tests Pap Smear: s/p partial hysterectomy Eye Exam: 09/08/2013 Vitamin D Level: unsure when last tested Lipid Panel: unsure when last tested    PHYSICAL EXAMINATION:  BP 138/68  Pulse 86  Temp(Src) 97.9 F (36.6 C) (Oral)  Resp 18  Ht 4\' 9"  (1.448 m)  Wt 109 lb 6.4 oz (49.624 kg)  BMI 23.67 kg/m2 GENERAL: Patient is a well appearing female in no acute distress HEENT:  Sclerae anicteric.  Oropharynx clear and moist. No ulcerations or evidence of oropharyngeal candidiasis. Neck is supple.  NODES:  No cervical, supraclavicular, or axillary lymphadenopathy palpated.  BREAST EXAM: s/p right lumpectomy, no nodularity, no masses, left breast no masses, nodules, benign bilateral breast exam LUNGS:  Clear to auscultation bilaterally.  No wheezes or rhonchi. HEART:  Regular rate and rhythm. No murmur appreciated. ABDOMEN:  Soft, nontender.  Positive, normoactive bowel sounds. No organomegaly palpated. MSK:  No focal spinal tenderness to palpation. Full range of motion bilaterally in the upper extremities. EXTREMITIES:  No peripheral edema.   SKIN:  Clear with no obvious rashes or skin changes. No nail dyscrasia. NEURO:  Nonfocal. Well oriented.  Appropriate affect. ECOG PERFORMANCE STATUS: 0 - Asymptomatic  LABORATORY DATA: Lab Results  Component Value Date   WBC 8.2 10/15/2013   HGB 10.0* 10/15/2013   HCT 32.9* 10/15/2013   MCV 71.4* 10/15/2013   PLT 174 10/15/2013      Chemistry      Component Value Date/Time   NA 141 10/15/2013 1342   NA 143 07/14/2013 0524   K 4.6 10/15/2013 1342   K 3.6* 07/14/2013 0524   CL 108 07/14/2013 0524   CL 107 08/19/2012 1026   CO2 27 10/15/2013 1342   CO2 27 07/14/2013 0524   BUN 17.0 10/15/2013 1342   BUN 15 07/14/2013 0524   CREATININE 0.9 10/15/2013 1342   CREATININE 0.65 07/14/2013 0524      Component Value Date/Time   CALCIUM 9.5 10/15/2013 1342   CALCIUM 8.7 07/14/2013 0524   ALKPHOS 40 10/15/2013 1342    ALKPHOS 84 07/10/2013 0850   AST 13 10/15/2013 1342   AST 27 07/10/2013 0850   ALT 7 10/15/2013 1342   ALT 10 07/10/2013 0850   BILITOT 0.41 10/15/2013 1342   BILITOT 0.4 07/10/2013 0850  FINAL DIAGNOSIS Diagnosis 1. Breast, lumpectomy, Right - DUCTAL CARCINOMA IN SITU WITH CALCIFICATIONS, INTERMEDIATE GRADE, SPANNING 0.4 CM. - THE SURGICAL RESECTION MARGINS ARE NEGATIVE FOR CARCINOMA. - SEE ONCOLOGY TABLE BELOW. 2. Breast, excision, additional inferior margin - BENIGN BREAST PARENCHYMA. - THERE IS NO EVIDENCE OF MALIGNANCY. - SEE COMMENT. 3. Skin , Chest cyst - EPIDERMOID INCLUSION CYST. - THERE IS NO EVIDENCE OF MALIGNANCY. Microscopic Comment 1. BREAST, IN SITU CARCINOMA Specimen, including laterality: Right breast Procedure: Needle localized lumpectomy Grade of carcinoma: Intermediate grade Necrosis: Not identified Estimated tumor size: (glass slide measurement): 0.4 cm Treatment effect: N/A Distance to closest margin: Greater than 0.2 cm to all margins (gross measurements). Breast prognostic profile: Case [phone] Estrogen receptor: 100%, strong staining intensity. Progesterone receptor: 100%, strong staining intensity Lymph nodes: None examined TNM: pTis, pNX Comments: In addition to the ductal carcinoma in situ involving a papilloma, there is a fibroadenoma, fibrocystic changes and vascular calcifications. (JBK:kh 11-12-11) 2. The surgical resection m  RADIOGRAPHIC STUDIES:  No results found.  ASSESSMENT: 80 year old female with  #1 stage 0 (DCIS) of the right breast status post needle.wire localized lumpectomy with the final pathology revealing a 4 mm focus of DCIS that was ER positive. Postoperatively she is doing well without any problems. Patient is now on tamoxifen 20 mg daily as a chemopreventive. She is tolerating it well without any significant problems. Total of 5 years of therapy is planned.   PLAN:   Luda is doing well today.  She is taking tamoxifen  daily and tolerating it well.  She does have some swelling in her right leg noted on exam and I discussed this with her in detail.  I ordered a doppler of the right lower extremity.    I reviewed her labs with her.  She has a microcytic anemia.  She tells me that Dr. Criss Rosales has her on oral iron daily, and she has remained fatigued.  We added on iron studies today, and gave her stool cards.  Dr. Lindi Adie has planned on calling her PCP to talk to her about getting her in for a repeat colonoscopy.    For the diarrhea I ordered a Cdiff to be done today prior to her leaving the cancer center if possible.    The patient will return in 1 month for labs and follow up with Dr. Lindi Adie.  The above plan was discussed with her.  She is having difficulty with co-pays so I walked her to [employer] office, our financial advocate.    All questions were answered. The patient knows to call the clinic with any problems, questions or concerns. We can certainly see the patient much sooner if necessary.  I spent 25 minutes counseling the patient face to face. The total time spent in the appointment was 30 minutes.  Minette Headland, NP Medical Oncology [facility] [phone] 10/15/2013, 2:33 PM       Attending Note  I personally saw and examined Brianna Hunter. The plan of care was discussed with her. I agree with the assessment and plan as documented above. The summary of her visit is as follows Plan: 1. DCIS Right breast S/P lumpectomy. Now on Tamoxifen. Had mammograms. Results not available but patient reports they were normal. She will get them annually 2. Leg swelling: evaluate for DVT with U/S 3. Microcytic anemia: Stool hemoccults and refer to GI (Dr.Mann). Increase oral iron to twice daily. 4. Diarrhea: Check stools for c.diff because of recent antibiotics 5.  Weight loss 6. Pneumonia: patient has a CT scheduled for next week and followup with pulmonary. 7. Return to clinic in  1 month  D/W Dr.Bland (PCP)   Signed Rulon Eisenmenger, MD

## 2013-10-16 ENCOUNTER — Ambulatory Visit (HOSPITAL_COMMUNITY): Payer: Medicare HMO | Attending: Adult Health

## 2013-10-16 LAB — VITAMIN D 25 HYDROXY (VIT D DEFICIENCY, FRACTURES): Vit D, 25-Hydroxy: 46 ng/mL (ref 30–89)

## 2013-10-18 ENCOUNTER — Encounter: Payer: Self-pay | Admitting: Adult Health

## 2013-10-18 NOTE — Progress Notes (Signed)
Patient called me to say she called and spoke with billing on paying her hosp bill.

## 2013-10-20 ENCOUNTER — Ambulatory Visit (INDEPENDENT_AMBULATORY_CARE_PROVIDER_SITE_OTHER): Payer: Commercial Managed Care - HMO | Admitting: Adult Health

## 2013-10-20 ENCOUNTER — Telehealth: Payer: Self-pay | Admitting: Adult Health

## 2013-10-20 NOTE — Telephone Encounter (Signed)
, °

## 2013-10-21 ENCOUNTER — Telehealth: Payer: Self-pay

## 2013-10-21 NOTE — Telephone Encounter (Signed)
Rcvd by fax from [facility] reports dtd 04/22/13 and 10/06/13.  Copy to [facility]. Original to scan.

## 2013-10-22 ENCOUNTER — Encounter: Payer: Self-pay | Admitting: Adult Health

## 2013-10-22 ENCOUNTER — Ambulatory Visit (INDEPENDENT_AMBULATORY_CARE_PROVIDER_SITE_OTHER): Payer: Commercial Managed Care - HMO | Admitting: Adult Health

## 2013-10-22 ENCOUNTER — Ambulatory Visit (INDEPENDENT_AMBULATORY_CARE_PROVIDER_SITE_OTHER)
Admission: RE | Admit: 2013-10-22 | Discharge: 2013-10-22 | Disposition: A | Discharge status: Home / Self Care | Payer: Commercial Managed Care - HMO | Source: Ambulatory Visit | Attending: Emergency Medicine | Admitting: Emergency Medicine

## 2013-10-22 VITALS — BP 114/62 | HR 93 | Temp 98.2°F | Ht <= 58 in | Wt 108.0 lb

## 2013-10-22 DIAGNOSIS — J984 Other disorders of lung: Secondary | ICD-10-CM

## 2013-10-22 NOTE — Patient Instructions (Signed)
Follow up with family doctor regarding diarrhea.  Continue on Probiotic daily  Follow up 1 month Dr. Lamonte Sakai  With chest xray . Please contact office for sooner follow up if symptoms do not improve or worsen or seek emergency care

## 2013-10-22 NOTE — Assessment & Plan Note (Addendum)
RUL cavitary PNA -this has improved on CT however not resolved  Was unable to tolerate 4 weeks of abx, but seen significant improvement with 10 d of omnicef.  Will hold on additonal abx at this time d/t diarrhea/wt loss.  Recheck cxr in 4 weeks and decide on need for additonal therapy and CT follow up  Discussed dental care  Plan  Follow up with family doctor regarding diarrhea.  Continue on Probiotic daily  Follow up 1 month Dr. Lamonte Sakai  With chest xray . Please contact office for sooner follow up if symptoms do not improve or worsen or seek emergency care

## 2013-10-22 NOTE — Progress Notes (Signed)
   Subjective:    Patient ID: Brianna Hunter, female    DOB: 04/10/1928, 80 y.o.   MRN: [id number]  HPI 80 yo never smoker, hx of breast CA ('13). She was admitted 5/1 - 07/15/13 with a cavitary PNA, planned to be treated with Augmentin for 3 weeks. She mentioned a remote Tb exposure, had sputum AFB negative x 3, quant gold was indeterminate.  She has been followed outpt by Dr Johnnye Sima.    10/22/2013 Follow up cavitary PNA Pt returns today for follow up after recent CT.  She was treated with additional 4 weeks of augmentin.  Was unable to tolerate Augmentin, after 2 days had severe diarrhea. This was stopped and changed to omnicef x 10 d .  Still has some diarrhea but much better. Was ordered a C Diff cx but did not turn in. Advised to make ov with PCP to discuss.  CT shows significant improvement w/ significant decrease. Cavitary mass has decreased 5.3 x 3.3 cm .  She is feeling better w/ less cough.  No fever, discolored mucus, n/v/ .  Is here with her daughter today.    Review of Systems Constitutional:   No  weight loss, night sweats,  Fevers, chills, + fatigue, or  lassitude.  HEENT:   No headaches,  Difficulty swallowing,  Tooth/dental problems, or  Sore throat,                No sneezing, itching, ear ache, +nasal congestion, post nasal drip,   CV:  No chest pain,  Orthopnea, PND, swelling in lower extremities, anasarca, dizziness, palpitations, syncope.   GI  No heartburn, indigestion, abdominal pain, nausea, vomiting,   Resp:    No chest wall deformity  Skin: no rash or lesions.  GU: no dysuria, change in color of urine, no urgency or frequency.  No flank pain, no hematuria   MS:  No joint pain or swelling.  No decreased range of motion.  No back pain.  Psych:  No change in mood or affect. No depression or anxiety.  No memory loss.           Objective:   Physical Exam GEN: A/Ox3; pleasant , NAD, thin frail   HEENT:  [place]/AT,  EACs-clear, TMs-wnl, NOSE-clear,  THROAT-clear, no lesions, no postnasal drip or exudate noted. Poor dentition   NECK:  Supple w/ fair ROM; no JVD; normal carotid impulses w/o bruits; no thyromegaly or nodules palpated; no lymphadenopathy.  RESP  Clear  P & A; w/o, wheezes/ rales/ or rhonchi.no accessory muscle use, no dullness to percussion  CARD:  RRR, no m/r/g  , tr peripheral edema, pulses intact, no cyanosis or clubbing.  GI:   Soft & nt; nml bowel sounds; no organomegaly or masses detected.  Musco: Warm bil, no deformities or joint swelling noted.   Neuro: alert, no focal deficits noted.    Skin: Warm, no lesions or rashes         Assessment & Plan:

## 2013-10-25 ENCOUNTER — Other Ambulatory Visit: Payer: Self-pay | Admitting: *Deleted

## 2013-10-25 DIAGNOSIS — C50311 Malignant neoplasm of lower-inner quadrant of right female breast: Secondary | ICD-10-CM

## 2013-10-25 MED ORDER — TAMOXIFEN CITRATE 20 MG PO TABS: 20.0000 mg | ORAL_TABLET | Freq: Every day | ORAL | Status: DC

## 2013-11-01 ENCOUNTER — Ambulatory Visit: Payer: Self-pay | Admitting: Emergency Medicine

## 2013-11-12 ENCOUNTER — Ambulatory Visit (HOSPITAL_BASED_OUTPATIENT_CLINIC_OR_DEPARTMENT_OTHER): Payer: Commercial Managed Care - HMO | Admitting: Hematology and Oncology

## 2013-11-12 ENCOUNTER — Telehealth: Payer: Self-pay | Admitting: Hematology and Oncology

## 2013-11-12 ENCOUNTER — Other Ambulatory Visit: Payer: Commercial Managed Care - HMO

## 2013-11-12 ENCOUNTER — Encounter: Payer: Self-pay | Admitting: Hematology and Oncology

## 2013-11-12 VITALS — BP 159/73 | HR 86 | Temp 97.8°F | Resp 18 | Ht <= 58 in | Wt 111.5 lb

## 2013-11-12 DIAGNOSIS — D509 Iron deficiency anemia, unspecified: Secondary | ICD-10-CM

## 2013-11-12 DIAGNOSIS — D0590 Unspecified type of carcinoma in situ of unspecified breast: Secondary | ICD-10-CM

## 2013-11-12 NOTE — Telephone Encounter (Signed)
per pof to sch pt appt-gave pt copy of sch °

## 2013-11-12 NOTE — Progress Notes (Signed)
Patient Care Team: [name], MD as PCP - General (Family Medicine)  DIAGNOSIS: Cancer of lower-inner quadrant of female breast   Primary site: Breast (Right)   Staging method: AJCC 7th Edition   Clinical: Stage 0 (Tis (DCIS), N0, cM0)   Summary: Stage 0 (Tis (DCIS), N0, cM0)   Clinical comments: Staged at breast conference 7.31.13   SUMMARY OF ONCOLOGIC HISTORY:   Cancer of lower-inner quadrant of female breast   10/02/2011 Initial Biopsy Low-grade DCIS arising in a background of papilloma ER 100%, PR 100%   11/07/2011 Surgery Right breast lumpectomy DCIS intermediate grade 0.4 cm   11/29/2011 -  Anti-estrogen oral therapy Tamoxifen 20 mg daily    CHIEF COMPLIANT: Diarrhea has resolved  INTERVAL HISTORY: Brianna Hunter is 80 year old American lady with above-mentioned history of DCIS of the right breast currently on tamoxifen tolerating it very well. Last visit she had a lot of diarrhea issues which have improved with probiotics. She was also found to be severely microcytic I asked her to increase oral iron therapy twice a day but she did not take twice a day. She reports no fatigue or shortness of breath. She hasn't yet had an appointment gastroenterologists.   REVIEW OF SYSTEMS:   Constitutional: Denies fevers, chills or abnormal weight loss Eyes: Denies blurriness of vision Ears, nose, mouth, throat, and face: Denies mucositis or sore throat Respiratory: Denies cough, dyspnea or wheezes Cardiovascular: Denies palpitation, chest discomfort or lower extremity swelling Gastrointestinal:  Denies nausea, heartburn or change in bowel habits Skin: Denies abnormal skin rashes Lymphatics: Denies new lymphadenopathy or easy bruising Neurological:Denies numbness, tingling or new weaknesses Behavioral/Psych: Mood is stable, no new changes  Breast:  denies any pain or lumps or nodules in either breasts All other systems were reviewed with the patient and are negative.  I have reviewed the  past medical history, past surgical history, social history and family history with the patient and they are unchanged from previous note.  ALLERGIES:  is allergic to milk-related compounds and augmentin.  MEDICATIONS:  Current Outpatient Prescriptions  Medication Sig Dispense Refill  . aspirin 81 MG EC tablet Take 81 mg by mouth daily.        . celecoxib (CELEBREX) 200 MG capsule Take 200 mg by mouth at bedtime.       Marland Kitchen EAR WAX REMOVAL DROPS 6.5 % otic solution Place 5 drops into both ears 2 (two) times daily as needed (for ears).       . ferrous gluconate (FERGON) 325 MG tablet Take 325 mg by mouth daily with breakfast.      . LUMIGAN 0.01 % SOLN Place 1 drop into the right eye at bedtime.       . Multiple Vitamin (MULTIVITAMIN) tablet Take 1 tablet by mouth daily.        Brianna Hunter Glycol-Propyl Glycol (SYSTANE) 0.4-0.3 % SOLN Apply 1 drop to eye as directed.       . Probiotic Product (SOLUBLE FIBER/PROBIOTICS PO) Take by mouth.      . saccharomyces boulardii (FLORASTOR) 250 MG capsule Take 250 mg by mouth daily.      . tamoxifen (NOLVADEX) 20 MG tablet Take 1 tablet (20 mg total) by mouth daily.  30 tablet  0  . Vitamins A & D (VITAMIN A & D) 10000-400 UNITS CAPS Take 1 capsule by mouth daily.       . feeding supplement, RESOURCE BREEZE, (RESOURCE BREEZE) LIQD Take 1 Container by mouth 2 (two) times daily  between meals.       No current facility-administered medications for this visit.    PHYSICAL EXAMINATION: ECOG PERFORMANCE STATUS: 0 - Asymptomatic  Filed Vitals:   11/12/13 1111  BP: 159/73  Pulse: 86  Temp: 97.8 F (36.6 C)  Resp: 18   Filed Weights   11/12/13 1111  Weight: 111 lb 8 oz (50.576 kg)    GENERAL:alert, no distress and comfortable SKIN: skin color, texture, turgor are normal, no rashes or significant lesions EYES: normal, Conjunctiva are pink and non-injected, sclera clear OROPHARYNX:no exudate, no erythema and lips, buccal mucosa, and tongue normal   NECK: supple, thyroid normal size, non-tender, without nodularity LYMPH:  no palpable lymphadenopathy in the cervical, axillary or inguinal LUNGS: clear to auscultation and percussion with normal breathing effort HEART: regular rate & rhythm and no murmurs and no lower extremity edema ABDOMEN:abdomen soft, non-tender and normal bowel sounds Musculoskeletal:no cyanosis of digits and no clubbing  NEURO: alert & oriented x 3 with fluent speech, no focal motor/sensory deficits   LABORATORY DATA:  I have reviewed the data as listed   Chemistry      Component Value Date/Time   NA 141 10/15/2013 1342   NA 143 07/14/2013 0524   K 4.6 10/15/2013 1342   K 3.6* 07/14/2013 0524   CL 108 07/14/2013 0524   CL 107 08/19/2012 1026   CO2 27 10/15/2013 1342   CO2 27 07/14/2013 0524   BUN 17.0 10/15/2013 1342   BUN 15 07/14/2013 0524   CREATININE 0.9 10/15/2013 1342   CREATININE 0.65 07/14/2013 0524      Component Value Date/Time   CALCIUM 9.5 10/15/2013 1342   CALCIUM 8.7 07/14/2013 0524   ALKPHOS 40 10/15/2013 1342   ALKPHOS 84 07/10/2013 0850   AST 13 10/15/2013 1342   AST 27 07/10/2013 0850   ALT 7 10/15/2013 1342   ALT 10 07/10/2013 0850   BILITOT 0.41 10/15/2013 1342   BILITOT 0.4 07/10/2013 0850       Lab Results  Component Value Date   WBC 8.2 10/15/2013   HGB 10.0* 10/15/2013   HCT 32.9* 10/15/2013   MCV 71.4* 10/15/2013   PLT 174 10/15/2013   NEUTROABS 4.9 10/15/2013     RADIOGRAPHIC STUDIES: I have personally reviewed the radiology reports and agreed with their findings. No results found.  CT chest done on 10/22/2013 revealed decreased size of the right upper lobe irregular mass now measuring 5.3 x 3.3 cm patient being followed by pulmonology  ASSESSMENT & PLAN:  Cancer of lower-inner quadrant of female breast Right breast DCIS diagnosed in July 2013 treated with lumpectomy. Patient is currently on antiestrogen therapy with tamoxifen that started September 2013. Tolerating it very well without any major problems or  concerns. I reviewed the mammogram report from August 2015 Which was normal. Breast exam today did not reveal any abnormalities either. I recommended continued followup with annual mammograms and physical exams.  Discussed the importance of physical exercise in decreasing the likelihood of breast cancer recurrence. Recommended 30 mins daily 6 days a week of either brisk walking or cycling or swimming. Encouraged patient to eat more fruits and vegetables and decrease red meat.   Microcytic anemia Iron deficiency anemia with no evidence of any GI blood loss: She has an appointment for gastroenterology consultation with Dr. Collene Mares. Even though I asked her to increase her oral iron to twice a day patient has not been taking it twice she only takes it once a day.  She reports no complaints of fatigue or shortness of breath. So I will leave her on once a day and await the iron studies that were done today. Return to clinic in 3 months with recheck of CBC and along with that iron studies as well.   lung nodules: CTs were reviewed patient will be followed by pulmonary  Orders Placed This Encounter  Procedures  . CBC with Differential    Standing Status: Future     Number of Occurrences:      Standing Expiration Date: 11/12/2014  . Ferritin    Standing Status: Future     Number of Occurrences:      Standing Expiration Date: 11/12/2014  . Iron and TIBC [facility]    Standing Status: Future     Number of Occurrences:      Standing Expiration Date: 11/12/2014  . Vitamin B12    Standing Status: Future     Number of Occurrences:      Standing Expiration Date: 11/12/2014   The patient has a good understanding of the overall plan. she agrees with it. She will call with any problems that may develop before her next visit here.  I spent 25 minutes counseling the patient face to face. The total time spent in the appointment was 30 minutes and more than 50% was on counseling and review of test results    Rulon Eisenmenger, MD 11/12/2013 11:34 AM

## 2013-11-12 NOTE — Assessment & Plan Note (Signed)
Iron deficiency anemia with no evidence of any GI blood loss: She has an appointment for gastroenterology consultation with Dr. Collene Mares. Even though I asked her to increase her oral iron to twice a day patient has not been taking it twice she only takes it once a day. She reports no complaints of fatigue or shortness of breath. So I will leave her on once a day and await the iron studies that were done today. Return to clinic in 3 months with recheck of CBC and along with that iron studies as well.

## 2013-11-12 NOTE — Assessment & Plan Note (Signed)
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 Patient is currently on antiestrogen therapy with tamoxifen that started September 2013. Tolerating it very well without any major problems or concerns. I reviewed the mammogram report from August 2015 Which was normal. Breast exam today did not reveal any abnormalities either. I recommended continued followup with annual mammograms and physical exams.  Discussed the importance of physical exercise in decreasing the likelihood of breast cancer recurrence. Recommended 30 mins daily 6 days a week of either brisk walking or cycling or swimming. Encouraged patient to eat more fruits and vegetables and decrease red meat.

## 2013-11-26 ENCOUNTER — Other Ambulatory Visit: Payer: Self-pay | Admitting: Hematology and Oncology

## 2013-12-01 ENCOUNTER — Other Ambulatory Visit: Payer: Self-pay | Admitting: *Deleted

## 2013-12-01 DIAGNOSIS — C50311 Malignant neoplasm of lower-inner quadrant of right female breast: Secondary | ICD-10-CM

## 2013-12-01 MED ORDER — TAMOXIFEN CITRATE 20 MG PO TABS: 20.0000 mg | ORAL_TABLET | Freq: Every day | ORAL | Status: DC

## 2013-12-27 ENCOUNTER — Ambulatory Visit (INDEPENDENT_AMBULATORY_CARE_PROVIDER_SITE_OTHER): Payer: Commercial Managed Care - HMO | Admitting: Infectious Diseases

## 2013-12-27 ENCOUNTER — Encounter: Payer: Self-pay | Admitting: Infectious Diseases

## 2013-12-27 VITALS — BP 153/73 | HR 99 | Temp 97.7°F | Wt 108.0 lb

## 2013-12-27 DIAGNOSIS — J189 Pneumonia, unspecified organism: Secondary | ICD-10-CM

## 2013-12-27 DIAGNOSIS — J984 Other disorders of lung: Secondary | ICD-10-CM

## 2013-12-27 NOTE — Progress Notes (Signed)
   Subjective:    Patient ID: Brianna Hunter, female    DOB: 1928-10-27, 80 y.o.   MRN: [id number]  HPI 80 yo F with hx of HTN, Breast CA (2013), brought to [facility] on 5-1 with back pain, found to be tachycardic, febrile, WBC of 26.4 and to have a cavitary pneumonia. This was further eval with CT showing necrotizing pneumonia. Upon further questioning, states that her dad had Tb. She has never been tested for TB.  She had sputum cytology (-), sputum AFB (-) x3. She was d/c home on 5-7 and completed 21 days of anbx (augmentin).  She had an indeterminant quantiferon in the hospital.   She had repeat CT on 08-19-13: 1. Posterior right upper lobe cavitary lesion today measures 67 mm x 38 mm.  2. Resolution of right pleural effusion and decreased airspace  disease around the cavitary lesion suggests response the treatment.  Persistent fluid level within the cavitary portion.  3. Borderline right hilar adenopathy is probably reactive.  4. Continued follow-up to ensure resolution and expected evolution  to scarring is recommended.  She had ID f/u June 2015 and was having trouble with weight loss (down to 110). She only partially completed her augmentin and had po cephalosoprin to complete her course.  She was seen in pulmonary clinic 10-2013 and was felt to be doing well. She had repeat CT scan on 10-2013 showing lesion was now 5.3 x 3.3.   Has been feeling well, has occasional cough (she attributes to her air conditioner). No sputum. No SOB. No fever or chills but states she feels cold all the time. Occas LE edema.  States her wt is going up (108-111).   Has gotten flu shot.   Review of Systems  Constitutional: Negative for fever, chills, appetite change and unexpected weight change.  Respiratory: Positive for cough. Negative for shortness of breath.   Cardiovascular: Positive for leg swelling.       Objective:   Physical Exam  Constitutional: She appears well-developed and well-nourished.    HENT:  Mouth/Throat: Abnormal dentition. Dental caries present. No oropharyngeal exudate.  Eyes:  L eye opaque  Neck: Neck supple.  Cardiovascular: Normal rate, regular rhythm and normal heart sounds.   Pulmonary/Chest: Effort normal. She has wheezes in the right upper field.  Abdominal: Soft. Bowel sounds are normal. She exhibits no distension. There is no tenderness.  Musculoskeletal: She exhibits edema.  Trace 1-2, R > L.   Lymphadenopathy:    She has no cervical adenopathy.  Psychiatric:  3/3 immediate.  1/3 after 5 minutes She is oriented to place, person, not completely clear why she is here.           Assessment & Plan:

## 2013-12-27 NOTE — Assessment & Plan Note (Signed)
See CAP a/p.

## 2013-12-27 NOTE — Assessment & Plan Note (Addendum)
She is doing about the same. She has not had sx that would suggest that she has TB- her wt is stable, she minimal cough (she did not cough during exam), she has no LAN, she her CT has shown improvement.  Would ask that she follows up with her her PCP and consider repeat CT chest in 6-12 months.  I believe she also has pulm f/u in the near future.  If she has any change in her sx I would be happy to see her back.

## 2014-02-14 ENCOUNTER — Telehealth: Payer: Self-pay | Admitting: Hematology and Oncology

## 2014-02-14 ENCOUNTER — Other Ambulatory Visit (HOSPITAL_BASED_OUTPATIENT_CLINIC_OR_DEPARTMENT_OTHER): Payer: Commercial Managed Care - HMO

## 2014-02-14 ENCOUNTER — Ambulatory Visit (HOSPITAL_BASED_OUTPATIENT_CLINIC_OR_DEPARTMENT_OTHER): Payer: Commercial Managed Care - HMO | Admitting: Hematology and Oncology

## 2014-02-14 VITALS — BP 150/65 | HR 98 | Temp 97.5°F | Resp 18 | Ht <= 58 in | Wt 110.9 lb

## 2014-02-14 DIAGNOSIS — Z79818 Long term (current) use of other agents affecting estrogen receptors and estrogen levels: Secondary | ICD-10-CM

## 2014-02-14 DIAGNOSIS — C50311 Malignant neoplasm of lower-inner quadrant of right female breast: Secondary | ICD-10-CM

## 2014-02-14 DIAGNOSIS — Z853 Personal history of malignant neoplasm of breast: Secondary | ICD-10-CM

## 2014-02-14 DIAGNOSIS — D509 Iron deficiency anemia, unspecified: Secondary | ICD-10-CM

## 2014-02-14 LAB — CBC WITH DIFFERENTIAL/PLATELET
BASO%: 0.8 % (ref 0.0–2.0)
Basophils Absolute: 0 10*3/uL (ref 0.0–0.1)
EOS ABS: 0.1 10*3/uL (ref 0.0–0.5)
EOS%: 1.7 % (ref 0.0–7.0)
HEMATOCRIT: 38 % (ref 34.8–46.6)
HGB: 11.4 g/dL — ABNORMAL LOW (ref 11.6–15.9)
LYMPH#: 1.3 10*3/uL (ref 0.9–3.3)
LYMPH%: 35 % (ref 14.0–49.7)
MCH: 22 pg — ABNORMAL LOW (ref 25.1–34.0)
MCHC: 30.1 g/dL — AB (ref 31.5–36.0)
MCV: 73.1 fL — ABNORMAL LOW (ref 79.5–101.0)
MONO#: 0.6 10*3/uL (ref 0.1–0.9)
MONO%: 16.7 % — ABNORMAL HIGH (ref 0.0–14.0)
NEUT%: 45.8 % (ref 38.4–76.8)
NEUTROS ABS: 1.6 10*3/uL (ref 1.5–6.5)
Platelets: 128 10*3/uL — ABNORMAL LOW (ref 145–400)
RBC: 5.21 10*6/uL (ref 3.70–5.45)
RDW: 14.8 % — ABNORMAL HIGH (ref 11.2–14.5)
WBC: 3.6 10*3/uL — AB (ref 3.9–10.3)

## 2014-02-14 LAB — IRON AND TIBC CHCC
%SAT: 26 % (ref 21–57)
Iron: 76 ug/dL (ref 41–142)
TIBC: 298 ug/dL (ref 236–444)
UIBC: 221 ug/dL (ref 120–384)

## 2014-02-14 LAB — FERRITIN CHCC: FERRITIN: 483 ng/mL — AB (ref 9–269)

## 2014-02-14 LAB — VITAMIN B12: Vitamin B-12: 944 pg/mL — ABNORMAL HIGH (ref 211–911)

## 2014-02-14 NOTE — Progress Notes (Signed)
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   Staging form: Breast, AJCC 7th Edition     Clinical: Stage 0 (Tis (DCIS), N0, cM0) - Unsigned       Staging comments: Staged at breast conference 7.31.13      Pathologic: No stage assigned - Unsigned   SUMMARY OF ONCOLOGIC HISTORY:   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   10/02/2011 Initial Biopsy Low-grade DCIS arising in a background of papilloma ER 100%, PR 100%   11/07/2011 Surgery Right breast lumpectomy DCIS intermediate grade 0.4 cm   11/29/2011 -  Anti-estrogen oral therapy Tamoxifen 20 mg daily    CHIEF COMPLIANT: followup of anemia   INTERVAL HISTORY: Brianna Hunter is a 80 year old American lady with above-mentioned history of DCIS involving right breast treated with lumpectomy and is currently on tamoxifen. She was also found to have microcytic anemia for which she is on oral iron therapy. She has not seen gastroenterologist and apparently she may have canceled it unknown daily. She was recently treated for pneumonia and is being worked up pulmonology and her primary care physician. She is worried about not gaining weight.  REVIEW OF SYSTEMS:   Constitutional: Denies fevers, chills or abnormal weight loss Eyes: left eye vision blurry Ears, nose, mouth, throat, and face: Denies mucositis or sore throat Respiratory: Denies cough, dyspnea or wheezes Cardiovascular: Denies palpitation, chest discomfort or lower extremity swelling Gastrointestinal:  Denies nausea, heartburn or change in bowel habits Skin: Denies abnormal skin rashes Lymphatics: Denies new lymphadenopathy or easy bruising Neurological:Denies numbness, tingling or new weaknesses Behavioral/Psych: Mood is stable, no new changes  Breast:  denies any pain or lumps or nodules in either breasts All other systems were reviewed with the patient and  are negative.  I have reviewed the past medical history, past surgical history, social history and family history with the patient and they are unchanged from previous note.  ALLERGIES:  is allergic to milk-related compounds and augmentin.  MEDICATIONS:  Current Outpatient Prescriptions  Medication Sig Dispense Refill  . aspirin 81 MG EC tablet Take 81 mg by mouth daily.      . celecoxib (CELEBREX) 200 MG capsule Take 200 mg by mouth at bedtime.     Marland Kitchen EAR WAX REMOVAL DROPS 6.5 % otic solution Place 5 drops into both ears 2 (two) times daily as needed (for ears).     . feeding supplement, RESOURCE BREEZE, (RESOURCE BREEZE) LIQD Take 1 Container by mouth 2 (two) times daily between meals.    . ferrous gluconate (FERGON) 325 MG tablet Take 325 mg by mouth daily with breakfast.    . LUMIGAN 0.01 % SOLN Place 1 drop into the right eye at bedtime.     . Multiple Vitamin (MULTIVITAMIN) tablet Take 1 tablet by mouth daily.      . tamoxifen (NOLVADEX) 20 MG tablet Take 1 tablet (20 mg total) by mouth daily. 30 tablet 12  . Vitamins A & D (VITAMIN A & D) 10000-400 UNITS CAPS Take 1 capsule by mouth daily.      No current facility-administered medications for this visit.    PHYSICAL EXAMINATION: ECOG PERFORMANCE STATUS: 1 - Symptomatic but completely ambulatory  Filed Vitals:   02/14/14 1028  BP: 150/65  Pulse: 98  Temp: 97.5 F (36.4 C)  Resp: 18   Filed Weights   02/14/14 1028  Weight: 110 lb  14.4 oz (50.304 kg)    GENERAL:alert, no distress and comfortable SKIN: skin color, texture, turgor are normal, no rashes or significant lesions EYES: normal, Conjunctiva are pink ; left eye opaque  OROPHARYNX:no exudate, no erythema and lips, buccal mucosa, and tongue normal  NECK: supple, thyroid normal size, non-tender, without nodularity LYMPH:  no palpable lymphadenopathy in the cervical, axillary or inguinal LUNGS: clear to auscultation and percussion with normal breathing effort HEART:  regular rate & rhythm and no murmurs and no lower extremity edema ABDOMEN:abdomen soft, non-tender and normal bowel sounds Musculoskeletal:no cyanosis of digits and no clubbing  NEURO: alert & oriented x 3 with fluent speech, no focal motor/sensory deficits  LABORATORY DATA:  I have reviewed the data as listed   Chemistry      Component Value Date/Time   NA 141 10/15/2013 1342   NA 143 07/14/2013 0524   K 4.6 10/15/2013 1342   K 3.6* 07/14/2013 0524   CL 108 07/14/2013 0524   CL 107 08/19/2012 1026   CO2 27 10/15/2013 1342   CO2 27 07/14/2013 0524   BUN 17.0 10/15/2013 1342   BUN 15 07/14/2013 0524   CREATININE 0.9 10/15/2013 1342   CREATININE 0.65 07/14/2013 0524      Component Value Date/Time   CALCIUM 9.5 10/15/2013 1342   CALCIUM 8.7 07/14/2013 0524   ALKPHOS 40 10/15/2013 1342   ALKPHOS 84 07/10/2013 0850   AST 13 10/15/2013 1342   AST 27 07/10/2013 0850   ALT 7 10/15/2013 1342   ALT 10 07/10/2013 0850   BILITOT 0.41 10/15/2013 1342   BILITOT 0.4 07/10/2013 0850       Lab Results  Component Value Date   WBC 3.6* 02/14/2014   HGB 11.4* 02/14/2014   HCT 38.0 02/14/2014   MCV 73.1* 02/14/2014   PLT 128* 02/14/2014   NEUTROABS 1.6 02/14/2014   ASSESSMENT & PLAN:  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 Patient is currently on antiestrogen therapy with tamoxifen that started September 2013. Tolerating it very well without any major problems or concerns. I reviewed the mammogram report from August 2015 Which was normal  From the DCIS standpoint, patient is doing very well. We will continue with annual surveillance mammograms and physical exams  Microcytic anemia Microcytic anemia: Iron studies were done today and the results are pending. Patient had been instructed to increase her oral iron to twice a day but she decided not to increase it. Today her hemoglobin is up to 11.4 g.  Previously it was 9-10 g. It is unclear how she improved so much better she reports that she has been taking oral B12 supplementation over-the-counter. She has not seen a gastroenterologist. Since she responded so well, I instructed her to continue to take the iron and B12 as she is doing currently and then see her back in 6 months for clinic follow up.  Patient continues to be worried about weight loss. It appears that she has been drinking high-calorie nutrient supplementation.    Orders Placed This Encounter  Procedures  . CBC with Differential    Standing Status: Future     Number of Occurrences:      Standing Expiration Date: 02/14/2015  . Ferritin    Standing Status: Future     Number of Occurrences:      Standing Expiration Date: 02/14/2015  . Iron and TIBC [facility]    Standing Status: Future  Number of Occurrences:      Standing Expiration Date: 02/14/2015  . Folate, Serum    Standing Status: Future     Number of Occurrences:      Standing Expiration Date: 02/14/2015  . Vitamin B12    Standing Status: Future     Number of Occurrences:      Standing Expiration Date: 02/14/2015   The patient has a good understanding of the overall plan. she agrees with it. She will call with any problems that may develop before her next visit here.   Rulon Eisenmenger, MD 02/14/2014 11:10 AM

## 2014-02-14 NOTE — Assessment & Plan Note (Signed)
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 Patient is currently on antiestrogen therapy with tamoxifen that started September 2013. Tolerating it very well without any major problems or concerns. I reviewed the mammogram report from August 2015 Which was normal  From the DCIS standpoint, patient is doing very well. We will continue with annual surveillance mammograms and physical exams

## 2014-02-14 NOTE — Assessment & Plan Note (Signed)
Microcytic anemia: Iron studies were done today and the results are pending. Patient had been instructed to increase her oral iron to twice a day but she decided not to increase it. Today her hemoglobin is up to 11.4 g. Previously it was 9-10 g. It is unclear how she improved so much better she reports that she has been taking oral B12 supplementation over-the-counter. She has not seen a gastroenterologist. Since she responded so well, I instructed her to continue to take the iron and B12 as she is doing currently and then see her back in 6 months for clinic follow up.  Patient continues to be worried about weight loss. It appears that she has been drinking high-calorie nutrient supplementation.

## 2014-02-14 NOTE — Telephone Encounter (Signed)
per pof to sch pt appt-gave pt copy of sch °

## 2014-03-16 DIAGNOSIS — H4011X1 Primary open-angle glaucoma, mild stage: Secondary | ICD-10-CM | POA: Diagnosis not present

## 2014-03-16 DIAGNOSIS — H18233 Secondary corneal edema, bilateral: Secondary | ICD-10-CM | POA: Diagnosis not present

## 2014-03-28 ENCOUNTER — Emergency Department (HOSPITAL_COMMUNITY)
Admission: EM | Admit: 2014-03-28 | Discharge: 2014-03-28 | Disposition: A | Discharge status: Home / Self Care | Payer: Medicare HMO | Attending: Emergency Medicine | Admitting: Emergency Medicine

## 2014-03-28 ENCOUNTER — Emergency Department (HOSPITAL_COMMUNITY): Payer: Medicare HMO

## 2014-03-28 ENCOUNTER — Encounter (HOSPITAL_COMMUNITY): Payer: Self-pay

## 2014-03-28 DIAGNOSIS — Z853 Personal history of malignant neoplasm of breast: Secondary | ICD-10-CM | POA: Diagnosis not present

## 2014-03-28 DIAGNOSIS — Z9889 Other specified postprocedural states: Secondary | ICD-10-CM | POA: Insufficient documentation

## 2014-03-28 DIAGNOSIS — Z7982 Long term (current) use of aspirin: Secondary | ICD-10-CM | POA: Insufficient documentation

## 2014-03-28 DIAGNOSIS — S4992XA Unspecified injury of left shoulder and upper arm, initial encounter: Secondary | ICD-10-CM | POA: Diagnosis not present

## 2014-03-28 DIAGNOSIS — F419 Anxiety disorder, unspecified: Secondary | ICD-10-CM | POA: Diagnosis not present

## 2014-03-28 DIAGNOSIS — Z791 Long term (current) use of non-steroidal anti-inflammatories (NSAID): Secondary | ICD-10-CM | POA: Insufficient documentation

## 2014-03-28 DIAGNOSIS — W01198A Fall on same level from slipping, tripping and stumbling with subsequent striking against other object, initial encounter: Secondary | ICD-10-CM | POA: Insufficient documentation

## 2014-03-28 DIAGNOSIS — S01111A Laceration without foreign body of right eyelid and periocular area, initial encounter: Secondary | ICD-10-CM | POA: Diagnosis not present

## 2014-03-28 DIAGNOSIS — Z79899 Other long term (current) drug therapy: Secondary | ICD-10-CM | POA: Insufficient documentation

## 2014-03-28 DIAGNOSIS — S0990XA Unspecified injury of head, initial encounter: Secondary | ICD-10-CM | POA: Diagnosis present

## 2014-03-28 DIAGNOSIS — I10 Essential (primary) hypertension: Secondary | ICD-10-CM | POA: Diagnosis not present

## 2014-03-28 DIAGNOSIS — S01112A Laceration without foreign body of left eyelid and periocular area, initial encounter: Secondary | ICD-10-CM | POA: Diagnosis not present

## 2014-03-28 DIAGNOSIS — S0010XA Contusion of unspecified eyelid and periocular area, initial encounter: Secondary | ICD-10-CM | POA: Diagnosis not present

## 2014-03-28 DIAGNOSIS — Y998 Other external cause status: Secondary | ICD-10-CM | POA: Insufficient documentation

## 2014-03-28 DIAGNOSIS — T1490XA Injury, unspecified, initial encounter: Secondary | ICD-10-CM

## 2014-03-28 DIAGNOSIS — Y9389 Activity, other specified: Secondary | ICD-10-CM | POA: Diagnosis not present

## 2014-03-28 DIAGNOSIS — Y9289 Other specified places as the place of occurrence of the external cause: Secondary | ICD-10-CM | POA: Insufficient documentation

## 2014-03-28 DIAGNOSIS — S40212A Abrasion of left shoulder, initial encounter: Secondary | ICD-10-CM | POA: Diagnosis not present

## 2014-03-28 DIAGNOSIS — S0181XA Laceration without foreign body of other part of head, initial encounter: Secondary | ICD-10-CM

## 2014-03-28 DIAGNOSIS — M1990 Unspecified osteoarthritis, unspecified site: Secondary | ICD-10-CM | POA: Insufficient documentation

## 2014-03-28 DIAGNOSIS — Z8639 Personal history of other endocrine, nutritional and metabolic disease: Secondary | ICD-10-CM | POA: Insufficient documentation

## 2014-03-28 DIAGNOSIS — S098XXA Other specified injuries of head, initial encounter: Secondary | ICD-10-CM | POA: Diagnosis not present

## 2014-03-28 LAB — I-STAT CHEM 8, ED
BUN: 28 mg/dL — ABNORMAL HIGH (ref 6–23)
CALCIUM ION: 1.14 mmol/L (ref 1.13–1.30)
Chloride: 108 mEq/L (ref 96–112)
Creatinine, Ser: 0.9 mg/dL (ref 0.50–1.10)
Glucose, Bld: 88 mg/dL (ref 70–99)
HCT: 43 % (ref 36.0–46.0)
HEMOGLOBIN: 14.6 g/dL (ref 12.0–15.0)
POTASSIUM: 4.3 mmol/L (ref 3.5–5.1)
Sodium: 141 mmol/L (ref 135–145)
TCO2: 19 mmol/L (ref 0–100)

## 2014-03-28 LAB — URINALYSIS, ROUTINE W REFLEX MICROSCOPIC
Bilirubin Urine: NEGATIVE
Glucose, UA: NEGATIVE mg/dL
KETONES UR: NEGATIVE mg/dL
LEUKOCYTES UA: NEGATIVE
Nitrite: NEGATIVE
PROTEIN: NEGATIVE mg/dL
SPECIFIC GRAVITY, URINE: 1.017 (ref 1.005–1.030)
Urobilinogen, UA: 0.2 mg/dL (ref 0.0–1.0)
pH: 5.5 (ref 5.0–8.0)

## 2014-03-28 LAB — URINE MICROSCOPIC-ADD ON

## 2014-03-28 MED ORDER — LIDOCAINE-EPINEPHRINE 1 %-1:100000 IJ SOLN
INTRAMUSCULAR | Status: AC
  Administered 2014-03-28: 1 mL
  Filled 2014-03-28: qty 1

## 2014-03-28 NOTE — ED Notes (Signed)
Per EMS- Patient was coming out of [employer] and fell forward, hitting her head on concrete. Patient has a laceration to the left eyebrow area and inside left upper lip. Abrasion to left upper present also. Patient denies any LOC.

## 2014-03-28 NOTE — Discharge Instructions (Signed)
Facial Laceration  A facial laceration is a cut on the face. These injuries can be painful and cause bleeding. Lacerations usually heal quickly, but they need special care to reduce scarring. DIAGNOSIS  Your health care provider will take a medical history, ask for details about how the injury occurred, and examine the wound to determine how deep the cut is. TREATMENT  Some facial lacerations may not require closure. Others may not be able to be closed because of an increased risk of infection. The risk of infection and the chance for successful closure will depend on various factors, including the amount of time since the injury occurred. The wound may be cleaned to help prevent infection. If closure is appropriate, pain medicines may be given if needed. Your health care provider will use stitches (sutures), wound glue (adhesive), or skin adhesive strips to repair the laceration. These tools bring the skin edges together to allow for faster healing and a better cosmetic outcome. If needed, you may also be given a tetanus shot. HOME CARE INSTRUCTIONS  Only take over-the-counter or prescription medicines as directed by your health care provider.  Follow your health care provider's instructions for wound care. These instructions will vary depending on the technique used for closing the wound. For Sutures:  Keep the wound clean and dry.   If you were given a bandage (dressing), you should change it at least once a day. Also change the dressing if it becomes wet or dirty, or as directed by your health care provider.   Wash the wound with soap and water 2 times a day. Rinse the wound off with water to remove all soap. Pat the wound dry with a clean towel.   After cleaning, apply a thin layer of the antibiotic ointment recommended by your health care provider. This will help prevent infection and keep the dressing from sticking.   You may shower as usual after the first 24 hours. Do not soak the  wound in water until the sutures are removed.   Get your sutures removed as directed by your health care provider. With facial lacerations, sutures should usually be taken out after 4-5 days to avoid stitch marks.   Wait a few days after your sutures are removed before applying any makeup. For Skin Adhesive Strips:  Keep the wound clean and dry.   Do not get the skin adhesive strips wet. You may bathe carefully, using caution to keep the wound dry.   If the wound gets wet, pat it dry with a clean towel.   Skin adhesive strips will fall off on their own. You may trim the strips as the wound heals. Do not remove skin adhesive strips that are still stuck to the wound. They will fall off in time.  For Wound Adhesive:  You may briefly wet your wound in the shower or bath. Do not soak or scrub the wound. Do not swim. Avoid periods of heavy sweating until the skin adhesive has fallen off on its own. After showering or bathing, gently pat the wound dry with a clean towel.   Do not apply liquid medicine, cream medicine, ointment medicine, or makeup to your wound while the skin adhesive is in place. This may loosen the film before your wound is healed.   If a dressing is placed over the wound, be careful not to apply tape directly over the skin adhesive. This may cause the adhesive to be pulled off before the wound is healed.   Avoid   prolonged exposure to sunlight or tanning lamps while the skin adhesive is in place.  The skin adhesive will usually remain in place for 5-10 days, then naturally fall off the skin. Do not pick at the adhesive film.  After Healing: Once the wound has healed, cover the wound with sunscreen during the day for 1 full year. This can help minimize scarring. Exposure to ultraviolet light in the first year will darken the scar. It can take 1-2 years for the scar to lose its redness and to heal completely.  SEEK IMMEDIATE MEDICAL CARE IF:  You have redness, pain, or  swelling around the wound.   You see ayellowish-white fluid (pus) coming from the wound.   You have chills or a fever.  MAKE SURE YOU:  Understand these instructions.  Will watch your condition.  Will get help right away if you are not doing well or get worse. Document Released: 04/04/2004 Document Revised: 12/16/2012 Document Reviewed: 10/08/2012 ExitCare Patient Information 2015 [employer]. This information is not intended to replace advice given to you by your health care provider. Make sure you discuss any questions you have with your health care provider.  

## 2014-03-28 NOTE — ED Provider Notes (Signed)
CSN: [id number]     Arrival date & time 03/28/14  1319 History   First MD Initiated Contact with Patient 03/28/14 1411     Chief Complaint  Patient presents with  . Fall     (Consider location/radiation/quality/duration/timing/severity/associated sxs/prior Treatment) HPI Comments: Patient here after becoming dizzy and falling and striking the left side of her face. No loss of consciousness. Denies any use of blood thinners. States that she took an extra dose of cough medication prior to this and thinks that this may be the reason why she fell. Denies any palpitations or chest pain prior to this. Denies any neck pain. No upper or lower extremity weakness. No recent illnesses. Does have some bleeding from her left forehead. She does try concrete. Does complain of some left shoulder pain. No abdominal pain  Patient is a 81 y.o. female presenting with fall. The history is provided by the patient.  Fall    Past Medical History  Diagnosis Date  . Rheumatism   . Back pain   . Hypertension   . Palpitations     a. holter (7/11):  isolated PVCs and PACs  . Breast cancer     a. DCIS s/p R lumpectomy  . Anxiety   . Arthritis     celebrex use off & on for arthritis- hands, back   . Valvular heart disease     a. Echo (7/11):  EF 55-60%, Gr 1 DD, mild-mod AI, mild-mod MR, mod PI, PASP 30;  b. Echo (09/2010):  Mild LVH, EF 60%, Gr 1 DD, mild to mod AI, mild MR, mild TR, mild to mod PI, PASP 36.;   c.  Echo (05/2013):  EF 60-65%, no RWMA, mild AI, mild to mod PI   . Bifascicular block   . Hyperlipidemia    Past Surgical History  Procedure Laterality Date  . Knee surgery      Left  . Shoulder surgery      Left  . Joint replacement      2008- L knee  . Appendectomy    . Abdominal hysterectomy      partial   . Eye surgery      left eye 2013  . Ear cyst excision  11/07/2011    Procedure: CYST REMOVAL;  Surgeon: Madilyn Hook, DO;  Location: [facility];  Service: General;  Laterality: Right;   Removal Cyst Right Upper Chest   Family History  Problem Relation Age of Onset  . Diabetes Mother   . Other Mother     Bleeding disorder  . Diabetes Father   . Other Father     Bleeding disorder   History  Substance Use Topics  . Smoking status: Never Smoker   . Smokeless tobacco: Never Used  . Alcohol Use: No   OB History    No data available     Review of Systems  All other systems reviewed and are negative.     Allergies  Milk-related compounds and Augmentin  Home Medications   Prior to Admission medications   Medication Sig Start Date End Date Taking? Authorizing Provider  ALPRAZolam Duanne Moron) 0.25 MG tablet Take 1 tablet by mouth 3 (three) times daily. 02/23/14  Yes Historical Provider, MD  aspirin 81 MG EC tablet Take 81 mg by mouth daily.     Yes Historical Provider, MD  celecoxib (CELEBREX) 200 MG capsule Take 200 mg by mouth at bedtime.    Yes Historical Provider, MD  Cyanocobalamin (VITAMIN B-12 PO) Take  1 tablet by mouth daily.   Yes Historical Provider, MD  EAR WAX REMOVAL DROPS 6.5 % otic solution Place 5 drops into both ears 2 (two) times daily as needed (for ears).  06/10/13  Yes Historical Provider, MD  ferrous gluconate (FERGON) 325 MG tablet Take 325 mg by mouth daily with breakfast.   Yes Historical Provider, MD  LUMIGAN 0.01 % SOLN Place 1 drop into the right eye at bedtime.  07/25/12  Yes Historical Provider, MD  tamoxifen (NOLVADEX) 20 MG tablet Take 1 tablet (20 mg total) by mouth daily. 12/01/13  Yes Rulon Eisenmenger, MD  TRIBENZOR 40-5-25 MG TABS Take 1 tablet by mouth daily. 03/02/14  Yes Historical Provider, MD  feeding supplement, RESOURCE BREEZE, (RESOURCE BREEZE) LIQD Take 1 Container by mouth 2 (two) times daily between meals. Patient not taking: Reported on 03/28/2014 07/15/13   Modena Jansky, MD   BP 141/79 mmHg  Pulse 86  Temp(Src) 97.4 F (36.3 C) (Oral)  Resp 17  SpO2 100% Physical Exam  Constitutional: She is oriented to person, place,  and time. She appears well-developed and well-nourished.  Non-toxic appearance. No distress.  HENT:  Head: Normocephalic and atraumatic.    Eyes: Conjunctivae, EOM and lids are normal. Pupils are equal, round, and reactive to light.  Neck: Normal range of motion. Neck supple. No spinous process tenderness and no muscular tenderness present. No tracheal deviation and normal range of motion present. No thyroid mass present.  Cardiovascular: Normal rate, regular rhythm and normal heart sounds.  Exam reveals no gallop.   No murmur heard. Pulmonary/Chest: Effort normal and breath sounds normal. No stridor. No respiratory distress. She has no decreased breath sounds. She has no wheezes. She has no rhonchi. She has no rales.  Abdominal: Soft. Normal appearance and bowel sounds are normal. She exhibits no distension. There is no tenderness. There is no rebound and no CVA tenderness.  Musculoskeletal: Normal range of motion. She exhibits no edema or tenderness.       Arms: Full range of motion. No deformity noted. Neurovascular intact distally  Neurological: She is alert and oriented to person, place, and time. She has normal strength. No cranial nerve deficit or sensory deficit. GCS eye subscore is 4. GCS verbal subscore is 5. GCS motor subscore is 6.  Skin: Skin is warm and dry. No abrasion and no rash noted.  Psychiatric: She has a normal mood and affect. Her speech is normal and behavior is normal.  Nursing note and vitals reviewed.   ED Course  Procedures (including critical care time) Labs Review Labs Reviewed  URINALYSIS, ROUTINE W REFLEX MICROSCOPIC  I-STAT CHEM 8, ED    Imaging Review No results found.   EKG Interpretation   Date/Time:  Monday March 28 2014 13:28:29 EST Ventricular Rate:  83 PR Interval:  153 QRS Duration: 135 QT Interval:  411 QTC Calculation: 483 R Axis:   -73 Text Interpretation:  Sinus rhythm RBBB and LAFB No significant change  since last tracing  Confirmed by Iyesha Such  MD, Staci Dack ([id number]) on 03/28/2014  3:20:03 PM      MDM   Final diagnoses:  Trauma   Patient with likely vagal episode as cause of her falling down. She feels at her baseline at this time. Denies any cardiac type chest pain. Stable for discharge LACERATION REPAIR Performed by: Leota Jacobsen Authorized by: Leota Jacobsen Consent: Verbal consent obtained. Risks and benefits: risks, benefits and alternatives were discussed Consent given by: patient Patient  identity confirmed: provided demographic data Prepped and Draped in normal sterile fashion Wound explored  Laceration Location: left eyebrow  Laceration Length:2 cm  No Foreign Bodies seen or palpated  Anesthesia: local infiltration  Local anesthetic: lidocaine 2%w/epiepinephrine  Anesthetic total: 6 ml  Irrigation method: syringe Amount of cleaning: standard  Skin closure: simple  Number of sutures: 4  Technique: simple interrupted  Patient tolerance: Patient tolerated the procedure well with no immediate complications.    Leota Jacobsen, MD 03/28/14 [phone]

## 2014-03-28 NOTE — ED Notes (Signed)
Patient reports that she took 10 ml of a cough syrup and thinks the cough syrup may have made her dizzy.

## 2014-03-28 NOTE — Progress Notes (Addendum)
CSW met with patient at bedside. There was no family present. Patient confirms that she fell today. Patient states she was on her way to [place] and fell while walking. Patient informed CSW that she is able to complete her ADL's independently. Patient stated " I don't feel bad. I just feel my age."  Patient informed CSW that she is able to ambulate well. Patient states she is not interested in Home health right not, but would like someone to come and help her clean.  Patient states that she lives alone in [place]. Patient informed CSW that she feels as though she has a good support system. According to patient, her daughter visits her at least once a week. Also, patient states that her daughter takes her to the grocery store as needed.  Patient informed CSW that last year she had pneumonia. Patient has a laceration to the left eyebrow area an inside left upper lip.   Patient appeared to be bleeding.   Willette Brace [phone] ED CSW 03/28/2014 5:13 PM

## 2014-03-28 NOTE — ED Notes (Signed)
Patient transported to Radiology 

## 2014-03-28 NOTE — ED Notes (Signed)
Bed: WA11 Expected date:  Expected time:  Means of arrival:  Comments: ems 

## 2014-04-11 ENCOUNTER — Telehealth: Payer: Self-pay | Admitting: Hematology and Oncology

## 2014-04-11 NOTE — Telephone Encounter (Signed)
due to PAL moved 6/14 appt to 6/24. s/w pt she is aware.

## 2014-05-04 ENCOUNTER — Ambulatory Visit (INDEPENDENT_AMBULATORY_CARE_PROVIDER_SITE_OTHER): Payer: Commercial Managed Care - HMO | Admitting: Cardiology

## 2014-05-04 ENCOUNTER — Encounter: Payer: Self-pay | Admitting: Cardiology

## 2014-05-04 VITALS — BP 128/68 | HR 94 | Ht <= 58 in | Wt 114.0 lb

## 2014-05-04 DIAGNOSIS — I38 Endocarditis, valve unspecified: Secondary | ICD-10-CM | POA: Diagnosis not present

## 2014-05-04 NOTE — Patient Instructions (Signed)
Your physician wants you to follow-up in: 1 year with Dr Aundra Dubin. (February 2017). You will receive a reminder letter in the mail two months in advance. If you don't receive a letter, please call our office to schedule the follow-up appointment.   Dr Aundra Dubin recommends that you use your cane regularly.

## 2014-05-04 NOTE — Progress Notes (Signed)
Patient ID: Brianna Hunter, female   DOB: [birth year], 81 y.o.   MRN: [id number] PCP: Dr. Criss Rosales  81 yo with history of valvular heart disease presents for cardiology followup.  She was initially seen for murmur on exam.  Echo in 7/11 showed mild to moderate aortic insufficiency, mild to moderate mitral regurgitation, and moderate pulmonic insufficiency. Last echo in 3/15 was improved with EF 60-65% and mild aortic insufficiency.  She has been doing well.  No chest pain.  She is mildly short of breath if she walks too fast.  She lives in an assisted living [facility]).  Occasional dizzy spells with one fall last month.  She does all her ADLs (cooks, cleans).  She has rare palpitations.    Of note, she developed a cavitary pnemonia in 5/15 and was followed by ID.  It was not TB.   ECG: NSR, RBBB  Labs (6/12): K 4.4, creatinine 0.65 Labs (1/16): K 4.3, creatinine 0.9  PMH:  1. HTN 2. Hyperlipidemia 3. Palpitations: Holter monitor (7/11) with isolated PVCs and PACs 4. Valvular heart disease: Echo (7/11): EF 02-54%, grade I diastolic dysfunction, mild-moderate AI, mild-moderate MR, moderate PI, PA systolic pressure 30 mmHg.  Echo (3/15) with EF 60-65%, mild AI.  5. Bifascicular block on ECG 6. Cavitary PNA (5/15): Not TB.   SH: Widow.  No tobacco or ETOH.  Lives in an assisted living.  Still driving.   FH: No premature CAD  ROS: All systems reviewed and negative except as per HPI.   Current Outpatient Prescriptions  Medication Sig Dispense Refill  . ALPRAZolam (XANAX) 0.25 MG tablet Take 1 tablet by mouth 3 (three) times daily.  0  . aspirin 81 MG EC tablet Take 81 mg by mouth daily.      . celecoxib (CELEBREX) 200 MG capsule Take 200 mg by mouth at bedtime.     . Cyanocobalamin (VITAMIN B-12 PO) Take 1 tablet by mouth daily.    Marland Kitchen EAR WAX REMOVAL DROPS 6.5 % otic solution Place 5 drops into both ears 2 (two) times daily as needed (for ears).     . feeding supplement, RESOURCE BREEZE,  (RESOURCE BREEZE) LIQD Take 1 Container by mouth 2 (two) times daily between meals.    . ferrous gluconate (FERGON) 325 MG tablet Take 325 mg by mouth daily with breakfast.    . LUMIGAN 0.01 % SOLN Place 1 drop into the right eye at bedtime.     . tamoxifen (NOLVADEX) 20 MG tablet Take 1 tablet (20 mg total) by mouth daily. 30 tablet 12  . TRIBENZOR 40-5-25 MG TABS Take 1 tablet by mouth daily.  0   No current facility-administered medications for this visit.    BP 128/68 mmHg  Pulse 94  Ht 4\' 9"  (1.448 m)  Wt 114 lb (51.71 kg)  BMI 24.66 kg/m2  SpO2 97% General: NAD Neck: No JVD, no thyromegaly or thyroid nodule.  Lungs: Clear to auscultation bilaterally with normal respiratory effort. CV: Nondisplaced PMI.  Heart regular S1/S2, widely split S2, no Y7/C6, 2/6 diastolic murmur along the sternal border.  No peripheral edema.  No carotid bruit.  Normal pedal pulses. Bilateral ankle varicosities. Abdomen: Soft, nontender, no hepatosplenomegaly, no distention.  Neurologic: Alert and oriented x 3.  Psych: Normal affect. Extremities: No clubbing or cyanosis.   Assessment/Plan: 1. Valvular heart disease: She has a prominent diastolic murmur on exam.  However, last echo in 3/15 showed only mild aortic insufficiency.  She denies significant dyspnea.  2. HTN: BP controlled on Tribenzor.  3. I recommended that she use a cane for stability.   Followup in 1 year.   Loralie Champagne 05/04/2014

## 2014-05-05 NOTE — Addendum Note (Signed)
Addended by: Katrine Coho on: 05/05/2014 08:16 AM   Modules accepted: Orders

## 2014-05-17 DIAGNOSIS — F99 Mental disorder, not otherwise specified: Secondary | ICD-10-CM | POA: Diagnosis not present

## 2014-05-17 DIAGNOSIS — W01119D Fall on same level from slipping, tripping and stumbling with subsequent striking against unspecified sharp object, subsequent encounter: Secondary | ICD-10-CM | POA: Diagnosis not present

## 2014-05-17 DIAGNOSIS — I10 Essential (primary) hypertension: Secondary | ICD-10-CM | POA: Diagnosis not present

## 2014-08-16 DIAGNOSIS — I10 Essential (primary) hypertension: Secondary | ICD-10-CM | POA: Diagnosis not present

## 2014-08-16 DIAGNOSIS — F064 Anxiety disorder due to known physiological condition: Secondary | ICD-10-CM | POA: Diagnosis not present

## 2014-08-23 ENCOUNTER — Ambulatory Visit: Payer: Self-pay | Admitting: Hematology and Oncology

## 2014-08-23 ENCOUNTER — Other Ambulatory Visit: Payer: Self-pay

## 2014-09-02 ENCOUNTER — Ambulatory Visit (HOSPITAL_BASED_OUTPATIENT_CLINIC_OR_DEPARTMENT_OTHER): Payer: Commercial Managed Care - HMO | Admitting: Hematology and Oncology

## 2014-09-02 ENCOUNTER — Telehealth: Payer: Self-pay | Admitting: Hematology and Oncology

## 2014-09-02 ENCOUNTER — Encounter: Payer: Self-pay | Admitting: Hematology and Oncology

## 2014-09-02 ENCOUNTER — Other Ambulatory Visit (HOSPITAL_BASED_OUTPATIENT_CLINIC_OR_DEPARTMENT_OTHER): Payer: Commercial Managed Care - HMO

## 2014-09-02 VITALS — BP 158/58 | HR 65 | Temp 98.1°F | Resp 18 | Ht <= 58 in | Wt 113.4 lb

## 2014-09-02 DIAGNOSIS — C50319 Malignant neoplasm of lower-inner quadrant of unspecified female breast: Secondary | ICD-10-CM | POA: Diagnosis not present

## 2014-09-02 DIAGNOSIS — D509 Iron deficiency anemia, unspecified: Secondary | ICD-10-CM

## 2014-09-02 DIAGNOSIS — D0511 Intraductal carcinoma in situ of right breast: Secondary | ICD-10-CM | POA: Diagnosis not present

## 2014-09-02 DIAGNOSIS — Z79810 Long term (current) use of selective estrogen receptor modulators (SERMs): Secondary | ICD-10-CM | POA: Diagnosis not present

## 2014-09-02 DIAGNOSIS — C50311 Malignant neoplasm of lower-inner quadrant of right female breast: Secondary | ICD-10-CM

## 2014-09-02 DIAGNOSIS — Z170 Estrogen receptor positive status [ER+]: Secondary | ICD-10-CM | POA: Diagnosis not present

## 2014-09-02 LAB — IRON AND TIBC CHCC
%SAT: 22 % (ref 21–57)
Iron: 57 ug/dL (ref 41–142)
TIBC: 259 ug/dL (ref 236–444)
UIBC: 202 ug/dL (ref 120–384)

## 2014-09-02 LAB — CBC WITH DIFFERENTIAL/PLATELET
BASO%: 0.6 % (ref 0.0–2.0)
BASOS ABS: 0 10*3/uL (ref 0.0–0.1)
EOS ABS: 0.1 10*3/uL (ref 0.0–0.5)
EOS%: 2.4 % (ref 0.0–7.0)
HCT: 34.7 % — ABNORMAL LOW (ref 34.8–46.6)
HEMOGLOBIN: 11.1 g/dL — AB (ref 11.6–15.9)
LYMPH%: 44.6 % (ref 14.0–49.7)
MCH: 22.8 pg — ABNORMAL LOW (ref 25.1–34.0)
MCHC: 32 g/dL (ref 31.5–36.0)
MCV: 71.3 fL — AB (ref 79.5–101.0)
MONO#: 0.5 10*3/uL (ref 0.1–0.9)
MONO%: 14.7 % — ABNORMAL HIGH (ref 0.0–14.0)
NEUT%: 37.7 % — ABNORMAL LOW (ref 38.4–76.8)
NEUTROS ABS: 1.3 10*3/uL — AB (ref 1.5–6.5)
PLATELETS: 102 10*3/uL — AB (ref 145–400)
RBC: 4.87 10*6/uL (ref 3.70–5.45)
RDW: 14.5 % (ref 11.2–14.5)
WBC: 3.3 10*3/uL — ABNORMAL LOW (ref 3.9–10.3)
lymph#: 1.5 10*3/uL (ref 0.9–3.3)

## 2014-09-02 LAB — VITAMIN B12: Vitamin B-12: 951 pg/mL — ABNORMAL HIGH (ref 211–911)

## 2014-09-02 LAB — FOLATE: Folate: 15.4 ng/mL

## 2014-09-02 LAB — FERRITIN CHCC: Ferritin: 454 ng/ml — ABNORMAL HIGH (ref 9–269)

## 2014-09-02 IMAGING — CT CT CHEST W/ CM
2 of 4 series · 15 of 36 positions shown, 18 images · IV contrast (Omni 300)
Comparison: 07/09/2013.

CLINICAL DATA: Cavitary lung lesion followup.  Cough.

EXAM:
CT CHEST WITH CONTRAST
TECHNIQUE: Multidetector CT imaging of the chest was performed during
intravenous contrast administration.
CONTRAST:  70mL OMNIPAQUE IOHEXOL 300 MG/ML  SOLN

[Series 2: thorax 5.0 i31f 1 · axial · 0.64mm/px · z∈[+1193,+1443]mm · 12 of 60 slices shown, 15 images]
[im 5/60  mediastinal]
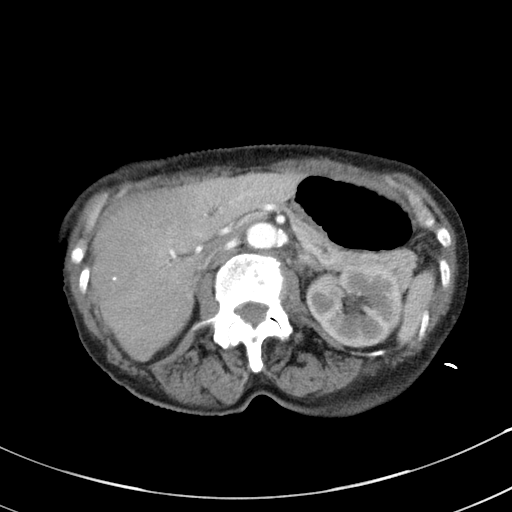
[im 5/60  lung]
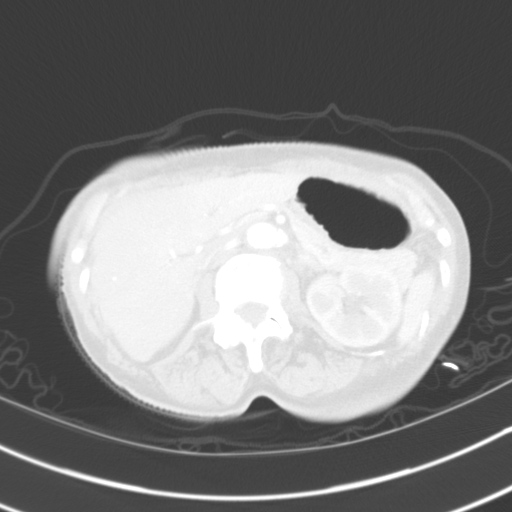
[im 9/60  lung]
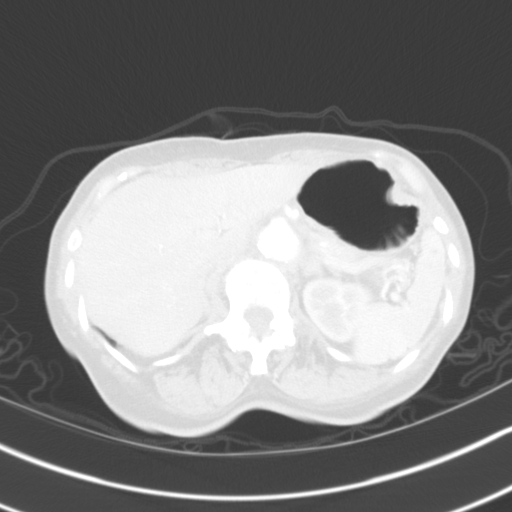
[im 13/60  lung]
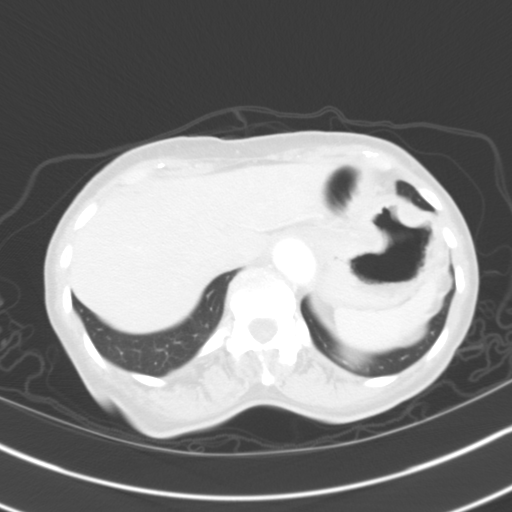
[im 17/60  lung]
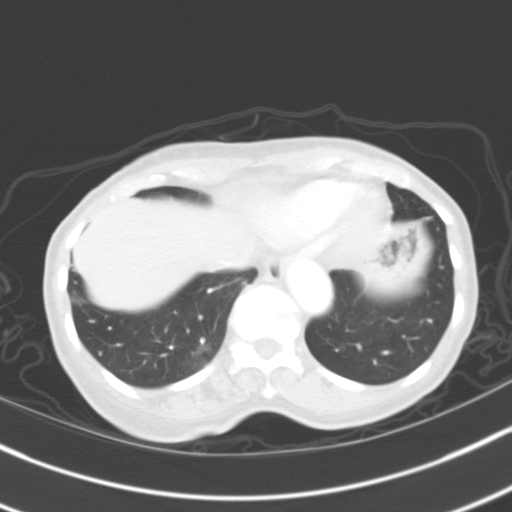
[im 22/60  mediastinal]
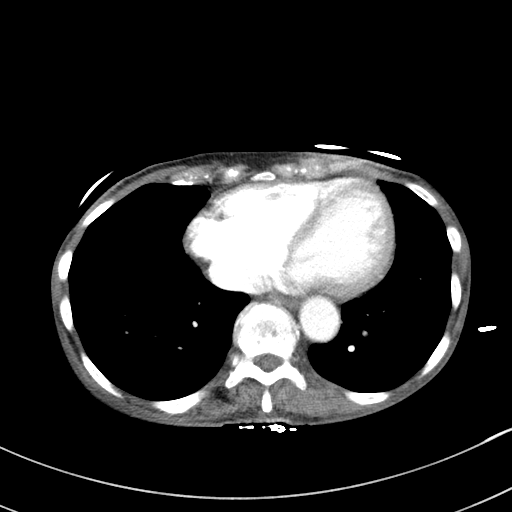
[im 22/60  lung]
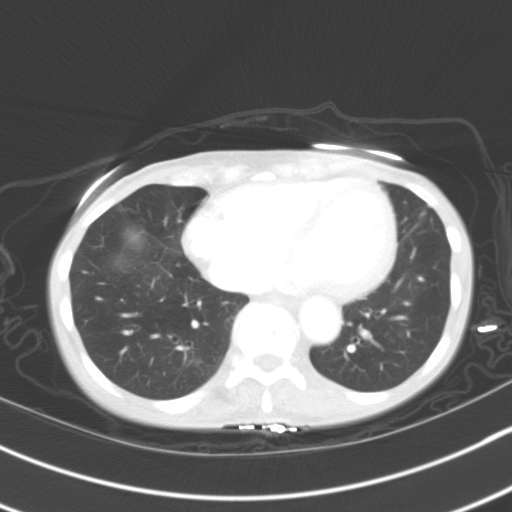
[im 26/60  lung]
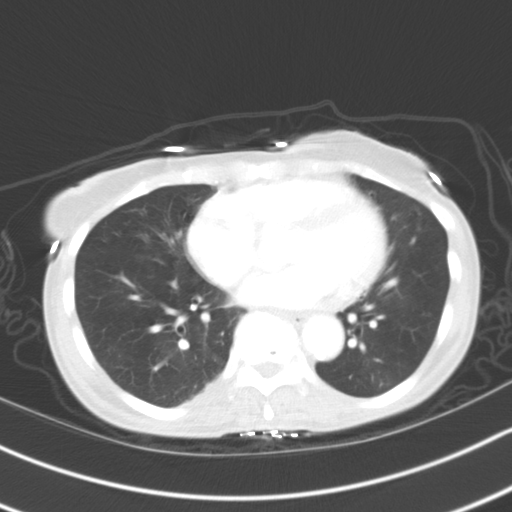
[im 34/60  lung]
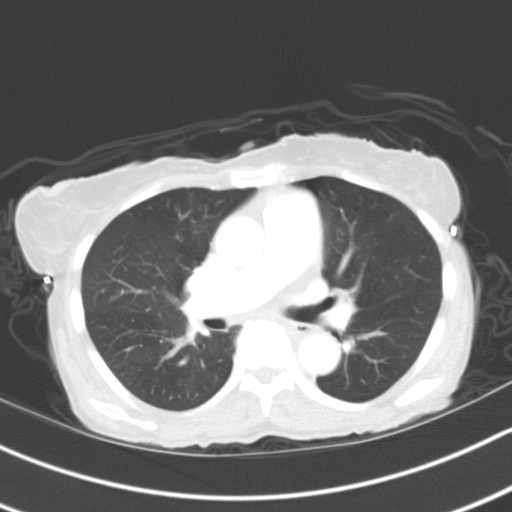
[im 38/60  lung]
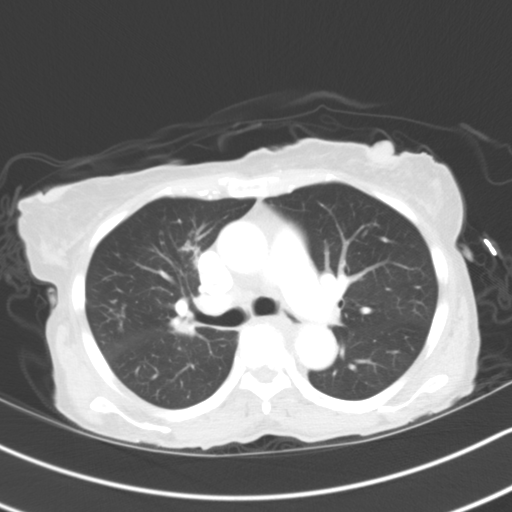
[im 43/60  mediastinal]
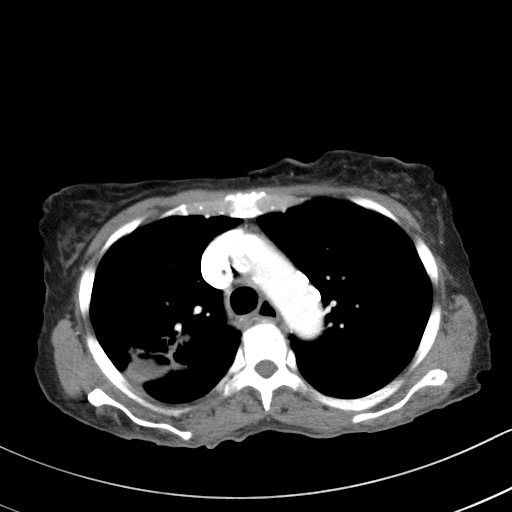
[im 43/60  lung]
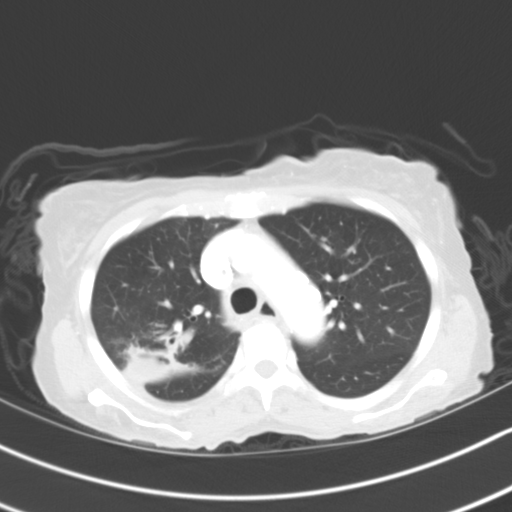
[im 47/60  lung]
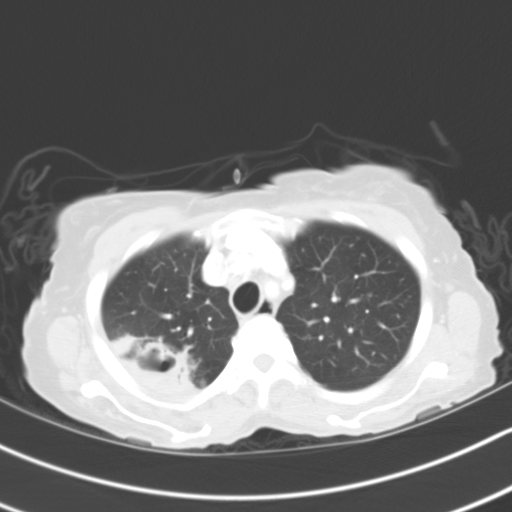
[im 51/60  lung]
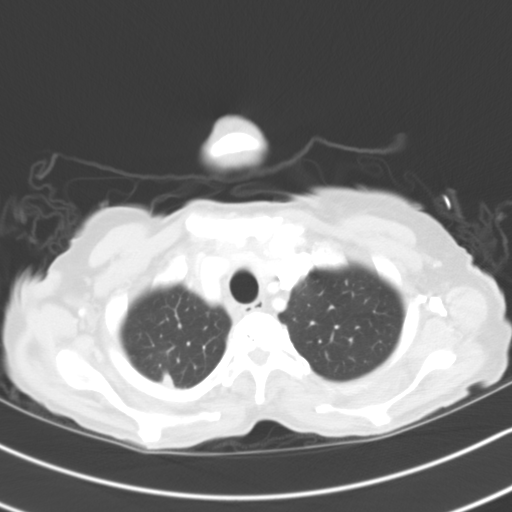
[im 55/60  lung]
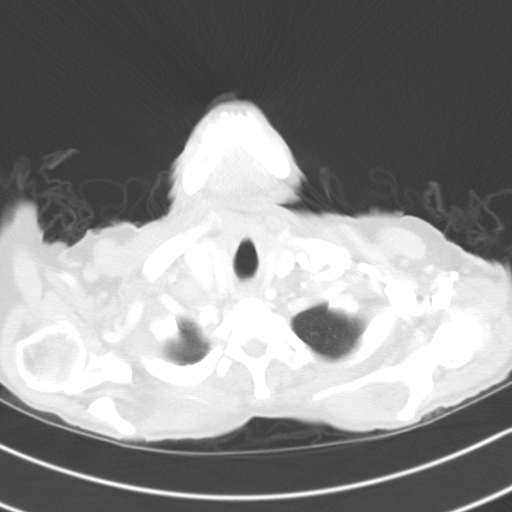

[Series 4: coronal · coronal · 0.59mm/px · 3 of 94 slices shown]
[im 19/94  lung]
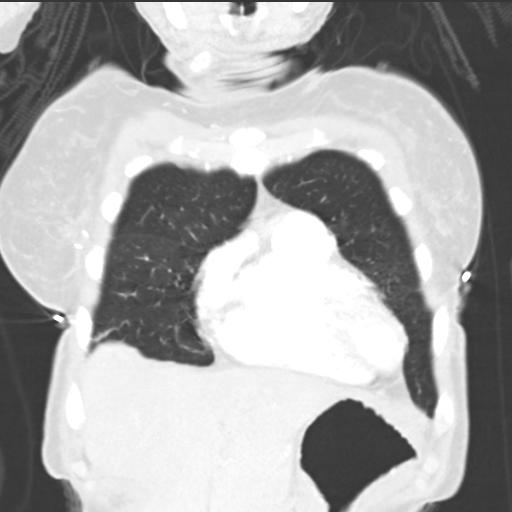
[im 38/94  lung]
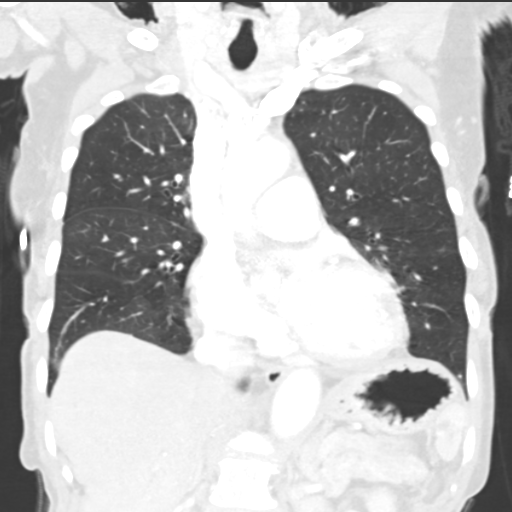
[im 56/94  lung]
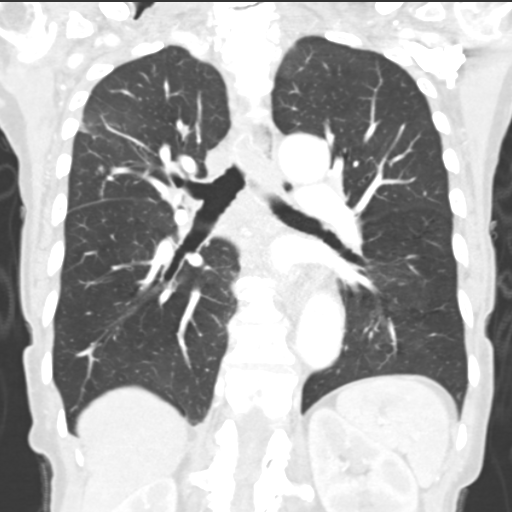

[15 of 36 positions shown; findings below may reference images not displayed]

FINDINGS: Left upper lobe cavitary lesion remains present. There is less
airspace disease surrounding the lesion. On today's examination,
this measures 67 mm x 38 mm. The cavitation is present centrally,
with a deep tendon meniscus, best seen on the sagittal reconstructed
images. This lesion is a isolated. 1 cm right hilar lymph node is
present, likely reactive. There is no pleural effusion. The
previously seen in posteriorly layering effusion has resolved,
compatible with response to the treatment. There is no axillary
adenopathy. Aortic arch atherosclerosis. Heart grossly normal.
Incidental imaging of the upper abdomen shows left renal cysts. No
aggressive osseous lesions. Severe thoracic and lower cervical
spondylosis.
IMPRESSION: 1. Posterior right upper lobe cavitary lesion today measures 67 mm x
38 mm.
2. Resolution of right pleural effusion and decreased airspace
disease around the cavitary lesion suggests response the treatment.
Persistent fluid level within the cavitary portion.
3. Borderline right hilar adenopathy is probably reactive.
4. Continued follow-up to ensure resolution and expected evolution
to scarring is recommended.

## 2014-09-02 NOTE — Assessment & Plan Note (Signed)
Iron studies done December 2015 revealed normal iron studies with elevated ferritin, and that time hemoglobin was improving up to 11.4 in January was up to 14 g. Today's blood work is pending. Since there is no further concerns for microcytic anemia, we do not need to follow on as closely. Patient is very worried about weight loss Return to clinic in 6 months with iron studies CBC and follow

## 2014-09-02 NOTE — Assessment & Plan Note (Signed)
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 Patient is currently on antiestrogen therapy with tamoxifen that started September 2013.  Tamoxifen toxicities: No major side effects or concerns Breast Cancer Surveillance: 1. Breast exam 09/02/2014: Normal 2. Mammogram 10/06/2013 No abnormalities. Postsurgical changes. Breast Density Category B. I recommended that she get 3-D mammograms for surveillance. Discussed the differences between different breast density categories.

## 2014-09-02 NOTE — Progress Notes (Signed)
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   Staging form: Breast, AJCC 7th Edition     Clinical: Stage 0 (Tis (DCIS), N0, cM0) - Unsigned       Staging comments: Staged at breast conference 7.31.13      Pathologic: No stage assigned - Unsigned   SUMMARY OF ONCOLOGIC HISTORY:   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   10/02/2011 Initial Biopsy Low-grade DCIS arising in a background of papilloma ER 100%, PR 100%   11/07/2011 Surgery Right breast lumpectomy DCIS intermediate grade 0.4 cm   11/29/2011 -  Anti-estrogen oral therapy Tamoxifen 20 mg daily    CHIEF COMPLIANT: Follow-up of iron deficiency anemia and right breast cancer  INTERVAL HISTORY: Brianna Hunter is a 81 year old with above-mentioned history of iron deficiency and a right breast cancer is here for follow-up. She has been anemic with a hemoglobin in the 11 g range. She does not feel too tired or shortness of breath. She does feel occasionally lightheaded. Denies any chest pain or dizziness. She says she eats well and fortunately she has loose stools and for which she takes anti-diarrheal medications which make her constipated occasionally.  REVIEW OF SYSTEMS:   Constitutional: Denies fevers, chills or abnormal weight loss Eyes: Denies blurriness of vision Ears, nose, mouth, throat, and face: Denies mucositis or sore throat Respiratory: Denies cough, dyspnea or wheezes Cardiovascular: Denies palpitation, chest discomfort or lower extremity swelling Gastrointestinal:  Diarrhea alternating with constipation Skin: Denies abnormal skin rashes Lymphatics: Denies new lymphadenopathy or easy bruising Neurological:Denies numbness, tingling or new weaknesses Behavioral/Psych: Mood is stable, no new changes  Breast:  denies any pain or lumps or nodules in either breasts All other systems were reviewed with  the patient and are negative.  I have reviewed the past medical history, past surgical history, social history and family history with the patient and they are unchanged from previous note.  ALLERGIES:  is allergic to milk-related compounds and augmentin.  MEDICATIONS:  Current Outpatient Prescriptions  Medication Sig Dispense Refill  . ALPRAZolam (XANAX) 0.25 MG tablet Take 1 tablet by mouth 3 (three) times daily.  0  . aspirin 81 MG EC tablet Take 81 mg by mouth daily.      . celecoxib (CELEBREX) 200 MG capsule Take 200 mg by mouth at bedtime.     . Cyanocobalamin (VITAMIN B-12 PO) Take 1 tablet by mouth daily.    Marland Kitchen EAR WAX REMOVAL DROPS 6.5 % otic solution Place 5 drops into both ears 2 (two) times daily as needed (for ears).     . feeding supplement, RESOURCE BREEZE, (RESOURCE BREEZE) LIQD Take 1 Container by mouth 2 (two) times daily between meals.    . ferrous gluconate (FERGON) 325 MG tablet Take 325 mg by mouth daily with breakfast.    . LUMIGAN 0.01 % SOLN Place 1 drop into the right eye at bedtime.     . tamoxifen (NOLVADEX) 20 MG tablet Take 1 tablet (20 mg total) by mouth daily. 30 tablet 12  . TRIBENZOR 40-5-25 MG TABS Take 1 tablet by mouth daily.  0   No current facility-administered medications for this visit.    PHYSICAL EXAMINATION: ECOG PERFORMANCE STATUS: 1 - Symptomatic but completely ambulatory  Filed Vitals:   09/02/14 1127  BP: 158/58  Pulse: 65  Temp: 98.1 F (36.7 C)  Resp: 18   Filed Weights  09/02/14 1127  Weight: 113 lb 6.4 oz (51.438 kg)    GENERAL:alert, no distress and comfortable SKIN: skin color, texture, turgor are normal, no rashes or significant lesions EYES: normal, Conjunctiva are pink and non-injected, sclera clear OROPHARYNX:no exudate, no erythema and lips, buccal mucosa, and tongue normal  NECK: supple, thyroid normal size, non-tender, without nodularity LYMPH:  no palpable lymphadenopathy in the cervical, axillary or  inguinal LUNGS: clear to auscultation and percussion with normal breathing effort HEART: regular rate & rhythm and no murmurs and no lower extremity edema ABDOMEN:abdomen soft, non-tender and normal bowel sounds Musculoskeletal:no cyanosis of digits and no clubbing  NEURO: alert & oriented x 3 with fluent speech, no focal motor/sensory deficits   LABORATORY DATA:  I have reviewed the data as listed   Chemistry      Component Value Date/Time   NA 141 03/28/2014 1433   NA 141 10/15/2013 1342   K 4.3 03/28/2014 1433   K 4.6 10/15/2013 1342   CL 108 03/28/2014 1433   CL 107 08/19/2012 1026   CO2 27 10/15/2013 1342   CO2 27 07/14/2013 0524   BUN 28* 03/28/2014 1433   BUN 17.0 10/15/2013 1342   CREATININE 0.90 03/28/2014 1433   CREATININE 0.9 10/15/2013 1342      Component Value Date/Time   CALCIUM 9.5 10/15/2013 1342   CALCIUM 8.7 07/14/2013 0524   ALKPHOS 40 10/15/2013 1342   ALKPHOS 84 07/10/2013 0850   AST 13 10/15/2013 1342   AST 27 07/10/2013 0850   ALT 7 10/15/2013 1342   ALT 10 07/10/2013 0850   BILITOT 0.41 10/15/2013 1342   BILITOT 0.4 07/10/2013 0850       Lab Results  Component Value Date   WBC 3.3* 09/02/2014   HGB 11.1* 09/02/2014   HCT 34.7* 09/02/2014   MCV 71.3* 09/02/2014   PLT 102* 09/02/2014   NEUTROABS 1.3* 09/02/2014     ASSESSMENT & PLAN:  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 Patient is currently on antiestrogen therapy with tamoxifen that started September 2013.  Tamoxifen toxicities: No major side effects or concerns Breast Cancer Surveillance: 1. Breast exam 09/02/2014: Normal 2. Mammogram 10/06/2013 No abnormalities. Postsurgical changes. Breast Density Category B. I recommended that she get 3-D mammograms for surveillance. Discussed the differences between different breast density categories.     Microcytic anemia Iron studies done December  2015 revealed normal iron studies with elevated ferritin, and that time hemoglobin was improving up to 11.4 in January was up to 14 g. Today's blood work shows a hemoglobin of 11.1 with an MCV of 71.3 and platelets of 102 and a white count of 3.3 I discussed with her that the pancytopenia could be related to myelodysplastic syndrome or bone marrow suppression or lack of micronutrients. Given her advanced age we elected not to pursue bone marrow biopsy. We will treat her conservatively with a follow-up in 6 months with blood work.  No orders of the defined types were placed in this encounter.   The patient has a good understanding of the overall plan. she agrees with it. she will call with any problems that may develop before the next visit here.   Rulon Eisenmenger, MD

## 2014-09-02 NOTE — Telephone Encounter (Signed)
Gave avs & calendar for December. °

## 2014-09-05 ENCOUNTER — Other Ambulatory Visit: Payer: Self-pay

## 2014-09-16 DIAGNOSIS — H18233 Secondary corneal edema, bilateral: Secondary | ICD-10-CM | POA: Diagnosis not present

## 2014-09-16 DIAGNOSIS — H4011X1 Primary open-angle glaucoma, mild stage: Secondary | ICD-10-CM | POA: Diagnosis not present

## 2014-10-14 DIAGNOSIS — Z853 Personal history of malignant neoplasm of breast: Secondary | ICD-10-CM | POA: Diagnosis not present

## 2014-10-17 DIAGNOSIS — I10 Essential (primary) hypertension: Secondary | ICD-10-CM | POA: Diagnosis not present

## 2014-10-17 DIAGNOSIS — R7309 Other abnormal glucose: Secondary | ICD-10-CM | POA: Diagnosis not present

## 2014-10-17 DIAGNOSIS — G44209 Tension-type headache, unspecified, not intractable: Secondary | ICD-10-CM | POA: Diagnosis not present

## 2014-10-17 DIAGNOSIS — F0390 Unspecified dementia without behavioral disturbance: Secondary | ICD-10-CM | POA: Diagnosis not present

## 2014-12-25 ENCOUNTER — Other Ambulatory Visit: Payer: Self-pay | Admitting: Hematology and Oncology

## 2014-12-26 NOTE — Telephone Encounter (Signed)
Chart reviewed.

## 2015-01-17 DIAGNOSIS — G44209 Tension-type headache, unspecified, not intractable: Secondary | ICD-10-CM | POA: Diagnosis not present

## 2015-01-17 DIAGNOSIS — F064 Anxiety disorder due to known physiological condition: Secondary | ICD-10-CM | POA: Diagnosis not present

## 2015-01-17 DIAGNOSIS — I10 Essential (primary) hypertension: Secondary | ICD-10-CM | POA: Diagnosis not present

## 2015-01-17 DIAGNOSIS — F99 Mental disorder, not otherwise specified: Secondary | ICD-10-CM | POA: Diagnosis not present

## 2015-02-09 DIAGNOSIS — Z0000 Encounter for general adult medical examination without abnormal findings: Secondary | ICD-10-CM | POA: Diagnosis not present

## 2015-02-09 DIAGNOSIS — H4010X0 Unspecified open-angle glaucoma, stage unspecified: Secondary | ICD-10-CM | POA: Diagnosis not present

## 2015-02-09 DIAGNOSIS — C50912 Malignant neoplasm of unspecified site of left female breast: Secondary | ICD-10-CM | POA: Diagnosis not present

## 2015-02-09 DIAGNOSIS — I10 Essential (primary) hypertension: Secondary | ICD-10-CM | POA: Diagnosis not present

## 2015-02-09 DIAGNOSIS — K219 Gastro-esophageal reflux disease without esophagitis: Secondary | ICD-10-CM | POA: Diagnosis not present

## 2015-02-09 DIAGNOSIS — Z6822 Body mass index (BMI) 22.0-22.9, adult: Secondary | ICD-10-CM | POA: Diagnosis not present

## 2015-02-28 ENCOUNTER — Telehealth: Payer: Self-pay | Admitting: Hematology and Oncology

## 2015-02-28 NOTE — Telephone Encounter (Signed)
Returned patients call to reschedule her appointment and left a message with a new appointment

## 2015-03-03 ENCOUNTER — Ambulatory Visit: Payer: Self-pay | Admitting: Hematology and Oncology

## 2015-03-03 ENCOUNTER — Other Ambulatory Visit: Payer: Self-pay

## 2015-03-17 ENCOUNTER — Ambulatory Visit (HOSPITAL_BASED_OUTPATIENT_CLINIC_OR_DEPARTMENT_OTHER): Payer: Commercial Managed Care - HMO | Admitting: Hematology and Oncology

## 2015-03-17 ENCOUNTER — Other Ambulatory Visit (HOSPITAL_BASED_OUTPATIENT_CLINIC_OR_DEPARTMENT_OTHER): Payer: Commercial Managed Care - HMO

## 2015-03-17 ENCOUNTER — Telehealth: Payer: Self-pay | Admitting: Hematology and Oncology

## 2015-03-17 ENCOUNTER — Encounter: Payer: Self-pay | Admitting: Hematology and Oncology

## 2015-03-17 VITALS — BP 161/75 | HR 87 | Temp 97.4°F | Resp 20 | Ht <= 58 in | Wt 116.7 lb

## 2015-03-17 DIAGNOSIS — D61818 Other pancytopenia: Secondary | ICD-10-CM

## 2015-03-17 DIAGNOSIS — D509 Iron deficiency anemia, unspecified: Secondary | ICD-10-CM

## 2015-03-17 DIAGNOSIS — C50311 Malignant neoplasm of lower-inner quadrant of right female breast: Secondary | ICD-10-CM

## 2015-03-17 LAB — CBC WITH DIFFERENTIAL/PLATELET
BASO%: 0.9 % (ref 0.0–2.0)
Basophils Absolute: 0 10*3/uL (ref 0.0–0.1)
EOS ABS: 0.1 10*3/uL (ref 0.0–0.5)
EOS%: 1.5 % (ref 0.0–7.0)
HEMATOCRIT: 36.3 % (ref 34.8–46.6)
HEMOGLOBIN: 11.7 g/dL (ref 11.6–15.9)
LYMPH%: 43.7 % (ref 14.0–49.7)
MCH: 23 pg — ABNORMAL LOW (ref 25.1–34.0)
MCHC: 32.2 g/dL (ref 31.5–36.0)
MCV: 71.3 fL — ABNORMAL LOW (ref 79.5–101.0)
MONO#: 0.6 10*3/uL (ref 0.1–0.9)
MONO%: 18.5 % — ABNORMAL HIGH (ref 0.0–14.0)
NEUT#: 1.2 10*3/uL — ABNORMAL LOW (ref 1.5–6.5)
NEUT%: 35.4 % — ABNORMAL LOW (ref 38.4–76.8)
Platelets: 115 10*3/uL — ABNORMAL LOW (ref 145–400)
RBC: 5.09 10*6/uL (ref 3.70–5.45)
RDW: 14.7 % — AB (ref 11.2–14.5)
WBC: 3.4 10*3/uL — ABNORMAL LOW (ref 3.9–10.3)
lymph#: 1.5 10*3/uL (ref 0.9–3.3)

## 2015-03-17 LAB — IRON AND TIBC
%SAT: 30 % (ref 21–57)
IRON: 81 ug/dL (ref 41–142)
TIBC: 270 ug/dL (ref 236–444)
UIBC: 189 ug/dL (ref 120–384)

## 2015-03-17 LAB — FERRITIN: Ferritin: 516 ng/ml — ABNORMAL HIGH (ref 9–269)

## 2015-03-17 NOTE — Assessment & Plan Note (Signed)
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 Patient is currently on antiestrogen therapy with tamoxifen that started September 2013.  Tamoxifen toxicities: No major side effects or concerns Breast Cancer Surveillance: 1. Breast exam 03/17/2015: Normal 2. Mammogram 10/14/2014 No abnormalities. Postsurgical changes. Breast Density Category B. I recommended that she get 3-D mammograms for surveillance. Discussed the differences between different breast density categories.

## 2015-03-17 NOTE — Addendum Note (Signed)
Addended by: Prentiss Bells on: 03/17/2015 02:48 PM   Modules accepted: Medications

## 2015-03-17 NOTE — Telephone Encounter (Signed)
Unable to reach patient by phone,a new letter and calendar has been mailed to the patient

## 2015-03-17 NOTE — Progress Notes (Signed)
Patient Care Team: Brianna Lei, MD as PCP - General (Family Medicine)  DIAGNOSIS: 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 [facility])   Staging form: Breast, AJCC 7th Edition     Clinical: Stage 0 (Tis (DCIS), N0, cM0) - Unsigned       Staging comments: Staged at breast conference 7.31.13      Pathologic: No stage assigned - Unsigned   SUMMARY OF ONCOLOGIC HISTORY:   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 ([place])   10/02/2011 Initial Biopsy Low-grade DCIS arising in a background of papilloma ER 100%, PR 100%   11/07/2011 Surgery Right breast lumpectomy DCIS intermediate grade 0.4 cm   11/29/2011 -  Anti-estrogen oral therapy Tamoxifen 20 mg daily    CHIEF COMPLIANT: Follow-up of pancytopenia and tamoxifen  INTERVAL HISTORY: Brianna Hunter is a 82 year old with above-mentioned history of right breast cancer who also has pancytopenia and is being watched and monitored. She is currently on iron and B-12 supplementation. She reports that she does not feel well because of abdominal issues. She has to take loperamide because of diarrhea but sometimes if she takes too much of it she gets constipated.  REVIEW OF SYSTEMS:   Constitutional: Denies fevers, chills or abnormal weight loss Eyes: Denies blurriness of vision Ears, nose, mouth, throat, and face: Denies mucositis or sore throat Respiratory: Denies cough, dyspnea or wheezes Cardiovascular: Denies palpitation, chest discomfort Gastrointestinal:   diarrhea alternating with constipation Skin: Denies abnormal skin rashes Lymphatics: Denies new lymphadenopathy or easy bruising Neurological:Denies numbness, tingling or new weaknesses Behavioral/Psych: Mood is stable, no new changes  Extremities: No lower extremity edema All other systems were reviewed with the patient and are negative.  I have reviewed the past medical history, past surgical history, social history and family history with the  patient and they are unchanged from previous note.  ALLERGIES:  is allergic to milk-related compounds and augmentin.  MEDICATIONS:  Current Outpatient Prescriptions  Medication Sig Dispense Refill  . ALPRAZolam (XANAX) 0.25 MG tablet Take 1 tablet by mouth 3 (three) times daily.  0  . aspirin 81 MG EC tablet Take 81 mg by mouth daily.      . celecoxib (CELEBREX) 200 MG capsule Take 200 mg by mouth at bedtime.     . Cyanocobalamin (VITAMIN B-12 PO) Take 1 tablet by mouth daily.    Marland Kitchen EAR WAX REMOVAL DROPS 6.5 % otic solution Place 5 drops into both ears 2 (two) times daily as needed (for ears).     . feeding supplement, RESOURCE BREEZE, (RESOURCE BREEZE) LIQD Take 1 Container by mouth 2 (two) times daily between meals.    . ferrous gluconate (FERGON) 325 MG tablet Take 325 mg by mouth daily with breakfast.    . LUMIGAN 0.01 % SOLN Place 1 drop into the right eye at bedtime.     . tamoxifen (NOLVADEX) 20 MG tablet take 1 tablet by mouth once daily 30 tablet 2  . TRIBENZOR 40-5-25 MG TABS Take 1 tablet by mouth daily.  0   No current facility-administered medications for this visit.    PHYSICAL EXAMINATION: ECOG PERFORMANCE STATUS: 1 - Symptomatic but completely ambulatory  Filed Vitals:   03/17/15 1153  BP: 161/75  Pulse: 87  Temp: 97.4 F (36.3 C)  Resp: 20   Filed Weights   03/17/15 1153  Weight: 116 lb 11.2 oz (52.935 kg)    GENERAL:alert, no distress and comfortable SKIN: skin color, texture, turgor are normal,  no rashes or significant lesions EYES: normal, Conjunctiva are pink and non-injected, sclera clear OROPHARYNX:no exudate, no erythema and lips, buccal mucosa, and tongue normal  NECK: supple, thyroid normal size, non-tender, without nodularity LYMPH:  no palpable lymphadenopathy in the cervical, axillary or inguinal LUNGS: clear to auscultation and percussion with normal breathing effort HEART: regular rate & rhythm and no murmurs and no lower extremity  edema ABDOMEN:abdomen soft, non-tender and normal bowel sounds MUSCULOSKELETAL:no cyanosis of digits and no clubbing  NEURO: alert & oriented x 3 with fluent speech, no focal motor/sensory deficits EXTREMITIES: No lower extremity edema LABORATORY DATA:  I have reviewed the data as listed   Chemistry      Component Value Date/Time   NA 141 03/28/2014 1433   NA 141 10/15/2013 1342   K 4.3 03/28/2014 1433   K 4.6 10/15/2013 1342   CL 108 03/28/2014 1433   CL 107 08/19/2012 1026   CO2 27 10/15/2013 1342   CO2 27 07/14/2013 0524   BUN 28* 03/28/2014 1433   BUN 17.0 10/15/2013 1342   CREATININE 0.90 03/28/2014 1433   CREATININE 0.9 10/15/2013 1342      Component Value Date/Time   CALCIUM 9.5 10/15/2013 1342   CALCIUM 8.7 07/14/2013 0524   ALKPHOS 40 10/15/2013 1342   ALKPHOS 84 07/10/2013 0850   AST 13 10/15/2013 1342   AST 27 07/10/2013 0850   ALT 7 10/15/2013 1342   ALT 10 07/10/2013 0850   BILITOT 0.41 10/15/2013 1342   BILITOT 0.4 07/10/2013 0850       Lab Results  Component Value Date   WBC 3.4* 03/17/2015   HGB 11.7 03/17/2015   HCT 36.3 03/17/2015   MCV 71.3* 03/17/2015   PLT 115* 03/17/2015   NEUTROABS 1.2* 03/17/2015     ASSESSMENT & PLAN:  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 Patient is currently on antiestrogen therapy with tamoxifen that started September 2013.  Tamoxifen toxicities: No major side effects or concerns Breast Cancer Surveillance: 1. Breast exam 03/17/2015: Normal 2. Mammogram 10/14/2014 No abnormalities. Postsurgical changes. Breast Density Category B. I recommended that she get 3-D mammograms for surveillance. Discussed the differences between different breast density categories.  Microcytic anemia Iron studies done December 2015 revealed normal iron studies with elevated ferritin, and that time hemoglobin was improving up to 11.4 in January was  up to 14 g.  Today's blood work shows a hemoglobin of 11.1 with an MCV of 71.3 and platelets of 102 and a white count of 3.3  I discussed with her that the pancytopenia could be related to myelodysplastic syndrome or bone marrow suppression or lack of micronutrients. Given her advanced age we elected not to pursue bone marrow biopsy. We will treat her conservatively with a follow-up in 6 months with blood work.   No orders of the defined types were placed in this encounter.   The patient has a good understanding of the overall plan. she agrees with it. she will call with any problems that may develop before the next visit here.   Rulon Eisenmenger, MD 03/17/2015

## 2015-03-17 NOTE — Assessment & Plan Note (Signed)
Iron studies done December 2015 revealed normal iron studies with elevated ferritin, and that time hemoglobin was improving up to 11.4 in January was up to 14 g.  Today's blood work shows a hemoglobin of 11.1 with an MCV of 71.3 and platelets of 102 and a white count of 3.3  I discussed with her that the pancytopenia could be related to myelodysplastic syndrome or bone marrow suppression or lack of micronutrients. Given her advanced age we elected not to pursue bone marrow biopsy. We will treat her conservatively with a follow-up in 6 months with blood work.

## 2015-03-25 ENCOUNTER — Other Ambulatory Visit: Payer: Self-pay | Admitting: Hematology and Oncology

## 2015-03-27 ENCOUNTER — Other Ambulatory Visit: Payer: Self-pay | Admitting: *Deleted

## 2015-03-27 DIAGNOSIS — C50311 Malignant neoplasm of lower-inner quadrant of right female breast: Secondary | ICD-10-CM

## 2015-03-27 MED ORDER — TAMOXIFEN CITRATE 20 MG PO TABS: 20.0000 mg | ORAL_TABLET | Freq: Every day | ORAL | Status: DC

## 2015-04-18 DIAGNOSIS — F99 Mental disorder, not otherwise specified: Secondary | ICD-10-CM | POA: Diagnosis not present

## 2015-04-18 DIAGNOSIS — I10 Essential (primary) hypertension: Secondary | ICD-10-CM | POA: Diagnosis not present

## 2015-04-18 DIAGNOSIS — F064 Anxiety disorder due to known physiological condition: Secondary | ICD-10-CM | POA: Diagnosis not present

## 2015-04-18 DIAGNOSIS — E1122 Type 2 diabetes mellitus with diabetic chronic kidney disease: Secondary | ICD-10-CM | POA: Diagnosis not present

## 2015-04-18 DIAGNOSIS — G44209 Tension-type headache, unspecified, not intractable: Secondary | ICD-10-CM | POA: Diagnosis not present

## 2015-05-05 DIAGNOSIS — H18233 Secondary corneal edema, bilateral: Secondary | ICD-10-CM | POA: Diagnosis not present

## 2015-05-05 DIAGNOSIS — H401132 Primary open-angle glaucoma, bilateral, moderate stage: Secondary | ICD-10-CM | POA: Diagnosis not present

## 2015-07-19 DIAGNOSIS — K591 Functional diarrhea: Secondary | ICD-10-CM | POA: Diagnosis not present

## 2015-07-19 DIAGNOSIS — F064 Anxiety disorder due to known physiological condition: Secondary | ICD-10-CM | POA: Diagnosis not present

## 2015-07-19 DIAGNOSIS — I10 Essential (primary) hypertension: Secondary | ICD-10-CM | POA: Diagnosis not present

## 2015-07-19 DIAGNOSIS — I749 Embolism and thrombosis of unspecified artery: Secondary | ICD-10-CM | POA: Diagnosis not present

## 2015-08-24 DIAGNOSIS — R197 Diarrhea, unspecified: Secondary | ICD-10-CM | POA: Diagnosis not present

## 2015-08-24 DIAGNOSIS — I10 Essential (primary) hypertension: Secondary | ICD-10-CM | POA: Diagnosis not present

## 2015-09-28 ENCOUNTER — Encounter: Payer: Self-pay | Admitting: Hematology and Oncology

## 2015-09-28 ENCOUNTER — Other Ambulatory Visit (HOSPITAL_BASED_OUTPATIENT_CLINIC_OR_DEPARTMENT_OTHER): Payer: Commercial Managed Care - HMO

## 2015-09-28 ENCOUNTER — Ambulatory Visit (HOSPITAL_BASED_OUTPATIENT_CLINIC_OR_DEPARTMENT_OTHER): Payer: Commercial Managed Care - HMO | Admitting: Hematology and Oncology

## 2015-09-28 VITALS — BP 127/79 | HR 77 | Temp 98.2°F | Resp 18 | Wt 116.0 lb

## 2015-09-28 DIAGNOSIS — D0511 Intraductal carcinoma in situ of right breast: Secondary | ICD-10-CM

## 2015-09-28 DIAGNOSIS — D509 Iron deficiency anemia, unspecified: Secondary | ICD-10-CM

## 2015-09-28 DIAGNOSIS — C50311 Malignant neoplasm of lower-inner quadrant of right female breast: Secondary | ICD-10-CM

## 2015-09-28 LAB — CBC WITH DIFFERENTIAL/PLATELET
BASO%: 0.5 % (ref 0.0–2.0)
Basophils Absolute: 0 10*3/uL (ref 0.0–0.1)
EOS%: 1.6 % (ref 0.0–7.0)
Eosinophils Absolute: 0.1 10*3/uL (ref 0.0–0.5)
HCT: 36.7 % (ref 34.8–46.6)
HGB: 11.9 g/dL (ref 11.6–15.9)
LYMPH#: 1.7 10*3/uL (ref 0.9–3.3)
LYMPH%: 45.6 % (ref 14.0–49.7)
MCH: 22.9 pg — ABNORMAL LOW (ref 25.1–34.0)
MCHC: 32.4 g/dL (ref 31.5–36.0)
MCV: 70.6 fL — AB (ref 79.5–101.0)
MONO#: 0.4 10*3/uL (ref 0.1–0.9)
MONO%: 10.6 % (ref 0.0–14.0)
NEUT%: 41.7 % (ref 38.4–76.8)
NEUTROS ABS: 1.6 10*3/uL (ref 1.5–6.5)
Platelets: 105 10*3/uL — ABNORMAL LOW (ref 145–400)
RBC: 5.2 10*6/uL (ref 3.70–5.45)
RDW: 14.7 % — AB (ref 11.2–14.5)
WBC: 3.8 10*3/uL — AB (ref 3.9–10.3)

## 2015-09-28 LAB — COMPREHENSIVE METABOLIC PANEL
ALBUMIN: 3.5 g/dL (ref 3.5–5.0)
ALK PHOS: 56 U/L (ref 40–150)
ALT: 10 U/L (ref 0–55)
ANION GAP: 8 meq/L (ref 3–11)
AST: 16 U/L (ref 5–34)
BUN: 19.6 mg/dL (ref 7.0–26.0)
CALCIUM: 9.3 mg/dL (ref 8.4–10.4)
CO2: 27 mEq/L (ref 22–29)
CREATININE: 1 mg/dL (ref 0.6–1.1)
Chloride: 108 mEq/L (ref 98–109)
EGFR: 62 mL/min/{1.73_m2} — ABNORMAL LOW (ref >90)
Glucose: 86 mg/dl (ref 70–140)
Potassium: 4.7 mEq/L (ref 3.5–5.1)
SODIUM: 142 meq/L (ref 136–145)
TOTAL PROTEIN: 7.2 g/dL (ref 6.4–8.3)
Total Bilirubin: 0.4 mg/dL (ref 0.20–1.20)

## 2015-09-28 NOTE — Progress Notes (Signed)
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 She has been tolerating tamoxifen extremely well. She does have occasional hot flashes. Denies any myalgias. She denies any lumps or nodules in the breasts.  REVIEW OF SYSTEMS:   Constitutional: Denies fevers, chills or abnormal weight loss Eyes: Denies blurriness of vision Ears, nose, mouth, throat, and face: Denies mucositis or sore throat Respiratory: Denies cough, dyspnea or wheezes Cardiovascular: Denies palpitation, chest discomfort Gastrointestinal:  Denies nausea, heartburn or change in bowel habits Skin: Denies abnormal skin rashes Lymphatics: Denies new lymphadenopathy or easy bruising Neurological:Denies numbness, tingling or new weaknesses Behavioral/Psych: Mood is stable, no new changes  Extremities: No lower extremity edema Breast:  denies any pain or lumps or nodules in either breasts All other systems were reviewed with the patient and are negative.  I have reviewed the past medical history, past surgical history, social history and family history with the patient and  they are unchanged from previous note.  ALLERGIES:  is allergic to milk-related compounds and augmentin.  MEDICATIONS:  Current Outpatient Prescriptions  Medication Sig Dispense Refill  . ALPRAZolam (XANAX) 0.25 MG tablet Take 1 tablet by mouth 3 (three) times daily.  0  . aspirin 81 MG EC tablet Take 81 mg by mouth daily.      . celecoxib (CELEBREX) 200 MG capsule Take 200 mg by mouth at bedtime.     . Cyanocobalamin (VITAMIN B-12 PO) Take 1 tablet by mouth daily.    Marland Kitchen EAR WAX REMOVAL DROPS 6.5 % otic solution Place 5 drops into both ears 2 (two) times daily as needed (for ears).     . feeding supplement, RESOURCE BREEZE, (RESOURCE BREEZE) LIQD Take 1 Container by mouth 2 (two) times daily between meals.    . ferrous gluconate (FERGON) 325 MG tablet Take 325 mg by mouth daily with breakfast.    . LUMIGAN 0.01 % SOLN Place 1 drop into the right eye at bedtime.     . tamoxifen (NOLVADEX) 20 MG tablet Take 1 tablet (20 mg total) by mouth daily. 90 tablet 3  . TRIBENZOR 40-5-25 MG TABS Take 1 tablet by mouth daily.  0   No current facility-administered medications for this visit.    PHYSICAL EXAMINATION: ECOG PERFORMANCE STATUS: 0 - Asymptomatic  Filed Vitals:   09/28/15 1046  BP: 127/79  Pulse: 77  Temp: 98.2 F (36.8 C)  Resp: 18   Filed Weights   09/28/15 1046  Weight: 116 lb (52.617 kg)    GENERAL:alert, no distress and comfortable SKIN: skin color, texture, turgor are normal, no rashes or significant lesions EYES:  normal, Conjunctiva are pink and non-injected, sclera clear OROPHARYNX:no exudate, no erythema and lips, buccal mucosa, and tongue normal  NECK: supple, thyroid normal size, non-tender, without nodularity LYMPH:  no palpable lymphadenopathy in the cervical, axillary or inguinal LUNGS: clear to auscultation and percussion with normal breathing effort HEART: regular rate & rhythm and no murmurs and no lower extremity edema ABDOMEN:abdomen soft, non-tender and  normal bowel sounds MUSCULOSKELETAL:no cyanosis of digits and no clubbing  NEURO: alert & oriented x 3 with fluent speech, no focal motor/sensory deficits EXTREMITIES: No lower extremity edema BREAST: No palpable masses or nodules in either right or left breasts. No palpable axillary supraclavicular or infraclavicular adenopathy no breast tenderness or nipple discharge. (exam performed in the presence of a chaperone)  LABORATORY DATA:  I have reviewed the data as listed   Chemistry      Component Value Date/Time   NA 141 03/28/2014 1433   NA 141 10/15/2013 1342   K 4.3 03/28/2014 1433   K 4.6 10/15/2013 1342   CL 108 03/28/2014 1433   CL 107 08/19/2012 1026   CO2 27 10/15/2013 1342   CO2 27 07/14/2013 0524   BUN 28* 03/28/2014 1433   BUN 17.0 10/15/2013 1342   CREATININE 0.90 03/28/2014 1433   CREATININE 0.9 10/15/2013 1342      Component Value Date/Time   CALCIUM 9.5 10/15/2013 1342   CALCIUM 8.7 07/14/2013 0524   ALKPHOS 40 10/15/2013 1342   ALKPHOS 84 07/10/2013 0850   AST 13 10/15/2013 1342   AST 27 07/10/2013 0850   ALT 7 10/15/2013 1342   ALT 10 07/10/2013 0850   BILITOT 0.41 10/15/2013 1342   BILITOT 0.4 07/10/2013 0850       Lab Results  Component Value Date   WBC 3.8* 09/28/2015   HGB 11.9 09/28/2015   HCT 36.7 09/28/2015   MCV 70.6* 09/28/2015   PLT 105* 09/28/2015   NEUTROABS 1.6 09/28/2015     ASSESSMENT & PLAN:  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 Patient is currently on antiestrogen therapy with tamoxifen that started September 2013.  Tamoxifen toxicities: No major side effects or concerns  Breast Cancer Surveillance: 1. Breast exam 09/28/2015: Normal 2. Mammogram 10/14/2014 No abnormalities. Postsurgical changes. Breast Density Category B. I recommended that she get 3-D mammograms for surveillance. Discussed the differences between different breast  density categories.  Microcytic anemia Iron studies done December 2015 revealed normal iron studies with elevated ferritin, and that time hemoglobin was improving up to 11.4 in January was up to 14 g.  Today's blood work shows a hemoglobin of 11.9 with an MCV of 71.3 and platelets of 105 and a white count of 3.8 Given her advanced age we elected not to pursue bone marrow biopsy. We will treat her conservatively with a follow-up in 6 months with blood work.   No orders of the defined types were placed in this encounter.   The patient has a good understanding of the overall plan. she agrees with it. she will call with any problems that may develop before the next visit here.   Rulon Eisenmenger, MD 09/28/2015

## 2015-09-28 NOTE — Assessment & Plan Note (Signed)
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 Patient is currently on antiestrogen therapy with tamoxifen that started September 2013.  Tamoxifen toxicities: No major side effects or concerns Breast Cancer Surveillance: 1. Breast exam 09/28/2015: Normal 2. Mammogram 10/14/2014 No abnormalities. Postsurgical changes. Breast Density Category B. I recommended that she get 3-D mammograms for surveillance. Discussed the differences between different breast density categories.  Microcytic anemia Iron studies done December 2015 revealed normal iron studies with elevated ferritin, and that time hemoglobin was improving up to 11.4 in January was up to 14 g.  Today's blood work shows a hemoglobin of 11.1 with an MCV of 71.3 and platelets of 102 and a white count of 3.3  I discussed with her that the pancytopenia could be related to myelodysplastic syndrome or bone marrow suppression or lack of micronutrients. Given her advanced age we elected not to pursue bone marrow biopsy. We will treat her conservatively with a follow-up in 6 months with blood work.

## 2015-11-21 DIAGNOSIS — R928 Other abnormal and inconclusive findings on diagnostic imaging of breast: Secondary | ICD-10-CM | POA: Diagnosis not present

## 2015-11-22 DIAGNOSIS — G44209 Tension-type headache, unspecified, not intractable: Secondary | ICD-10-CM | POA: Diagnosis not present

## 2015-11-22 DIAGNOSIS — G44201 Tension-type headache, unspecified, intractable: Secondary | ICD-10-CM | POA: Diagnosis not present

## 2015-11-22 DIAGNOSIS — R634 Abnormal weight loss: Secondary | ICD-10-CM | POA: Diagnosis not present

## 2015-11-22 DIAGNOSIS — F064 Anxiety disorder due to known physiological condition: Secondary | ICD-10-CM | POA: Diagnosis not present

## 2015-11-22 DIAGNOSIS — E1122 Type 2 diabetes mellitus with diabetic chronic kidney disease: Secondary | ICD-10-CM | POA: Diagnosis not present

## 2015-11-22 DIAGNOSIS — I10 Essential (primary) hypertension: Secondary | ICD-10-CM | POA: Diagnosis not present

## 2015-11-22 DIAGNOSIS — F99 Mental disorder, not otherwise specified: Secondary | ICD-10-CM | POA: Diagnosis not present

## 2015-11-22 DIAGNOSIS — K660 Peritoneal adhesions (postprocedural) (postinfection): Secondary | ICD-10-CM | POA: Diagnosis not present

## 2015-11-27 DIAGNOSIS — H18233 Secondary corneal edema, bilateral: Secondary | ICD-10-CM | POA: Diagnosis not present

## 2015-11-27 DIAGNOSIS — H401131 Primary open-angle glaucoma, bilateral, mild stage: Secondary | ICD-10-CM | POA: Diagnosis not present

## 2015-12-28 DIAGNOSIS — F99 Mental disorder, not otherwise specified: Secondary | ICD-10-CM | POA: Diagnosis not present

## 2015-12-28 DIAGNOSIS — I10 Essential (primary) hypertension: Secondary | ICD-10-CM | POA: Diagnosis not present

## 2015-12-28 DIAGNOSIS — G44201 Tension-type headache, unspecified, intractable: Secondary | ICD-10-CM | POA: Diagnosis not present

## 2015-12-28 DIAGNOSIS — K580 Irritable bowel syndrome with diarrhea: Secondary | ICD-10-CM | POA: Diagnosis not present

## 2016-03-22 ENCOUNTER — Other Ambulatory Visit: Payer: Self-pay | Admitting: Hematology and Oncology

## 2016-03-22 DIAGNOSIS — C50311 Malignant neoplasm of lower-inner quadrant of right female breast: Secondary | ICD-10-CM

## 2016-05-02 DIAGNOSIS — I749 Embolism and thrombosis of unspecified artery: Secondary | ICD-10-CM | POA: Diagnosis not present

## 2016-05-02 DIAGNOSIS — Z0000 Encounter for general adult medical examination without abnormal findings: Secondary | ICD-10-CM | POA: Diagnosis not present

## 2016-05-02 DIAGNOSIS — J22 Unspecified acute lower respiratory infection: Secondary | ICD-10-CM | POA: Diagnosis not present

## 2016-05-02 DIAGNOSIS — I10 Essential (primary) hypertension: Secondary | ICD-10-CM | POA: Diagnosis not present

## 2016-05-02 DIAGNOSIS — I744 Embolism and thrombosis of arteries of extremities, unspecified: Secondary | ICD-10-CM | POA: Diagnosis not present

## 2016-05-09 DIAGNOSIS — L03116 Cellulitis of left lower limb: Secondary | ICD-10-CM | POA: Diagnosis not present

## 2016-05-17 DIAGNOSIS — I10 Essential (primary) hypertension: Secondary | ICD-10-CM | POA: Diagnosis not present

## 2016-05-17 DIAGNOSIS — L03116 Cellulitis of left lower limb: Secondary | ICD-10-CM | POA: Diagnosis not present

## 2016-05-17 DIAGNOSIS — K591 Functional diarrhea: Secondary | ICD-10-CM | POA: Diagnosis not present

## 2016-05-31 DIAGNOSIS — L03116 Cellulitis of left lower limb: Secondary | ICD-10-CM | POA: Diagnosis not present

## 2016-06-13 DIAGNOSIS — H401131 Primary open-angle glaucoma, bilateral, mild stage: Secondary | ICD-10-CM | POA: Diagnosis not present

## 2016-06-13 DIAGNOSIS — H16223 Keratoconjunctivitis sicca, not specified as Sjogren's, bilateral: Secondary | ICD-10-CM | POA: Diagnosis not present

## 2016-06-13 DIAGNOSIS — H18233 Secondary corneal edema, bilateral: Secondary | ICD-10-CM | POA: Diagnosis not present

## 2016-06-28 ENCOUNTER — Telehealth: Payer: Self-pay | Admitting: Emergency Medicine

## 2016-06-28 NOTE — Telephone Encounter (Signed)
Patient called with questions about when she should discontinue her Tamoxifen; Patient states she just filled a 90 day supply. Instructed patient to continue taking the Tamoxifen daily until she is seen by Dr Lindi Adie in the office on 7/20 as scheduled. Patient verbalized understanding.

## 2016-07-12 DIAGNOSIS — J9801 Acute bronchospasm: Secondary | ICD-10-CM | POA: Diagnosis not present

## 2016-07-12 DIAGNOSIS — I10 Essential (primary) hypertension: Secondary | ICD-10-CM | POA: Diagnosis not present

## 2016-07-12 DIAGNOSIS — F064 Anxiety disorder due to known physiological condition: Secondary | ICD-10-CM | POA: Diagnosis not present

## 2016-08-15 DIAGNOSIS — G44201 Tension-type headache, unspecified, intractable: Secondary | ICD-10-CM | POA: Diagnosis not present

## 2016-08-15 DIAGNOSIS — I10 Essential (primary) hypertension: Secondary | ICD-10-CM | POA: Diagnosis not present

## 2016-08-15 DIAGNOSIS — K580 Irritable bowel syndrome with diarrhea: Secondary | ICD-10-CM | POA: Diagnosis not present

## 2016-09-19 DIAGNOSIS — K580 Irritable bowel syndrome with diarrhea: Secondary | ICD-10-CM | POA: Diagnosis not present

## 2016-09-19 DIAGNOSIS — I10 Essential (primary) hypertension: Secondary | ICD-10-CM | POA: Diagnosis not present

## 2016-09-25 ENCOUNTER — Telehealth: Payer: Self-pay | Admitting: *Deleted

## 2016-09-25 NOTE — Telephone Encounter (Signed)
Patient calling to say she thinks she missed her appt with dr Lindi Adie. No appt calendar was mailed to her. Next appt is 09/27/16. Patient made a note of this.

## 2016-09-27 ENCOUNTER — Ambulatory Visit (HOSPITAL_BASED_OUTPATIENT_CLINIC_OR_DEPARTMENT_OTHER): Payer: Medicare HMO | Admitting: Hematology and Oncology

## 2016-09-27 ENCOUNTER — Encounter: Payer: Self-pay | Admitting: Hematology and Oncology

## 2016-09-27 ENCOUNTER — Other Ambulatory Visit (HOSPITAL_BASED_OUTPATIENT_CLINIC_OR_DEPARTMENT_OTHER): Payer: Medicare HMO

## 2016-09-27 DIAGNOSIS — Z853 Personal history of malignant neoplasm of breast: Secondary | ICD-10-CM | POA: Diagnosis not present

## 2016-09-27 DIAGNOSIS — D509 Iron deficiency anemia, unspecified: Secondary | ICD-10-CM

## 2016-09-27 DIAGNOSIS — C50311 Malignant neoplasm of lower-inner quadrant of right female breast: Secondary | ICD-10-CM

## 2016-09-27 LAB — CBC & DIFF AND RETIC
BASO%: 0.5 % (ref 0.0–2.0)
BASOS ABS: 0 10*3/uL (ref 0.0–0.1)
EOS ABS: 0.1 10*3/uL (ref 0.0–0.5)
EOS%: 2 % (ref 0.0–7.0)
HEMATOCRIT: 34.8 % (ref 34.8–46.6)
HGB: 11.1 g/dL — ABNORMAL LOW (ref 11.6–15.9)
Immature Retic Fract: 4.1 % (ref 1.60–10.00)
LYMPH%: 38.4 % (ref 14.0–49.7)
MCH: 22.9 pg — ABNORMAL LOW (ref 25.1–34.0)
MCHC: 31.9 g/dL (ref 31.5–36.0)
MCV: 71.9 fL — ABNORMAL LOW (ref 79.5–101.0)
MONO#: 0.7 10*3/uL (ref 0.1–0.9)
MONO%: 16.6 % — AB (ref 0.0–14.0)
NEUT%: 42.5 % (ref 38.4–76.8)
NEUTROS ABS: 1.7 10*3/uL (ref 1.5–6.5)
Platelets: 109 10*3/uL — ABNORMAL LOW (ref 145–400)
RBC: 4.84 10*6/uL (ref 3.70–5.45)
RDW: 14.9 % — AB (ref 11.2–14.5)
Retic Ct Abs: 17.42 10*3/uL — ABNORMAL LOW (ref 33.70–90.70)
WBC: 4 10*3/uL (ref 3.9–10.3)
lymph#: 1.6 10*3/uL (ref 0.9–3.3)

## 2016-09-27 NOTE — Assessment & Plan Note (Signed)
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 Patient is currently on antiestrogen therapy with tamoxifen that started September 2013.  Tamoxifen toxicities: No major side effects or concerns  Breast Cancer Surveillance: 1. Breast exam 09/27/2016: Normal 2. Mammogram 10/14/2014 No abnormalities. Postsurgical changes.  Microcytic anemia Iron studies done December 2015 revealed normal iron studies with elevated ferritin, and that time hemoglobin was improving up to 11.4 in January was up to 14 g.   Today's blood work shows  Given her advanced age we elected not to pursue bone marrow biopsy. We will treat her conservatively   Return to clinic in 1 year for follow-up labs

## 2016-09-27 NOTE — Progress Notes (Signed)
Patient Care Team: [name], MD as PCP - General (Family Medicine)  DIAGNOSIS:  Encounter Diagnosis  Name Primary?  . 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 She tells me that she has aches and pains all over. Otherwise she is doing quite well. Her mammograms have been normal. She has loose motions and takes medications to help stop the diarrhea. Because of this she is not able to gain weight. She tells me she was placed on a new medication for diarrhea and she is waiting to see if it helps.  REVIEW OF SYSTEMS:   Constitutional: Denies fevers, chills or abnormal weight loss Eyes: Denies blurriness of vision Ears, nose, mouth, throat, and face: Denies mucositis or sore throat Respiratory: Denies cough, dyspnea or wheezes Cardiovascular: Denies palpitation, chest discomfort Gastrointestinal: Diarrhea Skin: Denies abnormal skin rashes Lymphatics: Denies new lymphadenopathy or easy bruising Neurological:Denies numbness, tingling or new weaknesses Behavioral/Psych: Mood is stable, no new changes  Extremities: No lower extremity edema Breast:  denies any pain or lumps or nodules in either breasts All other systems were reviewed with the patient and are negative.  I have reviewed the past medical history, past surgical history, social history  and family history with the patient and they are unchanged from previous note.  ALLERGIES:  is allergic to milk-related compounds and augmentin [amoxicillin-pot clavulanate].  MEDICATIONS:  Current Outpatient Prescriptions  Medication Sig Dispense Refill  . ALPRAZolam (XANAX) 0.25 MG tablet Take 1 tablet by mouth 3 (three) times daily.  0  . aspirin 81 MG EC tablet Take 81 mg by mouth daily.      . celecoxib (CELEBREX) 200 MG capsule Take 200 mg by mouth at bedtime.     . Cyanocobalamin (VITAMIN B-12 PO) Take 1 tablet by mouth daily.    Marland Kitchen EAR WAX REMOVAL DROPS 6.5 % otic solution Place 5 drops into both ears 2 (two) times daily as needed (for ears).     . feeding supplement, RESOURCE BREEZE, (RESOURCE BREEZE) LIQD Take 1 Container by mouth 2 (two) times daily between meals.    . ferrous gluconate (FERGON) 325 MG tablet Take 325 mg by mouth daily with breakfast.    . LUMIGAN 0.01 % SOLN Place 1 drop into the right eye at bedtime.     . tamoxifen (NOLVADEX) 20 MG tablet take 1 tablet by mouth once daily 90 tablet 3  . TRIBENZOR 40-5-25 MG TABS Take 1 tablet by mouth daily.  0   No current facility-administered medications for this visit.     PHYSICAL EXAMINATION: ECOG PERFORMANCE STATUS: 1 - Symptomatic but completely ambulatory  Vitals:   09/27/16 1101  BP: 134/65  Pulse: 75  Resp: 18  Temp: 97.6 F (36.4 C)   Filed Weights   09/27/16 1101  Weight: 117 lb 4.8 oz (53.2 kg)    GENERAL:alert, no distress and comfortable SKIN: skin color, texture, turgor are normal, no rashes or significant lesions EYES: normal, Conjunctiva are pink and non-injected, sclera clear OROPHARYNX:no exudate, no erythema and lips, buccal mucosa, and tongue normal  NECK: supple, thyroid normal size, non-tender, without nodularity LYMPH:  no palpable lymphadenopathy in the cervical, axillary or inguinal LUNGS: clear to auscultation and percussion with normal breathing effort HEART: regular rate & rhythm  and no murmurs and no lower extremity edema ABDOMEN:abdomen soft, non-tender and normal bowel sounds MUSCULOSKELETAL:no cyanosis of digits and no clubbing  NEURO: alert & oriented x 3 with fluent speech, no focal motor/sensory deficits EXTREMITIES: No lower extremity edema BREAST: No palpable masses or nodules in either right or left breasts. No palpable axillary supraclavicular or infraclavicular adenopathy no breast tenderness or nipple discharge. (exam performed in the presence of a chaperone)  LABORATORY DATA:  I have reviewed the data as listed   Chemistry      Component Value Date/Time   NA 142 09/28/2015 1030   K 4.7 09/28/2015 1030   CL 108 03/28/2014 1433   CL 107 08/19/2012 1026   CO2 27 09/28/2015 1030   BUN 19.6 09/28/2015 1030   CREATININE 1.0 09/28/2015 1030      Component Value Date/Time   CALCIUM 9.3 09/28/2015 1030   ALKPHOS 56 09/28/2015 1030   AST 16 09/28/2015 1030   ALT 10 09/28/2015 1030   BILITOT 0.40 09/28/2015 1030       Lab Results  Component Value Date   WBC 4.0 09/27/2016   HGB 11.1 (L) 09/27/2016   HCT 34.8 09/27/2016   MCV 71.9 (L) 09/27/2016   PLT 109 (L) 09/27/2016   NEUTROABS 1.7 09/27/2016    ASSESSMENT & PLAN:  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 Patient is currently on antiestrogen therapy with tamoxifen that started September 2013.  Tamoxifen toxicities: No major side effects or concerns  Breast Cancer Surveillance: 1. Breast exam 09/27/2016: Normal 2. Mammogram 10/14/2014 No abnormalities. Postsurgical changes.  Microcytic anemia Iron studies done December 2015 revealed normal iron studies with elevated ferritin, and that time hemoglobin was improving up to 11.4 in January was up to 14 g.   Today's blood work shows Hemoglobin is 11.1 with an MCV of 72 and platelet count of 109: I discussed with her that these levels are stable.  There is no indication to do anything further about this and this time. we will monitor this once a year. Given her advanced age we elected not to pursue bone marrow biopsy. We will treat her conservatively   Return to clinic in 1 year for follow-up labs In long-term survivorship clinic for follow-up  I spent 25 minutes talking to the patient of which more than half was spent in counseling and coordination of care.  No orders of the defined types were placed in this encounter.  The patient has a good understanding of the overall plan. she agrees with it. she will call with any problems that may develop before the next visit here.   Rulon Eisenmenger, MD 09/27/16

## 2016-10-24 DIAGNOSIS — I87302 Chronic venous hypertension (idiopathic) without complications of left lower extremity: Secondary | ICD-10-CM | POA: Diagnosis not present

## 2016-10-24 DIAGNOSIS — K580 Irritable bowel syndrome with diarrhea: Secondary | ICD-10-CM | POA: Diagnosis not present

## 2016-12-05 DIAGNOSIS — K591 Functional diarrhea: Secondary | ICD-10-CM | POA: Diagnosis not present

## 2016-12-05 DIAGNOSIS — I10 Essential (primary) hypertension: Secondary | ICD-10-CM | POA: Diagnosis not present

## 2016-12-05 DIAGNOSIS — F99 Mental disorder, not otherwise specified: Secondary | ICD-10-CM | POA: Diagnosis not present

## 2016-12-12 DIAGNOSIS — R921 Mammographic calcification found on diagnostic imaging of breast: Secondary | ICD-10-CM | POA: Diagnosis not present

## 2016-12-12 DIAGNOSIS — Z853 Personal history of malignant neoplasm of breast: Secondary | ICD-10-CM | POA: Diagnosis not present

## 2017-01-16 DIAGNOSIS — F99 Mental disorder, not otherwise specified: Secondary | ICD-10-CM | POA: Diagnosis not present

## 2017-01-16 DIAGNOSIS — K580 Irritable bowel syndrome with diarrhea: Secondary | ICD-10-CM | POA: Diagnosis not present

## 2017-01-16 DIAGNOSIS — I10 Essential (primary) hypertension: Secondary | ICD-10-CM | POA: Diagnosis not present

## 2017-01-16 DIAGNOSIS — Z23 Encounter for immunization: Secondary | ICD-10-CM | POA: Diagnosis not present

## 2017-02-12 DIAGNOSIS — H18232 Secondary corneal edema, left eye: Secondary | ICD-10-CM | POA: Diagnosis not present

## 2017-02-12 DIAGNOSIS — H401131 Primary open-angle glaucoma, bilateral, mild stage: Secondary | ICD-10-CM | POA: Diagnosis not present

## 2017-02-12 DIAGNOSIS — H16223 Keratoconjunctivitis sicca, not specified as Sjogren's, bilateral: Secondary | ICD-10-CM | POA: Diagnosis not present

## 2017-02-20 DIAGNOSIS — I87302 Chronic venous hypertension (idiopathic) without complications of left lower extremity: Secondary | ICD-10-CM | POA: Diagnosis not present

## 2017-02-20 DIAGNOSIS — I10 Essential (primary) hypertension: Secondary | ICD-10-CM | POA: Diagnosis not present

## 2017-04-22 DIAGNOSIS — I10 Essential (primary) hypertension: Secondary | ICD-10-CM | POA: Diagnosis not present

## 2017-04-22 DIAGNOSIS — F064 Anxiety disorder due to known physiological condition: Secondary | ICD-10-CM | POA: Diagnosis not present

## 2017-04-22 DIAGNOSIS — I749 Embolism and thrombosis of unspecified artery: Secondary | ICD-10-CM | POA: Diagnosis not present

## 2017-04-22 DIAGNOSIS — I87302 Chronic venous hypertension (idiopathic) without complications of left lower extremity: Secondary | ICD-10-CM | POA: Diagnosis not present

## 2017-08-21 DIAGNOSIS — H401131 Primary open-angle glaucoma, bilateral, mild stage: Secondary | ICD-10-CM | POA: Diagnosis not present

## 2017-08-21 DIAGNOSIS — H18232 Secondary corneal edema, left eye: Secondary | ICD-10-CM | POA: Diagnosis not present

## 2017-08-21 DIAGNOSIS — H16223 Keratoconjunctivitis sicca, not specified as Sjogren's, bilateral: Secondary | ICD-10-CM | POA: Diagnosis not present

## 2017-09-24 DIAGNOSIS — Z6825 Body mass index (BMI) 25.0-25.9, adult: Secondary | ICD-10-CM | POA: Diagnosis not present

## 2017-09-24 DIAGNOSIS — R609 Edema, unspecified: Secondary | ICD-10-CM | POA: Diagnosis not present

## 2017-09-24 DIAGNOSIS — F064 Anxiety disorder due to known physiological condition: Secondary | ICD-10-CM | POA: Diagnosis not present

## 2017-09-24 DIAGNOSIS — I10 Essential (primary) hypertension: Secondary | ICD-10-CM | POA: Diagnosis not present

## 2017-09-24 DIAGNOSIS — E039 Hypothyroidism, unspecified: Secondary | ICD-10-CM | POA: Diagnosis not present

## 2017-09-29 ENCOUNTER — Inpatient Hospital Stay: Payer: Medicare HMO | Attending: Adult Health | Admitting: Adult Health

## 2017-09-29 ENCOUNTER — Inpatient Hospital Stay: Payer: Medicare HMO

## 2017-10-13 DIAGNOSIS — Z0000 Encounter for general adult medical examination without abnormal findings: Secondary | ICD-10-CM | POA: Diagnosis not present

## 2017-11-25 DIAGNOSIS — F064 Anxiety disorder due to known physiological condition: Secondary | ICD-10-CM | POA: Diagnosis not present

## 2017-11-25 DIAGNOSIS — I10 Essential (primary) hypertension: Secondary | ICD-10-CM | POA: Diagnosis not present

## 2017-11-25 DIAGNOSIS — R627 Adult failure to thrive: Secondary | ICD-10-CM | POA: Diagnosis not present

## 2017-11-25 DIAGNOSIS — F99 Mental disorder, not otherwise specified: Secondary | ICD-10-CM | POA: Diagnosis not present

## 2017-11-25 DIAGNOSIS — Z6824 Body mass index (BMI) 24.0-24.9, adult: Secondary | ICD-10-CM | POA: Diagnosis not present

## 2017-12-16 DIAGNOSIS — F064 Anxiety disorder due to known physiological condition: Secondary | ICD-10-CM | POA: Diagnosis not present

## 2017-12-16 DIAGNOSIS — I10 Essential (primary) hypertension: Secondary | ICD-10-CM | POA: Diagnosis not present

## 2017-12-16 DIAGNOSIS — R627 Adult failure to thrive: Secondary | ICD-10-CM | POA: Diagnosis not present

## 2017-12-17 DIAGNOSIS — Z853 Personal history of malignant neoplasm of breast: Secondary | ICD-10-CM | POA: Diagnosis not present

## 2017-12-17 DIAGNOSIS — Z1231 Encounter for screening mammogram for malignant neoplasm of breast: Secondary | ICD-10-CM | POA: Diagnosis not present

## 2017-12-23 DIAGNOSIS — H548 Legal blindness, as defined in USA: Secondary | ICD-10-CM | POA: Diagnosis not present

## 2017-12-23 DIAGNOSIS — R159 Full incontinence of feces: Secondary | ICD-10-CM | POA: Diagnosis not present

## 2017-12-23 DIAGNOSIS — Z9181 History of falling: Secondary | ICD-10-CM | POA: Diagnosis not present

## 2017-12-23 DIAGNOSIS — R262 Difficulty in walking, not elsewhere classified: Secondary | ICD-10-CM | POA: Diagnosis not present

## 2017-12-23 DIAGNOSIS — R413 Other amnesia: Secondary | ICD-10-CM | POA: Diagnosis not present

## 2017-12-23 DIAGNOSIS — R32 Unspecified urinary incontinence: Secondary | ICD-10-CM | POA: Diagnosis not present

## 2017-12-25 DIAGNOSIS — H401131 Primary open-angle glaucoma, bilateral, mild stage: Secondary | ICD-10-CM | POA: Diagnosis not present

## 2017-12-25 DIAGNOSIS — H18232 Secondary corneal edema, left eye: Secondary | ICD-10-CM | POA: Diagnosis not present

## 2017-12-25 DIAGNOSIS — H16223 Keratoconjunctivitis sicca, not specified as Sjogren's, bilateral: Secondary | ICD-10-CM | POA: Diagnosis not present

## 2017-12-26 DIAGNOSIS — R262 Difficulty in walking, not elsewhere classified: Secondary | ICD-10-CM | POA: Diagnosis not present

## 2017-12-26 DIAGNOSIS — Z9181 History of falling: Secondary | ICD-10-CM | POA: Diagnosis not present

## 2017-12-26 DIAGNOSIS — H548 Legal blindness, as defined in USA: Secondary | ICD-10-CM | POA: Diagnosis not present

## 2017-12-26 DIAGNOSIS — R32 Unspecified urinary incontinence: Secondary | ICD-10-CM | POA: Diagnosis not present

## 2017-12-26 DIAGNOSIS — R159 Full incontinence of feces: Secondary | ICD-10-CM | POA: Diagnosis not present

## 2017-12-26 DIAGNOSIS — R413 Other amnesia: Secondary | ICD-10-CM | POA: Diagnosis not present

## 2017-12-31 DIAGNOSIS — Z9181 History of falling: Secondary | ICD-10-CM | POA: Diagnosis not present

## 2017-12-31 DIAGNOSIS — H548 Legal blindness, as defined in USA: Secondary | ICD-10-CM | POA: Diagnosis not present

## 2017-12-31 DIAGNOSIS — R413 Other amnesia: Secondary | ICD-10-CM | POA: Diagnosis not present

## 2017-12-31 DIAGNOSIS — R159 Full incontinence of feces: Secondary | ICD-10-CM | POA: Diagnosis not present

## 2017-12-31 DIAGNOSIS — R262 Difficulty in walking, not elsewhere classified: Secondary | ICD-10-CM | POA: Diagnosis not present

## 2017-12-31 DIAGNOSIS — R32 Unspecified urinary incontinence: Secondary | ICD-10-CM | POA: Diagnosis not present

## 2018-01-07 DIAGNOSIS — Z9181 History of falling: Secondary | ICD-10-CM | POA: Diagnosis not present

## 2018-01-07 DIAGNOSIS — R32 Unspecified urinary incontinence: Secondary | ICD-10-CM | POA: Diagnosis not present

## 2018-01-07 DIAGNOSIS — R159 Full incontinence of feces: Secondary | ICD-10-CM | POA: Diagnosis not present

## 2018-01-07 DIAGNOSIS — R413 Other amnesia: Secondary | ICD-10-CM | POA: Diagnosis not present

## 2018-01-07 DIAGNOSIS — H548 Legal blindness, as defined in USA: Secondary | ICD-10-CM | POA: Diagnosis not present

## 2018-01-07 DIAGNOSIS — R262 Difficulty in walking, not elsewhere classified: Secondary | ICD-10-CM | POA: Diagnosis not present

## 2018-01-16 DIAGNOSIS — I10 Essential (primary) hypertension: Secondary | ICD-10-CM | POA: Diagnosis not present

## 2018-01-16 DIAGNOSIS — K591 Functional diarrhea: Secondary | ICD-10-CM | POA: Diagnosis not present

## 2018-01-16 DIAGNOSIS — Z6824 Body mass index (BMI) 24.0-24.9, adult: Secondary | ICD-10-CM | POA: Diagnosis not present

## 2018-01-16 DIAGNOSIS — G3184 Mild cognitive impairment, so stated: Secondary | ICD-10-CM | POA: Diagnosis not present

## 2018-01-16 DIAGNOSIS — R634 Abnormal weight loss: Secondary | ICD-10-CM | POA: Diagnosis not present

## 2018-01-26 DIAGNOSIS — R262 Difficulty in walking, not elsewhere classified: Secondary | ICD-10-CM | POA: Diagnosis not present

## 2018-01-26 DIAGNOSIS — Z9181 History of falling: Secondary | ICD-10-CM | POA: Diagnosis not present

## 2018-01-26 DIAGNOSIS — R159 Full incontinence of feces: Secondary | ICD-10-CM | POA: Diagnosis not present

## 2018-01-26 DIAGNOSIS — H548 Legal blindness, as defined in USA: Secondary | ICD-10-CM | POA: Diagnosis not present

## 2018-01-26 DIAGNOSIS — R32 Unspecified urinary incontinence: Secondary | ICD-10-CM | POA: Diagnosis not present

## 2018-01-26 DIAGNOSIS — R413 Other amnesia: Secondary | ICD-10-CM | POA: Diagnosis not present

## 2018-01-29 DIAGNOSIS — R159 Full incontinence of feces: Secondary | ICD-10-CM | POA: Diagnosis not present

## 2018-01-29 DIAGNOSIS — R262 Difficulty in walking, not elsewhere classified: Secondary | ICD-10-CM | POA: Diagnosis not present

## 2018-01-29 DIAGNOSIS — H548 Legal blindness, as defined in USA: Secondary | ICD-10-CM | POA: Diagnosis not present

## 2018-01-29 DIAGNOSIS — R32 Unspecified urinary incontinence: Secondary | ICD-10-CM | POA: Diagnosis not present

## 2018-01-29 DIAGNOSIS — Z9181 History of falling: Secondary | ICD-10-CM | POA: Diagnosis not present

## 2018-01-29 DIAGNOSIS — R413 Other amnesia: Secondary | ICD-10-CM | POA: Diagnosis not present

## 2018-02-03 DIAGNOSIS — R262 Difficulty in walking, not elsewhere classified: Secondary | ICD-10-CM | POA: Diagnosis not present

## 2018-02-03 DIAGNOSIS — R413 Other amnesia: Secondary | ICD-10-CM | POA: Diagnosis not present

## 2018-02-03 DIAGNOSIS — H548 Legal blindness, as defined in USA: Secondary | ICD-10-CM | POA: Diagnosis not present

## 2018-02-03 DIAGNOSIS — R159 Full incontinence of feces: Secondary | ICD-10-CM | POA: Diagnosis not present

## 2018-02-03 DIAGNOSIS — Z9181 History of falling: Secondary | ICD-10-CM | POA: Diagnosis not present

## 2018-02-03 DIAGNOSIS — R32 Unspecified urinary incontinence: Secondary | ICD-10-CM | POA: Diagnosis not present

## 2018-02-04 DIAGNOSIS — R413 Other amnesia: Secondary | ICD-10-CM | POA: Diagnosis not present

## 2018-02-04 DIAGNOSIS — R159 Full incontinence of feces: Secondary | ICD-10-CM | POA: Diagnosis not present

## 2018-02-04 DIAGNOSIS — R262 Difficulty in walking, not elsewhere classified: Secondary | ICD-10-CM | POA: Diagnosis not present

## 2018-02-04 DIAGNOSIS — H548 Legal blindness, as defined in USA: Secondary | ICD-10-CM | POA: Diagnosis not present

## 2018-02-04 DIAGNOSIS — R32 Unspecified urinary incontinence: Secondary | ICD-10-CM | POA: Diagnosis not present

## 2018-02-04 DIAGNOSIS — Z9181 History of falling: Secondary | ICD-10-CM | POA: Diagnosis not present

## 2018-02-09 DIAGNOSIS — R413 Other amnesia: Secondary | ICD-10-CM | POA: Diagnosis not present

## 2018-02-09 DIAGNOSIS — R32 Unspecified urinary incontinence: Secondary | ICD-10-CM | POA: Diagnosis not present

## 2018-02-09 DIAGNOSIS — H548 Legal blindness, as defined in USA: Secondary | ICD-10-CM | POA: Diagnosis not present

## 2018-02-09 DIAGNOSIS — R262 Difficulty in walking, not elsewhere classified: Secondary | ICD-10-CM | POA: Diagnosis not present

## 2018-02-09 DIAGNOSIS — R159 Full incontinence of feces: Secondary | ICD-10-CM | POA: Diagnosis not present

## 2018-02-09 DIAGNOSIS — Z9181 History of falling: Secondary | ICD-10-CM | POA: Diagnosis not present

## 2018-02-10 DIAGNOSIS — R262 Difficulty in walking, not elsewhere classified: Secondary | ICD-10-CM | POA: Diagnosis not present

## 2018-02-10 DIAGNOSIS — Z9181 History of falling: Secondary | ICD-10-CM | POA: Diagnosis not present

## 2018-02-10 DIAGNOSIS — R32 Unspecified urinary incontinence: Secondary | ICD-10-CM | POA: Diagnosis not present

## 2018-02-10 DIAGNOSIS — H548 Legal blindness, as defined in USA: Secondary | ICD-10-CM | POA: Diagnosis not present

## 2018-02-10 DIAGNOSIS — R159 Full incontinence of feces: Secondary | ICD-10-CM | POA: Diagnosis not present

## 2018-02-10 DIAGNOSIS — R413 Other amnesia: Secondary | ICD-10-CM | POA: Diagnosis not present

## 2018-02-17 DIAGNOSIS — I10 Essential (primary) hypertension: Secondary | ICD-10-CM | POA: Diagnosis not present

## 2018-02-17 DIAGNOSIS — Z6824 Body mass index (BMI) 24.0-24.9, adult: Secondary | ICD-10-CM | POA: Diagnosis not present

## 2018-02-17 DIAGNOSIS — I87302 Chronic venous hypertension (idiopathic) without complications of left lower extremity: Secondary | ICD-10-CM | POA: Diagnosis not present

## 2018-02-18 DIAGNOSIS — Z9181 History of falling: Secondary | ICD-10-CM | POA: Diagnosis not present

## 2018-02-18 DIAGNOSIS — R159 Full incontinence of feces: Secondary | ICD-10-CM | POA: Diagnosis not present

## 2018-02-18 DIAGNOSIS — H548 Legal blindness, as defined in USA: Secondary | ICD-10-CM | POA: Diagnosis not present

## 2018-02-18 DIAGNOSIS — R413 Other amnesia: Secondary | ICD-10-CM | POA: Diagnosis not present

## 2018-02-18 DIAGNOSIS — R32 Unspecified urinary incontinence: Secondary | ICD-10-CM | POA: Diagnosis not present

## 2018-02-18 DIAGNOSIS — R262 Difficulty in walking, not elsewhere classified: Secondary | ICD-10-CM | POA: Diagnosis not present

## 2018-02-19 DIAGNOSIS — H548 Legal blindness, as defined in USA: Secondary | ICD-10-CM | POA: Diagnosis not present

## 2018-02-19 DIAGNOSIS — Z9181 History of falling: Secondary | ICD-10-CM | POA: Diagnosis not present

## 2018-02-19 DIAGNOSIS — R32 Unspecified urinary incontinence: Secondary | ICD-10-CM | POA: Diagnosis not present

## 2018-02-19 DIAGNOSIS — R262 Difficulty in walking, not elsewhere classified: Secondary | ICD-10-CM | POA: Diagnosis not present

## 2018-02-19 DIAGNOSIS — R159 Full incontinence of feces: Secondary | ICD-10-CM | POA: Diagnosis not present

## 2018-02-19 DIAGNOSIS — R413 Other amnesia: Secondary | ICD-10-CM | POA: Diagnosis not present

## 2018-02-20 DIAGNOSIS — R262 Difficulty in walking, not elsewhere classified: Secondary | ICD-10-CM | POA: Diagnosis not present

## 2018-02-20 DIAGNOSIS — Z9181 History of falling: Secondary | ICD-10-CM | POA: Diagnosis not present

## 2018-02-20 DIAGNOSIS — R413 Other amnesia: Secondary | ICD-10-CM | POA: Diagnosis not present

## 2018-02-20 DIAGNOSIS — H548 Legal blindness, as defined in USA: Secondary | ICD-10-CM | POA: Diagnosis not present

## 2018-02-20 DIAGNOSIS — R159 Full incontinence of feces: Secondary | ICD-10-CM | POA: Diagnosis not present

## 2018-02-20 DIAGNOSIS — R32 Unspecified urinary incontinence: Secondary | ICD-10-CM | POA: Diagnosis not present

## 2018-02-24 DIAGNOSIS — R413 Other amnesia: Secondary | ICD-10-CM | POA: Diagnosis not present

## 2018-02-24 DIAGNOSIS — Z9181 History of falling: Secondary | ICD-10-CM | POA: Diagnosis not present

## 2018-02-24 DIAGNOSIS — R159 Full incontinence of feces: Secondary | ICD-10-CM | POA: Diagnosis not present

## 2018-02-24 DIAGNOSIS — R262 Difficulty in walking, not elsewhere classified: Secondary | ICD-10-CM | POA: Diagnosis not present

## 2018-02-24 DIAGNOSIS — H548 Legal blindness, as defined in USA: Secondary | ICD-10-CM | POA: Diagnosis not present

## 2018-02-24 DIAGNOSIS — R32 Unspecified urinary incontinence: Secondary | ICD-10-CM | POA: Diagnosis not present

## 2018-03-03 DIAGNOSIS — R32 Unspecified urinary incontinence: Secondary | ICD-10-CM | POA: Diagnosis not present

## 2018-03-03 DIAGNOSIS — R159 Full incontinence of feces: Secondary | ICD-10-CM | POA: Diagnosis not present

## 2018-03-03 DIAGNOSIS — H548 Legal blindness, as defined in USA: Secondary | ICD-10-CM | POA: Diagnosis not present

## 2018-03-03 DIAGNOSIS — Z9181 History of falling: Secondary | ICD-10-CM | POA: Diagnosis not present

## 2018-03-03 DIAGNOSIS — R413 Other amnesia: Secondary | ICD-10-CM | POA: Diagnosis not present

## 2018-03-03 DIAGNOSIS — R262 Difficulty in walking, not elsewhere classified: Secondary | ICD-10-CM | POA: Diagnosis not present

## 2018-03-09 DIAGNOSIS — R32 Unspecified urinary incontinence: Secondary | ICD-10-CM | POA: Diagnosis not present

## 2018-03-09 DIAGNOSIS — R413 Other amnesia: Secondary | ICD-10-CM | POA: Diagnosis not present

## 2018-03-09 DIAGNOSIS — H548 Legal blindness, as defined in USA: Secondary | ICD-10-CM | POA: Diagnosis not present

## 2018-03-09 DIAGNOSIS — R262 Difficulty in walking, not elsewhere classified: Secondary | ICD-10-CM | POA: Diagnosis not present

## 2018-03-09 DIAGNOSIS — Z9181 History of falling: Secondary | ICD-10-CM | POA: Diagnosis not present

## 2018-03-09 DIAGNOSIS — R159 Full incontinence of feces: Secondary | ICD-10-CM | POA: Diagnosis not present

## 2018-03-16 DIAGNOSIS — I10 Essential (primary) hypertension: Secondary | ICD-10-CM | POA: Diagnosis not present

## 2018-03-16 DIAGNOSIS — R159 Full incontinence of feces: Secondary | ICD-10-CM | POA: Diagnosis not present

## 2018-03-16 DIAGNOSIS — H548 Legal blindness, as defined in USA: Secondary | ICD-10-CM | POA: Diagnosis not present

## 2018-03-16 DIAGNOSIS — Z9181 History of falling: Secondary | ICD-10-CM | POA: Diagnosis not present

## 2018-03-16 DIAGNOSIS — R41841 Cognitive communication deficit: Secondary | ICD-10-CM | POA: Diagnosis not present

## 2018-03-16 DIAGNOSIS — R32 Unspecified urinary incontinence: Secondary | ICD-10-CM | POA: Diagnosis not present

## 2018-03-16 DIAGNOSIS — R413 Other amnesia: Secondary | ICD-10-CM | POA: Diagnosis not present

## 2018-03-17 DIAGNOSIS — Z9181 History of falling: Secondary | ICD-10-CM | POA: Diagnosis not present

## 2018-03-17 DIAGNOSIS — R159 Full incontinence of feces: Secondary | ICD-10-CM | POA: Diagnosis not present

## 2018-03-17 DIAGNOSIS — R41841 Cognitive communication deficit: Secondary | ICD-10-CM | POA: Diagnosis not present

## 2018-03-17 DIAGNOSIS — H548 Legal blindness, as defined in USA: Secondary | ICD-10-CM | POA: Diagnosis not present

## 2018-03-17 DIAGNOSIS — R413 Other amnesia: Secondary | ICD-10-CM | POA: Diagnosis not present

## 2018-03-17 DIAGNOSIS — I10 Essential (primary) hypertension: Secondary | ICD-10-CM | POA: Diagnosis not present

## 2018-03-17 DIAGNOSIS — R32 Unspecified urinary incontinence: Secondary | ICD-10-CM | POA: Diagnosis not present

## 2018-03-19 DIAGNOSIS — I10 Essential (primary) hypertension: Secondary | ICD-10-CM | POA: Diagnosis not present

## 2018-03-19 DIAGNOSIS — Z9181 History of falling: Secondary | ICD-10-CM | POA: Diagnosis not present

## 2018-03-19 DIAGNOSIS — R413 Other amnesia: Secondary | ICD-10-CM | POA: Diagnosis not present

## 2018-03-19 DIAGNOSIS — R159 Full incontinence of feces: Secondary | ICD-10-CM | POA: Diagnosis not present

## 2018-03-19 DIAGNOSIS — R41841 Cognitive communication deficit: Secondary | ICD-10-CM | POA: Diagnosis not present

## 2018-03-19 DIAGNOSIS — R32 Unspecified urinary incontinence: Secondary | ICD-10-CM | POA: Diagnosis not present

## 2018-03-19 DIAGNOSIS — H548 Legal blindness, as defined in USA: Secondary | ICD-10-CM | POA: Diagnosis not present

## 2018-03-23 DIAGNOSIS — R159 Full incontinence of feces: Secondary | ICD-10-CM | POA: Diagnosis not present

## 2018-03-23 DIAGNOSIS — H548 Legal blindness, as defined in USA: Secondary | ICD-10-CM | POA: Diagnosis not present

## 2018-03-23 DIAGNOSIS — R413 Other amnesia: Secondary | ICD-10-CM | POA: Diagnosis not present

## 2018-03-23 DIAGNOSIS — R32 Unspecified urinary incontinence: Secondary | ICD-10-CM | POA: Diagnosis not present

## 2018-03-23 DIAGNOSIS — Z9181 History of falling: Secondary | ICD-10-CM | POA: Diagnosis not present

## 2018-03-23 DIAGNOSIS — R41841 Cognitive communication deficit: Secondary | ICD-10-CM | POA: Diagnosis not present

## 2018-03-23 DIAGNOSIS — I10 Essential (primary) hypertension: Secondary | ICD-10-CM | POA: Diagnosis not present

## 2018-03-24 DIAGNOSIS — I10 Essential (primary) hypertension: Secondary | ICD-10-CM | POA: Diagnosis not present

## 2018-03-24 DIAGNOSIS — R413 Other amnesia: Secondary | ICD-10-CM | POA: Diagnosis not present

## 2018-03-24 DIAGNOSIS — R159 Full incontinence of feces: Secondary | ICD-10-CM | POA: Diagnosis not present

## 2018-03-24 DIAGNOSIS — Z9181 History of falling: Secondary | ICD-10-CM | POA: Diagnosis not present

## 2018-03-24 DIAGNOSIS — R32 Unspecified urinary incontinence: Secondary | ICD-10-CM | POA: Diagnosis not present

## 2018-03-24 DIAGNOSIS — H548 Legal blindness, as defined in USA: Secondary | ICD-10-CM | POA: Diagnosis not present

## 2018-03-24 DIAGNOSIS — R41841 Cognitive communication deficit: Secondary | ICD-10-CM | POA: Diagnosis not present

## 2018-03-25 DIAGNOSIS — I10 Essential (primary) hypertension: Secondary | ICD-10-CM | POA: Diagnosis not present

## 2018-03-25 DIAGNOSIS — R413 Other amnesia: Secondary | ICD-10-CM | POA: Diagnosis not present

## 2018-03-25 DIAGNOSIS — Z9181 History of falling: Secondary | ICD-10-CM | POA: Diagnosis not present

## 2018-03-25 DIAGNOSIS — R32 Unspecified urinary incontinence: Secondary | ICD-10-CM | POA: Diagnosis not present

## 2018-03-25 DIAGNOSIS — R159 Full incontinence of feces: Secondary | ICD-10-CM | POA: Diagnosis not present

## 2018-03-25 DIAGNOSIS — R41841 Cognitive communication deficit: Secondary | ICD-10-CM | POA: Diagnosis not present

## 2018-03-25 DIAGNOSIS — H548 Legal blindness, as defined in USA: Secondary | ICD-10-CM | POA: Diagnosis not present

## 2018-03-27 DIAGNOSIS — R32 Unspecified urinary incontinence: Secondary | ICD-10-CM | POA: Diagnosis not present

## 2018-03-27 DIAGNOSIS — R159 Full incontinence of feces: Secondary | ICD-10-CM | POA: Diagnosis not present

## 2018-03-27 DIAGNOSIS — R41841 Cognitive communication deficit: Secondary | ICD-10-CM | POA: Diagnosis not present

## 2018-03-27 DIAGNOSIS — H548 Legal blindness, as defined in USA: Secondary | ICD-10-CM | POA: Diagnosis not present

## 2018-03-27 DIAGNOSIS — R413 Other amnesia: Secondary | ICD-10-CM | POA: Diagnosis not present

## 2018-03-27 DIAGNOSIS — I10 Essential (primary) hypertension: Secondary | ICD-10-CM | POA: Diagnosis not present

## 2018-03-27 DIAGNOSIS — Z9181 History of falling: Secondary | ICD-10-CM | POA: Diagnosis not present

## 2018-03-30 DIAGNOSIS — R32 Unspecified urinary incontinence: Secondary | ICD-10-CM | POA: Diagnosis not present

## 2018-03-30 DIAGNOSIS — R413 Other amnesia: Secondary | ICD-10-CM | POA: Diagnosis not present

## 2018-03-30 DIAGNOSIS — H548 Legal blindness, as defined in USA: Secondary | ICD-10-CM | POA: Diagnosis not present

## 2018-03-30 DIAGNOSIS — Z9181 History of falling: Secondary | ICD-10-CM | POA: Diagnosis not present

## 2018-03-30 DIAGNOSIS — R159 Full incontinence of feces: Secondary | ICD-10-CM | POA: Diagnosis not present

## 2018-03-30 DIAGNOSIS — R41841 Cognitive communication deficit: Secondary | ICD-10-CM | POA: Diagnosis not present

## 2018-03-30 DIAGNOSIS — I10 Essential (primary) hypertension: Secondary | ICD-10-CM | POA: Diagnosis not present

## 2018-03-31 DIAGNOSIS — H548 Legal blindness, as defined in USA: Secondary | ICD-10-CM | POA: Diagnosis not present

## 2018-03-31 DIAGNOSIS — H5462 Unqualified visual loss, left eye, normal vision right eye: Secondary | ICD-10-CM | POA: Diagnosis not present

## 2018-03-31 DIAGNOSIS — Z6823 Body mass index (BMI) 23.0-23.9, adult: Secondary | ICD-10-CM | POA: Diagnosis not present

## 2018-03-31 DIAGNOSIS — F99 Mental disorder, not otherwise specified: Secondary | ICD-10-CM | POA: Diagnosis not present

## 2018-03-31 DIAGNOSIS — R413 Other amnesia: Secondary | ICD-10-CM | POA: Diagnosis not present

## 2018-03-31 DIAGNOSIS — I10 Essential (primary) hypertension: Secondary | ICD-10-CM | POA: Diagnosis not present

## 2018-03-31 DIAGNOSIS — E039 Hypothyroidism, unspecified: Secondary | ICD-10-CM | POA: Diagnosis not present

## 2018-03-31 DIAGNOSIS — R32 Unspecified urinary incontinence: Secondary | ICD-10-CM | POA: Diagnosis not present

## 2018-03-31 DIAGNOSIS — R159 Full incontinence of feces: Secondary | ICD-10-CM | POA: Diagnosis not present

## 2018-03-31 DIAGNOSIS — Z9181 History of falling: Secondary | ICD-10-CM | POA: Diagnosis not present

## 2018-03-31 DIAGNOSIS — R41841 Cognitive communication deficit: Secondary | ICD-10-CM | POA: Diagnosis not present

## 2018-04-01 DIAGNOSIS — Z9181 History of falling: Secondary | ICD-10-CM | POA: Diagnosis not present

## 2018-04-01 DIAGNOSIS — H548 Legal blindness, as defined in USA: Secondary | ICD-10-CM | POA: Diagnosis not present

## 2018-04-01 DIAGNOSIS — R32 Unspecified urinary incontinence: Secondary | ICD-10-CM | POA: Diagnosis not present

## 2018-04-01 DIAGNOSIS — R413 Other amnesia: Secondary | ICD-10-CM | POA: Diagnosis not present

## 2018-04-01 DIAGNOSIS — R41841 Cognitive communication deficit: Secondary | ICD-10-CM | POA: Diagnosis not present

## 2018-04-01 DIAGNOSIS — I10 Essential (primary) hypertension: Secondary | ICD-10-CM | POA: Diagnosis not present

## 2018-04-01 DIAGNOSIS — R159 Full incontinence of feces: Secondary | ICD-10-CM | POA: Diagnosis not present

## 2018-04-03 DIAGNOSIS — R32 Unspecified urinary incontinence: Secondary | ICD-10-CM | POA: Diagnosis not present

## 2018-04-03 DIAGNOSIS — R413 Other amnesia: Secondary | ICD-10-CM | POA: Diagnosis not present

## 2018-04-03 DIAGNOSIS — H548 Legal blindness, as defined in USA: Secondary | ICD-10-CM | POA: Diagnosis not present

## 2018-04-03 DIAGNOSIS — R159 Full incontinence of feces: Secondary | ICD-10-CM | POA: Diagnosis not present

## 2018-04-03 DIAGNOSIS — Z9181 History of falling: Secondary | ICD-10-CM | POA: Diagnosis not present

## 2018-04-03 DIAGNOSIS — R41841 Cognitive communication deficit: Secondary | ICD-10-CM | POA: Diagnosis not present

## 2018-04-03 DIAGNOSIS — I10 Essential (primary) hypertension: Secondary | ICD-10-CM | POA: Diagnosis not present

## 2018-04-06 DIAGNOSIS — R41841 Cognitive communication deficit: Secondary | ICD-10-CM | POA: Diagnosis not present

## 2018-04-06 DIAGNOSIS — R159 Full incontinence of feces: Secondary | ICD-10-CM | POA: Diagnosis not present

## 2018-04-06 DIAGNOSIS — H548 Legal blindness, as defined in USA: Secondary | ICD-10-CM | POA: Diagnosis not present

## 2018-04-06 DIAGNOSIS — R32 Unspecified urinary incontinence: Secondary | ICD-10-CM | POA: Diagnosis not present

## 2018-04-06 DIAGNOSIS — Z9181 History of falling: Secondary | ICD-10-CM | POA: Diagnosis not present

## 2018-04-06 DIAGNOSIS — I10 Essential (primary) hypertension: Secondary | ICD-10-CM | POA: Diagnosis not present

## 2018-04-06 DIAGNOSIS — R413 Other amnesia: Secondary | ICD-10-CM | POA: Diagnosis not present

## 2018-05-29 DIAGNOSIS — R739 Hyperglycemia, unspecified: Secondary | ICD-10-CM | POA: Diagnosis not present

## 2018-05-29 DIAGNOSIS — I10 Essential (primary) hypertension: Secondary | ICD-10-CM | POA: Diagnosis not present

## 2018-05-29 DIAGNOSIS — E7800 Pure hypercholesterolemia, unspecified: Secondary | ICD-10-CM | POA: Diagnosis not present

## 2018-05-29 DIAGNOSIS — E785 Hyperlipidemia, unspecified: Secondary | ICD-10-CM | POA: Diagnosis not present

## 2018-07-13 ENCOUNTER — Emergency Department (HOSPITAL_COMMUNITY): Payer: Medicare HMO

## 2018-07-13 ENCOUNTER — Inpatient Hospital Stay (HOSPITAL_COMMUNITY)
Admission: EM | Admit: 2018-07-13 | Discharge: 2018-07-20 | DRG: 682 | Disposition: A | Discharge status: Skilled Nursing Facility | Payer: Medicare HMO | Attending: Internal Medicine | Admitting: Internal Medicine

## 2018-07-13 ENCOUNTER — Other Ambulatory Visit: Payer: Self-pay

## 2018-07-13 DIAGNOSIS — Z832 Family history of diseases of the blood and blood-forming organs and certain disorders involving the immune mechanism: Secondary | ICD-10-CM

## 2018-07-13 DIAGNOSIS — M6281 Muscle weakness (generalized): Secondary | ICD-10-CM | POA: Diagnosis not present

## 2018-07-13 DIAGNOSIS — I452 Bifascicular block: Secondary | ICD-10-CM | POA: Diagnosis not present

## 2018-07-13 DIAGNOSIS — I499 Cardiac arrhythmia, unspecified: Secondary | ICD-10-CM | POA: Diagnosis not present

## 2018-07-13 DIAGNOSIS — R918 Other nonspecific abnormal finding of lung field: Secondary | ICD-10-CM | POA: Diagnosis not present

## 2018-07-13 DIAGNOSIS — F331 Major depressive disorder, recurrent, moderate: Secondary | ICD-10-CM | POA: Diagnosis not present

## 2018-07-13 DIAGNOSIS — R4182 Altered mental status, unspecified: Secondary | ICD-10-CM | POA: Diagnosis not present

## 2018-07-13 DIAGNOSIS — Z79899 Other long term (current) drug therapy: Secondary | ICD-10-CM

## 2018-07-13 DIAGNOSIS — N170 Acute kidney failure with tubular necrosis: Secondary | ICD-10-CM | POA: Diagnosis not present

## 2018-07-13 DIAGNOSIS — I361 Nonrheumatic tricuspid (valve) insufficiency: Secondary | ICD-10-CM | POA: Diagnosis not present

## 2018-07-13 DIAGNOSIS — R7989 Other specified abnormal findings of blood chemistry: Secondary | ICD-10-CM | POA: Diagnosis not present

## 2018-07-13 DIAGNOSIS — Z7189 Other specified counseling: Secondary | ICD-10-CM

## 2018-07-13 DIAGNOSIS — I480 Paroxysmal atrial fibrillation: Secondary | ICD-10-CM | POA: Diagnosis present

## 2018-07-13 DIAGNOSIS — D509 Iron deficiency anemia, unspecified: Secondary | ICD-10-CM | POA: Diagnosis present

## 2018-07-13 DIAGNOSIS — W1830XA Fall on same level, unspecified, initial encounter: Secondary | ICD-10-CM | POA: Diagnosis present

## 2018-07-13 DIAGNOSIS — Z1159 Encounter for screening for other viral diseases: Secondary | ICD-10-CM

## 2018-07-13 DIAGNOSIS — E876 Hypokalemia: Secondary | ICD-10-CM | POA: Diagnosis not present

## 2018-07-13 DIAGNOSIS — R410 Disorientation, unspecified: Secondary | ICD-10-CM | POA: Diagnosis not present

## 2018-07-13 DIAGNOSIS — Z91011 Allergy to milk products: Secondary | ICD-10-CM | POA: Diagnosis not present

## 2018-07-13 DIAGNOSIS — E43 Unspecified severe protein-calorie malnutrition: Secondary | ICD-10-CM | POA: Diagnosis present

## 2018-07-13 DIAGNOSIS — E8729 Other acidosis: Secondary | ICD-10-CM | POA: Diagnosis present

## 2018-07-13 DIAGNOSIS — R748 Abnormal levels of other serum enzymes: Secondary | ICD-10-CM | POA: Diagnosis present

## 2018-07-13 DIAGNOSIS — E612 Magnesium deficiency: Secondary | ICD-10-CM | POA: Diagnosis not present

## 2018-07-13 DIAGNOSIS — E872 Acidosis, unspecified: Secondary | ICD-10-CM | POA: Diagnosis present

## 2018-07-13 DIAGNOSIS — R531 Weakness: Secondary | ICD-10-CM

## 2018-07-13 DIAGNOSIS — R778 Other specified abnormalities of plasma proteins: Secondary | ICD-10-CM | POA: Diagnosis present

## 2018-07-13 DIAGNOSIS — R0689 Other abnormalities of breathing: Secondary | ICD-10-CM | POA: Diagnosis not present

## 2018-07-13 DIAGNOSIS — Z853 Personal history of malignant neoplasm of breast: Secondary | ICD-10-CM

## 2018-07-13 DIAGNOSIS — E1165 Type 2 diabetes mellitus with hyperglycemia: Secondary | ICD-10-CM | POA: Diagnosis not present

## 2018-07-13 DIAGNOSIS — F419 Anxiety disorder, unspecified: Secondary | ICD-10-CM | POA: Diagnosis present

## 2018-07-13 DIAGNOSIS — N179 Acute kidney failure, unspecified: Secondary | ICD-10-CM | POA: Diagnosis present

## 2018-07-13 DIAGNOSIS — I959 Hypotension, unspecified: Secondary | ICD-10-CM | POA: Diagnosis present

## 2018-07-13 DIAGNOSIS — D696 Thrombocytopenia, unspecified: Secondary | ICD-10-CM | POA: Diagnosis present

## 2018-07-13 DIAGNOSIS — I4891 Unspecified atrial fibrillation: Secondary | ICD-10-CM | POA: Diagnosis present

## 2018-07-13 DIAGNOSIS — Z743 Need for continuous supervision: Secondary | ICD-10-CM | POA: Diagnosis not present

## 2018-07-13 DIAGNOSIS — E785 Hyperlipidemia, unspecified: Secondary | ICD-10-CM | POA: Diagnosis present

## 2018-07-13 DIAGNOSIS — Z515 Encounter for palliative care: Secondary | ICD-10-CM | POA: Diagnosis not present

## 2018-07-13 DIAGNOSIS — R279 Unspecified lack of coordination: Secondary | ICD-10-CM | POA: Diagnosis not present

## 2018-07-13 DIAGNOSIS — F0390 Unspecified dementia without behavioral disturbance: Secondary | ICD-10-CM | POA: Diagnosis present

## 2018-07-13 DIAGNOSIS — W19XXXA Unspecified fall, initial encounter: Secondary | ICD-10-CM

## 2018-07-13 DIAGNOSIS — I38 Endocarditis, valve unspecified: Secondary | ICD-10-CM | POA: Diagnosis not present

## 2018-07-13 DIAGNOSIS — F0391 Unspecified dementia with behavioral disturbance: Secondary | ICD-10-CM | POA: Diagnosis not present

## 2018-07-13 DIAGNOSIS — I10 Essential (primary) hypertension: Secondary | ICD-10-CM | POA: Diagnosis present

## 2018-07-13 DIAGNOSIS — Z833 Family history of diabetes mellitus: Secondary | ICD-10-CM | POA: Diagnosis not present

## 2018-07-13 DIAGNOSIS — M790 Rheumatism, unspecified: Secondary | ICD-10-CM | POA: Diagnosis present

## 2018-07-13 DIAGNOSIS — Z881 Allergy status to other antibiotic agents status: Secondary | ICD-10-CM

## 2018-07-13 DIAGNOSIS — I313 Pericardial effusion (noninflammatory): Secondary | ICD-10-CM | POA: Diagnosis present

## 2018-07-13 DIAGNOSIS — L8990 Pressure ulcer of unspecified site, unspecified stage: Secondary | ICD-10-CM

## 2018-07-13 DIAGNOSIS — G459 Transient cerebral ischemic attack, unspecified: Secondary | ICD-10-CM | POA: Diagnosis not present

## 2018-07-13 DIAGNOSIS — Y92009 Unspecified place in unspecified non-institutional (private) residence as the place of occurrence of the external cause: Secondary | ICD-10-CM

## 2018-07-13 DIAGNOSIS — Z96652 Presence of left artificial knee joint: Secondary | ICD-10-CM | POA: Diagnosis present

## 2018-07-13 DIAGNOSIS — D500 Iron deficiency anemia secondary to blood loss (chronic): Secondary | ICD-10-CM | POA: Diagnosis not present

## 2018-07-13 DIAGNOSIS — R000 Tachycardia, unspecified: Secondary | ICD-10-CM | POA: Diagnosis not present

## 2018-07-13 DIAGNOSIS — Z7982 Long term (current) use of aspirin: Secondary | ICD-10-CM

## 2018-07-13 DIAGNOSIS — Z96612 Presence of left artificial shoulder joint: Secondary | ICD-10-CM | POA: Diagnosis present

## 2018-07-13 LAB — LACTIC ACID, PLASMA
Lactic Acid, Venous: 4.3 mmol/L (ref 0.5–1.9)
Lactic Acid, Venous: 2.2 mmol/L (ref 0.5–1.9)

## 2018-07-13 LAB — COMPREHENSIVE METABOLIC PANEL
ALT: 100 U/L — ABNORMAL HIGH (ref 0–44)
AST: 160 U/L — ABNORMAL HIGH (ref 15–41)
Albumin: 3.2 g/dL — ABNORMAL LOW (ref 3.5–5.0)
Alkaline Phosphatase: 71 U/L (ref 38–126)
Anion gap: 20 — ABNORMAL HIGH (ref 5–15)
BUN: 56 mg/dL — ABNORMAL HIGH (ref 8–23)
CO2: 18 mmol/L — ABNORMAL LOW (ref 22–32)
Calcium: 9.3 mg/dL (ref 8.9–10.3)
Chloride: 101 mmol/L (ref 98–111)
Creatinine, Ser: 3.72 mg/dL — ABNORMAL HIGH (ref 0.44–1.00)
GFR calc Af Amer: 12 mL/min — ABNORMAL LOW (ref >60)
GFR calc non Af Amer: 10 mL/min — ABNORMAL LOW (ref >60)
Glucose, Bld: 244 mg/dL — ABNORMAL HIGH (ref 70–99)
Potassium: 3.2 mmol/L — ABNORMAL LOW (ref 3.5–5.1)
Sodium: 139 mmol/L (ref 135–145)
Total Bilirubin: 0.5 mg/dL (ref 0.3–1.2)
Total Protein: 6.6 g/dL (ref 6.5–8.1)

## 2018-07-13 LAB — URINALYSIS, ROUTINE W REFLEX MICROSCOPIC
Glucose, UA: NEGATIVE mg/dL
Ketones, ur: 15 mg/dL — AB
Leukocytes,Ua: NEGATIVE
Nitrite: NEGATIVE
Protein, ur: 100 mg/dL — AB
Specific Gravity, Urine: >1.03 — ABNORMAL HIGH (ref 1.005–1.030)
pH: 5 (ref 5.0–8.0)

## 2018-07-13 LAB — POCT I-STAT EG7
Acid-base deficit: 3 mmol/L — ABNORMAL HIGH (ref 0.0–2.0)
Bicarbonate: 22.1 mmol/L (ref 20.0–28.0)
Calcium, Ion: 1.16 mmol/L (ref 1.15–1.40)
HCT: 31 % — ABNORMAL LOW (ref 36.0–46.0)
Hemoglobin: 10.5 g/dL — ABNORMAL LOW (ref 12.0–15.0)
O2 Saturation: 70 %
Potassium: 3.6 mmol/L (ref 3.5–5.1)
Sodium: 139 mmol/L (ref 135–145)
TCO2: 23 mmol/L (ref 22–32)
pCO2, Ven: 37.8 mmHg — ABNORMAL LOW (ref 44.0–60.0)
pH, Ven: 7.374 (ref 7.250–7.430)
pO2, Ven: 37 mmHg (ref 32.0–45.0)

## 2018-07-13 LAB — CBC WITH DIFFERENTIAL/PLATELET
Abs Immature Granulocytes: 0.04 10*3/uL (ref 0.00–0.07)
Basophils Absolute: 0 10*3/uL (ref 0.0–0.1)
Basophils Relative: 0 %
Eosinophils Absolute: 0.1 10*3/uL (ref 0.0–0.5)
Eosinophils Relative: 1 %
HCT: 32.1 % — ABNORMAL LOW (ref 36.0–46.0)
Hemoglobin: 9.9 g/dL — ABNORMAL LOW (ref 12.0–15.0)
Immature Granulocytes: 1 %
Lymphocytes Relative: 13 %
Lymphs Abs: 1.2 10*3/uL (ref 0.7–4.0)
MCH: 22.5 pg — ABNORMAL LOW (ref 26.0–34.0)
MCHC: 30.8 g/dL (ref 30.0–36.0)
MCV: 73 fL — ABNORMAL LOW (ref 80.0–100.0)
Monocytes Absolute: 0.4 10*3/uL (ref 0.1–1.0)
Monocytes Relative: 5 %
Neutro Abs: 7.1 10*3/uL (ref 1.7–7.7)
Neutrophils Relative %: 80 %
Platelets: 125 10*3/uL — ABNORMAL LOW (ref 150–400)
RBC: 4.4 MIL/uL (ref 3.87–5.11)
RDW: 15 % (ref 11.5–15.5)
WBC: 8.8 10*3/uL (ref 4.0–10.5)
nRBC: 0 % (ref 0.0–0.2)

## 2018-07-13 LAB — HEMOGLOBIN A1C
Hgb A1c MFr Bld: 5.9 % — ABNORMAL HIGH (ref 4.8–5.6)
Mean Plasma Glucose: 122.63 mg/dL

## 2018-07-13 LAB — URINALYSIS, MICROSCOPIC (REFLEX)

## 2018-07-13 LAB — TROPONIN I
Troponin I: 0.08 ng/mL (ref <0.03)
Troponin I: 0.93 ng/mL (ref <0.03)

## 2018-07-13 LAB — SARS CORONAVIRUS 2 BY RT PCR (HOSPITAL ORDER, PERFORMED IN ~~LOC~~ HOSPITAL LAB): SARS Coronavirus 2: NEGATIVE

## 2018-07-13 MED ORDER — SODIUM CHLORIDE 0.9 % IV BOLUS
1000.0000 mL | Freq: Once | INTRAVENOUS | Status: AC
  Administered 2018-07-13: 1000 mL via INTRAVENOUS

## 2018-07-13 MED ORDER — TRAZODONE HCL 50 MG PO TABS: 25.0000 mg | ORAL_TABLET | Freq: Every evening | ORAL | Status: DC | PRN

## 2018-07-13 MED ORDER — METRONIDAZOLE IN NACL 5-0.79 MG/ML-% IV SOLN: 500.0000 mg | Freq: Once | INTRAVENOUS | Status: DC

## 2018-07-13 MED ORDER — VANCOMYCIN HCL IN DEXTROSE 1-5 GM/200ML-% IV SOLN
1000.0000 mg | Freq: Once | INTRAVENOUS | Status: AC
  Administered 2018-07-13: 1000 mg via INTRAVENOUS
  Filled 2018-07-13: qty 200

## 2018-07-13 MED ORDER — SENNA 8.6 MG PO TABS
1.0000 | ORAL_TABLET | Freq: Two times a day (BID) | ORAL | Status: DC
  Administered 2018-07-13 – 2018-07-20 (×11): 8.6 mg via ORAL
  Filled 2018-07-13 (×12): qty 1

## 2018-07-13 MED ORDER — SODIUM CHLORIDE 0.9 % IV SOLN: 2.0000 g | Freq: Once | INTRAVENOUS | Status: DC

## 2018-07-13 MED ORDER — POLYETHYLENE GLYCOL 3350 17 G PO PACK: 17.0000 g | PACK | Freq: Every day | ORAL | Status: DC | PRN

## 2018-07-13 MED ORDER — BOOST / RESOURCE BREEZE PO LIQD CUSTOM
1.0000 | Freq: Two times a day (BID) | ORAL | Status: DC
  Administered 2018-07-14 – 2018-07-19 (×11): 1 via ORAL

## 2018-07-13 MED ORDER — HEPARIN SODIUM (PORCINE) 5000 UNIT/ML IJ SOLN
5000.0000 [IU] | Freq: Three times a day (TID) | INTRAMUSCULAR | Status: DC
  Administered 2018-07-13 – 2018-07-20 (×20): 5000 [IU] via SUBCUTANEOUS
  Filled 2018-07-13 (×21): qty 1

## 2018-07-13 MED ORDER — SODIUM CHLORIDE 0.9 % IV SOLN
1.0000 g | INTRAVENOUS | Status: DC
  Administered 2018-07-13: 23:00:00 1 g via INTRAVENOUS
  Filled 2018-07-13 (×2): qty 1

## 2018-07-13 MED ORDER — DILTIAZEM HCL-DEXTROSE 100-5 MG/100ML-% IV SOLN (PREMIX)
5.0000 mg/h | INTRAVENOUS | Status: DC
  Filled 2018-07-13: qty 100

## 2018-07-13 MED ORDER — POTASSIUM CHLORIDE 10 MEQ/100ML IV SOLN
10.0000 meq | Freq: Once | INTRAVENOUS | Status: AC
  Administered 2018-07-13: 16:00:00 10 meq via INTRAVENOUS
  Filled 2018-07-13: qty 100

## 2018-07-13 MED ORDER — ACETAMINOPHEN 650 MG RE SUPP: 650.0000 mg | Freq: Four times a day (QID) | RECTAL | Status: DC | PRN

## 2018-07-13 MED ORDER — LACTATED RINGERS IV SOLN
INTRAVENOUS | Status: AC
  Administered 2018-07-14: 01:00:00 via INTRAVENOUS

## 2018-07-13 MED ORDER — ALPRAZOLAM 0.25 MG PO TABS: 0.2500 mg | ORAL_TABLET | Freq: Two times a day (BID) | ORAL | Status: DC | PRN

## 2018-07-13 MED ORDER — SODIUM CHLORIDE 0.9 % IV BOLUS
1000.0000 mL | Freq: Once | INTRAVENOUS | Status: AC
  Administered 2018-07-13: 14:00:00 1000 mL via INTRAVENOUS

## 2018-07-13 MED ORDER — DILTIAZEM LOAD VIA INFUSION
10.0000 mg | Freq: Once | INTRAVENOUS | Status: DC
  Filled 2018-07-13: qty 10

## 2018-07-13 MED ORDER — ACETAMINOPHEN 325 MG PO TABS: 650.0000 mg | ORAL_TABLET | Freq: Four times a day (QID) | ORAL | Status: DC | PRN

## 2018-07-13 MED ORDER — SODIUM CHLORIDE 0.9 % IV SOLN
INTRAVENOUS | Status: DC | PRN
  Administered 2018-07-13: 250 mL via INTRAVENOUS

## 2018-07-13 NOTE — H&P (Signed)
History and Physical    [place] Oborn UYQ:[id number] DOB: 26-Jan-1929 DOA: 07/13/2018  PCP: [name], MD Patient coming from: Home.  Lives alone.  Has cane and walker but does not use consistently.  Chief Complaint: Fall at home  HPI: Brianna Hunter is a 85 y.o. female with history of left breast DCIS status post lumpectomy treated with tamoxifen, hypertension, hyperlipidemia, dementia, anxiety, valvular heart disease and bifascicular block presenting with fall at home.  Patient is unreliable historian due to dementia.  She is awake and oriented to self and place but not time.  History obtained from patient's daughter over the phone although patient daughter does not live with her and hasn't seen her in the last 3 days.  Reportedly fell on her back twice at home this morning.  EMS was called and she was brought to ED.  Daughter reports recurrent fall recently.  She says she has been "down and not feeling well" since last week. Last saw her on Friday.  At that time she noted that her mother has not eaten any of the food she left her in the fridge a week ago.  Otherwise, she has not noticed any significant change.  She says she has dementia.  She says she is oriented to self, place and familiar names but not time at baseline.  She says she has a cane and a walker at home but does not use.  Denies smoking cigarettes, drinking alcohol recreational drug use.  Patient's daughter wanted the patient to be full code.   In ED, initial BP 136/110.  BP dropped to 83/62.  She was given 2 L normal saline bolus and blood pressure improved to 90s over 60s.  She also went into A. fib with RVR but converted back to sinus rhythm spontaneously.  CBC remarkable for hemoglobin to 9.9 and platelet of 126 but no leukocytosis.  CMP remarkable for hypokalemia to 3.2, bicarb 18, glucose 244, creatinine 3.72 (no recent value but 1.0 in 2017) BUN 56, anion gap 20, AST 160 and ALT 100.  Initial troponin 0 0.08.  EKG A. fib  with RVR and bifascicular block (old).  Urinalysis with 15 ketones and few bacteria.  Lactic acid 4.3.  Chest x-ray without acute finding.  CT head negative.  Cover test negative.  Received 2 L of normal saline bolus.  Blood cultures drawn.  Started on cefepime, Flagyl and vancomycin and hospitalist service was called for sepsis.  ROS Patient answers no to all the questions but not a reliable historian. PMH Past Medical History:  Diagnosis Date  . Anxiety   . Arthritis    celebrex use off & on for arthritis- hands, back   . Back pain   . Bifascicular block   . Breast cancer ([place])    a. DCIS s/p R lumpectomy  . Hyperlipidemia   . Hypertension   . Palpitations    a. holter (7/11):  isolated PVCs and PACs  . Rheumatism   . Valvular heart disease    a. Echo (7/11):  EF 55-60%, Gr 1 DD, mild-mod AI, mild-mod MR, mod PI, PASP 30;  b. Echo (09/2010):  Mild LVH, EF 60%, Gr 1 DD, mild to mod AI, mild MR, mild TR, mild to mod PI, PASP 36.;   c.  Echo (05/2013):  EF 60-65%, no RWMA, mild AI, mild to mod PI    PSH Past Surgical History:  Procedure Laterality Date  . ABDOMINAL HYSTERECTOMY     partial   .  APPENDECTOMY    . EAR CYST EXCISION  11/07/2011   Procedure: CYST REMOVAL;  Surgeon: Madilyn Hook, DO;  Location: [place];  Service: General;  Laterality: Right;  Removal Cyst Right Upper Chest  . EYE SURGERY     left eye 2013  . JOINT REPLACEMENT     2008- L knee  . KNEE SURGERY     Left  . SHOULDER SURGERY     Left   Fam HX Family History  Problem Relation Age of Onset  . Diabetes Mother   . Other Mother        Bleeding disorder  . Diabetes Father   . Other Father        Bleeding disorder   Social Hx  reports that she has never smoked. She has never used smokeless tobacco. She reports that she does not drink alcohol or use drugs.  Allergy Allergies  Allergen Reactions  . Milk-Related Compounds Other (See Comments)    Frequent bowel movements   . Augmentin [Amoxicillin-Pot  Clavulanate]     diarrhea   Home Meds Prior to Admission medications   Medication Sig Start Date End Date Taking? Authorizing Provider  ALPRAZolam Duanne Moron) 0.25 MG tablet Take 1 tablet by mouth 3 (three) times daily. 02/23/14   [provider]  aspirin 81 MG EC tablet Take 81 mg by mouth daily.      [provider]  celecoxib (CELEBREX) 200 MG capsule Take 200 mg by mouth at bedtime.     [provider]  Cyanocobalamin (VITAMIN B-12 PO) Take 1 tablet by mouth daily.    [provider]  EAR WAX REMOVAL DROPS 6.5 % otic solution Place 5 drops into both ears 2 (two) times daily as needed (for ears).  06/10/13   [provider]  feeding supplement, RESOURCE BREEZE, (RESOURCE BREEZE) LIQD Take 1 Container by mouth 2 (two) times daily between meals. 07/15/13   Hongalgi, Lenis Dickinson, MD  ferrous gluconate (FERGON) 325 MG tablet Take 325 mg by mouth daily with breakfast.    [provider]  LUMIGAN 0.01 % SOLN Place 1 drop into the right eye at bedtime.  07/25/12   [provider]  TRIBENZOR 40-5-25 MG TABS Take 1 tablet by mouth daily. 03/02/14   [provider]    Physical Exam: Vitals:   07/13/18 1500 07/13/18 1515 07/13/18 1530 07/13/18 1545  BP:  98/70 105/66 96/61  Pulse:  87 80 79  Resp:  (!) 21 19 19   Temp:      TempSrc:      SpO2:  100% 100% 100%  Weight:      Height: 4\' 9"  (1.448 m)       Constitutional - resting comfortably, no acute distress Eyes -chronic corneal clouding in left eye.  Right pupil round and reactive to light. Nose - no gross deformity or drainage Mouth - no oral lesions noted Throat - no swelling or erythema Endo - no obvious thyromegaly CV -RRR. (+)S1S2, no murmurs; no JVD or peripheral edema Resp - No increased work of breathing, good aeration bilaterally, no wheeze or crackles GI - (+)BS, soft, non-tender, non-distended MSK - normal range of motion, no obvious deformity Skin - no obvious  rashes or lesions Neuro - alert, aware, oriented only to self and place.  Cranial nerves grossly intact.  Motor 5/5 in all extremities.  Light sensation intact.  Patellar reflex symmetric. Psych - calm  Labs on Admission: I have personally reviewed following  labs and imaging studies  CBC: Recent Labs  Lab 07/13/18 1154  WBC 8.8  NEUTROABS 7.1  HGB 9.9*  HCT 32.1*  MCV 73.0*  PLT 941*   Basic Metabolic Panel: Recent Labs  Lab 07/13/18 1154  NA 139  K 3.2*  CL 101  CO2 18*  GLUCOSE 244*  BUN 56*  CREATININE 3.72*  CALCIUM 9.3   GFR: Estimated Creatinine Clearance: 7.3 mL/min (A) (by C-G formula based on SCr of 3.72 mg/dL (H)). Liver Function Tests: Recent Labs  Lab 07/13/18 1154  AST 160*  ALT 100*  ALKPHOS 71  BILITOT 0.5  PROT 6.6  ALBUMIN 3.2*   No results for input(s): LIPASE, AMYLASE in the last 168 hours. No results for input(s): AMMONIA in the last 168 hours. Coagulation Profile: No results for input(s): INR, PROTIME in the last 168 hours. Cardiac Enzymes: Recent Labs  Lab 07/13/18 1154  TROPONINI 0.08*   BNP (last 3 results) No results for input(s): PROBNP in the last 8760 hours. HbA1C: Recent Labs    07/13/18 1552  HGBA1C 5.9*   CBG: No results for input(s): GLUCAP in the last 168 hours. Lipid Profile: No results for input(s): CHOL, HDL, LDLCALC, TRIG, CHOLHDL, LDLDIRECT in the last 72 hours. Thyroid Function Tests: No results for input(s): TSH, T4TOTAL, FREET4, T3FREE, THYROIDAB in the last 72 hours. Anemia Panel: No results for input(s): VITAMINB12, FOLATE, FERRITIN, TIBC, IRON, RETICCTPCT in the last 72 hours. Urine analysis:    Component Value Date/Time   COLORURINE YELLOW 07/13/2018 1154   APPEARANCEUR CLEAR 07/13/2018 1154   LABSPEC >1.030 (H) 07/13/2018 1154   PHURINE 5.0 07/13/2018 1154   GLUCOSEU NEGATIVE 07/13/2018 1154   HGBUR SMALL (A) 07/13/2018 1154   BILIRUBINUR SMALL (A) 07/13/2018 1154   KETONESUR 15 (A) 07/13/2018  1154   PROTEINUR 100 (A) 07/13/2018 1154   UROBILINOGEN 0.2 03/28/2014 1429   NITRITE NEGATIVE 07/13/2018 1154   LEUKOCYTESUR NEGATIVE 07/13/2018 1154    Sepsis Labs:  Lactic acid 4.3. No Leukocytosis.  Radiological Exams on Admission: Ct Head Wo Contrast  Result Date: 07/13/2018 CLINICAL DATA:  Altered mental status. EXAM: CT HEAD WITHOUT CONTRAST TECHNIQUE: Contiguous axial images were obtained from the base of the skull through the vertex without intravenous contrast. COMPARISON:  03/28/2014 FINDINGS: Brain: Ventricles, cisterns and other CSF spaces are normal. There is no mass, mass effect, shift of midline structures or acute hemorrhage. No evidence of acute infarction. Minimal chronic ischemic microvascular disease. Vascular: No hyperdense vessel or unexpected calcification. Skull: Normal. Negative for fracture or focal lesion. Sinuses/Orbits: No acute finding. Other: None. IMPRESSION: No acute findings. Chronic ischemic microvascular disease. Electronically Signed   By: Marin Olp M.D.   On: 07/13/2018 12:32   Dg Chest Port 1 View  Result Date: 07/13/2018 CLINICAL DATA:  chest pain EXAM: PORTABLE CHEST - 1 VIEW COMPARISON:  07/09/2013 FINDINGS: Minimal linear opacities in the right upper lobe at site of previous cavitary lesion. Left lung clear. Heart size and mediastinal contours are within normal limits. Aortic Atherosclerosis (ICD10-170.0). No effusion. No pneumothorax. Visualized bones unremarkable. IMPRESSION: No acute cardiopulmonary disease. Electronically Signed   By: Lucrezia Europe M.D.   On: 07/13/2018 12:16    All images have been reviewed by me personally.   EKG: A. fib with RVR, RBBB and LAFB (old) without acute ischemic finding.  Assessment/Plan Principal Problem:   Fall at home, initial encounter Active Problems:   HYPERTENSION, BENIGN   Microcytic anemia   Lactic acidosis  AKI (acute kidney injury) ([place])   High anion gap metabolic acidosis   Elevated liver  enzymes   Thrombocytopenia ([facility])   Elevated troponin   Atrial fibrillation with RVR ([place])  Fall at home, initial encounter: Reportedly fell twice this morning.  No loss of consciousness.  CT head without acute finding.  Per daughter, has cane and walker but does not use.  Neuro exam reassuring.  Suspect this to be due to dehydration.  However, she had short-lived A. fib with RVR in ED -Fall precaution -PT/OT  New onset A. fib with RVR: had short-lived RVR in ED that has resolved spontaneously. -Doubt candidacy for anticoagulation given recurrent fall -We will continue monitoring on telemetry -We will get limited echocardiogram.  AKI: unclear if this is a slow progression of acute as we do not have recent labs to compare to.  She is on NSAID and Tribenzor.  Status post 2 L of normal saline in ED. -Avoid nephrotoxic meds. -LR at 75 cc/h -Discontinue vancomycin and flagyl  High anion gap metabolic acidosis/lactic acidosis: I suspect this to be due to dehydration versus infectious process.  Patient with history of dementia and lives alon.  Daughter is worried about her p.o. intake. -We will continue cefepime pending blood cultures. -Discontinue vancomycin and Flagyl. -Trend lactic acid -IV fluid as above  Elevated liver enzyme: Likely due to dehydration as above -Continue trending -We will consider further work-up if no improvement.  Elevated troponin: this is likely due to short-lived RVR and delayed clearance due to AKI.  She denies cardiopulmonary symptoms.  EKG without acute ischemic finding. -Continue trending  Hypertension: Patient was hypotensive to 83/61 in ED but has recovered with bolus fluid. -Hold home BP medications.  Microcytic anemia: No recent value in a chart.  Hemoglobin 11.1 in 2018. -Check anemia panel -H&H  Thrombocytopenia: Chronic.  Better than baseline.  Dementia: Reportedly oriented to self, place and familiar faces but not time. -Delirium  precaution/frequent reorientation  DVT prophylaxis: Subcu heparin  Code Status: Full code per discussion with patient's daughter Family Communication: Updated patient's daughter over the phone  Disposition Plan: Admit to medical telemetry Consults called: None Admission status: Observation status.   Mercy Riding MD [facility]  If 7PM-7AM, please contact night-coverage [email or web address] Password [email or web address]  07/13/2018, 6:00 PM

## 2018-07-13 NOTE — Progress Notes (Signed)
Pharmacy Antibiotic Note  Brianna Hunter is a 85 y.o. female admitted on 07/13/2018 with concern for sepsis of unknown source.  Pharmacy has been consulted for vancomycin/aztreonam dosing. Patient has tolerated cefepime and cefazolin in the past, will transition aztreonam to cefepime per protocol.  Patient is afebrile, LA 4.3, WBC WNL. Scr 3.72, in AKI, last Scr in chart from 2017, 1.0.   Plan: Vancomycin 1000 mg IV x1. Given Scr, will give one time dose and further dosing will be based on levels Cefepime 1 g IV q24h Monitor clinical status, renal function, LOT and cultures  Weight: 116 lb (52.6 kg)  Temp (24hrs), Avg:97.5 F (36.4 C), Min:97.4 F (36.3 C), Max:97.8 F (36.6 C)  Recent Labs  Lab 07/13/18 1154 07/13/18 1404  WBC 8.8  --   CREATININE 3.72*  --   LATICACIDVEN  --  4.3*    CrCl cannot be calculated (Unknown ideal weight.).    Allergies  Allergen Reactions  . Milk-Related Compounds Other (See Comments)    Frequent bowel movements   . Augmentin [Amoxicillin-Pot Clavulanate]     diarrhea    Antimicrobials this admission: Vancomycin 5/4 >> Cefepime 5/4 >> Flagyl 5/4 x1  Dose adjustments this admission:   Microbiology results: 5/4 BCx: 5/4 COVID- in process   Thank you for allowing pharmacy to be a part of this patient's care.  Claiborne Billings, PharmD PGY2 Cardiology Pharmacy Resident Please check AMION for all Pharmacist numbers by unit 07/13/2018 3:22 PM

## 2018-07-13 NOTE — ED Triage Notes (Signed)
ptr in by ambulance with vague s/sx.  Reportedly fell x 2 this AM, doesn't remember precipitating events prior.

## 2018-07-13 NOTE — ED Provider Notes (Signed)
[facility] EMERGENCY DEPARTMENT Provider Note   CSN: [id number] Arrival date & time: 07/13/18  1121    History   Chief Complaint No chief complaint on file. Falls  HPI Brianna Hunter is a 85 y.o. female with a PMH of anxiety, HTN, HLD, Breast Cancer in 2018, and valvular heart disease presenting after 2 falls this morning. Patient arrived via EMS. Patient states she fell on her back. Patient is unable to describe the event or provide a coherent history. Patient initially denied any complaints, but then reports intermittent central non radiating chest pain. Patient is unable to state when chest pain began. Patient denies shortness of breath, nausea, or vomiting. Patient reports generalized weakness. Patient denies vision changes, numbness, or paresthesias. Patient is a poor historian due to her altered mental status.   Level 5 caveat.      HPI  Past Medical History:  Diagnosis Date   Anxiety    Arthritis    celebrex use off & on for arthritis- hands, back    Back pain    Bifascicular block    Breast cancer ([place])    a. DCIS s/p R lumpectomy   Hyperlipidemia    Hypertension    Palpitations    a. holter (7/11):  isolated PVCs and PACs   Rheumatism    Valvular heart disease    a. Echo (7/11):  EF 55-60%, Gr 1 DD, mild-mod AI, mild-mod MR, mod PI, PASP 30;  b. Echo (09/2010):  Mild LVH, EF 60%, Gr 1 DD, mild to mod AI, mild MR, mild TR, mild to mod PI, PASP 36.;   c.  Echo (05/2013):  EF 60-65%, no RWMA, mild AI, mild to mod PI     Patient Active Problem List   Diagnosis Date Noted   Microcytic anemia 10/15/2013   Poor dentition 08/09/2013   Acute respiratory failure with hypoxia ([place]) 07/12/2013   CAP (community acquired pneumonia) 07/11/2013   Sepsis ([place]) 07/09/2013   Cavitary lesion of lung 07/09/2013   Leukocytosis 07/09/2013   Protein-calorie malnutrition, moderate ([place]) 07/09/2013   Hemoptysis 07/09/2013   Primary cancer of  lower-inner quadrant of right female breast ([place]) 10/07/2011   Valvular heart disease 08/22/2010   HYPERLIPIDEMIA-MIXED 09/06/2009   AORTIC & MITRAL STENOSIS W/ INSUFFI, RHEUM/NON-RHEUM 09/06/2009   HYPERTENSION, BENIGN 09/06/2009   AORTIC STENOSIS/ INSUFFICIENCY, NON-RHEUMATIC 09/06/2009   PALPITATIONS 09/06/2009   ABDOMINAL MASS 09/06/2009    Past Surgical History:  Procedure Laterality Date   ABDOMINAL HYSTERECTOMY     partial    APPENDECTOMY     EAR CYST EXCISION  11/07/2011   Procedure: CYST REMOVAL;  Surgeon: Madilyn Hook, DO;  Location: [facility];  Service: General;  Laterality: Right;  Removal Cyst Right Upper Chest   EYE SURGERY     left eye 2013   JOINT REPLACEMENT     2008- L knee   KNEE SURGERY     Left   SHOULDER SURGERY     Left     OB History   No obstetric history on file.      Home Medications    Prior to Admission medications   Medication Sig Start Date End Date Taking? Authorizing Provider  ALPRAZolam Duanne Moron) 0.25 MG tablet Take 1 tablet by mouth 3 (three) times daily. 02/23/14   [provider]  aspirin 81 MG EC tablet Take 81 mg by mouth daily.      [provider]  celecoxib (CELEBREX) 200 MG capsule Take 200  mg by mouth at bedtime.     [provider]  Cyanocobalamin (VITAMIN B-12 PO) Take 1 tablet by mouth daily.    [provider]  EAR WAX REMOVAL DROPS 6.5 % otic solution Place 5 drops into both ears 2 (two) times daily as needed (for ears).  06/10/13   [provider]  feeding supplement, RESOURCE BREEZE, (RESOURCE BREEZE) LIQD Take 1 Container by mouth 2 (two) times daily between meals. 07/15/13   Hongalgi, Lenis Dickinson, MD  ferrous gluconate (FERGON) 325 MG tablet Take 325 mg by mouth daily with breakfast.    [provider]  LUMIGAN 0.01 % SOLN Place 1 drop into the right eye at bedtime.  07/25/12   [provider]  TRIBENZOR 40-5-25 MG TABS Take 1 tablet by mouth daily. 03/02/14    [provider]    Family History Family History  Problem Relation Age of Onset   Diabetes Mother    Other Mother        Bleeding disorder   Diabetes Father    Other Father        Bleeding disorder    Social History Social History   Tobacco Use   Smoking status: Never Smoker   Smokeless tobacco: Never Used  Substance Use Topics   Alcohol use: No   Drug use: No     Allergies   Milk-related compounds and Augmentin [amoxicillin-pot clavulanate]   Review of Systems Review of Systems  Unable to perform ROS: Mental status change     Physical Exam Updated Vital Signs BP 97/69    Pulse 87    Temp (!) 97.4 F (36.3 C) (Rectal)    Resp 19    Ht 4\' 9"  (1.448 m)    Wt 52.6 kg    SpO2 100%    BMI 25.10 kg/m   Physical Exam Vitals signs and nursing note reviewed.  Constitutional:      General: She is not in acute distress.    Appearance: She is well-developed. She is not diaphoretic.  HENT:     Head: Normocephalic and atraumatic.     Right Ear: Tympanic membrane, ear canal and external ear normal.     Left Ear: Tympanic membrane, ear canal and external ear normal.     Nose: Nose normal.     Mouth/Throat:     Mouth: Mucous membranes are dry.     Pharynx: No posterior oropharyngeal erythema.  Eyes:     Extraocular Movements: Extraocular movements intact.     Conjunctiva/sclera: Conjunctivae normal.     Pupils: Pupils are equal, round, and reactive to light.     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   Neck:     Musculoskeletal: Normal range of motion.  Cardiovascular:     Rate and Rhythm: Tachycardia present. Rhythm irregular.     Heart sounds: Normal heart sounds. No murmur. No friction rub. No gallop.   Pulmonary:     Effort: Pulmonary effort is normal. No respiratory distress.     Breath sounds: Normal breath sounds. No wheezing or rales.  Abdominal:     Palpations: Abdomen is soft.     Tenderness: There is no abdominal  tenderness.  Musculoskeletal: Normal range of motion.  Skin:    General: Skin is warm.     Findings: No erythema or rash.  Neurological:     Mental Status: She is alert.    Mental Status:  Alert and oriented to  person and place. Not oriented to date or president. Patient is not able to give a coherent history. Speech fluent without evidence of aphasia. Patient is not able to follow some 2 step commands. Cranial Nerves:  II:  Peripheral visual fields grossly normal (expect on left eye, left eye is blind), right pupil equal, round, reactive to light III,IV, VI: ptosis not present, extra-ocular motions intact bilaterally  V,VII: smile symmetric, facial light touch sensation equal VIII: hearing grossly normal to voice  IX,X: symmetric elevation of soft palate, uvula elevates symmetrically  XI: bilateral shoulder shrug symmetric and strong XII: midline tongue extension without fassiculations Motor:  Normal tone. 5/5 in upper and lower extremities bilaterally including strong and equal grip strength and dorsiflexion/plantar flexion Sensory: light touch normal in all extremities.  Deep Tendon Reflexes: 2+ and symmetric in the biceps and patella Cerebellar: unable to assess due to lack of cooperation Gait: unable to ambulate due to weakness.  CV: distal pulses palpable throughout   ED Treatments / Results  Labs (all labs ordered are listed, but only abnormal results are displayed) Labs Reviewed  COMPREHENSIVE METABOLIC PANEL - Abnormal; Notable for the following components:      Result Value   Potassium 3.2 (*)    CO2 18 (*)    Glucose, Bld 244 (*)    BUN 56 (*)    Creatinine, Ser 3.72 (*)    Albumin 3.2 (*)    AST 160 (*)    ALT 100 (*)    GFR calc non Af Amer 10 (*)    GFR calc Af Amer 12 (*)    Anion gap 20 (*)    All other components within normal limits  CBC WITH DIFFERENTIAL/PLATELET - Abnormal; Notable for the following components:   Hemoglobin 9.9 (*)    HCT 32.1 (*)      MCV 73.0 (*)    MCH 22.5 (*)    Platelets 125 (*)    All other components within normal limits  URINALYSIS, ROUTINE W REFLEX MICROSCOPIC - Abnormal; Notable for the following components:   Specific Gravity, Urine >1.030 (*)    Hgb urine dipstick SMALL (*)    Bilirubin Urine SMALL (*)    Ketones, ur 15 (*)    Protein, ur 100 (*)    All other components within normal limits  TROPONIN I - Abnormal; Notable for the following components:   Troponin I 0.08 (*)    All other components within normal limits  LACTIC ACID, PLASMA - Abnormal; Notable for the following components:   Lactic Acid, Venous 4.3 (*)    All other components within normal limits  URINALYSIS, MICROSCOPIC (REFLEX) - Abnormal; Notable for the following components:   Bacteria, UA FEW (*)    All other components within normal limits  SARS CORONAVIRUS 2 (HOSPITAL ORDER, [facility])  CULTURE, BLOOD (ROUTINE X 2)  CULTURE, BLOOD (ROUTINE X 2)  URINE CULTURE  LACTIC ACID, PLASMA  HEMOGLOBIN A1C    EKG EKG Interpretation  Date/Time:  Monday Jul 13 2018 12:02:36 EDT Ventricular Rate:  136 PR Interval:    QRS Duration: 148 QT Interval:  322 QTC Calculation: 485 R Axis:   -79 Text Interpretation:  Atrial fibrillation RBBB and LAFB LVH with secondary repolarization abnormality Otherwise no significant change Confirmed by Deno Etienne [id number]) on 07/13/2018 12:39:22 PM   Radiology Ct Head Wo Contrast  Result Date: 07/13/2018 CLINICAL DATA:  Altered mental status. EXAM: CT HEAD WITHOUT CONTRAST TECHNIQUE:  Contiguous axial images were obtained from the base of the skull through the vertex without intravenous contrast. COMPARISON:  03/28/2014 FINDINGS: Brain: Ventricles, cisterns and other CSF spaces are normal. There is no mass, mass effect, shift of midline structures or acute hemorrhage. No evidence of acute infarction. Minimal chronic ischemic microvascular disease. Vascular: No hyperdense vessel or  unexpected calcification. Skull: Normal. Negative for fracture or focal lesion. Sinuses/Orbits: No acute finding. Other: None. IMPRESSION: No acute findings. Chronic ischemic microvascular disease. Electronically Signed   By: Marin Olp M.D.   On: 07/13/2018 12:32   Dg Chest Port 1 View  Result Date: 07/13/2018 CLINICAL DATA:  chest pain EXAM: PORTABLE CHEST - 1 VIEW COMPARISON:  07/09/2013 FINDINGS: Minimal linear opacities in the right upper lobe at site of previous cavitary lesion. Left lung clear. Heart size and mediastinal contours are within normal limits. Aortic Atherosclerosis (ICD10-170.0). No effusion. No pneumothorax. Visualized bones unremarkable. IMPRESSION: No acute cardiopulmonary disease. Electronically Signed   By: Lucrezia Europe M.D.   On: 07/13/2018 12:16    Procedures Procedures (including critical care time)  Medications Ordered in ED Medications  potassium chloride 10 mEq in 100 mL IVPB (10 mEq Intravenous New Bag/Given 07/13/18 1536)  metroNIDAZOLE (FLAGYL) IVPB 500 mg (has no administration in time range)  vancomycin (VANCOCIN) IVPB 1000 mg/200 mL premix (1,000 mg Intravenous New Bag/Given 07/13/18 1550)  ceFEPIme (MAXIPIME) 1 g in sodium chloride 0.9 % 100 mL IVPB (has no administration in time range)  sodium chloride 0.9 % bolus 1,000 mL (0 mLs Intravenous Stopped 07/13/18 1548)  sodium chloride 0.9 % bolus 1,000 mL (1,000 mLs Intravenous New Bag/Given 07/13/18 1511)     Initial Impression / Assessment and Plan / ED Course  I have reviewed the triage vital signs and the nursing notes.  Pertinent labs & imaging results that were available during my care of the patient were reviewed by me and considered in my medical decision making (see chart for details).  Clinical Course as of Jul 13 1607  Mon Jul 13, 2018  1232 No acute cardiopulmonary disease noted on CXR.  DG Chest Port 1 View [AH]  1238 No acute findings noted on head CT. Chronic ischemic microvascular disease.      CT Head Wo Contrast [AH]  1305 Troponin elevated at 0.08. No previous values for comparison.  Troponin I - Once(!!) [AH]  1307 Creatinine elevated at 3.72. This is significantly elevated compared to previous value.   Creatinine(!): 3.72 [AH]  1335 AST elevated at 160 and ALT elevated at 100.  AST(!): 160 [AH]  1510 UA reveals few bacteria, small amount of Hgb, bilirubin, protein, and ketones.   Bacteria, UA(!): FEW [AH]  1534 Lactic acid elevated at 4.3.  Lactic acid, plasma(!!) [AH]  1607 Called and update daughter on results and plan. Daughter states she understands and agrees with plan.   [AH]    Clinical Course User Index [AH] Arville Lime, [place]      Patient presents after multiple falls this morning. EKG reveals atrial fibrillation with RVR. Patient does not have a prior history of atrial fibrillation. It is uncertain how long the chest pain was present. Troponin is elevated at 0.08. Patient states chest pain has completely resolved on its own. Creatinine elevated at 3.72. Provided IVF and antibiotics while in the ER. Lactic acid was 4.3. Concern for possible sepsis. Patient will need admission for possible sepsis and AKI. Suspect AKI is likely due to dehydration. Consulted hospitalist for admission.  Hospitalist has agreed to admit patient.  Findings and plan of care discussed with supervising physician Dr. Tyrone Nine who personally evaluated and examined this patient.  Final Clinical Impressions(s) / ED Diagnoses   Final diagnoses:  Fall, initial encounter  Elevated troponin  Acute kidney injury ([place])  Hypotension, unspecified hypotension type    ED Discharge Orders    None       Arville Lime, PA-C 07/13/18 [place], Train[place], DO 07/14/18 [phone]

## 2018-07-13 NOTE — ED Notes (Signed)
Nurse Navigator called pt daughter May and updated her about current tests that are back with permission from pt.  Pt and her daughter were able to FaceTime.

## 2018-07-13 NOTE — ED Notes (Signed)
ED TO INPATIENT HANDOFF REPORT  ED Nurse Name and Phone #: Celene Squibb RN  S Name/Age/Gender Brianna Hunter 85 y.o. female Room/Bed: 020C/020C  Code Status   Code Status: Full Code  Home/SNF/Other Home Patient oriented to: self, place, time and situation Is this baseline? Yes   Triage Complete: Triage complete  Chief Complaint Frequent Falls; Hypotensive  Triage Note ptr in by ambulance with vague s/sx.  Reportedly fell x 2 this AM, doesn't remember precipitating events prior.     Allergies Allergies  Allergen Reactions  . Milk-Related Compounds Other (See Comments)    Frequent bowel movements   . Augmentin [Amoxicillin-Pot Clavulanate]     diarrhea    Level of Care/Admitting Diagnosis ED Disposition    ED Disposition Condition [facility]: [place] [[id number]]  Level of Care: Telemetry Medical [104]  I expect the patient will be discharged within 24 hours: No (not a candidate for 5C-Observation unit)  Covid Evaluation: N/A  Diagnosis: Fall at home, initial encounter [phone]  Admitting Physician: Mercy Riding [[id number]]  Attending Physician: Mercy Riding [[id number]]  PT Class (Do Not Modify): Observation [104]  PT Acc Code (Do Not Modify): Observation [10022]       B Medical/Surgery History Past Medical History:  Diagnosis Date  . Anxiety   . Arthritis    celebrex use off & on for arthritis- hands, back   . Back pain   . Bifascicular block   . Breast cancer ([place])    a. DCIS s/p R lumpectomy  . Hyperlipidemia   . Hypertension   . Palpitations    a. holter (7/11):  isolated PVCs and PACs  . Rheumatism   . Valvular heart disease    a. Echo (7/11):  EF 55-60%, Gr 1 DD, mild-mod AI, mild-mod MR, mod PI, PASP 30;  b. Echo (09/2010):  Mild LVH, EF 60%, Gr 1 DD, mild to mod AI, mild MR, mild TR, mild to mod PI, PASP 36.;   c.  Echo (05/2013):  EF 60-65%, no RWMA, mild AI, mild to mod PI    Past Surgical History:   Procedure Laterality Date  . ABDOMINAL HYSTERECTOMY     partial   . APPENDECTOMY    . EAR CYST EXCISION  11/07/2011   Procedure: CYST REMOVAL;  Surgeon: Madilyn Hook, DO;  Location: [facility];  Service: General;  Laterality: Right;  Removal Cyst Right Upper Chest  . EYE SURGERY     left eye 2013  . JOINT REPLACEMENT     2008- L knee  . KNEE SURGERY     Left  . SHOULDER SURGERY     Left     A IV Location/Drains/Wounds Patient Lines/Drains/Airways Status   Active Line/Drains/Airways    Name:   Placement date:   Placement time:   Site:   Days:   Peripheral IV 07/13/18 Left Antecubital   07/13/18    1143    Antecubital   less than 1   Peripheral IV 07/13/18 Right Hand   07/13/18    1549    Hand   less than 1   Incision 11/07/11 Breast Right   11/07/11    1350     2440   Incision 11/07/11 Chest Right   11/07/11    1419     2440   Wound / Incision (Open or Dehisced) 03/28/14 Laceration  Left   03/28/14    1535    -  1568          Intake/Output Last 24 hours  Intake/Output Summary (Last 24 hours) at 07/13/2018 1737 Last data filed at 07/13/2018 1737 Gross per 24 hour  Intake 3000 ml  Output -  Net 3000 ml    Labs/Imaging Results for orders placed or performed during the hospital encounter of 07/13/18 (from the past 48 hour(s))  Comprehensive metabolic panel     Status: Abnormal   Collection Time: 07/13/18 11:54 AM  Result Value Ref Range   Sodium 139 135 - 145 mmol/L   Potassium 3.2 (L) 3.5 - 5.1 mmol/L   Chloride 101 98 - 111 mmol/L   CO2 18 (L) 22 - 32 mmol/L   Glucose, Bld 244 (H) 70 - 99 mg/dL   BUN 56 (H) 8 - 23 mg/dL   Creatinine, Ser 3.72 (H) 0.44 - 1.00 mg/dL   Calcium 9.3 8.9 - 10.3 mg/dL   Total Protein 6.6 6.5 - 8.1 g/dL   Albumin 3.2 (L) 3.5 - 5.0 g/dL   AST 160 (H) 15 - 41 U/L   ALT 100 (H) 0 - 44 U/L   Alkaline Phosphatase 71 38 - 126 U/L   Total Bilirubin 0.5 0.3 - 1.2 mg/dL   GFR calc non Af Amer 10 (L) >60 mL/min   GFR calc Af Amer 12 (L) >60 mL/min    Anion gap 20 (H) 5 - 15    Comment: Performed at [facility], [address] [place], [place] [address]  CBC with Differential     Status: Abnormal   Collection Time: 07/13/18 11:54 AM  Result Value Ref Range   WBC 8.8 4.0 - 10.5 K/uL   RBC 4.40 3.87 - 5.11 MIL/uL   Hemoglobin 9.9 (L) 12.0 - 15.0 g/dL   HCT 32.1 (L) 36.0 - 46.0 %   MCV 73.0 (L) 80.0 - 100.0 fL   MCH 22.5 (L) 26.0 - 34.0 pg   MCHC 30.8 30.0 - 36.0 g/dL   RDW 15.0 11.5 - 15.5 %   Platelets 125 (L) 150 - 400 K/uL    Comment: REPEATED TO VERIFY   nRBC 0.0 0.0 - 0.2 %   Neutrophils Relative % 80 %   Neutro Abs 7.1 1.7 - 7.7 K/uL   Lymphocytes Relative 13 %   Lymphs Abs 1.2 0.7 - 4.0 K/uL   Monocytes Relative 5 %   Monocytes Absolute 0.4 0.1 - 1.0 K/uL   Eosinophils Relative 1 %   Eosinophils Absolute 0.1 0.0 - 0.5 K/uL   Basophils Relative 0 %   Basophils Absolute 0.0 0.0 - 0.1 K/uL   Immature Granulocytes 1 %   Abs Immature Granulocytes 0.04 0.00 - 0.07 K/uL    Comment: Performed at [facility], [place] [address], [place], [place] [address]  Urinalysis, Routine w reflex microscopic     Status: Abnormal   Collection Time: 07/13/18 11:54 AM  Result Value Ref Range   Color, Urine YELLOW YELLOW   APPearance CLEAR CLEAR   Specific Gravity, Urine >1.030 (H) 1.005 - 1.030   pH 5.0 5.0 - 8.0   Glucose, UA NEGATIVE NEGATIVE mg/dL   Hgb urine dipstick SMALL (A) NEGATIVE   Bilirubin Urine SMALL (A) NEGATIVE   Ketones, ur 15 (A) NEGATIVE mg/dL   Protein, ur 100 (A) NEGATIVE mg/dL   Nitrite NEGATIVE NEGATIVE   Leukocytes,Ua NEGATIVE NEGATIVE    Comment: Performed at [facility] [address] [place], [place] [address]  Troponin I - Once     Status: Abnormal   Collection Time: 07/13/18 11:54 AM  Result Value Ref Range   Troponin I 0.08 (HH) <0.03 ng/mL    Comment: CRITICAL RESULT CALLED TO, READ BACK BY AND VERIFIED WITH: Kingsley Plan RN [phone] [id number] BY  A BENNETT Performed at [facility], [place] [address], [place], [place] [address]   Urinalysis, Microscopic (reflex)     Status: Abnormal   Collection Time: 07/13/18 11:54 AM  Result Value Ref Range   RBC / HPF 6-10 0 - 5 RBC/hpf   WBC, UA 0-5 0 - 5 WBC/hpf   Bacteria, UA FEW (A) NONE SEEN   Squamous Epithelial / LPF 6-10 0 - 5   Mucus PRESENT    Hyaline Casts, UA PRESENT     Comment: Performed at [facility], [place] [address] [place], [place] [address]  Lactic acid, plasma     Status: Abnormal   Collection Time: 07/13/18  2:04 PM  Result Value Ref Range   Lactic Acid, Venous 4.3 (HH) 0.5 - 1.9 mmol/L    Comment: CRITICAL RESULT CALLED TO, READ BACK BY AND VERIFIED WITH: Jerilynn Mages Harel Repetto RN [phone] BY A BENNETT Performed at [facility], [place] [address], [place], [place] [address]   SARS Coronavirus 2 [facility] order, Performed in [facility] hospital lab)     Status: None   Collection Time: 07/13/18  2:19 PM  Result Value Ref Range   SARS Coronavirus 2 NEGATIVE NEGATIVE    Comment: (NOTE) If result is NEGATIVE SARS-CoV-2 target nucleic acids are NOT DETECTED. The SARS-CoV-2 RNA is generally detectable in upper and lower  respiratory specimens during the acute phase of infection. The lowest  concentration of SARS-CoV-2 viral copies this assay can detect is 250  copies / mL. A negative result does not preclude SARS-CoV-2 infection  and should not be used as the sole basis for treatment or other  patient management decisions.  A negative result may occur with  improper specimen collection / handling, submission of specimen other  than nasopharyngeal swab, presence of viral mutation(s) within the  areas targeted by this assay, and inadequate number of viral copies  (<250 copies / mL). A negative result must be combined with clinical  observations, patient history, and epidemiological information. If result is POSITIVE SARS-CoV-2 target nucleic acids are DETECTED. The SARS-CoV-2 RNA is generally detectable in  upper and lower  respiratory specimens dur ing the acute phase of infection.  Positive  results are indicative of active infection with SARS-CoV-2.  Clinical  correlation with patient history and other diagnostic information is  necessary to determine patient infection status.  Positive results do  not rule out bacterial infection or co-infection with other viruses. If result is PRESUMPTIVE POSTIVE SARS-CoV-2 nucleic acids MAY BE PRESENT.   A presumptive positive result was obtained on the submitted specimen  and confirmed on repeat testing.  While 2019 novel coronavirus  (SARS-CoV-2) nucleic acids may be present in the submitted sample  additional confirmatory testing may be necessary for epidemiological  and / or clinical management purposes  to differentiate between  SARS-CoV-2 and other Sarbecovirus currently known to infect humans.  If clinically indicated additional testing with an alternate test  methodology [phone]) is advised. The SARS-CoV-2 RNA is generally  detectable in upper and lower respiratory sp ecimens during the acute  phase of infection. The expected result is Negative. Fact Sheet for Patients:  StrictlyIdeas.no  Fact Sheet for Healthcare Providers: BankingDealers.co.za This test is not yet approved or cleared by the [place] FDA and has been authorized for detection and/or diagnosis of SARS-CoV-2 by FDA under an Emergency Use Authorization (EUA).  This EUA will remain in effect (meaning this test can be used) for the duration of the COVID-19 declaration under Section 564(b)(1) of the Act, 21 U.S.C. section 360bbb-3(b)(1), unless the authorization is terminated or revoked sooner. Performed at [facility], [place] [address], [place], [place] [address]   Hemoglobin A1c     Status: Abnormal   Collection Time: 07/13/18  3:52 PM  Result Value Ref Range   Hgb A1c MFr Bld 5.9 (H) 4.8 - 5.6 %    Comment:  (NOTE) Pre diabetes:          5.7%-6.4% Diabetes:              >6.4% Glycemic control for   <7.0% adults with diabetes    Mean Plasma Glucose 122.63 mg/dL    Comment: Performed at [facility] [address] [place], [place] [address]   Ct Head Wo Contrast  Result Date: 07/13/2018 CLINICAL DATA:  Altered mental status. EXAM: CT HEAD WITHOUT CONTRAST TECHNIQUE: Contiguous axial images were obtained from the base of the skull through the vertex without intravenous contrast. COMPARISON:  03/28/2014 FINDINGS: Brain: Ventricles, cisterns and other CSF spaces are normal. There is no mass, mass effect, shift of midline structures or acute hemorrhage. No evidence of acute infarction. Minimal chronic ischemic microvascular disease. Vascular: No hyperdense vessel or unexpected calcification. Skull: Normal. Negative for fracture or focal lesion. Sinuses/Orbits: No acute finding. Other: None. IMPRESSION: No acute findings. Chronic ischemic microvascular disease. Electronically Signed   By: Marin Olp M.D.   On: 07/13/2018 12:32   Dg Chest Port 1 View  Result Date: 07/13/2018 CLINICAL DATA:  chest pain EXAM: PORTABLE CHEST - 1 VIEW COMPARISON:  07/09/2013 FINDINGS: Minimal linear opacities in the right upper lobe at site of previous cavitary lesion. Left lung clear. Heart size and mediastinal contours are within normal limits. Aortic Atherosclerosis (ICD10-170.0). No effusion. No pneumothorax. Visualized bones unremarkable. IMPRESSION: No acute cardiopulmonary disease. Electronically Signed   By: Lucrezia Europe M.D.   On: 07/13/2018 12:16    Pending Labs Unresulted Labs (From admission, onward)    Start     Ordered   07/14/18 6599  Basic metabolic panel  Daily,   R     07/13/18 1527   07/14/18 0500  CBC  Tomorrow morning,   R     07/13/18 1642   07/14/18 0500  Comprehensive metabolic panel  Tomorrow morning,   R     07/13/18 1642   07/13/18 1642  Hemoglobin A1c  Once,   R     07/13/18 1642    07/13/18 1637  CBC  (heparin)  Once,   R    Comments:  Baseline for heparin therapy IF NOT ALREADY DRAWN.  Notify MD if PLT < 100 K.    07/13/18 1642   07/13/18 1637  Creatinine, serum  (heparin)  Once,   R    Comments:  Baseline for heparin therapy IF NOT ALREADY DRAWN.    07/13/18 1642   07/13/18 1545  Urine Culture  Add-on,   STAT     07/13/18 1544   07/13/18 1244  Blood culture (routine x 2)  BLOOD CULTURE X 2,   STAT     07/13/18 1243   07/13/18  1244  Lactic acid, plasma  Now then every 2 hours,   STAT     07/13/18 1243          Vitals/Pain Today's Vitals   07/13/18 1500 07/13/18 1515 07/13/18 1530 07/13/18 1545  BP:  98/70 105/66 96/61  Pulse:  87 80 79  Resp:  (!) 21 19 19   Temp:      TempSrc:      SpO2:  100% 100% 100%  Weight:      Height: 4\' 9"  (1.448 m)       Isolation Precautions No active isolations  Medications Medications  metroNIDAZOLE (FLAGYL) IVPB 500 mg (has no administration in time range)  ceFEPIme (MAXIPIME) 1 g in sodium chloride 0.9 % 100 mL IVPB (has no administration in time range)  feeding supplement (BOOST / RESOURCE BREEZE) liquid 1 Container (has no administration in time range)  ALPRAZolam (XANAX) tablet 0.25 mg (has no administration in time range)  heparin injection 5,000 Units (has no administration in time range)  acetaminophen (TYLENOL) tablet 650 mg (has no administration in time range)    Or  acetaminophen (TYLENOL) suppository 650 mg (has no administration in time range)  traZODone (DESYREL) tablet 25 mg (has no administration in time range)  senna (SENOKOT) tablet 8.6 mg (has no administration in time range)  polyethylene glycol (MIRALAX / GLYCOLAX) packet 17 g (has no administration in time range)  sodium chloride 0.9 % bolus 1,000 mL (0 mLs Intravenous Stopped 07/13/18 1548)  potassium chloride 10 mEq in 100 mL IVPB (10 mEq Intravenous New Bag/Given 07/13/18 1536)  sodium chloride 0.9 % bolus 1,000 mL (0 mLs Intravenous Stopped  07/13/18 1705)  vancomycin (VANCOCIN) IVPB 1000 mg/200 mL premix (0 mg Intravenous Stopped 07/13/18 1737)    Mobility walks High fall risk   Focused Assessments Cardiac Assessment Handoff:    Lab Results  Component Value Date   TROPONINI 0.08 ([place]) 07/13/2018   No results found for: DDIMER Does the Patient currently have chest pain? No     R Recommendations: See Admitting Provider Note  Report given to:   Additional Notes:

## 2018-07-14 ENCOUNTER — Observation Stay (HOSPITAL_COMMUNITY): Payer: Medicare HMO

## 2018-07-14 DIAGNOSIS — W19XXXA Unspecified fall, initial encounter: Secondary | ICD-10-CM | POA: Diagnosis not present

## 2018-07-14 DIAGNOSIS — E872 Acidosis: Secondary | ICD-10-CM | POA: Diagnosis present

## 2018-07-14 DIAGNOSIS — W1830XA Fall on same level, unspecified, initial encounter: Secondary | ICD-10-CM | POA: Diagnosis present

## 2018-07-14 DIAGNOSIS — Z7982 Long term (current) use of aspirin: Secondary | ICD-10-CM | POA: Diagnosis not present

## 2018-07-14 DIAGNOSIS — F419 Anxiety disorder, unspecified: Secondary | ICD-10-CM | POA: Diagnosis present

## 2018-07-14 DIAGNOSIS — Z833 Family history of diabetes mellitus: Secondary | ICD-10-CM | POA: Diagnosis not present

## 2018-07-14 DIAGNOSIS — I480 Paroxysmal atrial fibrillation: Secondary | ICD-10-CM | POA: Diagnosis present

## 2018-07-14 DIAGNOSIS — R531 Weakness: Secondary | ICD-10-CM | POA: Diagnosis not present

## 2018-07-14 DIAGNOSIS — N179 Acute kidney failure, unspecified: Secondary | ICD-10-CM | POA: Diagnosis not present

## 2018-07-14 DIAGNOSIS — Z515 Encounter for palliative care: Secondary | ICD-10-CM | POA: Diagnosis not present

## 2018-07-14 DIAGNOSIS — E876 Hypokalemia: Secondary | ICD-10-CM | POA: Diagnosis present

## 2018-07-14 DIAGNOSIS — R748 Abnormal levels of other serum enzymes: Secondary | ICD-10-CM | POA: Diagnosis present

## 2018-07-14 DIAGNOSIS — D509 Iron deficiency anemia, unspecified: Secondary | ICD-10-CM | POA: Diagnosis present

## 2018-07-14 DIAGNOSIS — I452 Bifascicular block: Secondary | ICD-10-CM | POA: Diagnosis present

## 2018-07-14 DIAGNOSIS — N170 Acute kidney failure with tubular necrosis: Secondary | ICD-10-CM | POA: Diagnosis present

## 2018-07-14 DIAGNOSIS — E43 Unspecified severe protein-calorie malnutrition: Secondary | ICD-10-CM | POA: Diagnosis present

## 2018-07-14 DIAGNOSIS — I10 Essential (primary) hypertension: Secondary | ICD-10-CM | POA: Diagnosis present

## 2018-07-14 DIAGNOSIS — Z832 Family history of diseases of the blood and blood-forming organs and certain disorders involving the immune mechanism: Secondary | ICD-10-CM | POA: Diagnosis not present

## 2018-07-14 DIAGNOSIS — D696 Thrombocytopenia, unspecified: Secondary | ICD-10-CM | POA: Diagnosis present

## 2018-07-14 DIAGNOSIS — Z1159 Encounter for screening for other viral diseases: Secondary | ICD-10-CM | POA: Diagnosis not present

## 2018-07-14 DIAGNOSIS — I38 Endocarditis, valve unspecified: Secondary | ICD-10-CM | POA: Diagnosis present

## 2018-07-14 DIAGNOSIS — Z79899 Other long term (current) drug therapy: Secondary | ICD-10-CM | POA: Diagnosis not present

## 2018-07-14 DIAGNOSIS — Z91011 Allergy to milk products: Secondary | ICD-10-CM | POA: Diagnosis not present

## 2018-07-14 DIAGNOSIS — F0390 Unspecified dementia without behavioral disturbance: Secondary | ICD-10-CM | POA: Diagnosis present

## 2018-07-14 DIAGNOSIS — I4891 Unspecified atrial fibrillation: Secondary | ICD-10-CM | POA: Diagnosis not present

## 2018-07-14 DIAGNOSIS — Z853 Personal history of malignant neoplasm of breast: Secondary | ICD-10-CM | POA: Diagnosis not present

## 2018-07-14 DIAGNOSIS — R7989 Other specified abnormal findings of blood chemistry: Secondary | ICD-10-CM | POA: Diagnosis present

## 2018-07-14 DIAGNOSIS — I361 Nonrheumatic tricuspid (valve) insufficiency: Secondary | ICD-10-CM

## 2018-07-14 DIAGNOSIS — E785 Hyperlipidemia, unspecified: Secondary | ICD-10-CM | POA: Diagnosis present

## 2018-07-14 DIAGNOSIS — I313 Pericardial effusion (noninflammatory): Secondary | ICD-10-CM | POA: Diagnosis present

## 2018-07-14 DIAGNOSIS — Z881 Allergy status to other antibiotic agents status: Secondary | ICD-10-CM | POA: Diagnosis not present

## 2018-07-14 DIAGNOSIS — Y92009 Unspecified place in unspecified non-institutional (private) residence as the place of occurrence of the external cause: Secondary | ICD-10-CM | POA: Diagnosis not present

## 2018-07-14 LAB — CBC
HCT: 28 % — ABNORMAL LOW (ref 36.0–46.0)
Hemoglobin: 9.1 g/dL — ABNORMAL LOW (ref 12.0–15.0)
MCH: 22.6 pg — ABNORMAL LOW (ref 26.0–34.0)
MCHC: 32.5 g/dL (ref 30.0–36.0)
MCV: 69.7 fL — ABNORMAL LOW (ref 80.0–100.0)
Platelets: 103 10*3/uL — ABNORMAL LOW (ref 150–400)
RBC: 4.02 MIL/uL (ref 3.87–5.11)
RDW: 14 % (ref 11.5–15.5)
WBC: 8.4 10*3/uL (ref 4.0–10.5)
nRBC: 0 % (ref 0.0–0.2)

## 2018-07-14 LAB — CK TOTAL AND CKMB (NOT AT ARMC)
CK, MB: 6.2 ng/mL — ABNORMAL HIGH (ref 0.5–5.0)
Relative Index: 2.4 (ref 0.0–2.5)
Total CK: 257 U/L — ABNORMAL HIGH (ref 38–234)

## 2018-07-14 LAB — COMPREHENSIVE METABOLIC PANEL
ALT: 63 U/L — ABNORMAL HIGH (ref 0–44)
AST: 61 U/L — ABNORMAL HIGH (ref 15–41)
Albumin: 2.9 g/dL — ABNORMAL LOW (ref 3.5–5.0)
Alkaline Phosphatase: 64 U/L (ref 38–126)
Anion gap: 14 (ref 5–15)
BUN: 61 mg/dL — ABNORMAL HIGH (ref 8–23)
CO2: 21 mmol/L — ABNORMAL LOW (ref 22–32)
Calcium: 9.2 mg/dL (ref 8.9–10.3)
Chloride: 106 mmol/L (ref 98–111)
Creatinine, Ser: 2.53 mg/dL — ABNORMAL HIGH (ref 0.44–1.00)
GFR calc Af Amer: 19 mL/min — ABNORMAL LOW (ref >60)
GFR calc non Af Amer: 16 mL/min — ABNORMAL LOW (ref >60)
Glucose, Bld: 92 mg/dL (ref 70–99)
Potassium: 3.8 mmol/L (ref 3.5–5.1)
Sodium: 141 mmol/L (ref 135–145)
Total Bilirubin: 0.7 mg/dL (ref 0.3–1.2)
Total Protein: 6.1 g/dL — ABNORMAL LOW (ref 6.5–8.1)

## 2018-07-14 LAB — URINE CULTURE: Culture: NO GROWTH

## 2018-07-14 LAB — TROPONIN I: Troponin I: 0.88 ng/mL (ref <0.03)

## 2018-07-14 LAB — ECHOCARDIOGRAM LIMITED
Height: 57 in
Weight: 1760 oz

## 2018-07-14 LAB — LACTIC ACID, PLASMA: Lactic Acid, Venous: 1.3 mmol/L (ref 0.5–1.9)

## 2018-07-14 MED ORDER — SODIUM CHLORIDE 0.9 % IV SOLN
INTRAVENOUS | Status: DC
  Administered 2018-07-14 – 2018-07-16 (×4): via INTRAVENOUS

## 2018-07-14 NOTE — Progress Notes (Signed)
PROGRESS NOTE    Brianna Hunter  LNL:[id number] DOB: December 22, 1928 DOA: 07/13/2018 PCP: [name], MD    Brief Narrative:  Brianna Hunter is a 85 y.o. female with history of left breast DCIS status post lumpectomy treated with tamoxifen, hypertension, hyperlipidemia, dementia, anxiety, valvular heart disease and bifascicular block presenting with fall at home. Patient is unreliable historian due to dementia. In ED, initial BP 136/110.  BP dropped to 83/62.  She was given 2 L normal saline bolus and blood pressure improved to 90s over 60s.  She also went into A. fib with RVR but converted back to sinus rhythm spontaneously.  CBC remarkable for hemoglobin to 9.9 and platelet of 126 but no leukocytosis.  CMP remarkable for hypokalemia to 3.2, bicarb 18, glucose 244, creatinine 3.72 (no recent value but 1.0 in 2017) BUN 56, anion gap 20, AST 160 and ALT 100.  Initial troponin 0 0.08.  EKG A. fib with RVR and bifascicular block (old).  Urinalysis with 15 ketones and few bacteria.  Lactic acid 4.3.  Chest x-ray without acute finding.  CT head negative.  Cover test negative. Received 2 L of normal saline bolus.  Blood cultures drawn.  Started on cefepime, Flagyl and vancomycin and hospitalist service was called for sepsis.     Assessment & Plan:   Principal Problem:   Fall at home, initial encounter Active Problems:   HYPERTENSION, BENIGN   Microcytic anemia   Lactic acidosis   AKI (acute kidney injury) ([place])   High anion gap metabolic acidosis   Elevated liver enzymes   Thrombocytopenia ([facility])   Elevated troponin   Atrial fibrillation with RVR ([place])   Acute kidney injury (AKI) with acute tubular necrosis (ATN) ([place])  Fall at home, initial encounter: Mechanical fall.  Skeletal survey negative for any acute fractures.  She does have balance issues.  Continue to work with PT OT.  Anticipate patient will benefit with inpatient therapies at SNF or rehab.  New onset A. fib with RVR: Had  transient RVR with A. fib that resolved spontaneously and currently sinus rhythm.  Patient is not anticoagulation candidate due to advanced dementia and frequent falling.  Currently sinus rhythm.  Acute kidney injury: Known baseline creatinine of 1, presented with a creatinine of 3.  Treated with IV fluids and improving to 2.53 today.  CK levels fairly stable.  We will continue IV fluids.  Monitor intake and output.  Lactic acidosis with high anion gap metabolic acidosis: Due to dehydration.  Unlikely infection.  Lactate has normalized.  Patient was given broad-spectrum antibiotics with vancomycin, cefepime and Flagyl in the ER.  No evidence of infection.  Will discontinue all antibiotics and monitor.  Elevated liver enzyme: Probably due to hypotension.  Stabilizing.  Will recheck in the morning.  Elevated troponins: Probably due to acute kidney injury or with RVR.  Type II non-STEMI.  No ischemic change in EKG.  Remains flat.  ACS ruled out.  Echocardiogram is ordered and pending.  Do not anticipate any treatment for elevated troponin.  History of hypertension: Hypotensive in the ER.  Holding all home BP medications.  Chronic microcytic anemia: Fairly stable.  Patient has acute kidney injury, lactic acidosis and elevated troponin.  She has very high risk of decompensation and deterioration if not treated in the hospital.  Patient is anticipated to stay in the hospital for more than 2 midnights.  She will be treated as inpatient.  DVT prophylaxis: Heparin subcu Code Status: Full code Family Communication: Daughter  May updated over the phone. She is thinking of arranging home health aid at home.  Disposition Plan: SNF   Consultants:   None  Procedures:   None  Antimicrobials:   Vancomycin, cefepime and Flagyl 1 dose in the ER.   Subjective: Patient was seen and examined at the bedside.  She worked with physical therapy.  She had no overnight events.  When she was working with  physical therapy, on standing upright her blood pressure dropped as low as 80 systolic but patient was not feeling any dizziness. Patient is with advanced dementia.  She herself denied any complaints.  Objective: Vitals:   07/13/18 1838 07/14/18 0043 07/14/18 0406 07/14/18 1138  BP: 131/87 107/67 117/73 118/62  Pulse: 87 66 64 70  Resp: 20 18 18 17   Temp: 97.6 F (36.4 C) 97.8 F (36.6 C) 97.9 F (36.6 C) (!) 97.3 F (36.3 C)  TempSrc: Oral Oral Oral Oral  SpO2: 93% 100% 100% 100%  Weight:   49.9 kg   Height:        Intake/Output Summary (Last 24 hours) at 07/14/2018 1156 Last data filed at 07/14/2018 1147 Gross per 24 hour  Intake 3100 ml  Output 400 ml  Net 2700 ml   Filed Weights   07/13/18 1121 07/13/18 1135 07/14/18 0406  Weight: 52.6 kg 52.6 kg 49.9 kg    Examination:  General exam: Appears calm and comfortable, on room air. Respiratory system: Clear to auscultation. Respiratory effort normal. Cardiovascular system: S1 & S2 heard, RRR. No JVD, murmurs, rubs, gallops or clicks. No pedal edema. Gastrointestinal system: Abdomen is nondistended, soft and nontender. No organomegaly or masses felt. Normal bowel sounds heard. Central nervous system: Alert and awake.  She is oriented to herself only.  No focal neurological deficits. Extremities: Symmetric 5 x 5 power. Skin: No rashes, lesions or ulcers Psychiatry: Judgement and insight appear normal. Mood & affect appropriate.     Data Reviewed: I have personally reviewed following labs and imaging studies  CBC: Recent Labs  Lab 07/13/18 1154 07/13/18 1821 07/14/18 0322  WBC 8.8  --  8.4  NEUTROABS 7.1  --   --   HGB 9.9* 10.5* 9.1*  HCT 32.1* 31.0* 28.0*  MCV 73.0*  --  69.7*  PLT 125*  --  878*   Basic Metabolic Panel: Recent Labs  Lab 07/13/18 1154 07/13/18 1821 07/14/18 0322  NA 139 139 141  K 3.2* 3.6 3.8  CL 101  --  106  CO2 18*  --  21*  GLUCOSE 244*  --  92  BUN 56*  --  61*  CREATININE 3.72*   --  2.53*  CALCIUM 9.3  --  9.2   GFR: Estimated Creatinine Clearance: 10.5 mL/min (A) (by C-G formula based on SCr of 2.53 mg/dL (H)). Liver Function Tests: Recent Labs  Lab 07/13/18 1154 07/14/18 0322  AST 160* 61*  ALT 100* 63*  ALKPHOS 71 64  BILITOT 0.5 0.7  PROT 6.6 6.1*  ALBUMIN 3.2* 2.9*   No results for input(s): LIPASE, AMYLASE in the last 168 hours. No results for input(s): AMMONIA in the last 168 hours. Coagulation Profile: No results for input(s): INR, PROTIME in the last 168 hours. Cardiac Enzymes: Recent Labs  Lab 07/13/18 1154 07/13/18 2101 07/14/18 0322 07/14/18 0744  CKTOTAL  --   --   --  257*  CKMB  --   --   --  6.2*  TROPONINI 0.08* 0.93* 0.88*  --  BNP (last 3 results) No results for input(s): PROBNP in the last 8760 hours. HbA1C: Recent Labs    07/13/18 1552  HGBA1C 5.9*   CBG: No results for input(s): GLUCAP in the last 168 hours. Lipid Profile: No results for input(s): CHOL, HDL, LDLCALC, TRIG, CHOLHDL, LDLDIRECT in the last 72 hours. Thyroid Function Tests: No results for input(s): TSH, T4TOTAL, FREET4, T3FREE, THYROIDAB in the last 72 hours. Anemia Panel: No results for input(s): VITAMINB12, FOLATE, FERRITIN, TIBC, IRON, RETICCTPCT in the last 72 hours. Sepsis Labs: Recent Labs  Lab 07/13/18 1404 07/13/18 1817 07/14/18 0744  LATICACIDVEN 4.3* 2.2* 1.3    Recent Results (from the past 240 hour(s))  Blood culture (routine x 2)     Status: None (Preliminary result)   Collection Time: 07/13/18  1:58 PM  Result Value Ref Range Status   Specimen Description BLOOD RIGHT ANTECUBITAL  Final   Special Requests   Final    BOTTLES DRAWN AEROBIC AND ANAEROBIC Blood Culture adequate volume   Culture   Final    NO GROWTH < 24 HOURS Performed at [facility], [place] [address], [place], [place] [address]    Report Status PENDING  Incomplete  Blood culture (routine x 2)     Status: None (Preliminary result)   Collection Time:  07/13/18  2:01 PM  Result Value Ref Range Status   Specimen Description BLOOD RIGHT HAND  Final   Special Requests   Final    BOTTLES DRAWN AEROBIC AND ANAEROBIC Blood Culture results may not be optimal due to an inadequate volume of blood received in culture bottles   Culture   Final    NO GROWTH < 24 HOURS Performed at [facility], [place] [address], [place] [address], [place] [address]    Report Status PENDING  Incomplete  SARS Coronavirus 2 [facility] order, Performed in [place] hospital lab)     Status: None   Collection Time: 07/13/18  2:19 PM  Result Value Ref Range Status   SARS Coronavirus 2 NEGATIVE NEGATIVE Final    Comment: (NOTE) If result is NEGATIVE SARS-CoV-2 target nucleic acids are NOT DETECTED. The SARS-CoV-2 RNA is generally detectable in upper and lower  respiratory specimens during the acute phase of infection. The lowest  concentration of SARS-CoV-2 viral copies this assay can detect is 250  copies / mL. A negative result does not preclude SARS-CoV-2 infection  and should not be used as the sole basis for treatment or other  patient management decisions.  A negative result may occur with  improper specimen collection / handling, submission of specimen other  than nasopharyngeal swab, presence of viral mutation(s) within the  areas targeted by this assay, and inadequate number of viral copies  (<250 copies / mL). A negative result must be combined with clinical  observations, patient history, and epidemiological information. If result is POSITIVE SARS-CoV-2 target nucleic acids are DETECTED. The SARS-CoV-2 RNA is generally detectable in upper and lower  respiratory specimens dur ing the acute phase of infection.  Positive  results are indicative of active infection with SARS-CoV-2.  Clinical  correlation with patient history and other diagnostic information is  necessary to determine patient infection status.  Positive results do  not rule out bacterial  infection or co-infection with other viruses. If result is PRESUMPTIVE POSTIVE SARS-CoV-2 nucleic acids MAY BE PRESENT.   A presumptive positive result was obtained on the submitted specimen  and confirmed on repeat testing.  While 2019 novel  coronavirus  (SARS-CoV-2) nucleic acids may be present in the submitted sample  additional confirmatory testing may be necessary for epidemiological  and / or clinical management purposes  to differentiate between  SARS-CoV-2 and other Sarbecovirus currently known to infect humans.  If clinically indicated additional testing with an alternate test  methodology [phone]) is advised. The SARS-CoV-2 RNA is generally  detectable in upper and lower respiratory sp ecimens during the acute  phase of infection. The expected result is Negative. Fact Sheet for Patients:  StrictlyIdeas.no Fact Sheet for Healthcare Providers: BankingDealers.co.za This test is not yet approved or cleared by the [place] FDA and has been authorized for detection and/or diagnosis of SARS-CoV-2 by FDA under an Emergency Use Authorization (EUA).  This EUA will remain in effect (meaning this test can be used) for the duration of the COVID-19 declaration under Section 564(b)(1) of the Act, 21 U.S.C. section 360bbb-3(b)(1), unless the authorization is terminated or revoked sooner. Performed at [facility], [place] [address] [place], [place] [address]          Radiology Studies: Ct Head Wo Contrast  Result Date: 07/13/2018 CLINICAL DATA:  Altered mental status. EXAM: CT HEAD WITHOUT CONTRAST TECHNIQUE: Contiguous axial images were obtained from the base of the skull through the vertex without intravenous contrast. COMPARISON:  03/28/2014 FINDINGS: Brain: Ventricles, cisterns and other CSF spaces are normal. There is no mass, mass effect, shift of midline structures or acute hemorrhage. No evidence of acute infarction.  Minimal chronic ischemic microvascular disease. Vascular: No hyperdense vessel or unexpected calcification. Skull: Normal. Negative for fracture or focal lesion. Sinuses/Orbits: No acute finding. Other: None. IMPRESSION: No acute findings. Chronic ischemic microvascular disease. Electronically Signed   By: Marin Olp M.D.   On: 07/13/2018 12:32   Dg Chest Port 1 View  Result Date: 07/13/2018 CLINICAL DATA:  chest pain EXAM: PORTABLE CHEST - 1 VIEW COMPARISON:  07/09/2013 FINDINGS: Minimal linear opacities in the right upper lobe at site of previous cavitary lesion. Left lung clear. Heart size and mediastinal contours are within normal limits. Aortic Atherosclerosis (ICD10-170.0). No effusion. No pneumothorax. Visualized bones unremarkable. IMPRESSION: No acute cardiopulmonary disease. Electronically Signed   By: Lucrezia Europe M.D.   On: 07/13/2018 12:16        Scheduled Meds: . feeding supplement  1 Container Oral BID BM  . heparin  5,000 Units Subcutaneous Q8H  . senna  1 tablet Oral BID   Continuous Infusions: . sodium chloride 250 mL (07/13/18 2259)  . sodium chloride       LOS: 0 days    Time spent: 25 minutes.    Barb Merino, MD [facility] Pager [phone]  If 7PM-7AM, please contact night-coverage [email or web address] Password [email or web address] 07/14/2018, 11:56 AM

## 2018-07-14 NOTE — Evaluation (Signed)
Occupational Therapy Evaluation Patient Details Name: Brianna Hunter MRN: [id number] DOB: [birth year] Today's Date: 07/14/2018    History of Present Illness 85 yo admitted after 2 falls at home. Afib with RVR resolved in ED, dehydration. PMhx: breast CA, anxiety, arthritis, HLD, HTN, Lt TKA, dementia   Clinical Impression   Pt presents to OT pleasantly confused, not aware that she is in hospital "Oh, I gotta get to the doctor!" Pt with dinner tray in front of her and only eaten a little. Assisted with a few more bites of pears due to baseline visual deficits "I don't eat much anymore". Pt was min A for transfers and hand held assist, overall min A to max A for ADL. At this time patient presents as a VERY high fall risk, does not have 24 hour support (good supportive daughter, but she is employed) with require skilled OT at the acute level and afterwards at the SNF level to maximize safety and independence in ADL and functional transfers.    Follow Up Recommendations  SNF;Supervision/Assistance - 24 hour    Equipment Recommendations  Other (comment)(defer to next venue)    Recommendations for Other Services       Precautions / Restrictions Precautions Precautions: Fall Precaution Comments: blind left eye Restrictions Weight Bearing Restrictions: No      Mobility Bed Mobility               General bed mobility comments: OOB at the beginning and end of session  Transfers Overall transfer level: Needs assistance   Transfers: Sit to/from Stand Sit to Stand: Min assist         General transfer comment: min A for boost, hand over hand assist for hand placement    Balance Overall balance assessment: Needs assistance   Sitting balance-Leahy Scale: Fair     Standing balance support: Bilateral upper extremity supported Standing balance-Leahy Scale: Poor Standing balance comment: hand held assist for gait, she reaches for support but does not want to use RW                            ADL either performed or assessed with clinical judgement   ADL Overall ADL's : Needs assistance/impaired Eating/Feeding: Maximal assistance;Sitting Eating/Feeding Details (indicate cue type and reason): assist for baseline visual deficits and cues for attention to task Grooming: Wash/dry hands;Minimal assistance;Cueing for sequencing;Standing   Upper Body Bathing: Minimal assistance;Sitting   Lower Body Bathing: Moderate assistance   Upper Body Dressing : Minimal assistance;Sitting   Lower Body Dressing: Moderate assistance;Sit to/from stand   Toilet Transfer: Minimal assistance;Ambulation(HHA)   Toileting- Clothing Manipulation and Hygiene: Sit to/from stand;Minimal assistance       Functional mobility during ADLs: Minimal assistance(hand held assist) General ADL Comments: cognition impacts ability to perform ADL, decreased activity tolerance, decreased balance     Vision Baseline Vision/History: Wears glasses(blind in L eye) Wears Glasses: At all times Patient Visual Report: No change from baseline       Perception     Praxis      Pertinent Vitals/Pain Pain Assessment: No/denies pain     Hand Dominance Right   Extremity/Trunk Assessment Upper Extremity Assessment Upper Extremity Assessment: Generalized weakness   Lower Extremity Assessment Lower Extremity Assessment: Defer to PT evaluation   Cervical / Trunk Assessment Cervical / Trunk Assessment: Kyphotic   Communication Communication Communication: HOH   Cognition Arousal/Alertness: Awake/alert Behavior During Therapy: WFL for tasks assessed/performed Overall Cognitive Status:  Impaired/Different from baseline Area of Impairment: Orientation;Attention;Memory;Problem solving;Following commands;Safety/judgement                 Orientation Level: Disoriented to;Time;Situation;Place Current Attention Level: Sustained Memory: Decreased short-term memory Following  Commands: Follows one step commands consistently;Follows one step commands with increased time Safety/Judgement: Decreased awareness of safety;Decreased awareness of deficits   Problem Solving: Slow processing;Decreased initiation;Requires verbal cues;Requires tactile cues General Comments: Pt is unaware that she is at the hospital, no IV awareness, attempting to get out of chair upon OT entry. She is confused throughout session, easily redirected and pleasant   General Comments       Exercises     Shoulder Instructions      Home Living Family/patient expects to be discharged to:: Private residence Living Arrangements: Alone Available Help at Discharge: Family;Available PRN/intermittently Type of Home: Apartment Home Access: Level entry     Home Layout: One level     Bathroom Shower/Tub: Teacher, early years/pre: Standard     Home Equipment: Cane - single point;Walker - 2 wheels          Prior Functioning/Environment Level of Independence: Independent        Comments: Pt unreliable historian, very HOH        OT Problem List: Decreased activity tolerance;Impaired balance (sitting and/or standing);Decreased cognition;Decreased safety awareness;Decreased knowledge of use of DME or AE      OT Treatment/Interventions: Self-care/ADL training;DME and/or AE instruction;Therapeutic activities;Cognitive remediation/compensation;Patient/family education;Balance training    OT Goals(Current goals can be found in the care plan section) Acute Rehab OT Goals Patient Stated Goal: return home OT Goal Formulation: With patient Time For Goal Achievement: 07/28/18 Potential to Achieve Goals: Fair ADL Goals Pt Will Perform Grooming: with modified independence;standing Pt Will Perform Upper Body Dressing: with supervision;sitting Pt Will Perform Lower Body Dressing: with supervision;sit to/from stand Pt Will Transfer to Toilet: ambulating;with supervision Pt Will Perform  Toileting - Clothing Manipulation and hygiene: with supervision;sit to/from stand  OT Frequency: Min 2X/week   Barriers to D/C: Decreased caregiver support  Pt lives alone.        Co-evaluation              AM-PAC OT "6 Clicks" Daily Activity     Outcome Measure Help from another person eating meals?: A Lot Help from another person taking care of personal grooming?: A Lot Help from another person toileting, which includes using toliet, bedpan, or urinal?: A Lot Help from another person bathing (including washing, rinsing, drying)?: A Lot Help from another person to put on and taking off regular upper body clothing?: A Lot Help from another person to put on and taking off regular lower body clothing?: A Lot 6 Click Score: 12   End of Session Equipment Utilized During Treatment: Gait belt Nurse Communication: Mobility status  Activity Tolerance: Patient tolerated treatment well Patient left: in chair;with call bell/phone within reach;with chair alarm set;Other (comment)(posey belt applied and on)  OT Visit Diagnosis: Unsteadiness on feet (R26.81);Other abnormalities of gait and mobility (R26.89);Repeated falls (R29.6);History of falling (Z91.81);Muscle weakness (generalized) (M62.81);Adult, failure to thrive (R62.7);Other symptoms and signs involving cognitive function                Time: 0630-1601 OT Time Calculation (min): 19 min Charges:  OT General Charges $OT Visit: 1 Visit OT Evaluation $OT Eval Moderate Complexity: Protivin OTR/L Acute Rehabilitation Services Pager: [phone] Office: [place] 07/14/2018, 4:04 PM

## 2018-07-14 NOTE — TOC Initial Note (Signed)
Transition of Care [facility]) - Initial/Assessment Note    Patient Details  Name: Brianna Hunter MRN: [id number] Date of Birth: 1928-03-18  Transition of [facility]) CM/SW Contact:    Candie Chroman, LCSW Phone Number: 07/14/2018, 2:46 PM  Clinical Narrative: Patient not fully oriented. CSW called daughter, introduced role, and explained that PT recommendations would be discussed. Daughter does not want SNF placement but is agreeable to home health. She was active with [facility] but wants to look into other agency options. She is currently working and asked that CSW call tomorrow during her lunch hour to discuss plan. No further concerns. CSW encouraged patient's daughter to contact CSW as needed. CSW will continue to follow patient and her daughter for support and facilitate discharge home once medically stable.  Expected Discharge Plan: [place] Barriers to Discharge: Continued Medical Work up   Patient Goals and CMS Choice Patient states their goals for this hospitalization and ongoing recovery are:: Patient not fully oriented.      Expected Discharge Plan and Services Expected Discharge Plan: [place] arrangements for the past 2 months: Apartment                                      Prior Living Arrangements/Services Living arrangements for the past 2 months: Apartment Lives with:: Self Patient language and need for interpreter reviewed:: No Do you feel safe going back to the place where you live?: Yes      Need for Family Participation in Patient Care: Yes (Comment) Care giver support system in place?: No (comment)(Will discuss home health services with daughter tomorrow.)   Criminal Activity/Legal Involvement Pertinent to Current Situation/Hospitalization: No - Comment as needed  Activities of Daily Living      Permission Sought/Granted Permission sought to share information with : Family Supports Permission  granted to share information with : (Patient not fully oriented)  Share Information with NAME: May Gunnarson     Permission granted to share info w Relationship: Daughter  Permission granted to share info w Contact Information: [phone]  Emotional Assessment Appearance:: Appears stated age Attitude/Demeanor/Rapport: Unable to Assess Affect (typically observed): Unable to Assess Orientation: : Oriented to Self, Oriented to Place Alcohol / Substance Use: Never Used Psych Involvement: No (comment)  Admission diagnosis:  Elevated troponin [R79.89] Acute kidney injury ([place]) [N17.9] Fall, initial encounter [W19.XXXA] Hypotension, unspecified hypotension type [I95.9] Patient Active Problem List   Diagnosis Date Noted  . Acute kidney injury (AKI) with acute tubular necrosis (ATN) ([place]) 07/14/2018  . Fall at home, initial encounter 07/13/2018  . Lactic acidosis 07/13/2018  . AKI (acute kidney injury) ([place]) 07/13/2018  . High anion gap metabolic acidosis 54/27/0623  . Elevated liver enzymes 07/13/2018  . Thrombocytopenia ([place]) 07/13/2018  . Elevated troponin 07/13/2018  . Atrial fibrillation with RVR ([place]) 07/13/2018  . Microcytic anemia 10/15/2013  . Poor dentition 08/09/2013  . Acute respiratory failure with hypoxia ([place]) 07/12/2013  . CAP (community acquired pneumonia) 07/11/2013  . Sepsis ([place]) 07/09/2013  . Cavitary lesion of lung 07/09/2013  . Leukocytosis 07/09/2013  . Protein-calorie malnutrition, moderate ([place]) 07/09/2013  . Hemoptysis 07/09/2013  . 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 ([place]) 10/07/2011  . Valvular heart disease 08/22/2010  . HYPERLIPIDEMIA-MIXED 09/06/2009  . AORTIC & MITRAL STENOSIS W/ INSUFFI, RHEUM/NON-RHEUM 09/06/2009  . HYPERTENSION,  BENIGN 09/06/2009  . AORTIC STENOSIS/ INSUFFICIENCY, NON-RHEUMATIC 09/06/2009  . PALPITATIONS 09/06/2009  . ABDOMINAL MASS 09/06/2009   PCP:  [name], MD Pharmacy:   [facility] [place], [address] [place] [address] Phone: [phone] Fax: [phone]     Social Determinants of Health (SDOH) Interventions    Readmission Risk Interventions No flowsheet data found.

## 2018-07-14 NOTE — Progress Notes (Addendum)
Patient has jumped up 2 x today after waking up confused.   Patient was on a low bed with mats in place and all 4 rails up once- climbed over railing .  Bed alarm was turned off for procedure and not turn back on once completed.   The second time patient was sitting in the chair with the safety belt in place mat below, and leg up.   Patient managed to slide under safety belt (still connected), got out of chair and was found walking around room.     Called MD to get order for a tele sitter (video monitor).  MD gave verbal order for sitter.

## 2018-07-14 NOTE — Evaluation (Signed)
Physical Therapy Evaluation Patient Details Name: Brianna Hunter MRN: [id number] DOB: 04/16/1928 Today's Date: 07/14/2018   History of Present Illness  85 yo admitted after 2 falls at home. Afib with RVR resolved in ED, dehydration. PMhx: breast CA, anxiety, arthritis, HLD, HTN, Lt TKA, dementia  Clinical Impression  Pt pleasantly confused aware she fell and that she had not been eating at home but not consistently aware that she is in the hospital. Complicated by pt being [facility] and when asked if she could feel her feet she replied "my toes are missing?". Pt with impaired balance, cognition, safety, functional mobility and gait. She self reports not eating or caring for herself at home without available 24hr assist and repeated falls. Pt is not safe to return home and would benefit from SNF placement as well as continued acute therapy to maximize mobility, gait, balance and safety to decrease fall risk and burden of care.   Supine BP 103/58 (73), HR 74 Initial urgent sit and pivot to BSC with drop in BP to 65/31 (42), HR 107 Sitting 3 min 125/66 (83), HR 82 Standing 110/61 (76), HR 87    Follow Up Recommendations SNF;Supervision/Assistance - 24 hour    Equipment Recommendations  3in1 (PT)    Recommendations for Other Services OT consult     Precautions / Restrictions Precautions Precautions: Fall Precaution Comments: blind left eye      Mobility  Bed Mobility Overal bed mobility: Needs Assistance Bed Mobility: Supine to Sit     Supine to sit: Min guard     General bed mobility comments: HOb flat with increased time and use of rail for pt to rise to sitting  Transfers Overall transfer level: Needs assistance   Transfers: Sit to/from Stand;Stand Pivot Transfers Sit to Stand: Min assist Stand pivot transfers: Min assist       General transfer comment: min assist to rise from bed with pivot from bed to [facility] with hand held assist. Cues for hand placement.    Ambulation/Gait Ambulation/Gait assistance: Min assist Gait Distance (Feet): 68 Feet Assistive device: Rolling walker (2 wheeled) Gait Pattern/deviations: Step-through pattern;Narrow base of support;Scissoring;Decreased stride length;Trunk flexed   Gait velocity interpretation: 1.31 - 2.62 ft/sec, indicative of limited community ambulator General Gait Details: pt with min assist to control RW and balance to direct gait, pt with narrow BOS and scissoring at times. Pt needing directional cues to return the short distance to room  Stairs            Wheelchair Mobility    Modified Rankin (Stroke Patients Only)       Balance Overall balance assessment: Needs assistance   Sitting balance-Leahy Scale: Fair Sitting balance - Comments: able to sit at [facility] and EOB without physical assist   Standing balance support: Bilateral upper extremity supported Standing balance-Leahy Scale: Poor Standing balance comment: single and bil UE support in standing with bil UE support for gait                             Pertinent Vitals/Pain Pain Assessment: No/denies pain    Home Living Family/patient expects to be discharged to:: Private residence Living Arrangements: Alone Available Help at Discharge: Family;Available PRN/intermittently Type of Home: Apartment Home Access: Level entry     Home Layout: One level Home Equipment: Cane - single point;Walker - 2 wheels      Prior Function Level of Independence: Independent  Comments: pt reports she doesn't use cane or walker at home, daughter fixes food and leaves it for her, does her own laundry. PT reports she goes to a lunch room at times. unclear accuracy     Hand Dominance        Extremity/Trunk Assessment   Upper Extremity Assessment Upper Extremity Assessment: Generalized weakness    Lower Extremity Assessment Lower Extremity Assessment: Generalized weakness    Cervical / Trunk  Assessment Cervical / Trunk Assessment: Kyphotic  Communication   Communication: HOH  Cognition Arousal/Alertness: Awake/alert Behavior During Therapy: WFL for tasks assessed/performed Overall Cognitive Status: Impaired/Different from baseline Area of Impairment: Orientation;Attention;Memory;Problem solving;Following commands;Safety/judgement                 Orientation Level: Disoriented to;Time;Situation;Place Current Attention Level: Sustained Memory: Decreased short-term memory Following Commands: Follows one step commands consistently;Follows one step commands with increased time Safety/Judgement: Decreased awareness of safety;Decreased awareness of deficits   Problem Solving: Slow processing;Decreased initiation;Requires verbal cues General Comments: pt stating it is "69", she wants to leave so she can make it to a doctor's appointment. Pt lacks awareness of her function or safety and requires sequential cues for all tasks      General Comments      Exercises     Assessment/Plan    PT Assessment Patient needs continued PT services  PT Problem List Decreased strength;Decreased balance;Decreased cognition;Decreased activity tolerance;Decreased mobility;Decreased knowledge of use of DME;Decreased safety awareness;Decreased knowledge of precautions       PT Treatment Interventions DME instruction;Functional mobility training;Balance training;Patient/family education;Gait training;Therapeutic activities;Neuromuscular re-education;Therapeutic exercise;Cognitive remediation    PT Goals (Current goals can be found in the Care Plan section)  Acute Rehab PT Goals Patient Stated Goal: return home PT Goal Formulation: With patient Time For Goal Achievement: 07/28/18 Potential to Achieve Goals: Fair    Frequency Min 3X/week   Barriers to discharge Decreased caregiver support      Co-evaluation               AM-PAC PT "6 Clicks" Mobility  Outcome Measure Help  needed turning from your back to your side while in a flat bed without using bedrails?: A Little Help needed moving from lying on your back to sitting on the side of a flat bed without using bedrails?: A Little Help needed moving to and from a bed to a chair (including a wheelchair)?: A Little Help needed standing up from a chair using your arms (e.g., wheelchair or bedside chair)?: A Little Help needed to walk in hospital room?: A Little Help needed climbing 3-5 steps with a railing? : A Lot 6 Click Score: 17    End of Session Equipment Utilized During Treatment: Gait belt Activity Tolerance: Patient tolerated treatment well Patient left: in chair;with call bell/phone within reach;with chair alarm set;with restraints reapplied(chair alarm belt with pt educated for use , mitten reapplied) Nurse Communication: Mobility status PT Visit Diagnosis: Other abnormalities of gait and mobility (R26.89);Muscle weakness (generalized) (M62.81);History of falling (Z91.81);Repeated falls (R29.6);Difficulty in walking, not elsewhere classified (R26.2);Unsteadiness on feet (R26.81)    Time: 0177-9390 PT Time Calculation (min) (ACUTE ONLY): 42 min   Charges:   PT Evaluation $PT Eval Moderate Complexity: 1 Mod PT Treatments $Gait Training: 8-22 mins $Therapeutic Activity: 8-22 mins        Brelan Hannen Pam Drown, PT Acute Rehabilitation Services Pager: [phone] Office: [place] 07/14/2018, 11:46 AM

## 2018-07-14 NOTE — Progress Notes (Signed)
  Echocardiogram 2D Echocardiogram has been performed.  Brianna Hunter 07/14/2018, 12:39 PM

## 2018-07-14 NOTE — Progress Notes (Signed)
CRITICAL VALUE ALERT  Critical Value: troponin 0.93  Date & Time Notied: 2300 07/13/18  Provider Notified: yes   Orders Received/Actions taken: No new orders at this time.

## 2018-07-15 LAB — BASIC METABOLIC PANEL
Anion gap: 10 (ref 5–15)
BUN: 37 mg/dL — ABNORMAL HIGH (ref 8–23)
CO2: 24 mmol/L (ref 22–32)
Calcium: 9.3 mg/dL (ref 8.9–10.3)
Chloride: 107 mmol/L (ref 98–111)
Creatinine, Ser: 1.53 mg/dL — ABNORMAL HIGH (ref 0.44–1.00)
GFR calc Af Amer: 35 mL/min — ABNORMAL LOW (ref >60)
GFR calc non Af Amer: 30 mL/min — ABNORMAL LOW (ref >60)
Glucose, Bld: 80 mg/dL (ref 70–99)
Potassium: 3.4 mmol/L — ABNORMAL LOW (ref 3.5–5.1)
Sodium: 141 mmol/L (ref 135–145)

## 2018-07-15 MED ORDER — ADULT MULTIVITAMIN W/MINERALS CH
1.0000 | ORAL_TABLET | Freq: Every day | ORAL | Status: DC
  Administered 2018-07-15 – 2018-07-20 (×6): 1 via ORAL
  Filled 2018-07-15 (×6): qty 1

## 2018-07-15 MED ORDER — BISACODYL 10 MG RE SUPP
10.0000 mg | Freq: Once | RECTAL | Status: DC
  Filled 2018-07-15: qty 1

## 2018-07-15 MED ORDER — PRO-STAT SUGAR FREE PO LIQD
30.0000 mL | Freq: Two times a day (BID) | ORAL | Status: DC
  Administered 2018-07-15 – 2018-07-20 (×10): 30 mL via ORAL
  Filled 2018-07-15 (×10): qty 30

## 2018-07-15 NOTE — Progress Notes (Signed)
Physical Therapy Treatment Patient Details Name: Brianna Hunter MRN: [id number] DOB: October 20, 1928 Today's Date: 07/15/2018    History of Present Illness 85 yo admitted 07/13/18 after 2 falls at home. Afib with RVR resolved in ED, dehydration. PMH includes breast CA, anxiety, arthritis, HLD, HTN, Lt TKA, dementia.   PT Comments    Pt seen with potential for discharge today; daughter currently declining SNF but agreeable to [facility] services. Pt lethargic, not following commands with minimal participation. Per nursing, has not receiving any sedating medications but had a very active night attempting to get up multiple times. Grimacing with bilateral knee pain with passive mobility. Pt will require 24/7 support if to return home safely due to cognitive impairments and high risk for falls. May benefit from eventual LTC/ALF.     Follow Up Recommendations  SNF;Supervision/Assistance - 24 hour     Equipment Recommendations  3in1 (PT)    Recommendations for Other Services       Precautions / Restrictions Precautions Precautions: Fall Precaution Comments: blind left eye Restrictions Weight Bearing Restrictions: No    Mobility  Bed Mobility               General bed mobility comments: Received sitting in recliner  Transfers                    Ambulation/Gait                 Stairs             Wheelchair Mobility    Modified Rankin (Stroke Patients Only)       Balance Overall balance assessment: Needs assistance   Sitting balance-Leahy Scale: Fair Sitting balance - Comments: Able to sit at edge of recliner, UE support on arm rest                                    Cognition Arousal/Alertness: Lethargic Behavior During Therapy: Flat affect Overall Cognitive Status: History of cognitive impairments - at baseline                                 General Comments: Pt opening eyes to max cues; not answering any questions  except "yes" to asked if knee painful. not following commands. Per nursing, was up and active all night so likely tired (has not received any sedating meds)      Exercises Other Exercises Other Exercises: Not performing ROM to command. Tolerated bilateral knee passive extension, but grimacing with movement and knee/lower leg palpation     General Comments        Pertinent Vitals/Pain Pain Assessment: Faces Faces Pain Scale: Hurts little more Pain Location: Bilateral knees Pain Descriptors / Indicators: Grimacing;Moaning Pain Intervention(s): Limited activity within patient's tolerance;Repositioned    Home Living                      Prior Function            PT Goals (current goals can now be found in the care plan section) Acute Rehab PT Goals Patient Stated Goal: return home PT Goal Formulation: With patient/family Time For Goal Achievement: 07/28/18 Potential to Achieve Goals: Fair Progress towards PT goals: Progressing toward goals    Frequency    Min 3X/week      PT Plan Current plan  remains appropriate    Co-evaluation              AM-PAC PT "6 Clicks" Mobility   Outcome Measure  Help needed turning from your back to your side while in a flat bed without using bedrails?: A Little Help needed moving from lying on your back to sitting on the side of a flat bed without using bedrails?: A Little Help needed moving to and from a bed to a chair (including a wheelchair)?: A Little Help needed standing up from a chair using your arms (e.g., wheelchair or bedside chair)?: A Little Help needed to walk in hospital room?: A Little Help needed climbing 3-5 steps with a railing? : A Lot 6 Click Score: 17    End of Session   Activity Tolerance: Patient limited by lethargy Patient left: in chair;with call bell/phone within reach;with chair alarm set;with restraints reapplied(mitten and chair alarm belt reapplied; fall mat down) Nurse Communication:  Mobility status PT Visit Diagnosis: Other abnormalities of gait and mobility (R26.89);Muscle weakness (generalized) (M62.81);History of falling (Z91.81);Repeated falls (R29.6);Difficulty in walking, not elsewhere classified (R26.2);Unsteadiness on feet (R26.81)     Time: 1124-1140 PT Time Calculation (min) (ACUTE ONLY): 16 min  Charges:  $Self Care/Home Management: [place], PT, DPT Acute Rehabilitation Services  Pager [phone] Office [place] 07/15/2018, 12:41 PM

## 2018-07-15 NOTE — TOC Progression Note (Addendum)
Transition of Care [facility]) - Progression Note    Patient Details  Name: Kaliegh Willadsen MRN: [id number] Date of Birth: 1928-11-14  Transition of Care [facility]) CM/SW [place], LCSW Phone Number: 07/15/2018, 12:32 PM  Clinical Narrative: Left daughter a voicemail. Will discuss home health options when she returns call.    1:44 pm: Read off CMS Medicare scores for home health agencies that serve patient's zip code. Daughter will research and review with her brother before making decision. Patient has a walker and cane at home. Daughter agreeable to 3-in-1 bedside commode. Patient's daughter is unable to provide 24/7 supervision at home since she works full time. Discussed staff concern with her going home and she validated that but reiterated more concern for her going to a SNF. Discussed that home health does not provide 24/7 care and she would have to look into a personal care service for that. CSW spoke with [facility] case manager about assistance they could provide in helping daughter locate extra resources.   Expected Discharge Plan: [place] Barriers to Discharge: Continued Medical Work up  Expected Discharge Plan and Services Expected Discharge Plan: [place] arrangements for the past 2 months: Apartment                                       Social Determinants of Health (SDOH) Interventions    Readmission Risk Interventions No flowsheet data found.

## 2018-07-15 NOTE — Consult Note (Addendum)
   [facility] CM Inpatient Consult   07/15/2018  Brianna Hunter 07/04/1928 [id number]  Referral received from inpatient [facility] Social Worker regarding post hospital follow up for care needs for personal care service resource assistance.  Chart reviewed from brief narrative of history and MD notes reveals: Brianna Hairstonis a 85 y.o.femalewithhistory of left breast DCIS status post lumpectomy treated with tamoxifen, hypertension, hyperlipidemia, dementia, anxiety, valvular heart disease and bifascicular block presenting with fall at home. Patient is unreliable historian due to dementia.  Call placed to [phone] and left a generic voice mail requesting a return call to an automated voicemail.  This phone is listed to Brianna Hunter, patient's daughter.  Will await a return call for follow up needs.  1700:  Call received from Brianna Hunter, patient's daughter.  HIPAA verified.  Explained [facility] [employer] services for request. She states she was looking for 24 hour care assistance.  She states the patient cannot live at her residence and states that "the conditions at my place is not conducive for her."  She is talking about SNF placement.  Encourage the daughter to speak with the inpatient [facility] SW regarding her post hospital transition of care regarding her questions for skilled nursing placement.  Will continue to follow as needed.  Natividad Brood, RN BSN [place] [facility] Liaison  [phone] business mobile phone Toll free office [phone]  Fax number: [phone] [email or web address]  [email or web address]

## 2018-07-15 NOTE — Progress Notes (Signed)
PROGRESS NOTE    Alyrica Thurow  MHD:[id number] DOB: 04/24/1928 DOA: 07/13/2018 PCP: [name], MD    Brief Narrative:  Shaylinn Hladik is a 85 y.o. female with history of left breast DCIS status post lumpectomy treated with tamoxifen, hypertension, hyperlipidemia, dementia, anxiety, valvular heart disease and bifascicular block presenting with fall at home. Patient is unreliable historian due to dementia. In ED, initial BP 136/110.  BP dropped to 83/62.  She was given 2 L normal saline bolus and blood pressure improved to 90s over 60s.  She also went into A. fib with RVR but converted back to sinus rhythm spontaneously.  CBC remarkable for hemoglobin to 9.9 and platelet of 126 but no leukocytosis.  CMP remarkable for hypokalemia to 3.2, bicarb 18, glucose 244, creatinine 3.72 (no recent value but 1.0 in 2017) BUN 56, anion gap 20, AST 160 and ALT 100.  Initial troponin 0 0.08.  EKG A. fib with RVR and bifascicular block (old).  Urinalysis with 15 ketones and few bacteria.  Lactic acid 4.3.  Chest x-ray without acute finding.  CT head negative.  COVID19 test negative. Received 2 L of normal saline bolus.  Blood cultures drawn. Started on cefepime, Flagyl and vancomycin and hospitalist service was called for sepsis.   Assessment & Plan:   Principal Problem:   Fall at home, initial encounter Active Problems:   HYPERTENSION, BENIGN   Microcytic anemia   Lactic acidosis   AKI (acute kidney injury) ([place])   High anion gap metabolic acidosis   Elevated liver enzymes   Thrombocytopenia (HCC)   Elevated troponin   Atrial fibrillation with RVR ([place])   Acute kidney injury (AKI) with acute tubular necrosis (ATN) ([place])  Fall at home, initial encounter: Mechanical fall.  Skeletal survey negative for any acute fractures.  She does have balance issues.  Continue to work with PT OT.  Anticipate patient will benefit with inpatient therapies at SNF or rehab - PT recommending 24 hour care at home but  this does not appear to be possible per discussion between case management and family.  New onset A. fib with RVR: Had transient RVR with A. fib that resolved spontaneously and currently sinus rhythm.  Patient is not anticoagulation candidate due to advanced dementia and frequent falling.  Currently sinus rhythm.  Acute kidney injury improving: Previous baseline creatinine of 1, presented with a creatinine of 3. Treated with IV fluids and improving to 1.5 today. CK levels fairly stable.  We will continue IV fluids and encourage p.o. intake.  Monitor intake and output.  Lactic acidosis with concurrent high anion gap metabolic acidosis, resolved: Due to dehydration. Unlikely infection.  Lactate has normalized with fluids. Patient was given broad-spectrum antibiotics with vancomycin, cefepime and Flagyl in the ER. No evidence of infection.  Previously discontinued all antibiotics and monitor -patient remains without leukocytosis or fever  Elevated liver enzyme, resolving: Probably due to hypotension, improving with IV fluids, no longer following  Elevated troponins: Probably due to acute kidney injury, RVR in the setting of profound dehydration and lactic acidosis. No ischemic change in EKG.  Remains flat.  ACS ruled out.  Echocardiogram as above EF 55-60% with mild diastolic dysfunction. No further workup or evaluation indicated.  History of hypertension: Hypotensive in the ER.  Holding all home BP medications. Will resume as BP improves.  Chronic microcytic anemia: Fairly stable.  DVT prophylaxis: Heparin subcu Code Status: Full code Family Communication: Daughter May called 3 separate times today with no answer Disposition  Plan: SNF vs home with 24h supervision.  Patient continues to be profoundly dehydrated with ongoing acute kidney injury requiring IV fluids given her profoundly poor p.o. intake.  She has at markedly elevated risk for decompensation deterioration without close monitoring, IV  fluids over the next 24 hours.  Position planning is somewhat difficult given family wishes to discharge patient home however per PT recommendations they cannot currently provide 24-hour during and care makes disposition home somewhat unsafe for the patient.  Tempted to discuss with patient's daughter again disposition to SNF for going physical therapy monitoring and treatment.   Consultants:   None  Procedures:   None  Antimicrobials:   Vancomycin, cefepime and Flagyl 1 dose in the ER. Not continued on the floor  Subjective: Patient slept poorly overnight, found awake most of the night, otherwise no acute issues or events.  Moderately somnolent/poorly interactive this morning, poorly ambulatory with physical therapy.  Patient has moderately advanced dementia.  Declines any current complaints/issues.  Objective: Vitals:   07/14/18 1923 07/15/18 0606 07/15/18 0609 07/15/18 1152  BP: 121/62  (!) 157/69 124/69  Pulse: 69  67 77  Resp: 18  18 15   Temp: (!) 14.7 F (36.4 C)  98 F (36.7 C)   TempSrc: Oral     SpO2: 100%   100%  Weight:  48.2 kg    Height:        Intake/Output Summary (Last 24 hours) at 07/15/2018 1408 Last data filed at 07/14/2018 2204 Gross per 24 hour  Intake 1327.98 ml  Output 200 ml  Net 1127.98 ml   Filed Weights   07/13/18 1135 07/14/18 0406 07/15/18 0606  Weight: 52.6 kg 49.9 kg 48.2 kg    Examination:  General exam: Appears calm and comfortable, on room air. Respiratory system: Clear to auscultation. Respiratory effort normal. Cardiovascular system: S1 & S2 heard, RRR. No JVD, murmurs, rubs, gallops or clicks. No pedal edema. Gastrointestinal system: Abdomen is nondistended, soft and nontender. No organomegaly or masses felt. Normal bowel sounds heard. Central nervous system: Awake, alert, oriented to herself only.  No focal neurological deficits. Extremities: Symmetric 4/5 bilaterally Skin: No rashes, lesions or ulcers Psychiatry: Unable to  properly assess given advanced dementia/current mental status  Data Reviewed: I have personally reviewed following labs and imaging studies  CBC: Recent Labs  Lab 07/13/18 1154 07/13/18 1821 07/14/18 0322  WBC 8.8  --  8.4  NEUTROABS 7.1  --   --   HGB 9.9* 10.5* 9.1*  HCT 32.1* 31.0* 28.0*  MCV 73.0*  --  69.7*  PLT 125*  --  829*   Basic Metabolic Panel: Recent Labs  Lab 07/13/18 1154 07/13/18 1821 07/14/18 0322 07/15/18 0516  NA 139 139 141 141  K 3.2* 3.6 3.8 3.4*  CL 101  --  106 107  CO2 18*  --  21* 24  GLUCOSE 244*  --  92 80  BUN 56*  --  61* 37*  CREATININE 3.72*  --  2.53* 1.53*  CALCIUM 9.3  --  9.2 9.3   GFR: Estimated Creatinine Clearance: 17 mL/min (A) (by C-G formula based on SCr of 1.53 mg/dL (H)). Liver Function Tests: Recent Labs  Lab 07/13/18 1154 07/14/18 0322  AST 160* 61*  ALT 100* 63*  ALKPHOS 71 64  BILITOT 0.5 0.7  PROT 6.6 6.1*  ALBUMIN 3.2* 2.9*   No results for input(s): LIPASE, AMYLASE in the last 168 hours. No results for input(s): AMMONIA in the last 168  hours. Coagulation Profile: No results for input(s): INR, PROTIME in the last 168 hours. Cardiac Enzymes: Recent Labs  Lab 07/13/18 1154 07/13/18 2101 07/14/18 0322 07/14/18 0744  CKTOTAL  --   --   --  257*  CKMB  --   --   --  6.2*  TROPONINI 0.08* 0.93* 0.88*  --    BNP (last 3 results) No results for input(s): PROBNP in the last 8760 hours. HbA1C: Recent Labs    07/13/18 1552  HGBA1C 5.9*   CBG: No results for input(s): GLUCAP in the last 168 hours. Lipid Profile: No results for input(s): CHOL, HDL, LDLCALC, TRIG, CHOLHDL, LDLDIRECT in the last 72 hours. Thyroid Function Tests: No results for input(s): TSH, T4TOTAL, FREET4, T3FREE, THYROIDAB in the last 72 hours. Anemia Panel: No results for input(s): VITAMINB12, FOLATE, FERRITIN, TIBC, IRON, RETICCTPCT in the last 72 hours. Sepsis Labs: Recent Labs  Lab 07/13/18 1404 07/13/18 1817 07/14/18 0744   LATICACIDVEN 4.3* 2.2* 1.3    Recent Results (from the past 240 hour(s))  Blood culture (routine x 2)     Status: None (Preliminary result)   Collection Time: 07/13/18  1:58 PM  Result Value Ref Range Status   Specimen Description BLOOD RIGHT ANTECUBITAL  Final   Special Requests   Final    BOTTLES DRAWN AEROBIC AND ANAEROBIC Blood Culture adequate volume   Culture   Final    NO GROWTH 2 DAYS Performed at [facility], [place] [address] [place], [place] [address]    Report Status PENDING  Incomplete  Blood culture (routine x 2)     Status: None (Preliminary result)   Collection Time: 07/13/18  2:01 PM  Result Value Ref Range Status   Specimen Description BLOOD RIGHT HAND  Final   Special Requests   Final    BOTTLES DRAWN AEROBIC AND ANAEROBIC Blood Culture results may not be optimal due to an inadequate volume of blood received in culture bottles   Culture   Final    NO GROWTH 2 DAYS Performed at [facility], [place] [address], [place],  [address]    Report Status PENDING  Incomplete  SARS Coronavirus 2 [facility] order, Performed in [place] hospital lab)     Status: None   Collection Time: 07/13/18  2:19 PM  Result Value Ref Range Status   SARS Coronavirus 2 NEGATIVE NEGATIVE Final    Comment: (NOTE) If result is NEGATIVE SARS-CoV-2 target nucleic acids are NOT DETECTED. The SARS-CoV-2 RNA is generally detectable in upper and lower  respiratory specimens during the acute phase of infection. The lowest  concentration of SARS-CoV-2 viral copies this assay can detect is 250  copies / mL. A negative result does not preclude SARS-CoV-2 infection  and should not be used as the sole basis for treatment or other  patient management decisions.  A negative result may occur with  improper specimen collection / handling, submission of specimen other  than nasopharyngeal swab, presence of viral mutation(s) within the  areas targeted by this assay, and inadequate number  of viral copies  (<250 copies / mL). A negative result must be combined with clinical  observations, patient history, and epidemiological information. If result is POSITIVE SARS-CoV-2 target nucleic acids are DETECTED. The SARS-CoV-2 RNA is generally detectable in upper and lower  respiratory specimens dur ing the acute phase of infection.  Positive  results are indicative of active infection with SARS-CoV-2.  Clinical  correlation with patient history  and other diagnostic information is  necessary to determine patient infection status.  Positive results do  not rule out bacterial infection or co-infection with other viruses. If result is PRESUMPTIVE POSTIVE SARS-CoV-2 nucleic acids MAY BE PRESENT.   A presumptive positive result was obtained on the submitted specimen  and confirmed on repeat testing.  While 2019 novel coronavirus  (SARS-CoV-2) nucleic acids may be present in the submitted sample  additional confirmatory testing may be necessary for epidemiological  and / or clinical management purposes  to differentiate between  SARS-CoV-2 and other Sarbecovirus currently known to infect humans.  If clinically indicated additional testing with an alternate test  methodology [phone]) is advised. The SARS-CoV-2 RNA is generally  detectable in upper and lower respiratory sp ecimens during the acute  phase of infection. The expected result is Negative. Fact Sheet for Patients:  StrictlyIdeas.no Fact Sheet for Healthcare Providers: BankingDealers.co.za This test is not yet approved or cleared by the [place] FDA and has been authorized for detection and/or diagnosis of SARS-CoV-2 by FDA under an Emergency Use Authorization (EUA).  This EUA will remain in effect (meaning this test can be used) for the duration of the COVID-19 declaration under Section 564(b)(1) of the Act, 21 U.S.C. section 360bbb-3(b)(1), unless the authorization is  terminated or revoked sooner. Performed at [facility], [place] [address] [place], [place] [address]   Urine Culture     Status: None   Collection Time: 07/13/18  2:48 PM  Result Value Ref Range Status   Specimen Description URINE, RANDOM  Final   Special Requests NONE  Final   Culture   Final    NO GROWTH Performed at [facility], [place] [address] [place], [place] [address]    Report Status 07/14/2018 FINAL  Final     Radiology Studies: No results found.  Scheduled Meds: . feeding supplement  1 Container Oral BID BM  . heparin  5,000 Units Subcutaneous Q8H  . senna  1 tablet Oral BID   Continuous Infusions: . sodium chloride Stopped (07/14/18 0023)  . sodium chloride 100 mL/hr at 07/15/18 0810     LOS: 1 day    Time spent: 25 minutes.    Little Ishikawa, MD [facility] Pager [phone]  If 7PM-7AM, please contact night-coverage [email or web address] Password [email or web address] 07/15/2018, 2:08 PM

## 2018-07-15 NOTE — Progress Notes (Addendum)
Initial Nutrition Assessment  DOCUMENTATION CODES:   Severe malnutrition in context of chronic illness  INTERVENTION:   -Continue Boost Breeze po BID, each supplement provides 250 kcal and 9 grams of protein -MVI with minerals daily -30 ml Prostat BID, each supplement provides 100 kcals and 15 grams protein -Soy milk TID with meals -Downgrade diet to dysphagia 3 diet for ease of intake  NUTRITION DIAGNOSIS:   Severe Malnutrition related to chronic illness(dementia) as evidenced by moderate fat depletion, severe fat depletion, moderate muscle depletion, severe muscle depletion.  GOAL:   Patient will meet greater than or equal to 90% of their needs  MONITOR:   PO intake, Supplement acceptance, Labs, Weight trends, Skin, I & O's  REASON FOR ASSESSMENT:   Other (Comment)    ASSESSMENT:   Brianna Hunter is a 85 y.o. female with history of left breast DCIS status post lumpectomy treated with tamoxifen, hypertension, hyperlipidemia, dementia, anxiety, valvular heart disease and bifascicular block presenting with fall at home.  Patient is unreliable historian due to dementia.  She is awake and oriented to self and place but not time.  History obtained from patient's daughter over the phone although patient daughter does not live with her and hasn't seen her in the last 3 days.  Pt admitted s/p fall.   Reviewed I/O's: +968 ml x 24 hours and +4 L since admission  UOP: 600 ml x 24 hours  Pt not interactive with this RD at time of visit. She did not respond to questions or touch.   RD observed breakfast tray; pt consumed only a few bites of sausage and sips of juice and Boost Breeze.   Reviewed wt hx; noted history of distant wt loss. No recent wt readings to assess at this time.  Pt with increased nutrient needs for wound healing and would benefit from nutritional supplements.   Per MD notes, plan to work with therapies with likely eventual plan to d/c to SNF.   Labs  reviewed.   NUTRITION - FOCUSED PHYSICAL EXAM:    Most Recent Value  Orbital Region  Severe depletion  Upper Arm Region  Severe depletion  Thoracic and Lumbar Region  Moderate depletion  Buccal Region  Moderate depletion  Temple Region  Severe depletion  Clavicle Bone Region  Severe depletion  Clavicle and Acromion Bone Region  Severe depletion  Scapular Bone Region  Severe depletion  Dorsal Hand  Moderate depletion  Patellar Region  Moderate depletion  Anterior Thigh Region  Moderate depletion  Posterior Calf Region  Moderate depletion  Edema (RD Assessment)  None  Hair  Reviewed  Eyes  Reviewed  Mouth  Reviewed  Skin  Reviewed  Nails  Reviewed       Diet Order:   Diet Order            Diet Heart Room service appropriate? Yes; Fluid consistency: Thin  Diet effective now              EDUCATION NEEDS:   No education needs have been identified at this time  Skin:  Skin Assessment: Skin Integrity Issues: Skin Integrity Issues:: Stage II Stage II: coccyx  Last BM:  07/14/18  Height:   Ht Readings from Last 1 Encounters:  07/13/18 4\' 9"  (1.448 m)    Weight:   Wt Readings from Last 1 Encounters:  07/15/18 48.2 kg    Ideal Body Weight:  43.2 kg  BMI:  Body mass index is 22.98 kg/m.  Estimated Nutritional Needs:  Kcal:  1300-1500  Protein:  55-70 grams  Fluid:  > 1.3 L    Payal Stanforth A. Jimmye Norman, RD, LDN, [place] Registered Dietitian II Certified Diabetes Care and Education Specialist Pager: [phone] After hours Pager: [phone]

## 2018-07-16 DIAGNOSIS — L8990 Pressure ulcer of unspecified site, unspecified stage: Secondary | ICD-10-CM

## 2018-07-16 DIAGNOSIS — E43 Unspecified severe protein-calorie malnutrition: Secondary | ICD-10-CM

## 2018-07-16 LAB — CBC
HCT: 30.3 % — ABNORMAL LOW (ref 36.0–46.0)
Hemoglobin: 9.5 g/dL — ABNORMAL LOW (ref 12.0–15.0)
MCH: 22.1 pg — ABNORMAL LOW (ref 26.0–34.0)
MCHC: 31.4 g/dL (ref 30.0–36.0)
MCV: 70.6 fL — ABNORMAL LOW (ref 80.0–100.0)
Platelets: 116 10*3/uL — ABNORMAL LOW (ref 150–400)
RBC: 4.29 MIL/uL (ref 3.87–5.11)
RDW: 14 % (ref 11.5–15.5)
WBC: 7.2 10*3/uL (ref 4.0–10.5)
nRBC: 0 % (ref 0.0–0.2)

## 2018-07-16 LAB — BASIC METABOLIC PANEL
Anion gap: 8 (ref 5–15)
BUN: 27 mg/dL — ABNORMAL HIGH (ref 8–23)
CO2: 23 mmol/L (ref 22–32)
Calcium: 8.4 mg/dL — ABNORMAL LOW (ref 8.9–10.3)
Chloride: 110 mmol/L (ref 98–111)
Creatinine, Ser: 1.09 mg/dL — ABNORMAL HIGH (ref 0.44–1.00)
GFR calc Af Amer: 52 mL/min — ABNORMAL LOW (ref >60)
GFR calc non Af Amer: 45 mL/min — ABNORMAL LOW (ref >60)
Glucose, Bld: 89 mg/dL (ref 70–99)
Potassium: 3.3 mmol/L — ABNORMAL LOW (ref 3.5–5.1)
Sodium: 141 mmol/L (ref 135–145)

## 2018-07-16 NOTE — NC FL2 (Signed)
[place] MEDICAID FL2 LEVEL OF CARE SCREENING TOOL     IDENTIFICATION  Patient Name: Brianna Hunter Birthdate: 1928-05-05 Sex: female Admission Date (Current Location): 07/13/2018  [facility] and [place] Number:  Herbalist and Address:  [facility], [place] [address], [place], [place] [address]      Provider Number: [id number]  Attending Physician Name and Address:  Little Ishikawa, MD  Relative Name and Phone Number:       Current Level of Care: Hospital Recommended Level of Care: [place] Prior Approval Number:    Date Approved/Denied:   PASRR Number: [id number]  Discharge Plan: SNF    Current Diagnoses: Patient Active Problem List   Diagnosis Date Noted  . Acute kidney injury (AKI) with acute tubular necrosis (ATN) ([place]) 07/14/2018  . Fall at home, initial encounter 07/13/2018  . Lactic acidosis 07/13/2018  . AKI (acute kidney injury) ([place]) 07/13/2018  . High anion gap metabolic acidosis 76/73/4193  . Elevated liver enzymes 07/13/2018  . Thrombocytopenia ([place]) 07/13/2018  . Elevated troponin 07/13/2018  . Atrial fibrillation with RVR ([place]) 07/13/2018  . Microcytic anemia 10/15/2013  . Poor dentition 08/09/2013  . Acute respiratory failure with hypoxia ([place]) 07/12/2013  . CAP (community acquired pneumonia) 07/11/2013  . Sepsis ([place]) 07/09/2013  . Cavitary lesion of lung 07/09/2013  . Leukocytosis 07/09/2013  . Protein-calorie malnutrition, moderate ([place]) 07/09/2013  . Hemoptysis 07/09/2013  . 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 ([place]) 10/07/2011  . Valvular heart disease 08/22/2010  . HYPERLIPIDEMIA-MIXED 09/06/2009  . AORTIC & MITRAL STENOSIS W/ INSUFFI, RHEUM/NON-RHEUM 09/06/2009  . HYPERTENSION, BENIGN 09/06/2009  . AORTIC STENOSIS/ INSUFFICIENCY, NON-RHEUMATIC 09/06/2009  . PALPITATIONS 09/06/2009  . ABDOMINAL MASS 09/06/2009    Orientation RESPIRATION BLADDER Height &  Weight     Self, Place  Normal Incontinent Weight: 104 lb 3.2 oz (47.3 kg) Height:  4\' 9"  (144.8 cm)  BEHAVIORAL SYMPTOMS/MOOD NEUROLOGICAL BOWEL NUTRITION STATUS  (Gets out of bed/chair on her own without calling for help.) (None) Continent Diet(DYS 3.)  AMBULATORY STATUS COMMUNICATION OF NEEDS Skin   Limited Assist Verbally PU Stage and Appropriate Care   PU Stage 2 Dressing: (Coccyx: Foam every 3 days.)                   Personal Care Assistance Level of Assistance  Bathing, Feeding, Dressing Bathing Assistance: Limited assistance Feeding assistance: Maximum assistance Dressing Assistance: Limited assistance     Functional Limitations Info  Sight, Hearing, Speech Sight Info: Adequate Hearing Info: Adequate Speech Info: Adequate    SPECIAL CARE FACTORS FREQUENCY  PT (By licensed PT), Blood pressure, OT (By licensed OT)     PT Frequency: 5 x week OT Frequency: 5 x week            Contractures Contractures Info: Not present    Additional Factors Info  Code Status, Allergies Code Status Info: Full code Allergies Info: Milk-related Compounds, Augmentin (Amoxicillin-pot Clavulanate)           Current Medications (07/16/2018):  This is the current hospital active medication list Current Facility-Administered Medications  Medication Dose Route Frequency Provider Last Rate Last Dose  . acetaminophen (TYLENOL) tablet 650 mg  650 mg Oral Q6H PRN Wendee Beavers T, MD       Or  . acetaminophen (TYLENOL) suppository 650 mg  650 mg Rectal Q6H PRN Mercy Riding, MD      . ALPRAZolam Duanne Moron) tablet 0.25  mg  0.25 mg Oral BID PRN Wendee Beavers T, MD      . bisacodyl (DULCOLAX) suppository 10 mg  10 mg Rectal Once Schorr, Rhetta Mura, NP      . feeding supplement (BOOST / RESOURCE BREEZE) liquid 1 Container  1 Container Oral BID BM Mercy Riding, MD   1 Container at 07/16/18 1024  . feeding supplement (PRO-STAT SUGAR FREE 64) liquid 30 mL  30 mL Oral BID Little Ishikawa, MD    30 mL at 07/16/18 1023  . heparin injection 5,000 Units  5,000 Units Subcutaneous Q8H Mercy Riding, MD   5,000 Units at 07/16/18 0558  . multivitamin with minerals tablet 1 tablet  1 tablet Oral Daily Little Ishikawa, MD   1 tablet at 07/16/18 1024  . polyethylene glycol (MIRALAX / GLYCOLAX) packet 17 g  17 g Oral Daily PRN Wendee Beavers T, MD      . senna (SENOKOT) tablet 8.6 mg  1 tablet Oral BID Wendee Beavers T, MD   8.6 mg at 07/16/18 1023  . traZODone (DESYREL) tablet 25 mg  25 mg Oral QHS PRN Mercy Riding, MD         Discharge Medications: Please see discharge summary for a list of discharge medications.  Relevant Imaging Results:  Relevant Lab Results:   Additional Information SS#: [id number]. Telesitter and mittens will be discontinued today. No combativeness. Just gets out of bed without assistance, pulls on things, etc due to confusion.  Candie Chroman, LCSW

## 2018-07-16 NOTE — Progress Notes (Signed)
Tele orders discontinued, tele removed.

## 2018-07-16 NOTE — TOC Progression Note (Signed)
Transition of Care [facility]) - Progression Note    Patient Details  Name: Ziyan Hillmer MRN: [id number] Date of Birth: [birth year]  Transition of [facility]) CM/SW [place], [place] Phone Number: 07/16/2018, 5:02 PM  Clinical Narrative:    Patient's daughter returned call- she states she works and can not be there to provide 24 hr supervision  As needed and agrees with the recommendation of [facility] Rehab at [facility] [place]. Patient's daughter gave CSW permission to fax out referrals to SNF's in the  [place] area. CSW will inform patient's daughter of bed offers for choice once they are available.   Thurmond Butts, MSW, [facility] Clinical Social Worker [phone]    Expected Discharge Plan: [place] Barriers to Discharge: Continued Medical Work up  Expected Discharge Plan and Services Expected Discharge Plan: [place] arrangements for the past 2 months: Apartment                                       Social Determinants of Health (SDOH) Interventions    Readmission Risk Interventions No flowsheet data found.

## 2018-07-16 NOTE — TOC Progression Note (Signed)
Transition of Care [facility]) - Progression Note    Patient Details  Name: Brianna Hunter MRN: [id number] Date of Birth: 06-Feb-1929  [facility]) CM/SW [place], [place] Phone Number: [phone] 07/16/2018, 3:36 PM  Clinical Narrative:    CSW called patient's daughter and left voice message to return call.   Thurmond Butts, MSW, [facility] Clinical Social Worker [phone]    Expected Discharge Plan: [place] Barriers to Discharge: Continued Medical Work up  Expected Discharge Plan and Services Expected Discharge Plan: [place] arrangements for the past 2 months: Apartment                                       Social Determinants of Health (SDOH) Interventions    Readmission Risk Interventions No flowsheet data found.

## 2018-07-16 NOTE — Progress Notes (Signed)
Tele sitter discontinued, safety mittens removed.

## 2018-07-16 NOTE — Progress Notes (Signed)
PROGRESS NOTE    Brianna Hunter  HBZ:[id number] DOB: Apr 30, 1928 DOA: 07/13/2018 PCP: [name], MD    Brief Narrative:  Brianna Hunter is a 85 y.o. female with history of left breast DCIS status post lumpectomy treated with tamoxifen, hypertension, hyperlipidemia, dementia, anxiety, valvular heart disease and bifascicular block presenting with fall at home. Patient is unreliable historian due to dementia. In ED, initial BP 136/110.  BP dropped to 83/62.  She was given 2 L normal saline bolus and blood pressure improved to 90s over 60s.  She also went into A. fib with RVR but converted back to sinus rhythm spontaneously.  CBC remarkable for hemoglobin to 9.9 and platelet of 126 but no leukocytosis.  CMP remarkable for hypokalemia to 3.2, bicarb 18, glucose 244, creatinine 3.72 (no recent value but 1.0 in 2017) BUN 56, anion gap 20, AST 160 and ALT 100.  Initial troponin 0 0.08.  EKG A. fib with RVR and bifascicular block (old).  Urinalysis with 15 ketones and few bacteria.  Lactic acid 4.3.  Chest x-ray without acute finding.  CT head negative.  COVID19 test negative. Received 2 L of normal saline bolus.  Blood cultures drawn. Started on cefepime, Flagyl and vancomycin and hospitalist service was called for sepsis.   Patient admitted as above with multiple ongoing falls and worsening ambulatory status - initially concerned for infection but this was ruled out based on labs, vitals and clinical improvement with fluids only. Patient was noted to have early afib RVR - likely in the setting of profound dehydration now resolved back to NSR with IV fluids. Patient more awake alert and tolerating PO now- PT evaluated and recommended SNF placement vs HH with 24 hour supervision. Currently awaiting decision from family member (daughter May) for placement later this afternoon (family phone call meeting planned at 13:30).  Assessment & Plan:   Principal Problem:   Fall at home, initial encounter Active  Problems:   HYPERTENSION, BENIGN   Microcytic anemia   Lactic acidosis   AKI (acute kidney injury) ([place])   High anion gap metabolic acidosis   Elevated liver enzymes   Thrombocytopenia ([facility])   Elevated troponin   Atrial fibrillation with RVR ([place])   Acute kidney injury (AKI) with acute tubular necrosis (ATN) ([place])  Fall at home, initial encounter, POA, improving but not yet back to baseline: Mechanical fall.  Skeletal survey negative for any acute fractures.  She does have balance issues.  Continue to work with PT OT.  Anticipate patient will benefit with inpatient therapies at SNF or rehab - PT recommending 24 hour care at home but this does not appear to be possible per discussion between case management and family - awaiting placement decision/discussion per family later today.  New onset A. fib with RVR, likely provoked and now resolved in NSR: Had transient RVR with A. fib that resolved spontaneously and currently sinus rhythm.  Patient is not anticoagulation candidate due to advanced dementia and frequent falling.  Currently sinus rhythm. ChadsVasc2: 4 Hasbled: 4  Acute kidney injury, POA, resolving: Previous baseline creatinine of 1, presented with a creatinine of 3. Treated with IV fluids and improving to 1.1 today. DC IV fluids given increased PO intake over the past 24 hours.  Lactic acidosis with concurrent high anion gap metabolic acidosis, resolved: Due to dehydration. Unlikely infection.  Lactate has normalized with fluids. Patient was given broad-spectrum antibiotics with vancomycin, cefepime and Flagyl in the ER. No evidence of infection.  Previously discontinued all antibiotics  and monitor -patient remains without leukocytosis or fever  Elevated liver enzyme, resolving: Probably due to hypotension, improving with IV fluids, no longer following  Elevated troponins: Probably due to acute kidney injury, RVR in the setting of profound dehydration and lactic acidosis. No ischemic  change in EKG.  Remains flat.  ACS ruled out.  Echocardiogram as above EF 55-60% with mild diastolic dysfunction. No further workup or evaluation indicated.  History of hypertension: Hypotensive in the ER.  Holding all home BP medications. Will resume as BP improves.  Chronic microcytic anemia: Fairly stable.  DVT prophylaxis: Heparin subcu Code Status: Full code Family Communication: Daughter May called 3 separate times today with no answer Disposition Plan: SNF vs home with 24h supervision.  Patient continues with poor p.o. intake but somewhat improving today when compared to yesterday.  She is at markedly elevated risk for decompensation deterioration without close monitoring. Disposition planning is somewhat difficult - currently awaiting family meeting at 13:30 on 5/7 for further information and decision - likely SNF given inability to provide 24h care and monitoring at home.   Consultants:   None  Procedures:   None  Antimicrobials:   Vancomycin, cefepime and Flagyl 1 dose in the ER. Not continued on the floor  Subjective: Patient slept quite well overnight with no acute issues or events reported at bedside this morning.  More awake and alert to person, declines any other complaints at this time.  Objective: Vitals:   07/15/18 1152 07/15/18 1812 07/15/18 2016 07/16/18 0612  BP: 124/69 (!) 159/72 (!) 148/86 (!) 146/65  Pulse: 77 73 76 (!) 58  Resp: 15   18  Temp:   98 F (36.7 C) 98 F (36.7 C)  TempSrc:   Oral Oral  SpO2: 100%  96% 96%  Weight:    47.3 kg  Height:        Intake/Output Summary (Last 24 hours) at 07/16/2018 1159 Last data filed at 07/16/2018 1145 Gross per 24 hour  Intake 4214.04 ml  Output 500 ml  Net 3714.04 ml   Filed Weights   07/14/18 0406 07/15/18 0606 07/16/18 0612  Weight: 49.9 kg 48.2 kg 47.3 kg    Examination:  General exam: Appears calm and comfortable, on room air. Respiratory system: Clear to auscultation. Respiratory effort  normal. Cardiovascular system: S1 & S2 heard, RRR. No JVD, murmurs, rubs, gallops or clicks. No pedal edema. Gastrointestinal system: Abdomen is nondistended, soft and nontender. No organomegaly or masses felt. Normal bowel sounds heard. Central nervous system: Awake, alert, oriented to herself only.  No focal neurological deficits. Extremities: Symmetric 4/5 bilaterally Skin: No rashes, lesions or ulcers Psychiatry: Unable to properly assess given advanced dementia/current mental status  Data Reviewed: I have personally reviewed following labs and imaging studies  CBC: Recent Labs  Lab 07/13/18 1154 07/13/18 1821 07/14/18 0322 07/16/18 0548  WBC 8.8  --  8.4 7.2  NEUTROABS 7.1  --   --   --   HGB 9.9* 10.5* 9.1* 9.5*  HCT 32.1* 31.0* 28.0* 30.3*  MCV 73.0*  --  69.7* 70.6*  PLT 125*  --  103* 130*   Basic Metabolic Panel: Recent Labs  Lab 07/13/18 1154 07/13/18 1821 07/14/18 0322 07/15/18 0516 07/16/18 0548  NA 139 139 141 141 141  K 3.2* 3.6 3.8 3.4* 3.3*  CL 101  --  106 107 110  CO2 18*  --  21* 24 23  GLUCOSE 244*  --  92 80 89  BUN 56*  --  61* 37* 27*  CREATININE 3.72*  --  2.53* 1.53* 1.09*  CALCIUM 9.3  --  9.2 9.3 8.4*   GFR: Estimated Creatinine Clearance: 23.7 mL/min (A) (by C-G formula based on SCr of 1.09 mg/dL (H)). Liver Function Tests: Recent Labs  Lab 07/13/18 1154 07/14/18 0322  AST 160* 61*  ALT 100* 63*  ALKPHOS 71 64  BILITOT 0.5 0.7  PROT 6.6 6.1*  ALBUMIN 3.2* 2.9*   No results for input(s): LIPASE, AMYLASE in the last 168 hours. No results for input(s): AMMONIA in the last 168 hours. Coagulation Profile: No results for input(s): INR, PROTIME in the last 168 hours. Cardiac Enzymes: Recent Labs  Lab 07/13/18 1154 07/13/18 2101 07/14/18 0322 07/14/18 0744  CKTOTAL  --   --   --  257*  CKMB  --   --   --  6.2*  TROPONINI 0.08* 0.93* 0.88*  --    BNP (last 3 results) No results for input(s): PROBNP in the last 8760 hours.  HbA1C: Recent Labs    07/13/18 1552  HGBA1C 5.9*   CBG: No results for input(s): GLUCAP in the last 168 hours. Lipid Profile: No results for input(s): CHOL, HDL, LDLCALC, TRIG, CHOLHDL, LDLDIRECT in the last 72 hours. Thyroid Function Tests: No results for input(s): TSH, T4TOTAL, FREET4, T3FREE, THYROIDAB in the last 72 hours. Anemia Panel: No results for input(s): VITAMINB12, FOLATE, FERRITIN, TIBC, IRON, RETICCTPCT in the last 72 hours. Sepsis Labs: Recent Labs  Lab 07/13/18 1404 07/13/18 1817 07/14/18 0744  LATICACIDVEN 4.3* 2.2* 1.3    Recent Results (from the past 240 hour(s))  Blood culture (routine x 2)     Status: None (Preliminary result)   Collection Time: 07/13/18  1:58 PM  Result Value Ref Range Status   Specimen Description BLOOD RIGHT ANTECUBITAL  Final   Special Requests   Final    BOTTLES DRAWN AEROBIC AND ANAEROBIC Blood Culture adequate volume   Culture   Final    NO GROWTH 3 DAYS Performed at [facility], [place] [address], [place], [place] [address]    Report Status PENDING  Incomplete  Blood culture (routine x 2)     Status: None (Preliminary result)   Collection Time: 07/13/18  2:01 PM  Result Value Ref Range Status   Specimen Description BLOOD RIGHT HAND  Final   Special Requests   Final    BOTTLES DRAWN AEROBIC AND ANAEROBIC Blood Culture results may not be optimal due to an inadequate volume of blood received in culture bottles   Culture   Final    NO GROWTH 3 DAYS Performed at [facility], [place] [address] [place], [place] [address]    Report Status PENDING  Incomplete  SARS Coronavirus 2 [facility] order, Performed in [place] hospital lab)     Status: None   Collection Time: 07/13/18  2:19 PM  Result Value Ref Range Status   SARS Coronavirus 2 NEGATIVE NEGATIVE Final    Comment: (NOTE) If result is NEGATIVE SARS-CoV-2 target nucleic acids are NOT DETECTED. The SARS-CoV-2 RNA is generally detectable in upper and lower   respiratory specimens during the acute phase of infection. The lowest  concentration of SARS-CoV-2 viral copies this assay can detect is 250  copies / mL. A negative result does not preclude SARS-CoV-2 infection  and should not be used as the sole basis for treatment or other  patient management decisions.  A negative result may occur with  improper specimen  collection / handling, submission of specimen other  than nasopharyngeal swab, presence of viral mutation(s) within the  areas targeted by this assay, and inadequate number of viral copies  (<250 copies / mL). A negative result must be combined with clinical  observations, patient history, and epidemiological information. If result is POSITIVE SARS-CoV-2 target nucleic acids are DETECTED. The SARS-CoV-2 RNA is generally detectable in upper and lower  respiratory specimens dur ing the acute phase of infection.  Positive  results are indicative of active infection with SARS-CoV-2.  Clinical  correlation with patient history and other diagnostic information is  necessary to determine patient infection status.  Positive results do  not rule out bacterial infection or co-infection with other viruses. If result is PRESUMPTIVE POSTIVE SARS-CoV-2 nucleic acids MAY BE PRESENT.   A presumptive positive result was obtained on the submitted specimen  and confirmed on repeat testing.  While 2019 novel coronavirus  (SARS-CoV-2) nucleic acids may be present in the submitted sample  additional confirmatory testing may be necessary for epidemiological  and / or clinical management purposes  to differentiate between  SARS-CoV-2 and other Sarbecovirus currently known to infect humans.  If clinically indicated additional testing with an alternate test  methodology [id number]) is advised. The SARS-CoV-2 RNA is generally  detectable in upper and lower respiratory sp ecimens during the acute  phase of infection. The expected result is Negative. Fact  Sheet for Patients:  StrictlyIdeas.no Fact Sheet for Healthcare Providers: BankingDealers.co.za This test is not yet approved or cleared by the [place] FDA and has been authorized for detection and/or diagnosis of SARS-CoV-2 by FDA under an Emergency Use Authorization (EUA).  This EUA will remain in effect (meaning this test can be used) for the duration of the COVID-19 declaration under Section 564(b)(1) of the Act, 21 U.S.C. section 360bbb-3(b)(1), unless the authorization is terminated or revoked sooner. Performed at [facility], [place] [address] [place], [place] [address]   Urine Culture     Status: None   Collection Time: 07/13/18  2:48 PM  Result Value Ref Range Status   Specimen Description URINE, RANDOM  Final   Special Requests NONE  Final   Culture   Final    NO GROWTH Performed at [facility], [place] [address] [place], [place] [address]    Report Status 07/14/2018 FINAL  Final     Radiology Studies: No results found.  Scheduled Meds: . bisacodyl  10 mg Rectal Once  . feeding supplement  1 Container Oral BID BM  . feeding supplement (PRO-STAT SUGAR FREE 64)  30 mL Oral BID  . heparin  5,000 Units Subcutaneous Q8H  . multivitamin with minerals  1 tablet Oral Daily  . senna  1 tablet Oral BID   Continuous Infusions: . sodium chloride Stopped (07/14/18 0023)     LOS: 2 days   Time spent: 37 minutes.  Little Ishikawa, MD [facility] Pager [phone]  If 7PM-7AM, please contact night-coverage [email or web address] Password [email or web address] 07/16/2018, 11:59 AM

## 2018-07-16 NOTE — Plan of Care (Signed)
  Problem: Education: Goal: Knowledge of General Education information will improve Description Including pain rating scale, medication(s)/side effects and non-pharmacologic comfort measures Outcome: Adequate for Discharge   Problem: Health Behavior/Discharge Planning: Goal: Ability to manage health-related needs will improve Outcome: Adequate for Discharge   

## 2018-07-16 NOTE — Progress Notes (Signed)
Physical Therapy Treatment Patient Details Name: Brianna Hunter MRN: [id number] DOB: [birth year] Today's Date: 07/16/2018    History of Present Illness Pt is an 85 y/o F admitted on 07/13/18 after 2 falls at home. Afib with RVR resolved in ED, dehydration. PMH includes breast CA, anxiety, arthritis, HLD, HTN, Lt TKA, dementia.    PT Comments    Pt remains limited with functional mobility secondary to generalized weakness, fatigue and cognitive deficits. Pt incontinent of bowel with bed mobility and transfers; however, completely unaware. Pt requiring min A for transfers for stability and safety with use of RW. Pt would continue to benefit from skilled physical therapy services at this time while admitted and after d/c to address the below listed limitations in order to improve overall safety and independence with functional mobility.    Follow Up Recommendations  SNF     Equipment Recommendations  3in1 (PT)    Recommendations for Other Services       Precautions / Restrictions Precautions Precautions: Fall Precaution Comments: blind left eye Restrictions Weight Bearing Restrictions: No    Mobility  Bed Mobility Overal bed mobility: Needs Assistance Bed Mobility: Supine to Sit     Supine to sit: Min guard     General bed mobility comments: multimodal cueing needed to perform task  Transfers Overall transfer level: Needs assistance Equipment used: Rolling walker (2 wheeled) Transfers: Sit to/from [facility] Sit to Stand: Min assist Stand pivot transfers: Min assist       General transfer comment: multimodal cueing, min A to power into standing from EOB and for stability with pivotal movements to [facility]; pt incontinent of bowel throughout  Ambulation/Gait             General Gait Details: deferred as pt incontinent of bowels   Stairs             Wheelchair Mobility    Modified Rankin (Stroke Patients Only)       Balance Overall  balance assessment: Needs assistance Sitting-balance support: Feet supported Sitting balance-Leahy Scale: Fair     Standing balance support: No upper extremity supported;Single extremity supported;Bilateral upper extremity supported Standing balance-Leahy Scale: Poor                              Cognition Arousal/Alertness: Awake/alert Behavior During Therapy: WFL for tasks assessed/performed Overall Cognitive Status: History of cognitive impairments - at baseline Area of Impairment: Orientation;Attention;Memory;Problem solving;Following commands;Safety/judgement                 Orientation Level: Disoriented to;Time;Situation;Place Current Attention Level: Sustained Memory: Decreased short-term memory Following Commands: Follows one step commands inconsistently Safety/Judgement: Decreased awareness of safety;Decreased awareness of deficits   Problem Solving: Slow processing;Decreased initiation;Requires verbal cues;Requires tactile cues        Exercises      General Comments        Pertinent Vitals/Pain Pain Assessment: No/denies pain    Home Living                      Prior Function            PT Goals (current goals can now be found in the care plan section) Acute Rehab PT Goals PT Goal Formulation: With patient/family Time For Goal Achievement: 07/28/18 Potential to Achieve Goals: Fair Progress towards PT goals: Progressing toward goals    Frequency    Min 3X/week  PT Plan Current plan remains appropriate    Co-evaluation              AM-PAC PT "6 Clicks" Mobility   Outcome Measure  Help needed turning from your back to your side while in a flat bed without using bedrails?: A Little Help needed moving from lying on your back to sitting on the side of a flat bed without using bedrails?: A Little Help needed moving to and from a bed to a chair (including a wheelchair)?: A Little Help needed standing up from a  chair using your arms (e.g., wheelchair or bedside chair)?: A Little Help needed to walk in hospital room?: A Little Help needed climbing 3-5 steps with a railing? : A Lot 6 Click Score: 17    End of Session   Activity Tolerance: Patient tolerated treatment well;Other (comment)(limited by incontinence) Patient left: with call bell/phone within reach;Other (comment)(nurse tech present with pt on [facility]) Nurse Communication: Mobility status PT Visit Diagnosis: Other abnormalities of gait and mobility (R26.89);Muscle weakness (generalized) (M62.81);History of falling (Z91.81);Repeated falls (R29.6);Difficulty in walking, not elsewhere classified (R26.2);Unsteadiness on feet (R26.81)     Time: 7026-3785 PT Time Calculation (min) (ACUTE ONLY): 18 min  Charges:  $Therapeutic Activity: 8-22 mins                     Sherie Don, PT, DPT  Acute Rehabilitation Services Pager [phone] Office [place] 07/16/2018, 2:15 PM

## 2018-07-17 LAB — BASIC METABOLIC PANEL
Anion gap: 11 (ref 5–15)
BUN: 28 mg/dL — ABNORMAL HIGH (ref 8–23)
CO2: 26 mmol/L (ref 22–32)
Calcium: 8.8 mg/dL — ABNORMAL LOW (ref 8.9–10.3)
Chloride: 102 mmol/L (ref 98–111)
Creatinine, Ser: 1.05 mg/dL — ABNORMAL HIGH (ref 0.44–1.00)
GFR calc Af Amer: 55 mL/min — ABNORMAL LOW (ref >60)
GFR calc non Af Amer: 47 mL/min — ABNORMAL LOW (ref >60)
Glucose, Bld: 122 mg/dL — ABNORMAL HIGH (ref 70–99)
Potassium: 3 mmol/L — ABNORMAL LOW (ref 3.5–5.1)
Sodium: 139 mmol/L (ref 135–145)

## 2018-07-17 LAB — CBC
HCT: 29.2 % — ABNORMAL LOW (ref 36.0–46.0)
Hemoglobin: 9.3 g/dL — ABNORMAL LOW (ref 12.0–15.0)
MCH: 22.3 pg — ABNORMAL LOW (ref 26.0–34.0)
MCHC: 31.8 g/dL (ref 30.0–36.0)
MCV: 70 fL — ABNORMAL LOW (ref 80.0–100.0)
Platelets: 134 10*3/uL — ABNORMAL LOW (ref 150–400)
RBC: 4.17 MIL/uL (ref 3.87–5.11)
RDW: 13.8 % (ref 11.5–15.5)
WBC: 10.5 10*3/uL (ref 4.0–10.5)
nRBC: 0 % (ref 0.0–0.2)

## 2018-07-17 MED ORDER — OLMESARTAN-AMLODIPINE-HCTZ 40-5-25 MG PO TABS: 1.0000 | ORAL_TABLET | Freq: Every day | ORAL | Status: DC

## 2018-07-17 MED ORDER — CITALOPRAM HYDROBROMIDE 20 MG PO TABS
20.0000 mg | ORAL_TABLET | Freq: Every day | ORAL | Status: DC
  Administered 2018-07-17 – 2018-07-20 (×4): 20 mg via ORAL
  Filled 2018-07-17 (×4): qty 1

## 2018-07-17 MED ORDER — AMLODIPINE BESYLATE 2.5 MG PO TABS
5.0000 mg | ORAL_TABLET | Freq: Every day | ORAL | Status: DC
  Administered 2018-07-17 – 2018-07-20 (×4): 5 mg via ORAL
  Filled 2018-07-17 (×4): qty 2

## 2018-07-17 MED ORDER — CYCLOSPORINE 0.05 % OP EMUL
1.0000 [drp] | Freq: Two times a day (BID) | OPHTHALMIC | Status: DC
  Administered 2018-07-17 – 2018-07-20 (×6): 1 [drp] via OPHTHALMIC
  Filled 2018-07-17 (×7): qty 30

## 2018-07-17 MED ORDER — HYDROCHLOROTHIAZIDE 25 MG PO TABS
25.0000 mg | ORAL_TABLET | Freq: Every day | ORAL | Status: DC
  Administered 2018-07-17 – 2018-07-20 (×4): 25 mg via ORAL
  Filled 2018-07-17 (×4): qty 1

## 2018-07-17 MED ORDER — POTASSIUM CHLORIDE 20 MEQ PO PACK
40.0000 meq | PACK | ORAL | Status: AC
  Administered 2018-07-17 (×2): 40 meq via ORAL
  Filled 2018-07-17 (×2): qty 2

## 2018-07-17 MED ORDER — LATANOPROST 0.005 % OP SOLN
1.0000 [drp] | Freq: Every day | OPHTHALMIC | Status: DC
  Administered 2018-07-17 – 2018-07-19 (×3): 1 [drp] via OPHTHALMIC
  Filled 2018-07-17: qty 2.5

## 2018-07-17 MED ORDER — IRBESARTAN 300 MG PO TABS
300.0000 mg | ORAL_TABLET | Freq: Every day | ORAL | Status: DC
  Administered 2018-07-17 – 2018-07-20 (×4): 300 mg via ORAL
  Filled 2018-07-17 (×4): qty 1

## 2018-07-17 MED ORDER — VITAMIN C 500 MG PO TABS
250.0000 mg | ORAL_TABLET | Freq: Every day | ORAL | Status: DC
  Administered 2018-07-17 – 2018-07-20 (×4): 250 mg via ORAL
  Filled 2018-07-17 (×4): qty 1

## 2018-07-17 NOTE — TOC Progression Note (Addendum)
Transition of Care [facility]) - Progression Note    Patient Details  Name: Brianna Hunter MRN: [id number] Date of Birth: [birth year]  Transition of [facility]) CM/SW [place], LCSW Phone Number: 07/17/2018, 1:43 PM  Clinical Narrative: Patient has one bed offer so far: [facility]. Left voicemail for her to let her know and provide CMS Medicare scores for this facility. CSW asked that she call CSW back after she has researched the facility and let me know decision.  2:08 pm: Daughter not willing to send patient to [facility] due to 1/5 star rating. Reviewed facilities that were still pending and she is not pleased with their scores either, mostly 2 or below. [facility] has five stars but has not responded yet. Admissions coordinator will review referral. Daughter initially said to plan for return home but is now willing to look outside of [place] area if needed.   Expected Discharge Plan: [place] Barriers to Discharge: Continued Medical Work up  Expected Discharge Plan and Services Expected Discharge Plan: [place] arrangements for the past 2 months: Apartment                                       Social Determinants of Health (SDOH) Interventions    Readmission Risk Interventions No flowsheet data found.

## 2018-07-17 NOTE — Progress Notes (Signed)
PROGRESS NOTE    Brianna Hunter  AYT:[id number] DOB: 1928/12/09 DOA: 07/13/2018 PCP: [name], MD    Brief Narrative:  Brianna Hunter is a 85 y.o. female with history of left breast DCIS status post lumpectomy treated with tamoxifen, hypertension, hyperlipidemia, dementia, anxiety, valvular heart disease and bifascicular block presenting with fall at home. Patient is unreliable historian due to dementia. In ED, initial BP 136/110.  BP dropped to 83/62.  She was given 2 L normal saline bolus and blood pressure improved to 90s over 60s.  She also went into A. fib with RVR but converted back to sinus rhythm spontaneously.  CBC remarkable for hemoglobin to 9.9 and platelet of 126 but no leukocytosis.  CMP remarkable for hypokalemia to 3.2, bicarb 18, glucose 244, creatinine 3.72 (no recent value but 1.0 in 2017) BUN 56, anion gap 20, AST 160 and ALT 100.  Initial troponin 0 0.08.  EKG A. fib with RVR and bifascicular block (old).  Urinalysis with 15 ketones and few bacteria.  Lactic acid 4.3.  Chest x-ray without acute finding.  CT head negative.  COVID19 test negative. Received 2 L of normal saline bolus.  Blood cultures drawn. Started on cefepime, Flagyl and vancomycin and hospitalist service was called for sepsis.   Patient admitted as above with multiple ongoing falls and worsening ambulatory status - initially concerned for infection but this was ruled out based on labs, vitals and clinical improvement with fluids only. Patient was noted to have early afib RVR - likely in the setting of profound dehydration now resolved back to NSR with IV fluids. Patient more awake alert and tolerating PO now- PT evaluated and recommended SNF placement vs HH with 24 hour supervision.  Family indicates they are unable to provide around-the-clock care and monitoring as discussed with PT.  Given this new information patient is an unsafe discharge for home with home health, will transition patient to SNF placement  per discussion with daughter May -management currently working with insurance and patient's family for placement.  Otherwise stable medically cleared for discharge once placement is available.   Assessment & Plan:   Principal Problem:   Fall at home, initial encounter Active Problems:   HYPERTENSION, BENIGN   Microcytic anemia   Lactic acidosis   AKI (acute kidney injury) ([place])   High anion gap metabolic acidosis   Elevated liver enzymes   Thrombocytopenia ([facility])   Elevated troponin   Atrial fibrillation with RVR ([place])   Acute kidney injury (AKI) with acute tubular necrosis (ATN) (H[facility])   Protein-calorie malnutrition, severe   Pressure injury of skin  Fall at home, initial encounter, POA, improving but not yet back to baseline: Mechanical fall.  Skeletal survey negative for any acute fractures.  She does have balance issues.  Continue to work with PT OT.  Anticipate patient will benefit with inpatient therapies at SNF -unable to be discharged home with home health due to inability to provide 24-hour care and monitoring as recommended by PT.  Awaiting placement at SNF  New onset A. fib with RVR, likely provoked and now resolved in NSR: Had transient RVR with A. fib that resolved spontaneously and currently sinus rhythm -provoked in the setting of stress, fall, dehydration lactic acidosis and AKI.  Patient is not anticoagulation candidate due to advanced dementia and frequent recurrent falls.  Currently sinus rhythm -rate controlled. ChadsVasc2: 4 Hasbled: 4  Acute kidney injury, POA, resolving: Previous baseline creatinine of 1, presented with a creatinine of 3. Treated with  IV fluids and improving to 1.1 today. DC IV fluids given increased PO intake over the past 24 hours.  Elevated CK levels, POA, resolved Minimally elevated - improved with IV fluids, no longer following.  Lactic acidosis with concurrent high anion gap metabolic acidosis, resolved: Due to dehydration. Unlikely  infection.  Lactate has normalized with fluids. Patient was given broad-spectrum antibiotics with vancomycin, cefepime and Flagyl in the ER. No evidence of infection.  Previously discontinued all antibiotics and monitor -patient remains without leukocytosis or fever  Elevated liver enzyme, resolving: Probably due to transient issues with hypotension, resolving with resolution of hypotension, no longer following  Elevated troponin: Multifactorial in the setting of acute kidney injury, RVR, and profound dehydration with elevated lactic acidosis. No ischemic change in EKG.  Remains flat.  ACS ruled out.  Echocardiogram as above EF 55-60% with mild diastolic dysfunction. No further workup or evaluation indicated.  History of hypertension: Hypotensive in the ER.  Resume home medications now that patient's blood pressure has improved/if not borderline elevated  Dementia, unspecified, without noted mood disorder  Resume home medications including citalopram, alprazolam, trazodone  Chronic microcytic anemia: Fairly stable.  DVT prophylaxis: Heparin subcu Code Status: Full code Family Communication: Discussed with daughter May Disposition Plan: SNF pending further discussion between patient's daughter and case management as well as insurance approval.  Daughter has of yet to specify which facility she would like her mother to be placed in.  Consultants:   None  Procedures:   None  Antimicrobials:   Vancomycin, cefepime and Flagyl 1 dose in the ER. Not continued on the floor  Subjective: No acute issues or events overnight, patient awake alert to person and general situation this morning, not alert to time or place.  Clines chest pain, shortness of breath, nausea, vomiting, diarrhea, constipation, headache, fevers, chills. Objective: Vitals:   07/16/18 1947 07/17/18 0428 07/17/18 0847 07/17/18 1130  BP: (!) 143/68 (!) 155/74 (!) 146/77 (!) 146/66  Pulse: 74 70 73 76  Resp: 18 18 16 18    Temp: 99.4 F (37.4 C) 97.9 F (36.6 C) 99.3 F (37.4 C) 98.5 F (36.9 C)  TempSrc: Oral Oral Oral Oral  SpO2: 100% 99% 98% 100%  Weight:  49.4 kg    Height:        Intake/Output Summary (Last 24 hours) at 07/17/2018 1210 Last data filed at 07/17/2018 0830 Gross per 24 hour  Intake 940 ml  Output 1400 ml  Net -460 ml   Filed Weights   07/15/18 0606 07/16/18 0612 07/17/18 0428  Weight: 48.2 kg 47.3 kg 49.4 kg    Examination:  General exam: Appears calm and comfortable, on room air. Respiratory system: Clear to auscultation. Respiratory effort normal. Cardiovascular system: S1 & S2 heard, RRR. No JVD, murmurs, rubs, gallops or clicks. No pedal edema. Gastrointestinal system: Abdomen is nondistended, soft and nontender. No organomegaly or masses felt. Normal bowel sounds heard. Central nervous system: Awake, alert, oriented to herself only.  No focal neurological deficits. Extremities: Symmetric 4/5 bilaterally Skin: No rashes, lesions or ulcers Psychiatry: Unable to properly assess given advanced dementia/current mental status  Data Reviewed: I have personally reviewed following labs and imaging studies  CBC: Recent Labs  Lab 07/13/18 1154 07/13/18 1821 07/14/18 0322 07/16/18 0548 07/17/18 0249  WBC 8.8  --  8.4 7.2 10.5  NEUTROABS 7.1  --   --   --   --   HGB 9.9* 10.5* 9.1* 9.5* 9.3*  HCT 32.1* 31.0* 28.0* 30.3*  29.2*  MCV 73.0*  --  69.7* 70.6* 70.0*  PLT 125*  --  103* 116* 867*   Basic Metabolic Panel: Recent Labs  Lab 07/13/18 1154 07/13/18 1821 07/14/18 0322 07/15/18 0516 07/16/18 0548 07/17/18 0249  NA 139 139 141 141 141 139  K 3.2* 3.6 3.8 3.4* 3.3* 3.0*  CL 101  --  106 107 110 102  CO2 18*  --  21* 24 23 26   GLUCOSE 244*  --  92 80 89 122*  BUN 56*  --  61* 37* 27* 28*  CREATININE 3.72*  --  2.53* 1.53* 1.09* 1.05*  CALCIUM 9.3  --  9.2 9.3 8.4* 8.8*   GFR: Estimated Creatinine Clearance: 25.1 mL/min (A) (by C-G formula based on SCr of 1.05  mg/dL (H)). Liver Function Tests: Recent Labs  Lab 07/13/18 1154 07/14/18 0322  AST 160* 61*  ALT 100* 63*  ALKPHOS 71 64  BILITOT 0.5 0.7  PROT 6.6 6.1*  ALBUMIN 3.2* 2.9*   No results for input(s): LIPASE, AMYLASE in the last 168 hours. No results for input(s): AMMONIA in the last 168 hours. Coagulation Profile: No results for input(s): INR, PROTIME in the last 168 hours. Cardiac Enzymes: Recent Labs  Lab 07/13/18 1154 07/13/18 2101 07/14/18 0322 07/14/18 0744  CKTOTAL  --   --   --  257*  CKMB  --   --   --  6.2*  TROPONINI 0.08* 0.93* 0.88*  --    BNP (last 3 results) No results for input(s): PROBNP in the last 8760 hours. HbA1C: No results for input(s): HGBA1C in the last 72 hours. CBG: No results for input(s): GLUCAP in the last 168 hours. Lipid Profile: No results for input(s): CHOL, HDL, LDLCALC, TRIG, CHOLHDL, LDLDIRECT in the last 72 hours. Thyroid Function Tests: No results for input(s): TSH, T4TOTAL, FREET4, T3FREE, THYROIDAB in the last 72 hours. Anemia Panel: No results for input(s): VITAMINB12, FOLATE, FERRITIN, TIBC, IRON, RETICCTPCT in the last 72 hours. Sepsis Labs: Recent Labs  Lab 07/13/18 1404 07/13/18 1817 07/14/18 0744  LATICACIDVEN 4.3* 2.2* 1.3    Recent Results (from the past 240 hour(s))  Blood culture (routine x 2)     Status: None (Preliminary result)   Collection Time: 07/13/18  1:58 PM  Result Value Ref Range Status   Specimen Description BLOOD RIGHT ANTECUBITAL  Final   Special Requests   Final    BOTTLES DRAWN AEROBIC AND ANAEROBIC Blood Culture adequate volume   Culture   Final    NO GROWTH 4 DAYS Performed at [facility], [place] [address], [place], [place] [address]    Report Status PENDING  Incomplete  Blood culture (routine x 2)     Status: None (Preliminary result)   Collection Time: 07/13/18  2:01 PM  Result Value Ref Range Status   Specimen Description BLOOD RIGHT HAND  Final   Special Requests   Final     BOTTLES DRAWN AEROBIC AND ANAEROBIC Blood Culture results may not be optimal due to an inadequate volume of blood received in culture bottles   Culture   Final    NO GROWTH 4 DAYS Performed at [facility], [place] [address], [place], [place] [address]    Report Status PENDING  Incomplete  SARS Coronavirus 2 [facility] order, Performed in [place] hospital lab)     Status: None   Collection Time: 07/13/18  2:19 PM  Result Value Ref Range Status   SARS Coronavirus  2 NEGATIVE NEGATIVE Final    Comment: (NOTE) If result is NEGATIVE SARS-CoV-2 target nucleic acids are NOT DETECTED. The SARS-CoV-2 RNA is generally detectable in upper and lower  respiratory specimens during the acute phase of infection. The lowest  concentration of SARS-CoV-2 viral copies this assay can detect is 250  copies / mL. A negative result does not preclude SARS-CoV-2 infection  and should not be used as the sole basis for treatment or other  patient management decisions.  A negative result may occur with  improper specimen collection / handling, submission of specimen other  than nasopharyngeal swab, presence of viral mutation(s) within the  areas targeted by this assay, and inadequate number of viral copies  (<250 copies / mL). A negative result must be combined with clinical  observations, patient history, and epidemiological information. If result is POSITIVE SARS-CoV-2 target nucleic acids are DETECTED. The SARS-CoV-2 RNA is generally detectable in upper and lower  respiratory specimens dur ing the acute phase of infection.  Positive  results are indicative of active infection with SARS-CoV-2.  Clinical  correlation with patient history and other diagnostic information is  necessary to determine patient infection status.  Positive results do  not rule out bacterial infection or co-infection with other viruses. If result is PRESUMPTIVE POSTIVE SARS-CoV-2 nucleic acids MAY BE PRESENT.   A presumptive  positive result was obtained on the submitted specimen  and confirmed on repeat testing.  While 2019 novel coronavirus  (SARS-CoV-2) nucleic acids may be present in the submitted sample  additional confirmatory testing may be necessary for epidemiological  and / or clinical management purposes  to differentiate between  SARS-CoV-2 and other Sarbecovirus currently known to infect humans.  If clinically indicated additional testing with an alternate test  methodology [phone]) is advised. The SARS-CoV-2 RNA is generally  detectable in upper and lower respiratory sp ecimens during the acute  phase of infection. The expected result is Negative. Fact Sheet for Patients:  StrictlyIdeas.no Fact Sheet for Healthcare Providers: BankingDealers.co.za This test is not yet approved or cleared by the [place] FDA and has been authorized for detection and/or diagnosis of SARS-CoV-2 by FDA under an Emergency Use Authorization (EUA).  This EUA will remain in effect (meaning this test can be used) for the duration of the COVID-19 declaration under Section 564(b)(1) of the Act, 21 U.S.C. section 360bbb-3(b)(1), unless the authorization is terminated or revoked sooner. Performed at [facility], [place] [address], [place], [place] [address]   Urine Culture     Status: None   Collection Time: 07/13/18  2:48 PM  Result Value Ref Range Status   Specimen Description URINE, RANDOM  Final   Special Requests NONE  Final   Culture   Final    NO GROWTH Performed at [facility], [place] [address] [place], [place] [address]    Report Status 07/14/2018 FINAL  Final     Radiology Studies: No results found.  Scheduled Meds: . bisacodyl  10 mg Rectal Once  . feeding supplement  1 Container Oral BID BM  . feeding supplement (PRO-STAT SUGAR FREE 64)  30 mL Oral BID  . heparin  5,000 Units Subcutaneous Q8H  . multivitamin with minerals  1 tablet Oral  Daily  . potassium chloride  40 mEq Oral Q4H  . senna  1 tablet Oral BID   Continuous Infusions:    LOS: 3 days   Time spent: 37 minutes.  Little Ishikawa, MD [facility] Pager [phone]  If 7PM-7AM, please contact night-coverage [email or web address] Password [email or web address] 07/17/2018, 12:10 PM

## 2018-07-17 NOTE — Progress Notes (Signed)
Physical Therapy Treatment Patient Details Name: Brianna Hunter MRN: [id number] DOB: 01/20/1929 Today's Date: 07/17/2018    History of Present Illness Pt is an 85 y/o F admitted on 07/13/18 after 2 falls at home. Afib with RVR resolved in ED, dehydration. PMH includes breast CA, anxiety, arthritis, HLD, HTN, Lt TKA, dementia.    PT Comments    Pt with increased lethargy this session with inability to maintain eyes open or follow commands as she was able to do on eval. Pt lethargy severely limiting pt function unsafe to attempt gait with pt unable to voice needs or concerns or even follow commands consistently for seated HEP. Pt remains unsafe to be home alone with SNF appropriate. Will continue to follow and progress as pt able.   Follow Up Recommendations  SNF;Supervision/Assistance - 24 hour     Equipment Recommendations  3in1 (PT)    Recommendations for Other Services       Precautions / Restrictions Precautions Precautions: Fall Precaution Comments: blind left eye    Mobility  Bed Mobility               General bed mobility comments: pt in chair on arrival  Transfers Overall transfer level: Needs assistance   Transfers: Sit to/from Stand;Stand Pivot Transfers Sit to Stand: Min assist Stand pivot transfers: Mod assist       General transfer comment: multimodal cueing, increased time to rise from chair and pivot to [facility] and back with RW. pt unable to voice need to toilet but with assist to place pt on toilet she was able to void and stood for pericare.   Ambulation/Gait             General Gait Details: Pt unable to maintain attention and follow cues for gait today   Stairs             Wheelchair Mobility    Modified Rankin (Stroke Patients Only)       Balance Overall balance assessment: Needs assistance Sitting-balance support: Feet supported Sitting balance-Leahy Scale: Fair Sitting balance - Comments: able to sit safely at [place]: Lethargic Behavior During Therapy: Flat affect Overall Cognitive Status: No family/caregiver present to determine baseline cognitive functioning Area of Impairment: Orientation;Attention;Memory;Problem solving;Following commands;Safety/judgement                 Orientation Level: Disoriented to;Time;Situation;Place Current Attention Level: Focused Memory: Decreased short-term memory Following Commands: Follows one step commands inconsistently Safety/Judgement: Decreased awareness of safety;Decreased awareness of deficits   Problem Solving: Slow processing;Decreased initiation;Requires verbal cues;Requires tactile cues General Comments: pt maintaining eyes closed again today with max cues to open will maintain a few seconds. Pt not responding to commands or questions consistently today. Able to state "what?" or "done" but no other verbalization      Exercises General Exercises - Lower Extremity Long Arc Quad: AAROM;10 reps;Seated;Both    General Comments        Pertinent Vitals/Pain Pain Assessment: No/denies pain    Home Living                      Prior Function            PT Goals (current goals can now be found in the care plan section) Progress  towards PT goals: Progressing toward goals    Frequency           PT Plan Current plan remains appropriate    Co-evaluation              AM-PAC PT "6 Clicks" Mobility   Outcome Measure  Help needed turning from your back to your side while in a flat bed without using bedrails?: A Little Help needed moving from lying on your back to sitting on the side of a flat bed without using bedrails?: A Little Help needed moving to and from a bed to a chair (including a wheelchair)?: A Lot Help needed standing up from a chair using your arms (e.g., wheelchair or bedside chair)?: A Little Help needed to walk in hospital room?: A  Lot Help needed climbing 3-5 steps with a railing? : A Lot 6 Click Score: 15    End of Session Equipment Utilized During Treatment: Gait belt Activity Tolerance: Patient limited by lethargy Patient left: in chair;with call bell/phone within reach;with chair alarm set(chair alarm belt with explanation to pt) Nurse Communication: Mobility status PT Visit Diagnosis: Other abnormalities of gait and mobility (R26.89);Muscle weakness (generalized) (M62.81);History of falling (Z91.81);Repeated falls (R29.6);Difficulty in walking, not elsewhere classified (R26.2);Unsteadiness on feet (R26.81)     Time: 5615-3794 PT Time Calculation (min) (ACUTE ONLY): 14 min  Charges:  $Therapeutic Activity: 8-22 mins                     [place], PT Acute Rehabilitation Services Pager: [phone] Office: [place] 07/17/2018, 11:56 AM

## 2018-07-17 NOTE — Plan of Care (Signed)

## 2018-07-17 NOTE — Progress Notes (Signed)
Occupational Therapy Treatment Patient Details Name: Brianna Hunter MRN: [id number] DOB: May 12, 1928 Today's Date: 07/17/2018    History of present illness Pt is an 85 y/o F admitted on 07/13/18 after 2 falls at home. Afib with RVR resolved in ED, dehydration. PMH includes breast CA, anxiety, arthritis, HLD, HTN, Lt TKA, dementia.   OT comments  Pt. Up in recliner upon arrival.  Presented with familiar task for focus on grooming tasks for skilled OT session.  Pt. Nodded when handed her head wrap and instantly folded it and donned it without assistance.  Unable to tie it and did require assistance for that portion.    Follow Up Recommendations  SNF;Supervision/Assistance - 24 hour    Equipment Recommendations  Other (comment)    Recommendations for Other Services      Precautions / Restrictions Precautions Precautions: Fall Precaution Comments: blind left eye       Mobility Bed Mobility               General bed mobility comments: pt in chair on arrival  Transfers Overall transfer level: Needs assistance   Transfers: Sit to/from Stand;Stand Pivot Transfers Sit to Stand: Min assist Stand pivot transfers: Mod assist       General transfer comment: multimodal cueing, increased time to rise from chair and pivot to [facility] and back with RW. pt unable to voice need to toilet but with assist to place pt on toilet she was able to void and stood for pericare.     Balance Overall balance assessment: Needs assistance Sitting-balance support: Feet supported Sitting balance-Leahy Scale: Fair Sitting balance - Comments: able to sit safely at [facility]                                   ADL either performed or assessed with clinical judgement   ADL Overall ADL's : Needs assistance/impaired     Grooming: Set up;Minimal assistance Grooming Details (indicate cue type and reason): pt. able to partially don head wrap but unable to tie in the back without assistance                                General ADL Comments: set up initially with familiar task of donning head wrap.  pt. unable to complete full task without assistance     Vision       Perception     Praxis      Cognition Arousal/Alertness: Awake/alert Behavior During Therapy: Flat affect Overall Cognitive Status: No family/caregiver present to determine baseline cognitive functioning Area of Impairment: Orientation;Attention;Memory;Problem solving;Following commands;Safety/judgement                 Orientation Level: Disoriented to;Time;Situation;Place Current Attention Level: Focused Memory: Decreased short-term memory Following Commands: Follows one step commands inconsistently Safety/Judgement: Decreased awareness of safety;Decreased awareness of deficits   Problem Solving: Slow processing;Decreased initiation;Requires verbal cues;Requires tactile cues General Comments: pt maintaining eyes closed again today with max cues to open will maintain a few seconds. Pt not responding to commands or questions consistently today. Able to state "what?" or "done" but no other verbalization        Exercises General Exercises - Lower Extremity Long Arc Quad: AAROM;10 reps;Seated;Both   Shoulder Instructions       General Comments      Pertinent Vitals/ Pain  Pain Assessment: No/denies pain  Home Living                                          Prior Functioning/Environment              Frequency  Min 2X/week        Progress Toward Goals  OT Goals(current goals can now be found in the care plan section)  Progress towards OT goals: Progressing toward goals     Plan      Co-evaluation                 AM-PAC OT "6 Clicks" Daily Activity     Outcome Measure   Help from another person eating meals?: A Lot Help from another person taking care of personal grooming?: A Lot Help from another person toileting, which includes  using toliet, bedpan, or urinal?: A Lot Help from another person bathing (including washing, rinsing, drying)?: A Lot Help from another person to put on and taking off regular upper body clothing?: A Lot Help from another person to put on and taking off regular lower body clothing?: A Lot 6 Click Score: 12    End of Session    OT Visit Diagnosis: Unsteadiness on feet (R26.81);Other abnormalities of gait and mobility (R26.89);Repeated falls (R29.6);History of falling (Z91.81);Muscle weakness (generalized) (M62.81);Adult, failure to thrive (R62.7);Other symptoms and signs involving cognitive function   Activity Tolerance Patient tolerated treatment well   Patient Left in chair;with call bell/phone within reach;Other (comment)(posey belt)   Nurse Communication Other (comment)(posey belt did not appear to be secured properly rn notificed and came in and adjusted it )        Time: 4492-0100 OT Time Calculation (min): 9 min  Charges: OT General Charges $OT Visit: 1 Visit OT Treatments $Self Care/Home Management : 8-22 mins  Janice Coffin, COTA/L 07/17/2018, 12:08 PM

## 2018-07-18 LAB — BASIC METABOLIC PANEL
Anion gap: 9 (ref 5–15)
BUN: 35 mg/dL — ABNORMAL HIGH (ref 8–23)
CO2: 27 mmol/L (ref 22–32)
Calcium: 8.7 mg/dL — ABNORMAL LOW (ref 8.9–10.3)
Chloride: 104 mmol/L (ref 98–111)
Creatinine, Ser: 1.08 mg/dL — ABNORMAL HIGH (ref 0.44–1.00)
GFR calc Af Amer: 53 mL/min — ABNORMAL LOW (ref >60)
GFR calc non Af Amer: 46 mL/min — ABNORMAL LOW (ref >60)
Glucose, Bld: 143 mg/dL — ABNORMAL HIGH (ref 70–99)
Potassium: 3 mmol/L — ABNORMAL LOW (ref 3.5–5.1)
Sodium: 140 mmol/L (ref 135–145)

## 2018-07-18 LAB — CBC
HCT: 26.3 % — ABNORMAL LOW (ref 36.0–46.0)
Hemoglobin: 8.7 g/dL — ABNORMAL LOW (ref 12.0–15.0)
MCH: 23 pg — ABNORMAL LOW (ref 26.0–34.0)
MCHC: 33.1 g/dL (ref 30.0–36.0)
MCV: 69.6 fL — ABNORMAL LOW (ref 80.0–100.0)
Platelets: 132 10*3/uL — ABNORMAL LOW (ref 150–400)
RBC: 3.78 MIL/uL — ABNORMAL LOW (ref 3.87–5.11)
RDW: 13.6 % (ref 11.5–15.5)
WBC: 13.8 10*3/uL — ABNORMAL HIGH (ref 4.0–10.5)
nRBC: 0 % (ref 0.0–0.2)

## 2018-07-18 NOTE — Progress Notes (Signed)
PROGRESS NOTE    Brianna Hunter  KXF:[id number] DOB: 1929/02/10 DOA: 07/13/2018 PCP: [name], MD    Brief Narrative:  Brianna Hunter is a 85 y.o. female with history of left breast DCIS status post lumpectomy treated with tamoxifen, hypertension, hyperlipidemia, dementia, anxiety, valvular heart disease and bifascicular block presenting with fall at home. Patient is unreliable historian due to dementia. In ED, initial BP 136/110.  BP dropped to 83/62.  She was given 2 L normal saline bolus and blood pressure improved to 90s over 60s.  She also went into A. fib with RVR but converted back to sinus rhythm spontaneously.  CBC remarkable for hemoglobin to 9.9 and platelet of 126 but no leukocytosis.  CMP remarkable for hypokalemia to 3.2, bicarb 18, glucose 244, creatinine 3.72 (no recent value but 1.0 in 2017) BUN 56, anion gap 20, AST 160 and ALT 100.  Initial troponin 0 0.08.  EKG A. fib with RVR and bifascicular block (old).  Urinalysis with 15 ketones and few bacteria.  Lactic acid 4.3.  Chest x-ray without acute finding.  CT head negative.  COVID19 test negative. Received 2 L of normal saline bolus.  Blood cultures drawn. Started on cefepime, Flagyl and vancomycin and hospitalist service was called for sepsis.   Patient admitted as above with multiple ongoing falls and worsening ambulatory status - initially concerned for infection but this was ruled out based on labs, vitals and clinical improvement with fluids only. Patient was noted to have early afib RVR - likely in the setting of profound dehydration now resolved back to NSR with IV fluids. Patient more awake alert and tolerating PO now- PT evaluated and recommended SNF placement vs HH with 24 hour supervision.  Family indicates they are unable to provide around-the-clock care and monitoring as discussed with PT.  Given this new information patient is an unsafe discharge for home with home health, will transition patient to SNF placement  per discussion with daughter May -management currently working with insurance and patient's family for placement.  Patient remains medically stable for discharge, pending disposition to SNF awaiting bed ability and subsequent family/insurance approval.  Assessment & Plan:   Principal Problem:   Fall at home, initial encounter Active Problems:   HYPERTENSION, BENIGN   Microcytic anemia   Lactic acidosis   AKI (acute kidney injury) ([place])   High anion gap metabolic acidosis   Elevated liver enzymes   Thrombocytopenia ([facility])   Elevated troponin   Atrial fibrillation with RVR ([place])   Acute kidney injury (AKI) with acute tubular necrosis (ATN) (H[facility])   Protein-calorie malnutrition, severe   Pressure injury of skin  Fall at home, initial encounter, POA, improving but not yet back to baseline: Mechanical fall.  Skeletal survey negative for any acute fractures.  She does have balance issues.  Continue to work with PT OT.  Anticipate patient will benefit with inpatient therapies at SNF -unable to be discharged home with home health due to inability to provide 24-hour care and monitoring as recommended by PT.  Awaiting placement at SNF as below.  New onset A. fib with RVR, likely provoked and now resolved in NSR: Had transient RVR with A. fib that resolved spontaneously and currently sinus rhythm -provoked in the setting of stress, fall, dehydration lactic acidosis and AKI.  Patient is not anticoagulation candidate due to advanced dementia and frequent recurrent falls.  Currently sinus rhythm -rate controlled. ChadsVasc2: 4 Hasbled: 4  Acute kidney injury, POA, resolving: Previous baseline creatinine of 1,  presented with a creatinine of 3. Treated with IV fluids and improving to 1.1 today. DC IV fluids given increased PO intake over the past 24 hours.  Elevated CK levels, POA, resolved Minimally elevated - improved with IV fluids, no longer following.  Lactic acidosis with concurrent high anion gap  metabolic acidosis, resolved: Due to dehydration. Unlikely infection.  Lactate has normalized with fluids. Patient was given broad-spectrum antibiotics with vancomycin, cefepime and Flagyl in the ER. No evidence of infection.  Previously discontinued all antibiotics and monitor -patient remains without leukocytosis or fever  Elevated liver enzyme, resolving: Probably due to transient issues with hypotension, resolving with resolution of hypotension, no longer following  Elevated troponin: Multifactorial in the setting of acute kidney injury, RVR, and profound dehydration with elevated lactic acidosis. No ischemic change in EKG.  Remains flat.  ACS ruled out.  Echocardiogram as above EF 55-60% with mild diastolic dysfunction. No further workup or evaluation indicated.  History of hypertension: Hypotensive in the ER.  Resume home medications now that patient's blood pressure has improved/if not borderline elevated  Dementia, unspecified, without noted mood disorder  Continue home medications including citalopram, alprazolam, trazodone Patient doing very well out of restraints off telemetry, no longer requiring tele-sitter/monitoring/mittens  Chronic microcytic anemia: Fairly stable.  DVT prophylaxis: Heparin subcu Code Status: Full code Family Communication: Discussed with daughter May Disposition Plan: SNF pending further discussion between patient's daughter and case management as well as insurance approval.  Daughter declined previous bed offer made yesterday - awaiting further bed offers currently. Case management continues to work diligently with the daughter May.  Consultants:   None  Procedures:   None  Antimicrobials:   Vancomycin, cefepime and Flagyl 1 dose in the ER. Not continued on the floor  Subjective: No acute issues or events overnight, patient awake alert to person and general situation this morning, not alert to time or place.  Clines chest pain, shortness of breath,  nausea, vomiting, diarrhea, constipation, headache, fevers, chills. Objective: Vitals:   07/17/18 0847 07/17/18 1130 07/17/18 2051 07/18/18 0541  BP: (!) 146/77 (!) 146/66 (!) 123/59 127/71  Pulse: 73 76 62 (!) 57  Resp: 16 18 16 16   Temp: 99.3 F (37.4 C) 98.5 F (36.9 C) 97.6 F (36.4 C) 98.9 F (37.2 C)  TempSrc: Oral Oral    SpO2: 98% 100% 100% 100%  Weight:    50.1 kg  Height:        Intake/Output Summary (Last 24 hours) at 07/18/2018 1210 Last data filed at 07/18/2018 1000 Gross per 24 hour  Intake 840 ml  Output 200 ml  Net 640 ml   Filed Weights   07/16/18 0612 07/17/18 0428 07/18/18 0541  Weight: 47.3 kg 49.4 kg 50.1 kg    Examination:  General exam: Appears calm and comfortable, on room air. Respiratory system: Clear to auscultation. Respiratory effort normal. Cardiovascular system: S1 & S2 heard, RRR. No JVD, murmurs, rubs, gallops or clicks. No pedal edema. Gastrointestinal system: Abdomen is nondistended, soft and nontender. No organomegaly or masses felt. Normal bowel sounds heard. Central nervous system: Awake, alert, oriented to herself only.  No focal neurological deficits. Extremities: Symmetric 4/5 bilaterally Skin: No rashes, lesions or ulcers Psychiatry: Unable to properly assess given advanced dementia/current mental status  Data Reviewed: I have personally reviewed following labs and imaging studies  CBC: Recent Labs  Lab 07/13/18 1154 07/13/18 1821 07/14/18 0322 07/16/18 0548 07/17/18 0249 07/18/18 0305  WBC 8.8  --  8.4 7.2  10.5 13.8*  NEUTROABS 7.1  --   --   --   --   --   HGB 9.9* 10.5* 9.1* 9.5* 9.3* 8.7*  HCT 32.1* 31.0* 28.0* 30.3* 29.2* 26.3*  MCV 73.0*  --  69.7* 70.6* 70.0* 69.6*  PLT 125*  --  103* 116* 134* 998*   Basic Metabolic Panel: Recent Labs  Lab 07/14/18 0322 07/15/18 0516 07/16/18 0548 07/17/18 0249 07/18/18 0305  NA 141 141 141 139 140  K 3.8 3.4* 3.3* 3.0* 3.0*  CL 106 107 110 102 104  CO2 21* 24 23 26 27    GLUCOSE 92 80 89 122* 143*  BUN 61* 37* 27* 28* 35*  CREATININE 2.53* 1.53* 1.09* 1.05* 1.08*  CALCIUM 9.2 9.3 8.4* 8.8* 8.7*   GFR: Estimated Creatinine Clearance: 24.6 mL/min (A) (by C-G formula based on SCr of 1.08 mg/dL (H)). Liver Function Tests: Recent Labs  Lab 07/13/18 1154 07/14/18 0322  AST 160* 61*  ALT 100* 63*  ALKPHOS 71 64  BILITOT 0.5 0.7  PROT 6.6 6.1*  ALBUMIN 3.2* 2.9*   No results for input(s): LIPASE, AMYLASE in the last 168 hours. No results for input(s): AMMONIA in the last 168 hours. Coagulation Profile: No results for input(s): INR, PROTIME in the last 168 hours. Cardiac Enzymes: Recent Labs  Lab 07/13/18 1154 07/13/18 2101 07/14/18 0322 07/14/18 0744  CKTOTAL  --   --   --  257*  CKMB  --   --   --  6.2*  TROPONINI 0.08* 0.93* 0.88*  --    BNP (last 3 results) No results for input(s): PROBNP in the last 8760 hours. HbA1C: No results for input(s): HGBA1C in the last 72 hours. CBG: No results for input(s): GLUCAP in the last 168 hours. Lipid Profile: No results for input(s): CHOL, HDL, LDLCALC, TRIG, CHOLHDL, LDLDIRECT in the last 72 hours. Thyroid Function Tests: No results for input(s): TSH, T4TOTAL, FREET4, T3FREE, THYROIDAB in the last 72 hours. Anemia Panel: No results for input(s): VITAMINB12, FOLATE, FERRITIN, TIBC, IRON, RETICCTPCT in the last 72 hours. Sepsis Labs: Recent Labs  Lab 07/13/18 1404 07/13/18 1817 07/14/18 0744  LATICACIDVEN 4.3* 2.2* 1.3    Recent Results (from the past 240 hour(s))  Blood culture (routine x 2)     Status: None (Preliminary result)   Collection Time: 07/13/18  1:58 PM  Result Value Ref Range Status   Specimen Description BLOOD RIGHT ANTECUBITAL  Final   Special Requests   Final    BOTTLES DRAWN AEROBIC AND ANAEROBIC Blood Culture adequate volume   Culture   Final    NO GROWTH 4 DAYS Performed at [facility], [place] [address], [place], [place] [address]    Report Status PENDING   Incomplete  Blood culture (routine x 2)     Status: None (Preliminary result)   Collection Time: 07/13/18  2:01 PM  Result Value Ref Range Status   Specimen Description BLOOD RIGHT HAND  Final   Special Requests   Final    BOTTLES DRAWN AEROBIC AND ANAEROBIC Blood Culture results may not be optimal due to an inadequate volume of blood received in culture bottles   Culture   Final    NO GROWTH 4 DAYS Performed at [facility], [place] [address] [place], [place] [address]    Report Status PENDING  Incomplete  SARS Coronavirus 2 [facility] order, Performed in [facility] hospital lab)     Status: None   Collection  Time: 07/13/18  2:19 PM  Result Value Ref Range Status   SARS Coronavirus 2 NEGATIVE NEGATIVE Final    Comment: (NOTE) If result is NEGATIVE SARS-CoV-2 target nucleic acids are NOT DETECTED. The SARS-CoV-2 RNA is generally detectable in upper and lower  respiratory specimens during the acute phase of infection. The lowest  concentration of SARS-CoV-2 viral copies this assay can detect is 250  copies / mL. A negative result does not preclude SARS-CoV-2 infection  and should not be used as the sole basis for treatment or other  patient management decisions.  A negative result may occur with  improper specimen collection / handling, submission of specimen other  than nasopharyngeal swab, presence of viral mutation(s) within the  areas targeted by this assay, and inadequate number of viral copies  (<250 copies / mL). A negative result must be combined with clinical  observations, patient history, and epidemiological information. If result is POSITIVE SARS-CoV-2 target nucleic acids are DETECTED. The SARS-CoV-2 RNA is generally detectable in upper and lower  respiratory specimens dur ing the acute phase of infection.  Positive  results are indicative of active infection with SARS-CoV-2.  Clinical  correlation with patient history and other diagnostic information is   necessary to determine patient infection status.  Positive results do  not rule out bacterial infection or co-infection with other viruses. If result is PRESUMPTIVE POSTIVE SARS-CoV-2 nucleic acids MAY BE PRESENT.   A presumptive positive result was obtained on the submitted specimen  and confirmed on repeat testing.  While 2019 novel coronavirus  (SARS-CoV-2) nucleic acids may be present in the submitted sample  additional confirmatory testing may be necessary for epidemiological  and / or clinical management purposes  to differentiate between  SARS-CoV-2 and other Sarbecovirus currently known to infect humans.  If clinically indicated additional testing with an alternate test  methodology [phone]) is advised. The SARS-CoV-2 RNA is generally  detectable in upper and lower respiratory sp ecimens during the acute  phase of infection. The expected result is Negative. Fact Sheet for Patients:  StrictlyIdeas.no Fact Sheet for Healthcare Providers: BankingDealers.co.za This test is not yet approved or cleared by the [place] FDA and has been authorized for detection and/or diagnosis of SARS-CoV-2 by FDA under an Emergency Use Authorization (EUA).  This EUA will remain in effect (meaning this test can be used) for the duration of the COVID-19 declaration under Section 564(b)(1) of the Act, 21 U.S.C. section 360bbb-3(b)(1), unless the authorization is terminated or revoked sooner. Performed at [facility], [place] [address], [place], [place] [address]   Urine Culture     Status: None   Collection Time: 07/13/18  2:48 PM  Result Value Ref Range Status   Specimen Description URINE, RANDOM  Final   Special Requests NONE  Final   Culture   Final    NO GROWTH Performed at [facility], [place] [address], [place], [place] [address]    Report Status 07/14/2018 FINAL  Final     Radiology Studies: No results found.  Scheduled  Meds: . irbesartan  300 mg Oral Daily   And  . amLODipine  5 mg Oral Daily  . bisacodyl  10 mg Rectal Once  . citalopram  20 mg Oral Daily  . cycloSPORINE  1 drop Both Eyes Q12H  . feeding supplement  1 Container Oral BID BM  . feeding supplement (PRO-STAT SUGAR FREE 64)  30 mL Oral BID  . heparin  5,000 Units  Subcutaneous Q8H  . hydrochlorothiazide  25 mg Oral Daily  . latanoprost  1 drop Both Eyes QHS  . multivitamin with minerals  1 tablet Oral Daily  . senna  1 tablet Oral BID  . vitamin C  250 mg Oral Daily   Continuous Infusions:    LOS: 4 days   Time spent: 37 minutes.  Little Ishikawa, MD [facility] Pager [phone]  If 7PM-7AM, please contact night-coverage [email or web address] Password [email or web address] 07/18/2018, 12:10 PM

## 2018-07-19 DIAGNOSIS — Z515 Encounter for palliative care: Secondary | ICD-10-CM

## 2018-07-19 DIAGNOSIS — Z7189 Other specified counseling: Secondary | ICD-10-CM

## 2018-07-19 DIAGNOSIS — N170 Acute kidney failure with tubular necrosis: Principal | ICD-10-CM

## 2018-07-19 DIAGNOSIS — R531 Weakness: Secondary | ICD-10-CM

## 2018-07-19 LAB — CULTURE, BLOOD (ROUTINE X 2)
Culture: NO GROWTH
Culture: NO GROWTH
Special Requests: ADEQUATE

## 2018-07-19 LAB — CBC
HCT: 26.3 % — ABNORMAL LOW (ref 36.0–46.0)
Hemoglobin: 8.4 g/dL — ABNORMAL LOW (ref 12.0–15.0)
MCH: 22.2 pg — ABNORMAL LOW (ref 26.0–34.0)
MCHC: 31.9 g/dL (ref 30.0–36.0)
MCV: 69.6 fL — ABNORMAL LOW (ref 80.0–100.0)
Platelets: 126 10*3/uL — ABNORMAL LOW (ref 150–400)
RBC: 3.78 MIL/uL — ABNORMAL LOW (ref 3.87–5.11)
RDW: 13.9 % (ref 11.5–15.5)
WBC: 9.4 10*3/uL (ref 4.0–10.5)
nRBC: 0 % (ref 0.0–0.2)

## 2018-07-19 LAB — SARS CORONAVIRUS 2 BY RT PCR (HOSPITAL ORDER, PERFORMED IN ~~LOC~~ HOSPITAL LAB): SARS Coronavirus 2: NEGATIVE

## 2018-07-19 NOTE — TOC Progression Note (Signed)
Transition of Care [facility]) - Progression Note    Patient Details  Name: Brianna Hunter MRN: [id number] Date of Birth: [birth year]  Transition of Care [facility]) CM/SW [place], [place] Phone Number: 07/19/2018, 1:26 PM  Clinical Narrative:    CSW spoke with the patient's daughter, May and she has accepted bed offer for [facility]. CSW contacted SNF [name]) and advised family will accept bed offer.    Thurmond Butts, MSW, [facility] Clinical Social Worker [phone]    Expected Discharge Plan: [place] Barriers to Discharge: Continued Medical Work up  Expected Discharge Plan and Services Expected Discharge Plan: [place] arrangements for the past 2 months: Apartment                                       Social Determinants of Health (SDOH) Interventions    Readmission Risk Interventions No flowsheet data found.

## 2018-07-19 NOTE — Progress Notes (Signed)
Contacted SW to get an update. Per SW patient is still waiting on insurance approval to SNF.

## 2018-07-19 NOTE — TOC Progression Note (Signed)
Transition of Care [facility]) - Progression Note    Patient Details  Name: Naara Kelty MRN: [id number] Date of Birth: 09-18-1928  Transition of Care [facility]) CM/SW [place], [place] Phone Number: 07/19/2018, 10:31 AM  Clinical Narrative:    CSW called Patient's daughter to discuss placement options. CSW left voice message to return call.  Thurmond Butts, MSW, [facility] Clinical Social Worker [phone]    Expected Discharge Plan: [place] Barriers to Discharge: Continued Medical Work up  Expected Discharge Plan and Services Expected Discharge Plan: [place] arrangements for the past 2 months: Apartment                                       Social Determinants of Health (SDOH) Interventions    Readmission Risk Interventions No flowsheet data found.

## 2018-07-19 NOTE — Consult Note (Signed)
Consultation Note Date: 07/19/2018   Patient Name: Brianna Hunter  DOB: [birth year]  MRN: [id number]  Age / Sex: 85 y.o., female  PCP: [name], MD Referring Physician: Aline August, MD  Reason for Consultation: Establishing goals of care and Psychosocial/spiritual support  HPI/Patient Profile: 85 y.o. female   admitted on 07/13/2018 with past medical history of left breast DCIS status post lumpectomy treated with tamoxifen, hypertension, hyperlipidemia, anxiety, valvular heart disease presenting with frequent falls at home.  Patient currently living at home alone with good support from daughter who lives locally. According to daughter patient has continued to physically and functionally decline over the past many months and it is getting more difficult for her to remain living at home alone.  Plan for discharged is SNF for rehab with palliative care services  Patient and family face treatment option decisions, advanced directive decisions and anticipatory care needs.   Clinical Assessment and Goals of Care:   This NP Brianna Hunter reviewed medical records, received report from team, assessed the patient and then meet at the patient's bedside and spoke by phone with   Daughter/Brianna Hunter  to discuss diagnosis, prognosis, GOC, EOL wishes disposition and options.  Concept of Hospice and Palliative Care were discussed  A discussion was had today regarding advanced directives.  Concepts specific to code status, artifical feeding and hydration, continued IV antibiotics and rehospitalization was had.  The difference between a aggressive medical intervention path  and a palliative comfort care path for this patient at this time was had.  Values and goals of care important to patient and family were attempted to be elicited.  Natural trajectory and expectations at EOL were discussed.  Questions and concerns  addressed.   Family encouraged to call with questions or concerns.    PMT will continue to support holistically.   No documented healthcare power of attorney, the patient has 2 children, a son and a daughter.   SUMMARY OF RECOMMENDATIONS    Code Status/Advance Care Planning:  Full code -encouraged patient/family to consider DNR/DNI status knowing poor outcomes in patients with similar comorbidities.  Patient is open to all offered and available medical interventions to prolong life.    Palliative Prophylaxis:   Bowel Regimen, Delirium Protocol, Frequent Pain Assessment and Oral Care  Additional Recommendations (Limitations, Scope, Preferences):  Full Scope Treatment  Psycho-social/Spiritual:   Desire for further Chaplaincy support:yes  Created space and opportunity for patient and her daughter to explore their thoughts and feelings regarding the current medical situation.  Both verbalized an understanding of more increased needs if the patient is able to continue to live alone.  Prognosis:   Unable to determine  Discharge Planning: [facility] for rehab with Palliative care service follow-up      Primary Diagnoses: Present on Admission: . HYPERTENSION, BENIGN . Lactic acidosis . AKI (acute kidney injury) ([place]) . High anion gap metabolic acidosis . Elevated liver enzymes . Microcytic anemia . Thrombocytopenia ([place]) . Elevated troponin . Atrial fibrillation with RVR ([place]) . Acute  kidney injury (AKI) with acute tubular necrosis (ATN) ([facility])   I have reviewed the medical record, interviewed the patient and family, and examined the patient. The following aspects are pertinent.  Past Medical History:  Diagnosis Date  . Anxiety   . Arthritis    celebrex use off & on for arthritis- hands, back   . Back pain   . Bifascicular block   . Breast cancer ([place])    a. DCIS s/p R lumpectomy  . Hyperlipidemia   . Hypertension   . Palpitations    a. holter  (7/11):  isolated PVCs and PACs  . Rheumatism   . Valvular heart disease    a. Echo (7/11):  EF 55-60%, Gr 1 DD, mild-mod AI, mild-mod MR, mod PI, PASP 30;  b. Echo (09/2010):  Mild LVH, EF 60%, Gr 1 DD, mild to mod AI, mild MR, mild TR, mild to mod PI, PASP 36.;   c.  Echo (05/2013):  EF 60-65%, no RWMA, mild AI, mild to mod PI    Social History   Socioeconomic History  . Marital status: Widowed    Spouse name: Not on file  . Number of children: Not on file  . Years of education: Not on file  . Highest education level: Not on file  Occupational History  . Occupation: Retired  Scientific laboratory technician  . Financial resource strain: Not on file  . Food insecurity:    Worry: Not on file    Inability: Not on file  . Transportation needs:    Medical: Not on file    Non-medical: Not on file  Tobacco Use  . Smoking status: Never Smoker  . Smokeless tobacco: Never Used  Substance and Sexual Activity  . Alcohol use: No  . Drug use: No  . Sexual activity: Not Currently  Lifestyle  . Physical activity:    Days per week: Not on file    Minutes per session: Not on file  . Stress: Not on file  Relationships  . Social connections:    Talks on phone: Not on file    Gets together: Not on file    Attends religious service: Not on file    Active member of club or organization: Not on file    Attends meetings of clubs or organizations: Not on file    Relationship status: Not on file  Other Topics Concern  . Not on file  Social History Narrative   Widowed   Lives in assisted living and is involved with taking care of her granddaughter   Family History  Problem Relation Age of Onset  . Diabetes Mother   . Other Mother        Bleeding disorder  . Diabetes Father   . Other Father        Bleeding disorder   Scheduled Meds: . irbesartan  300 mg Oral Daily   And  . amLODipine  5 mg Oral Daily  . bisacodyl  10 mg Rectal Once  . citalopram  20 mg Oral Daily  . cycloSPORINE  1 drop Both Eyes Q12H   . feeding supplement  1 Container Oral BID BM  . feeding supplement (PRO-STAT SUGAR FREE 64)  30 mL Oral BID  . heparin  5,000 Units Subcutaneous Q8H  . hydrochlorothiazide  25 mg Oral Daily  . latanoprost  1 drop Both Eyes QHS  . multivitamin with minerals  1 tablet Oral Daily  . senna  1 tablet Oral BID  . vitamin  C  250 mg Oral Daily   Continuous Infusions: PRN Meds:.acetaminophen **OR** acetaminophen, ALPRAZolam, polyethylene glycol, traZODone Medications Prior to Admission:  Prior to Admission medications   Medication Sig Start Date End Date Taking? Authorizing Provider  Ascorbic Acid (VITAMIN C PO) Take 1 tablet by mouth daily.   Yes [provider]  bimatoprost (LUMIGAN) 0.01 % SOLN Place 1 drop into both eyes at bedtime.   Yes [provider]  celecoxib (CELEBREX) 200 MG capsule Take 200 mg by mouth at bedtime as needed (pain).    Yes [provider]  citalopram (CELEXA) 20 MG tablet Take 20 mg by mouth daily.   Yes [provider]  cycloSPORINE (RESTASIS) 0.05 % ophthalmic emulsion Place 1 drop into both eyes every 12 (twelve) hours.   Yes [provider]  Olmesartan-amLODIPine-HCTZ 40-5-25 MG TABS Take 1 tablet by mouth daily.   Yes [provider]  feeding supplement, RESOURCE BREEZE, (RESOURCE BREEZE) LIQD Take 1 Container by mouth 2 (two) times daily between meals. Patient not taking: Reported on 07/13/2018 07/15/13   Modena Jansky, MD   Allergies  Allergen Reactions  . Milk-Related Compounds Other (See Comments)    Frequent bowel movements   . Augmentin [Amoxicillin-Pot Clavulanate] Diarrhea   Review of Systems  Constitutional: Positive for unexpected weight change.  Musculoskeletal:       Reports frequent falls  Neurological: Positive for weakness.    Physical Exam Constitutional:      Appearance: She is cachectic.  Cardiovascular:     Rate and Rhythm: Normal rate and regular rhythm.     Heart sounds:  Normal heart sounds.  Pulmonary:     Effort: Pulmonary effort is normal.     Breath sounds: Normal breath sounds.  Musculoskeletal:     Comments: Neurolysed weakness and muscle atrophy  Skin:    General: Skin is warm and dry.  Neurological:     Mental Status: She is alert.     Vital Signs: BP 120/62 (BP Location: Right Arm)   Pulse (!) 58   Temp 98.9 F (37.2 C) (Oral)   Resp 18   Ht 4\' 9"  (1.448 m)   Wt 49.1 kg   SpO2 100%   BMI 23.41 kg/m  Pain Scale: 0-10   Pain Score: 0-No pain   SpO2: SpO2: 100 % O2 Device:SpO2: 100 % O2 Flow Rate: .   IO: Intake/output summary:   Intake/Output Summary (Last 24 hours) at 07/19/2018 1131 Last data filed at 07/19/2018 3151 Gross per 24 hour  Intake 1320 ml  Output 1000 ml  Net 320 ml    LBM: Last BM Date: 07/18/18 Baseline Weight: Weight: 52.6 kg Most recent weight: Weight: 49.1 kg     Palliative Assessment/Data: 40 %   Discussed with Dr. Starla Link  Time In: 1320 Time Out: 1430 Time Total: 70 minutes Greater than 50%  of this time was spent counseling and coordinating care related to the above assessment and plan.  Signed by: Brianna Lessen, NP   Please contact Palliative Medicine Team phone at [phone] for questions and concerns.  For individual provider: See Shea Evans

## 2018-07-19 NOTE — Progress Notes (Signed)
Patient ID: Brianna Hunter, female   DOB: February 26, 1929, 85 y.o.   MRN: [id number]  PROGRESS NOTE    Brianna Hunter  ZDG:[id number] DOB: Aug 02, 1928 DOA: 07/13/2018 PCP: [name], MD   Brief Narrative:  85 year old female with history of left breast DCIS status post lumpectomy treated with tamoxifen, hypertension, hyperlipidemia, dementia, anxiety, valvular heart disease and bifascicular block presented with fall at home.  She was initially hypotensive and had A. fib with RVR.  COVID-19 was negative.  Chest x-ray was negative for any acute finding.  CT head was negative.  She was started on broad-spectrum antibiotics.  Work-up was negative for any infection, antibiotics were subsequently discontinued.  Her heart rate improved subsequently.  PT is now recommending SNF placement.  Assessment & Plan:  Fall at home, most likely mechanical fall -CT head was negative for any acute intracranial abnormality. -Fall precautions -SNF placement once bed is available as per PT recommendations. -Social worker following.  Patient is medically stable for discharge  New onset paroxysmal A. fib with RVR -Resolved.  Currently normal sinus rhythm -Provoked in the setting of stress, fall, dehydration and acute kidney injury -Not a candidate for anticoagulation because of advanced dementia and frequent falls -Rate controlled.  Acute kidney injury: Present on admission -Resolved.  No labs today -Off IV fluids.  Elevated CK levels:  -Only minimally elevated: Improved with IV fluids  Lactic acidosis -Resolved -Initially started on broad-spectrum antibiotics with concern for infection.  But after work-up, there was no evidence of infection.  Antibiotics have been discontinued.  Currently afebrile  Minimally elevated LFTs -Improving  Elevated troponin -Multifactorial in the setting of acute kidney injury, RVR, dehydration and lactic acidosis -No ischemic change in EKG -Troponins did not trend up  -Echo showed EF of 55 to 60% with mild diastolic dysfunction.  No further work-up or evaluation indicated.  No chest pains.  Hypertension -Blood pressure stable.  Continue current medications  Dementia with probable anxiety/depression-continue current regimen.  Monitor mental status.  Fall precautions -Patient remains full code.  Will request palliative care evaluation for goals of care discussion.  Chronic microcytic anemia -Stable  Thrombocytopenia -Questionable cause.  No signs of bleeding   DVT prophylaxis: Heparin subcutaneous Code Status: Full Family Communication: None at bedside Disposition Plan: SNF once bed is available.  Patient is medically stable for discharge  Consultants: None  Procedures:  Echo IMPRESSIONS    1. The left ventricle has normal systolic function, with an ejection fraction of 55-60%. The cavity size was normal. Left ventricular diastolic Doppler parameters are consistent with impaired relaxation.  2. The right ventricle has normal systolc function. The cavity was mildly enlarged.  3. Right atrial size was moderately dilated.  4. Small pericardial effusion.  5. The mitral valve is abnormal. Mild thickening of the mitral valve leaflet.  6. The tricuspid valve was grossly normal. Tricuspid valve regurgitation is moderate-severe.  7. The aortic valve is tricuspid Mild calcification of the aortic valve. Aortic valve regurgitation is mild by color flow Doppler. No stenosis of the aortic valve.  8. Normal LV function; mild diastolic dysfunction; mild AI; moderate RAE; mild RVE; moderate to severe TR; moderate pulmonary hypertension.  Antimicrobials:  Vancomycin, cefepime and Flagyl 1 dose in the ER  Subjective: Patient seen and examined at bedside.  She is awake, pleasantly confused.  Does not answer much questions.  No overnight fever or vomiting reported by nursing staff.  Objective: Vitals:   07/18/18 0541 07/18/18 2112 07/19/18 0702 07/19/18  0853  BP: 127/71 127/66 (!) 123/56 129/62  Pulse: (!) 57 (!) 58 61 60  Resp: 16 16 16 18   Temp: 98.9 F (37.2 C) 98.9 F (37.2 C) 98.7 F (37.1 C)   TempSrc:  Oral Oral   SpO2: 100% 100% 98% 100%  Weight: 50.1 kg  49.1 kg   Height:        Intake/Output Summary (Last 24 hours) at 07/19/2018 1030 Last data filed at 07/19/2018 0959 Gross per 24 hour  Intake 1320 ml  Output 1000 ml  Net 320 ml   Filed Weights   07/17/18 0428 07/18/18 0541 07/19/18 0702  Weight: 49.4 kg 50.1 kg 49.1 kg    Examination:  General exam: Appears calm and comfortable.  No distress.  Elderly female lying in bed.  Confused Respiratory system: Bilateral decreased breath sounds at bases with some scattered crackles.  No wheezing Cardiovascular system: S1 & S2 heard, Rate controlled Gastrointestinal system: Abdomen is nondistended, soft and nontender. Normal bowel sounds heard. Extremities: No cyanosis, clubbing, edema    Data Reviewed: I have personally reviewed following labs and imaging studies  CBC: Recent Labs  Lab 07/13/18 1154  07/14/18 0322 07/16/18 0548 07/17/18 0249 07/18/18 0305 07/19/18 0524  WBC 8.8  --  8.4 7.2 10.5 13.8* 9.4  NEUTROABS 7.1  --   --   --   --   --   --   HGB 9.9*   < > 9.1* 9.5* 9.3* 8.7* 8.4*  HCT 32.1*   < > 28.0* 30.3* 29.2* 26.3* 26.3*  MCV 73.0*  --  69.7* 70.6* 70.0* 69.6* 69.6*  PLT 125*  --  103* 116* 134* 132* 126*   < > = values in this interval not displayed.   Basic Metabolic Panel: Recent Labs  Lab 07/14/18 0322 07/15/18 0516 07/16/18 0548 07/17/18 0249 07/18/18 0305  NA 141 141 141 139 140  K 3.8 3.4* 3.3* 3.0* 3.0*  CL 106 107 110 102 104  CO2 21* 24 23 26 27   GLUCOSE 92 80 89 122* 143*  BUN 61* 37* 27* 28* 35*  CREATININE 2.53* 1.53* 1.09* 1.05* 1.08*  CALCIUM 9.2 9.3 8.4* 8.8* 8.7*   GFR: Estimated Creatinine Clearance: 24.3 mL/min (A) (by C-G formula based on SCr of 1.08 mg/dL (H)). Liver Function Tests: Recent Labs  Lab  07/13/18 1154 07/14/18 0322  AST 160* 61*  ALT 100* 63*  ALKPHOS 71 64  BILITOT 0.5 0.7  PROT 6.6 6.1*  ALBUMIN 3.2* 2.9*   No results for input(s): LIPASE, AMYLASE in the last 168 hours. No results for input(s): AMMONIA in the last 168 hours. Coagulation Profile: No results for input(s): INR, PROTIME in the last 168 hours. Cardiac Enzymes: Recent Labs  Lab 07/13/18 1154 07/13/18 2101 07/14/18 0322 07/14/18 0744  CKTOTAL  --   --   --  257*  CKMB  --   --   --  6.2*  TROPONINI 0.08* 0.93* 0.88*  --    BNP (last 3 results) No results for input(s): PROBNP in the last 8760 hours. HbA1C: No results for input(s): HGBA1C in the last 72 hours. CBG: No results for input(s): GLUCAP in the last 168 hours. Lipid Profile: No results for input(s): CHOL, HDL, LDLCALC, TRIG, CHOLHDL, LDLDIRECT in the last 72 hours. Thyroid Function Tests: No results for input(s): TSH, T4TOTAL, FREET4, T3FREE, THYROIDAB in the last 72 hours. Anemia Panel: No results for input(s): VITAMINB12, FOLATE, FERRITIN, TIBC, IRON, RETICCTPCT in the last 72  hours. Sepsis Labs: Recent Labs  Lab 07/13/18 1404 07/13/18 1817 07/14/18 0744  LATICACIDVEN 4.3* 2.2* 1.3    Recent Results (from the past 240 hour(s))  Blood culture (routine x 2)     Status: None   Collection Time: 07/13/18  1:58 PM  Result Value Ref Range Status   Specimen Description BLOOD RIGHT ANTECUBITAL  Final   Special Requests   Final    BOTTLES DRAWN AEROBIC AND ANAEROBIC Blood Culture adequate volume   Culture   Final    NO GROWTH 6 DAYS Performed at [facility], [address] [place], [place] [address]    Report Status 07/19/2018 FINAL  Final  Blood culture (routine x 2)     Status: None   Collection Time: 07/13/18  2:01 PM  Result Value Ref Range Status   Specimen Description BLOOD RIGHT HAND  Final   Special Requests   Final    BOTTLES DRAWN AEROBIC AND ANAEROBIC Blood Culture results may not be optimal due to an  inadequate volume of blood received in culture bottles   Culture   Final    NO GROWTH 6 DAYS Performed at [facility], [place] [address], [place], [place] [address]    Report Status 07/19/2018 FINAL  Final  SARS Coronavirus 2 [facility] order, Performed in [place] hospital lab)     Status: None   Collection Time: 07/13/18  2:19 PM  Result Value Ref Range Status   SARS Coronavirus 2 NEGATIVE NEGATIVE Final    Comment: (NOTE) If result is NEGATIVE SARS-CoV-2 target nucleic acids are NOT DETECTED. The SARS-CoV-2 RNA is generally detectable in upper and lower  respiratory specimens during the acute phase of infection. The lowest  concentration of SARS-CoV-2 viral copies this assay can detect is 250  copies / mL. A negative result does not preclude SARS-CoV-2 infection  and should not be used as the sole basis for treatment or other  patient management decisions.  A negative result may occur with  improper specimen collection / handling, submission of specimen other  than nasopharyngeal swab, presence of viral mutation(s) within the  areas targeted by this assay, and inadequate number of viral copies  (<250 copies / mL). A negative result must be combined with clinical  observations, patient history, and epidemiological information. If result is POSITIVE SARS-CoV-2 target nucleic acids are DETECTED. The SARS-CoV-2 RNA is generally detectable in upper and lower  respiratory specimens dur ing the acute phase of infection.  Positive  results are indicative of active infection with SARS-CoV-2.  Clinical  correlation with patient history and other diagnostic information is  necessary to determine patient infection status.  Positive results do  not rule out bacterial infection or co-infection with other viruses. If result is PRESUMPTIVE POSTIVE SARS-CoV-2 nucleic acids MAY BE PRESENT.   A presumptive positive result was obtained on the submitted specimen  and confirmed on repeat  testing.  While 2019 novel coronavirus  (SARS-CoV-2) nucleic acids may be present in the submitted sample  additional confirmatory testing may be necessary for epidemiological  and / or clinical management purposes  to differentiate between  SARS-CoV-2 and other Sarbecovirus currently known to infect humans.  If clinically indicated additional testing with an alternate test  methodology [phone]) is advised. The SARS-CoV-2 RNA is generally  detectable in upper and lower respiratory sp ecimens during the acute  phase of infection. The expected result is Negative. Fact Sheet for Patients:  StrictlyIdeas.no Fact Sheet for  Healthcare Providers: BankingDealers.co.za This test is not yet approved or cleared by the [place] and has been authorized for detection and/or diagnosis of SARS-CoV-2 by FDA under an Emergency Use Authorization (EUA).  This EUA will remain in effect (meaning this test can be used) for the duration of the COVID-19 declaration under Section 564(b)(1) of the Act, 21 U.S.C. section 360bbb-3(b)(1), unless the authorization is terminated or revoked sooner. Performed at [facility], [place] [address] [place], [place] [address]   Urine Culture     Status: None   Collection Time: 07/13/18  2:48 PM  Result Value Ref Range Status   Specimen Description URINE, RANDOM  Final   Special Requests NONE  Final   Culture   Final    NO GROWTH Performed at [facility], [place] [address], [place], [place] [address]    Report Status 07/14/2018 FINAL  Final         Radiology Studies: No results found.      Scheduled Meds: . irbesartan  300 mg Oral Daily   And  . amLODipine  5 mg Oral Daily  . bisacodyl  10 mg Rectal Once  . citalopram  20 mg Oral Daily  . cycloSPORINE  1 drop Both Eyes Q12H  . feeding supplement  1 Container Oral BID BM  . feeding supplement (PRO-STAT SUGAR FREE 64)  30 mL Oral BID  .  heparin  5,000 Units Subcutaneous Q8H  . hydrochlorothiazide  25 mg Oral Daily  . latanoprost  1 drop Both Eyes QHS  . multivitamin with minerals  1 tablet Oral Daily  . senna  1 tablet Oral BID  . vitamin C  250 mg Oral Daily   Continuous Infusions:   LOS: 5 days        Aline August, MD  [facility] 07/19/2018, 10:30 AM

## 2018-07-20 DIAGNOSIS — E43 Unspecified severe protein-calorie malnutrition: Secondary | ICD-10-CM | POA: Diagnosis not present

## 2018-07-20 DIAGNOSIS — M6281 Muscle weakness (generalized): Secondary | ICD-10-CM | POA: Diagnosis not present

## 2018-07-20 DIAGNOSIS — Z515 Encounter for palliative care: Secondary | ICD-10-CM | POA: Diagnosis not present

## 2018-07-20 DIAGNOSIS — R531 Weakness: Secondary | ICD-10-CM

## 2018-07-20 DIAGNOSIS — F331 Major depressive disorder, recurrent, moderate: Secondary | ICD-10-CM | POA: Diagnosis not present

## 2018-07-20 DIAGNOSIS — E612 Magnesium deficiency: Secondary | ICD-10-CM | POA: Diagnosis not present

## 2018-07-20 DIAGNOSIS — R4182 Altered mental status, unspecified: Secondary | ICD-10-CM | POA: Diagnosis not present

## 2018-07-20 DIAGNOSIS — W19XXXA Unspecified fall, initial encounter: Secondary | ICD-10-CM | POA: Diagnosis not present

## 2018-07-20 DIAGNOSIS — I2699 Other pulmonary embolism without acute cor pulmonale: Secondary | ICD-10-CM | POA: Diagnosis not present

## 2018-07-20 DIAGNOSIS — N179 Acute kidney failure, unspecified: Secondary | ICD-10-CM | POA: Diagnosis not present

## 2018-07-20 DIAGNOSIS — R410 Disorientation, unspecified: Secondary | ICD-10-CM | POA: Diagnosis not present

## 2018-07-20 DIAGNOSIS — I10 Essential (primary) hypertension: Secondary | ICD-10-CM | POA: Diagnosis not present

## 2018-07-20 DIAGNOSIS — G459 Transient cerebral ischemic attack, unspecified: Secondary | ICD-10-CM | POA: Diagnosis not present

## 2018-07-20 DIAGNOSIS — E876 Hypokalemia: Secondary | ICD-10-CM | POA: Diagnosis not present

## 2018-07-20 DIAGNOSIS — I4891 Unspecified atrial fibrillation: Secondary | ICD-10-CM | POA: Diagnosis not present

## 2018-07-20 DIAGNOSIS — D508 Other iron deficiency anemias: Secondary | ICD-10-CM | POA: Diagnosis not present

## 2018-07-20 DIAGNOSIS — F0390 Unspecified dementia without behavioral disturbance: Secondary | ICD-10-CM | POA: Diagnosis not present

## 2018-07-20 DIAGNOSIS — Z743 Need for continuous supervision: Secondary | ICD-10-CM | POA: Diagnosis not present

## 2018-07-20 DIAGNOSIS — D696 Thrombocytopenia, unspecified: Secondary | ICD-10-CM | POA: Diagnosis not present

## 2018-07-20 DIAGNOSIS — R279 Unspecified lack of coordination: Secondary | ICD-10-CM | POA: Diagnosis not present

## 2018-07-20 DIAGNOSIS — D500 Iron deficiency anemia secondary to blood loss (chronic): Secondary | ICD-10-CM | POA: Diagnosis not present

## 2018-07-20 DIAGNOSIS — Y92009 Unspecified place in unspecified non-institutional (private) residence as the place of occurrence of the external cause: Secondary | ICD-10-CM | POA: Diagnosis not present

## 2018-07-20 DIAGNOSIS — N170 Acute kidney failure with tubular necrosis: Secondary | ICD-10-CM | POA: Diagnosis not present

## 2018-07-20 DIAGNOSIS — R748 Abnormal levels of other serum enzymes: Secondary | ICD-10-CM | POA: Diagnosis not present

## 2018-07-20 DIAGNOSIS — R7989 Other specified abnormal findings of blood chemistry: Secondary | ICD-10-CM | POA: Diagnosis not present

## 2018-07-20 DIAGNOSIS — F0391 Unspecified dementia with behavioral disturbance: Secondary | ICD-10-CM | POA: Diagnosis not present

## 2018-07-20 DIAGNOSIS — M1990 Unspecified osteoarthritis, unspecified site: Secondary | ICD-10-CM | POA: Diagnosis not present

## 2018-07-20 LAB — BASIC METABOLIC PANEL
Anion gap: 11 (ref 5–15)
BUN: 24 mg/dL — ABNORMAL HIGH (ref 8–23)
CO2: 32 mmol/L (ref 22–32)
Calcium: 8.8 mg/dL — ABNORMAL LOW (ref 8.9–10.3)
Chloride: 95 mmol/L — ABNORMAL LOW (ref 98–111)
Creatinine, Ser: 0.94 mg/dL (ref 0.44–1.00)
GFR calc Af Amer: >60 mL/min (ref >60)
GFR calc non Af Amer: 54 mL/min — ABNORMAL LOW (ref >60)
Glucose, Bld: 91 mg/dL (ref 70–99)
Potassium: 2.7 mmol/L — CL (ref 3.5–5.1)
Sodium: 138 mmol/L (ref 135–145)

## 2018-07-20 LAB — CBC
HCT: 27.1 % — ABNORMAL LOW (ref 36.0–46.0)
Hemoglobin: 8.9 g/dL — ABNORMAL LOW (ref 12.0–15.0)
MCH: 23 pg — ABNORMAL LOW (ref 26.0–34.0)
MCHC: 32.8 g/dL (ref 30.0–36.0)
MCV: 70 fL — ABNORMAL LOW (ref 80.0–100.0)
Platelets: 190 10*3/uL (ref 150–400)
RBC: 3.87 MIL/uL (ref 3.87–5.11)
RDW: 13.7 % (ref 11.5–15.5)
WBC: 9.1 10*3/uL (ref 4.0–10.5)
nRBC: 0 % (ref 0.0–0.2)

## 2018-07-20 LAB — MAGNESIUM: Magnesium: 1.5 mg/dL — ABNORMAL LOW (ref 1.7–2.4)

## 2018-07-20 LAB — GLUCOSE, CAPILLARY: Glucose-Capillary: 198 mg/dL — ABNORMAL HIGH (ref 70–99)

## 2018-07-20 MED ORDER — POTASSIUM CHLORIDE CRYS ER 20 MEQ PO TBCR
40.0000 meq | EXTENDED_RELEASE_TABLET | ORAL | Status: AC
  Administered 2018-07-20: 40 meq via ORAL
  Filled 2018-07-20: qty 2

## 2018-07-20 MED ORDER — MAGNESIUM SULFATE 2 GM/50ML IV SOLN
2.0000 g | Freq: Once | INTRAVENOUS | Status: AC
  Administered 2018-07-20: 2 g via INTRAVENOUS
  Filled 2018-07-20: qty 50

## 2018-07-20 NOTE — Progress Notes (Signed)
Patient ID: Katrianna Friesenhahn, female   DOB: [birth year], 85 y.o.   MRN: [id number]  This NP visited patient at the bedside as a follow up to  yesterday's [place].   Patient is alert and tells me she is "having a good day".  Discussed with her briefly that the plan will be to discharge to a skilled nursing facility for some rehab.  Ms. Ulloa is open to that disposition.  Patient's daughter is her main support person, we discussed again the importance of continued conversation with family members and the pateitnt and the medical providers regarding overall plan of care and treatment options,  ensuring decisions are within the context of the patients values and GOCs.    Recommend palliative services to follow at facility  Questions and concerns addressed   Discussed with Dr Starla Link and bedside RN  Total time spent on the unit was 15 minutes  Greater than 50% of the time was spent in counseling and coordination of care  Wadie Lessen NP  Palliative Medicine Team Team Phone # [phone] Pager [phone]

## 2018-07-20 NOTE — Progress Notes (Signed)
Attempt to call report to SNF no response will call again.

## 2018-07-20 NOTE — Progress Notes (Signed)
Report given to [facility] center awaiting [facility] to pick up patient.

## 2018-07-20 NOTE — Progress Notes (Signed)
Called to the room by PT patient pass out while on bedside commode for about 30-45 sec pt was incont of urine BP was elevated during the episode noe BP is better stable. See flowsheet for v/s. MD notified, no new order given, MD instruct to monitor the patient if back to her baseline okay to d/c patient as planned. Patient is back to her baseline, will continue to monitor the patient till d/c

## 2018-07-20 NOTE — Care Management Important Message (Signed)
Important Message  Patient Details  Name: Brianna Hunter MRN: [id number] Date of Birth: Nov 23, 1928   Medicare Important Message Given:  Yes    Memory Argue 07/20/2018, 4:07 PM

## 2018-07-20 NOTE — Discharge Summary (Signed)
Physician Discharge Summary  Brianna Hunter DZH:[id number] DOB: 01/11/1929 DOA: 07/13/2018  PCP: [name], MD  Admit date: 07/13/2018 Discharge date: 07/20/2018  Admitted From: Home Disposition: SNF  Recommendations for Outpatient Follow-up:  1. Follow up with SNF provider at earliest convenience with repeat CBC/BMP 2.  follow up in ED if symptoms worsen or new appear 3. Patient will benefit from outpatient evaluation and follow-up by palliative care team   Home Health: No Equipment/Devices: None  Discharge Condition: Guarded  CODE STATUS: Full Diet recommendation: Heart healthy  Brief/Interim Summary: 85 year old female with history of left breast DCIS status post lumpectomy treated with tamoxifen, hypertension, hyperlipidemia, dementia, anxiety, valvular heart disease and bifascicular block presented with fall at home.  She was initially hypotensive and had A. fib with RVR.  COVID-19 was negative.  Chest x-ray was negative for any acute finding.  CT head was negative.  She was started on broad-spectrum antibiotics.  Work-up was negative for any infection, antibiotics were subsequently discontinued.  Her heart rate improved subsequently.  PT is now recommending SNF placement.  Discharge to SNF once bed is available.  Discharge Diagnoses:  Principal Problem:   Fall at home, initial encounter Active Problems:   HYPERTENSION, BENIGN   Microcytic anemia   Lactic acidosis   AKI (acute kidney injury) ([place])   High anion gap metabolic acidosis   Elevated liver enzymes   Thrombocytopenia ([facility])   Elevated troponin   Atrial fibrillation with RVR ([place])   Acute kidney injury (AKI) with acute tubular necrosis (ATN) ([facility])   Protein-calorie malnutrition, severe   Pressure injury of skin   Weakness generalized   Palliative care by specialist   DNR (do not resuscitate) discussion  Fall at home, most likely mechanical fall -CT head was negative for any acute intracranial  abnormality. -Fall precautions -SNF placement once bed is available as per PT recommendations. -Patient is medically stable for discharge  New onset paroxysmal A. fib with RVR -Resolved.  Currently normal sinus rhythm -Provoked in the setting of stress, fall, dehydration and acute kidney injury -Not a candidate for anticoagulation because of advanced dementia and frequent falls -Rate controlled.  Acute kidney injury: Present on admission -Resolved.   -Off IV fluids.  Hypokalemia -Replaced.    Outpatient follow-up  Hypomagnesemia -Replaced.  Outpatient follow-up  Elevated CK levels:  -Only minimally elevated: Improved with IV fluids  Lactic acidosis -Resolved -Initially started on broad-spectrum antibiotics with concern for infection.  But after work-up, there was no evidence of infection.  Antibiotics have been discontinued.  Currently afebrile  Minimally elevated LFTs -Improving  Elevated troponin -Multifactorial in the setting of acute kidney injury, RVR, dehydration and lactic acidosis -No ischemic change in EKG -Troponins did not trend up -Echo showed EF of 55 to 60% with mild diastolic dysfunction.  No further work-up or evaluation indicated.  No chest pains.  Hypertension -Blood pressure stable.  Continue current medications  Dementia with probable anxiety/depression-continue current regimen.  Monitor mental status.  Fall precautions -Palliative care evaluation appreciated.  Patient remains full code.    Patient will benefit from outpatient evaluation by palliative care team  Chronic microcytic anemia -Stable  Thrombocytopenia -Questionable cause.  No signs of bleeding   Discharge Instructions  Discharge Instructions    Diet - low sodium heart healthy   Complete by:  As directed    Increase activity slowly   Complete by:  As directed      Allergies as of 07/20/2018  Reactions   Milk-related Compounds Other (See Comments)   Frequent  bowel movements    Augmentin [amoxicillin-pot Clavulanate] Diarrhea      Medication List    TAKE these medications   bimatoprost 0.01 % Soln Commonly known as:  LUMIGAN Place 1 drop into both eyes at bedtime.   celecoxib 200 MG capsule Commonly known as:  CELEBREX Take 200 mg by mouth at bedtime as needed (pain).   citalopram 20 MG tablet Commonly known as:  CELEXA Take 20 mg by mouth daily.   cycloSPORINE 0.05 % ophthalmic emulsion Commonly known as:  RESTASIS Place 1 drop into both eyes every 12 (twelve) hours.   feeding supplement Liqd Take 1 Container by mouth 2 (two) times daily between meals.   Olmesartan-amLODIPine-HCTZ 40-5-25 MG Tabs Take 1 tablet by mouth daily.   VITAMIN C PO Take 1 tablet by mouth daily.      Follow-up Information    [name], MD. Schedule an appointment as soon as possible for a visit in 1 week(s).   Specialty:  Family Medicine Why:  with repeat cbc/bmp Contact information: [address] [place] [address] [phone]        palliative care. Schedule an appointment as soon as possible for a visit in 1 week(s).          Allergies  Allergen Reactions  . Milk-Related Compounds Other (See Comments)    Frequent bowel movements   . Augmentin [Amoxicillin-Pot Clavulanate] Diarrhea    Consultations:  Palliative care   Procedures/Studies: Ct Head Wo Contrast  Result Date: 07/13/2018 CLINICAL DATA:  Altered mental status. EXAM: CT HEAD WITHOUT CONTRAST TECHNIQUE: Contiguous axial images were obtained from the base of the skull through the vertex without intravenous contrast. COMPARISON:  03/28/2014 FINDINGS: Brain: Ventricles, cisterns and other CSF spaces are normal. There is no mass, mass effect, shift of midline structures or acute hemorrhage. No evidence of acute infarction. Minimal chronic ischemic microvascular disease. Vascular: No hyperdense vessel or unexpected calcification. Skull: Normal. Negative for  fracture or focal lesion. Sinuses/Orbits: No acute finding. Other: None. IMPRESSION: No acute findings. Chronic ischemic microvascular disease. Electronically Signed   By: Marin Olp M.D.   On: 07/13/2018 12:32   Dg Chest Port 1 View  Result Date: 07/13/2018 CLINICAL DATA:  chest pain EXAM: PORTABLE CHEST - 1 VIEW COMPARISON:  07/09/2013 FINDINGS: Minimal linear opacities in the right upper lobe at site of previous cavitary lesion. Left lung clear. Heart size and mediastinal contours are within normal limits. Aortic Atherosclerosis (ICD10-170.0). No effusion. No pneumothorax. Visualized bones unremarkable. IMPRESSION: No acute cardiopulmonary disease. Electronically Signed   By: Lucrezia Europe M.D.   On: 07/13/2018 12:16    Echo IMPRESSIONS   1. The left ventricle has normal systolic function, with an ejection fraction of 55-60%. The cavity size was normal. Left ventricular diastolic Doppler parameters are consistent with impaired relaxation. 2. The right ventricle has normal systolc function. The cavity was mildly enlarged. 3. Right atrial size was moderately dilated. 4. Small pericardial effusion. 5. The mitral valve is abnormal. Mild thickening of the mitral valve leaflet. 6. The tricuspid valve was grossly normal. Tricuspid valve regurgitation is moderate-severe. 7. The aortic valve is tricuspid Mild calcification of the aortic valve. Aortic valve regurgitation is mild by color flow Doppler. No stenosis of the aortic valve. 8. Normal LV function; mild diastolic dysfunction; mild AI; moderate RAE; mild RVE; moderate to severe TR; moderate pulmonary hypertension.   Subjective: Patient  seen and examined at bedside.  No overnight fever or vomiting reported by nursing staff.  She is sleepy, hardly wakes up on calling her name.  Discharge Exam: Vitals:   07/20/18 1028 07/20/18 1136  BP: (!) 122/59 112/62  Pulse: 63 65  Resp:  18  Temp: 98 F (36.7 C) (!) 97.5 F (36.4 C)  SpO2:   100%    General exam: Elderly female.  No distress.  Sleepy, hardly wakes up on calling her name. Respiratory system: Bilateral decreased breath sounds at bases with some scattered crackles.   Cardiovascular system: Rate controlled, S1-S2 heard Gastrointestinal system: Abdomen is nondistended, soft and nontender. Normal bowel sounds heard. Extremities: No cyanosis, edema     The results of significant diagnostics from this hospitalization (including imaging, microbiology, ancillary and laboratory) are listed below for reference.     Microbiology: Recent Results (from the past 240 hour(s))  Blood culture (routine x 2)     Status: None   Collection Time: 07/13/18  1:58 PM  Result Value Ref Range Status   Specimen Description BLOOD RIGHT ANTECUBITAL  Final   Special Requests   Final    BOTTLES DRAWN AEROBIC AND ANAEROBIC Blood Culture adequate volume   Culture   Final    NO GROWTH 6 DAYS Performed at [facility], [address], [place], [place] [address]    Report Status 07/19/2018 FINAL  Final  Blood culture (routine x 2)     Status: None   Collection Time: 07/13/18  2:01 PM  Result Value Ref Range Status   Specimen Description BLOOD RIGHT HAND  Final   Special Requests   Final    BOTTLES DRAWN AEROBIC AND ANAEROBIC Blood Culture results may not be optimal due to an inadequate volume of blood received in culture bottles   Culture   Final    NO GROWTH 6 DAYS Performed at [facility], [place] [address], [place], [place] [address]    Report Status 07/19/2018 FINAL  Final  SARS Coronavirus 2 [facility] order, Performed in [place] hospital lab)     Status: None   Collection Time: 07/13/18  2:19 PM  Result Value Ref Range Status   SARS Coronavirus 2 NEGATIVE NEGATIVE Final    Comment: (NOTE) If result is NEGATIVE SARS-CoV-2 target nucleic acids are NOT DETECTED. The SARS-CoV-2 RNA is generally detectable in upper and lower  respiratory specimens during the acute  phase of infection. The lowest  concentration of SARS-CoV-2 viral copies this assay can detect is 250  copies / mL. A negative result does not preclude SARS-CoV-2 infection  and should not be used as the sole basis for treatment or other  patient management decisions.  A negative result may occur with  improper specimen collection / handling, submission of specimen other  than nasopharyngeal swab, presence of viral mutation(s) within the  areas targeted by this assay, and inadequate number of viral copies  (<250 copies / mL). A negative result must be combined with clinical  observations, patient history, and epidemiological information. If result is POSITIVE SARS-CoV-2 target nucleic acids are DETECTED. The SARS-CoV-2 RNA is generally detectable in upper and lower  respiratory specimens dur ing the acute phase of infection.  Positive  results are indicative of active infection with SARS-CoV-2.  Clinical  correlation with patient history and other diagnostic information is  necessary to determine patient infection status.  Positive results do  not rule out bacterial infection or co-infection with other  viruses. If result is PRESUMPTIVE POSTIVE SARS-CoV-2 nucleic acids MAY BE PRESENT.   A presumptive positive result was obtained on the submitted specimen  and confirmed on repeat testing.  While 2019 novel coronavirus  (SARS-CoV-2) nucleic acids may be present in the submitted sample  additional confirmatory testing may be necessary for epidemiological  and / or clinical management purposes  to differentiate between  SARS-CoV-2 and other Sarbecovirus currently known to infect humans.  If clinically indicated additional testing with an alternate test  methodology [phone]) is advised. The SARS-CoV-2 RNA is generally  detectable in upper and lower respiratory sp ecimens during the acute  phase of infection. The expected result is Negative. Fact Sheet for Patients:   StrictlyIdeas.no Fact Sheet for Healthcare Providers: BankingDealers.co.za This test is not yet approved or cleared by the [place] FDA and has been authorized for detection and/or diagnosis of SARS-CoV-2 by FDA under an Emergency Use Authorization (EUA).  This EUA will remain in effect (meaning this test can be used) for the duration of the COVID-19 declaration under Section 564(b)(1) of the Act, 21 U.S.C. section 360bbb-3(b)(1), unless the authorization is terminated or revoked sooner. Performed at [facility], [place] [address] [place], [place] [address]   Urine Culture     Status: None   Collection Time: 07/13/18  2:48 PM  Result Value Ref Range Status   Specimen Description URINE, RANDOM  Final   Special Requests NONE  Final   Culture   Final    NO GROWTH Performed at [facility], [place] [address] [place], [place] [address]    Report Status 07/14/2018 FINAL  Final  SARS Coronavirus 2 (CEPHEID- Performed in [facility] hospital lab), Hosp Order     Status: None   Collection Time: 07/19/18 12:14 PM  Result Value Ref Range Status   SARS Coronavirus 2 NEGATIVE NEGATIVE Final    Comment: (NOTE) If result is NEGATIVE SARS-CoV-2 target nucleic acids are NOT DETECTED. The SARS-CoV-2 RNA is generally detectable in upper and lower  respiratory specimens during the acute phase of infection. The lowest  concentration of SARS-CoV-2 viral copies this assay can detect is 250  copies / mL. A negative result does not preclude SARS-CoV-2 infection  and should not be used as the sole basis for treatment or other  patient management decisions.  A negative result may occur with  improper specimen collection / handling, submission of specimen other  than nasopharyngeal swab, presence of viral mutation(s) within the  areas targeted by this assay, and inadequate number of viral copies  (<250 copies / mL). A negative result must be  combined with clinical  observations, patient history, and epidemiological information. If result is POSITIVE SARS-CoV-2 target nucleic acids are DETECTED. The SARS-CoV-2 RNA is generally detectable in upper and lower  respiratory specimens dur ing the acute phase of infection.  Positive  results are indicative of active infection with SARS-CoV-2.  Clinical  correlation with patient history and other diagnostic information is  necessary to determine patient infection status.  Positive results do  not rule out bacterial infection or co-infection with other viruses. If result is PRESUMPTIVE POSTIVE SARS-CoV-2 nucleic acids MAY BE PRESENT.   A presumptive positive result was obtained on the submitted specimen  and confirmed on repeat testing.  While 2019 novel coronavirus  (SARS-CoV-2) nucleic acids may be present in the submitted sample  additional confirmatory testing may be necessary for epidemiological  and / or clinical management purposes  to differentiate between  SARS-CoV-2 and other Sarbecovirus currently known to infect humans.  If clinically indicated additional testing with an alternate test  methodology [phone]) is advised. The SARS-CoV-2 RNA is generally  detectable in upper and lower respiratory sp ecimens during the acute  phase of infection. The expected result is Negative. Fact Sheet for Patients:  StrictlyIdeas.no Fact Sheet for Healthcare Providers: BankingDealers.co.za This test is not yet approved or cleared by the [place] FDA and has been authorized for detection and/or diagnosis of SARS-CoV-2 by FDA under an Emergency Use Authorization (EUA).  This EUA will remain in effect (meaning this test can be used) for the duration of the COVID-19 declaration under Section 564(b)(1) of the Act, 21 U.S.C. section 360bbb-3(b)(1), unless the authorization is terminated or revoked sooner. Performed at [facility], [place] [address] [place], [place] [address]      Labs: BNP (last 3 results) No results for input(s): BNP in the last 8760 hours. Basic Metabolic Panel: Recent Labs  Lab 07/15/18 0516 07/16/18 0548 07/17/18 0249 07/18/18 0305 07/20/18 0552  NA 141 141 139 140 138  K 3.4* 3.3* 3.0* 3.0* 2.7*  CL 107 110 102 104 95*  CO2 24 23 26 27  32  GLUCOSE 80 89 122* 143* 91  BUN 37* 27* 28* 35* 24*  CREATININE 1.53* 1.09* 1.05* 1.08* 0.94  CALCIUM 9.3 8.4* 8.8* 8.7* 8.8*  MG  --   --   --   --  1.5*   Liver Function Tests: Recent Labs  Lab 07/14/18 0322  AST 61*  ALT 63*  ALKPHOS 64  BILITOT 0.7  PROT 6.1*  ALBUMIN 2.9*   No results for input(s): LIPASE, AMYLASE in the last 168 hours. No results for input(s): AMMONIA in the last 168 hours. CBC: Recent Labs  Lab 07/16/18 0548 07/17/18 0249 07/18/18 0305 07/19/18 0524 07/20/18 0552  WBC 7.2 10.5 13.8* 9.4 9.1  HGB 9.5* 9.3* 8.7* 8.4* 8.9*  HCT 30.3* 29.2* 26.3* 26.3* 27.1*  MCV 70.6* 70.0* 69.6* 69.6* 70.0*  PLT 116* 134* 132* 126* 190   Cardiac Enzymes: Recent Labs  Lab 07/13/18 2101 07/14/18 0322 07/14/18 0744  CKTOTAL  --   --  257*  CKMB  --   --  6.2*  TROPONINI 0.93* 0.88*  --    BNP: Invalid input(s): POCBNP CBG: No results for input(s): GLUCAP in the last 168 hours. D-Dimer No results for input(s): DDIMER in the last 72 hours. Hgb A1c No results for input(s): HGBA1C in the last 72 hours. Lipid Profile No results for input(s): CHOL, HDL, LDLCALC, TRIG, CHOLHDL, LDLDIRECT in the last 72 hours. Thyroid function studies No results for input(s): TSH, T4TOTAL, T3FREE, THYROIDAB in the last 72 hours.  Invalid input(s): FREET3 Anemia work up No results for input(s): VITAMINB12, FOLATE, FERRITIN, TIBC, IRON, RETICCTPCT in the last 72 hours. Urinalysis    Component Value Date/Time   COLORURINE YELLOW 07/13/2018 1154   APPEARANCEUR CLEAR 07/13/2018 1154   LABSPEC >1.030 (H) 07/13/2018 1154   PHURINE  5.0 07/13/2018 1154   GLUCOSEU NEGATIVE 07/13/2018 1154   HGBUR SMALL (A) 07/13/2018 1154   BILIRUBINUR SMALL (A) 07/13/2018 1154   KETONESUR 15 (A) 07/13/2018 1154   PROTEINUR 100 (A) 07/13/2018 1154   UROBILINOGEN 0.2 03/28/2014 1429   NITRITE NEGATIVE 07/13/2018 1154   LEUKOCYTESUR NEGATIVE 07/13/2018 1154   Sepsis Labs Invalid input(s): PROCALCITONIN,  WBC,  LACTICIDVEN Microbiology Recent Results (from the past 240 hour(s))  Blood culture (  routine x 2)     Status: None   Collection Time: 07/13/18  1:58 PM  Result Value Ref Range Status   Specimen Description BLOOD RIGHT ANTECUBITAL  Final   Special Requests   Final    BOTTLES DRAWN AEROBIC AND ANAEROBIC Blood Culture adequate volume   Culture   Final    NO GROWTH 6 DAYS Performed at [facility], [address] [place], [place] [address]    Report Status 07/19/2018 FINAL  Final  Blood culture (routine x 2)     Status: None   Collection Time: 07/13/18  2:01 PM  Result Value Ref Range Status   Specimen Description BLOOD RIGHT HAND  Final   Special Requests   Final    BOTTLES DRAWN AEROBIC AND ANAEROBIC Blood Culture results may not be optimal due to an inadequate volume of blood received in culture bottles   Culture   Final    NO GROWTH 6 DAYS Performed at [facility], [place] [address] [place], [place] [address]    Report Status 07/19/2018 FINAL  Final  SARS Coronavirus 2 [facility] order, Performed in [place] hospital lab)     Status: None   Collection Time: 07/13/18  2:19 PM  Result Value Ref Range Status   SARS Coronavirus 2 NEGATIVE NEGATIVE Final    Comment: (NOTE) If result is NEGATIVE SARS-CoV-2 target nucleic acids are NOT DETECTED. The SARS-CoV-2 RNA is generally detectable in upper and lower  respiratory specimens during the acute phase of infection. The lowest  concentration of SARS-CoV-2 viral copies this assay can detect is 250  copies / mL. A negative result does not preclude SARS-CoV-2  infection  and should not be used as the sole basis for treatment or other  patient management decisions.  A negative result may occur with  improper specimen collection / handling, submission of specimen other  than nasopharyngeal swab, presence of viral mutation(s) within the  areas targeted by this assay, and inadequate number of viral copies  (<250 copies / mL). A negative result must be combined with clinical  observations, patient history, and epidemiological information. If result is POSITIVE SARS-CoV-2 target nucleic acids are DETECTED. The SARS-CoV-2 RNA is generally detectable in upper and lower  respiratory specimens dur ing the acute phase of infection.  Positive  results are indicative of active infection with SARS-CoV-2.  Clinical  correlation with patient history and other diagnostic information is  necessary to determine patient infection status.  Positive results do  not rule out bacterial infection or co-infection with other viruses. If result is PRESUMPTIVE POSTIVE SARS-CoV-2 nucleic acids MAY BE PRESENT.   A presumptive positive result was obtained on the submitted specimen  and confirmed on repeat testing.  While 2019 novel coronavirus  (SARS-CoV-2) nucleic acids may be present in the submitted sample  additional confirmatory testing may be necessary for epidemiological  and / or clinical management purposes  to differentiate between  SARS-CoV-2 and other Sarbecovirus currently known to infect humans.  If clinically indicated additional testing with an alternate test  methodology [phone]) is advised. The SARS-CoV-2 RNA is generally  detectable in upper and lower respiratory sp ecimens during the acute  phase of infection. The expected result is Negative. Fact Sheet for Patients:  StrictlyIdeas.no Fact Sheet for Healthcare Providers: BankingDealers.co.za This test is not yet approved or cleared by the [place]  FDA and has been authorized for detection and/or diagnosis of SARS-CoV-2 by FDA under an Emergency  Use Authorization (EUA).  This EUA will remain in effect (meaning this test can be used) for the duration of the COVID-19 declaration under Section 564(b)(1) of the Act, 21 U.S.C. section 360bbb-3(b)(1), unless the authorization is terminated or revoked sooner. Performed at [facility], [place] [address] [place], [place] [address]   Urine Culture     Status: None   Collection Time: 07/13/18  2:48 PM  Result Value Ref Range Status   Specimen Description URINE, RANDOM  Final   Special Requests NONE  Final   Culture   Final    NO GROWTH Performed at [facility], [place] [address], [place], [place] [address]    Report Status 07/14/2018 FINAL  Final  SARS Coronavirus 2 (CEPHEID- Performed in [facility] hospital lab), Hosp Order     Status: None   Collection Time: 07/19/18 12:14 PM  Result Value Ref Range Status   SARS Coronavirus 2 NEGATIVE NEGATIVE Final    Comment: (NOTE) If result is NEGATIVE SARS-CoV-2 target nucleic acids are NOT DETECTED. The SARS-CoV-2 RNA is generally detectable in upper and lower  respiratory specimens during the acute phase of infection. The lowest  concentration of SARS-CoV-2 viral copies this assay can detect is 250  copies / mL. A negative result does not preclude SARS-CoV-2 infection  and should not be used as the sole basis for treatment or other  patient management decisions.  A negative result may occur with  improper specimen collection / handling, submission of specimen other  than nasopharyngeal swab, presence of viral mutation(s) within the  areas targeted by this assay, and inadequate number of viral copies  (<250 copies / mL). A negative result must be combined with clinical  observations, patient history, and epidemiological information. If result is POSITIVE SARS-CoV-2 target nucleic acids are DETECTED. The SARS-CoV-2 RNA is  generally detectable in upper and lower  respiratory specimens dur ing the acute phase of infection.  Positive  results are indicative of active infection with SARS-CoV-2.  Clinical  correlation with patient history and other diagnostic information is  necessary to determine patient infection status.  Positive results do  not rule out bacterial infection or co-infection with other viruses. If result is PRESUMPTIVE POSTIVE SARS-CoV-2 nucleic acids MAY BE PRESENT.   A presumptive positive result was obtained on the submitted specimen  and confirmed on repeat testing.  While 2019 novel coronavirus  (SARS-CoV-2) nucleic acids may be present in the submitted sample  additional confirmatory testing may be necessary for epidemiological  and / or clinical management purposes  to differentiate between  SARS-CoV-2 and other Sarbecovirus currently known to infect humans.  If clinically indicated additional testing with an alternate test  methodology [phone]) is advised. The SARS-CoV-2 RNA is generally  detectable in upper and lower respiratory sp ecimens during the acute  phase of infection. The expected result is Negative. Fact Sheet for Patients:  StrictlyIdeas.no Fact Sheet for Healthcare Providers: BankingDealers.co.za This test is not yet approved or cleared by the [place] FDA and has been authorized for detection and/or diagnosis of SARS-CoV-2 by FDA under an Emergency Use Authorization (EUA).  This EUA will remain in effect (meaning this test can be used) for the duration of the COVID-19 declaration under Section 564(b)(1) of the Act, 21 U.S.C. section 360bbb-3(b)(1), unless the authorization is terminated or revoked sooner. Performed at [facility], [place] [address], [place], [place] [address]      Time coordinating discharge: 35 minutes  SIGNED:   Aline August, MD  [facility] 07/20/2018, 1:43 PM

## 2018-07-20 NOTE — Progress Notes (Signed)
Patient ID: Eleisha Branscomb, female   DOB: 08-27-1928, 85 y.o.   MRN: [id number]  PROGRESS NOTE    Korrie Hofbauer  BEM:[id number] DOB: 1928-04-02 DOA: 07/13/2018 PCP: [name], MD   Brief Narrative:  85 year old female with history of left breast DCIS status post lumpectomy treated with tamoxifen, hypertension, hyperlipidemia, dementia, anxiety, valvular heart disease and bifascicular block presented with fall at home.  She was initially hypotensive and had A. fib with RVR.  COVID-19 was negative.  Chest x-ray was negative for any acute finding.  CT head was negative.  She was started on broad-spectrum antibiotics.  Work-up was negative for any infection, antibiotics were subsequently discontinued.  Her heart rate improved subsequently.  PT is now recommending SNF placement.  Assessment & Plan:  Fall at home, most likely mechanical fall -CT head was negative for any acute intracranial abnormality. -Fall precautions -SNF placement once bed is available as per PT recommendations. -Social worker following.  Patient is medically stable for discharge  New onset paroxysmal A. fib with RVR -Resolved.  Currently normal sinus rhythm -Provoked in the setting of stress, fall, dehydration and acute kidney injury -Not a candidate for anticoagulation because of advanced dementia and frequent falls -Rate controlled.  Acute kidney injury: Present on admission -Resolved.   -Off IV fluids.  Hypokalemia -Replace.  Repeat a.m. labs  Hypomagnesemia -Replace.  Repeat a.m. labs  Elevated CK levels:  -Only minimally elevated: Improved with IV fluids  Lactic acidosis -Resolved -Initially started on broad-spectrum antibiotics with concern for infection.  But after work-up, there was no evidence of infection.  Antibiotics have been discontinued.  Currently afebrile  Minimally elevated LFTs -Improving  Elevated troponin -Multifactorial in the setting of acute kidney injury, RVR, dehydration and  lactic acidosis -No ischemic change in EKG -Troponins did not trend up -Echo showed EF of 55 to 60% with mild diastolic dysfunction.  No further work-up or evaluation indicated.  No chest pains.  Hypertension -Blood pressure stable.  Continue current medications  Dementia with probable anxiety/depression-continue current regimen.  Monitor mental status.  Fall precautions -Patient remains full code.  Will request palliative care evaluation for goals of care discussion.  Chronic microcytic anemia -Stable  Thrombocytopenia -Questionable cause.  No signs of bleeding   DVT prophylaxis: Heparin subcutaneous Code Status: Full Family Communication: None at bedside Disposition Plan: SNF once bed is available.  Patient is medically stable for discharge  Consultants: None  Procedures:  Echo IMPRESSIONS    1. The left ventricle has normal systolic function, with an ejection fraction of 55-60%. The cavity size was normal. Left ventricular diastolic Doppler parameters are consistent with impaired relaxation.  2. The right ventricle has normal systolc function. The cavity was mildly enlarged.  3. Right atrial size was moderately dilated.  4. Small pericardial effusion.  5. The mitral valve is abnormal. Mild thickening of the mitral valve leaflet.  6. The tricuspid valve was grossly normal. Tricuspid valve regurgitation is moderate-severe.  7. The aortic valve is tricuspid Mild calcification of the aortic valve. Aortic valve regurgitation is mild by color flow Doppler. No stenosis of the aortic valve.  8. Normal LV function; mild diastolic dysfunction; mild AI; moderate RAE; mild RVE; moderate to severe TR; moderate pulmonary hypertension.  Antimicrobials:  Vancomycin, cefepime and Flagyl 1 dose in the ER  Subjective: Patient seen and examined at bedside.  No overnight fever or vomiting reported by nursing staff.  She is sleepy, hardly wakes up on calling her name. Objective: Vitals:  07/19/18 1107 07/19/18 2029 07/20/18 0329 07/20/18 0435  BP: 120/62 (!) 122/59  138/65  Pulse: (!) 58 62  (!) 57  Resp: 18 18  18   Temp: 98.9 F (37.2 C) 98.7 F (37.1 C)  99.1 F (37.3 C)  TempSrc: Oral Oral  Oral  SpO2: 100% 100%  100%  Weight:   52.2 kg   Height:        Intake/Output Summary (Last 24 hours) at 07/20/2018 0723 Last data filed at 07/19/2018 2150 Gross per 24 hour  Intake 885 ml  Output 700 ml  Net 185 ml   Filed Weights   07/18/18 0541 07/19/18 0702 07/20/18 0329  Weight: 50.1 kg 49.1 kg 52.2 kg    Examination:  General exam: Elderly female.  No distress.  Sleepy, hardly wakes up on calling her name. Respiratory system: Bilateral decreased breath sounds at bases with some scattered crackles.   Cardiovascular system: Rate controlled, S1-S2 heard Gastrointestinal system: Abdomen is nondistended, soft and nontender. Normal bowel sounds heard. Extremities: No cyanosis, edema    Data Reviewed: I have personally reviewed following labs and imaging studies  CBC: Recent Labs  Lab 07/13/18 1154  07/16/18 0548 07/17/18 0249 07/18/18 0305 07/19/18 0524 07/20/18 0552  WBC 8.8   < > 7.2 10.5 13.8* 9.4 9.1  NEUTROABS 7.1  --   --   --   --   --   --   HGB 9.9*   < > 9.5* 9.3* 8.7* 8.4* 8.9*  HCT 32.1*   < > 30.3* 29.2* 26.3* 26.3* 27.1*  MCV 73.0*   < > 70.6* 70.0* 69.6* 69.6* 70.0*  PLT 125*   < > 116* 134* 132* 126* 190   < > = values in this interval not displayed.   Basic Metabolic Panel: Recent Labs  Lab 07/15/18 0516 07/16/18 0548 07/17/18 0249 07/18/18 0305 07/20/18 0552  NA 141 141 139 140 138  K 3.4* 3.3* 3.0* 3.0* 2.7*  CL 107 110 102 104 95*  CO2 24 23 26 27  32  GLUCOSE 80 89 122* 143* 91  BUN 37* 27* 28* 35* 24*  CREATININE 1.53* 1.09* 1.05* 1.08* 0.94  CALCIUM 9.3 8.4* 8.8* 8.7* 8.8*  MG  --   --   --   --  1.5*   GFR: Estimated Creatinine Clearance: 28.7 mL/min (by C-G formula based on SCr of 0.94 mg/dL). Liver Function  Tests: Recent Labs  Lab 07/13/18 1154 07/14/18 0322  AST 160* 61*  ALT 100* 63*  ALKPHOS 71 64  BILITOT 0.5 0.7  PROT 6.6 6.1*  ALBUMIN 3.2* 2.9*   No results for input(s): LIPASE, AMYLASE in the last 168 hours. No results for input(s): AMMONIA in the last 168 hours. Coagulation Profile: No results for input(s): INR, PROTIME in the last 168 hours. Cardiac Enzymes: Recent Labs  Lab 07/13/18 1154 07/13/18 2101 07/14/18 0322 07/14/18 0744  CKTOTAL  --   --   --  257*  CKMB  --   --   --  6.2*  TROPONINI 0.08* 0.93* 0.88*  --    BNP (last 3 results) No results for input(s): PROBNP in the last 8760 hours. HbA1C: No results for input(s): HGBA1C in the last 72 hours. CBG: No results for input(s): GLUCAP in the last 168 hours. Lipid Profile: No results for input(s): CHOL, HDL, LDLCALC, TRIG, CHOLHDL, LDLDIRECT in the last 72 hours. Thyroid Function Tests: No results for input(s): TSH, T4TOTAL, FREET4, T3FREE, THYROIDAB in the last 72  hours. Anemia Panel: No results for input(s): VITAMINB12, FOLATE, FERRITIN, TIBC, IRON, RETICCTPCT in the last 72 hours. Sepsis Labs: Recent Labs  Lab 07/13/18 1404 07/13/18 1817 07/14/18 0744  LATICACIDVEN 4.3* 2.2* 1.3    Recent Results (from the past 240 hour(s))  Blood culture (routine x 2)     Status: None   Collection Time: 07/13/18  1:58 PM  Result Value Ref Range Status   Specimen Description BLOOD RIGHT ANTECUBITAL  Final   Special Requests   Final    BOTTLES DRAWN AEROBIC AND ANAEROBIC Blood Culture adequate volume   Culture   Final    NO GROWTH 6 DAYS Performed at [facility], [address], [place], [place] [address]    Report Status 07/19/2018 FINAL  Final  Blood culture (routine x 2)     Status: None   Collection Time: 07/13/18  2:01 PM  Result Value Ref Range Status   Specimen Description BLOOD RIGHT HAND  Final   Special Requests   Final    BOTTLES DRAWN AEROBIC AND ANAEROBIC Blood Culture results may not  be optimal due to an inadequate volume of blood received in culture bottles   Culture   Final    NO GROWTH 6 DAYS Performed at [facility], [place] [address], [place],  [address]    Report Status 07/19/2018 FINAL  Final  SARS Coronavirus 2 [facility] order, Performed in [place] [facility] lab)     Status: None   Collection Time: 07/13/18  2:19 PM  Result Value Ref Range Status   SARS Coronavirus 2 NEGATIVE NEGATIVE Final    Comment: (NOTE) If result is NEGATIVE SARS-CoV-2 target nucleic acids are NOT DETECTED. The SARS-CoV-2 RNA is generally detectable in upper and lower  respiratory specimens during the acute phase of infection. The lowest  concentration of SARS-CoV-2 viral copies this assay can detect is 250  copies / mL. A negative result does not preclude SARS-CoV-2 infection  and should not be used as the sole basis for treatment or other  patient management decisions.  A negative result may occur with  improper specimen collection / handling, submission of specimen other  than nasopharyngeal swab, presence of viral mutation(s) within the  areas targeted by this assay, and inadequate number of viral copies  (<250 copies / mL). A negative result must be combined with clinical  observations, patient history, and epidemiological information. If result is POSITIVE SARS-CoV-2 target nucleic acids are DETECTED. The SARS-CoV-2 RNA is generally detectable in upper and lower  respiratory specimens dur ing the acute phase of infection.  Positive  results are indicative of active infection with SARS-CoV-2.  Clinical  correlation with patient history and other diagnostic information is  necessary to determine patient infection status.  Positive results do  not rule out bacterial infection or co-infection with other viruses. If result is PRESUMPTIVE POSTIVE SARS-CoV-2 nucleic acids MAY BE PRESENT.   A presumptive positive result was obtained on the submitted specimen  and  confirmed on repeat testing.  While 2019 novel coronavirus  (SARS-CoV-2) nucleic acids may be present in the submitted sample  additional confirmatory testing may be necessary for epidemiological  and / or clinical management purposes  to differentiate between  SARS-CoV-2 and other Sarbecovirus currently known to infect humans.  If clinically indicated additional testing with an alternate test  methodology [phone]) is advised. The SARS-CoV-2 RNA is generally  detectable in upper and lower respiratory sp ecimens during the acute  phase of infection. The expected result is Negative. Fact Sheet for Patients:  StrictlyIdeas.no Fact Sheet for Healthcare Providers: BankingDealers.co.za This test is not yet approved or cleared by the [place] FDA and has been authorized for detection and/or diagnosis of SARS-CoV-2 by FDA under an Emergency Use Authorization (EUA).  This EUA will remain in effect (meaning this test can be used) for the duration of the COVID-19 declaration under Section 564(b)(1) of the Act, 21 U.S.C. section 360bbb-3(b)(1), unless the authorization is terminated or revoked sooner. Performed at [facility], [place] [address], [place], [place] [address]   Urine Culture     Status: None   Collection Time: 07/13/18  2:48 PM  Result Value Ref Range Status   Specimen Description URINE, RANDOM  Final   Special Requests NONE  Final   Culture   Final    NO GROWTH Performed at [facility], [place] [address], [place], [place] [address]    Report Status 07/14/2018 FINAL  Final  SARS Coronavirus 2 (CEPHEID- Performed in [place] hospital lab), Hosp Order     Status: None   Collection Time: 07/19/18 12:14 PM  Result Value Ref Range Status   SARS Coronavirus 2 NEGATIVE NEGATIVE Final    Comment: (NOTE) If result is NEGATIVE SARS-CoV-2 target nucleic acids are NOT DETECTED. The SARS-CoV-2 RNA is generally detectable in  upper and lower  respiratory specimens during the acute phase of infection. The lowest  concentration of SARS-CoV-2 viral copies this assay can detect is 250  copies / mL. A negative result does not preclude SARS-CoV-2 infection  and should not be used as the sole basis for treatment or other  patient management decisions.  A negative result may occur with  improper specimen collection / handling, submission of specimen other  than nasopharyngeal swab, presence of viral mutation(s) within the  areas targeted by this assay, and inadequate number of viral copies  (<250 copies / mL). A negative result must be combined with clinical  observations, patient history, and epidemiological information. If result is POSITIVE SARS-CoV-2 target nucleic acids are DETECTED. The SARS-CoV-2 RNA is generally detectable in upper and lower  respiratory specimens dur ing the acute phase of infection.  Positive  results are indicative of active infection with SARS-CoV-2.  Clinical  correlation with patient history and other diagnostic information is  necessary to determine patient infection status.  Positive results do  not rule out bacterial infection or co-infection with other viruses. If result is PRESUMPTIVE POSTIVE SARS-CoV-2 nucleic acids MAY BE PRESENT.   A presumptive positive result was obtained on the submitted specimen  and confirmed on repeat testing.  While 2019 novel coronavirus  (SARS-CoV-2) nucleic acids may be present in the submitted sample  additional confirmatory testing may be necessary for epidemiological  and / or clinical management purposes  to differentiate between  SARS-CoV-2 and other Sarbecovirus currently known to infect humans.  If clinically indicated additional testing with an alternate test  methodology [phone]) is advised. The SARS-CoV-2 RNA is generally  detectable in upper and lower respiratory sp ecimens during the acute  phase of infection. The expected result is  Negative. Fact Sheet for Patients:  StrictlyIdeas.no Fact Sheet for Healthcare Providers: BankingDealers.co.za This test is not yet approved or cleared by the [place] FDA and has been authorized for detection and/or diagnosis of SARS-CoV-2 by FDA under an Emergency Use Authorization (EUA).  This EUA will remain in effect (meaning this test can be  used) for the duration of the COVID-19 declaration under Section 564(b)(1) of the Act, 21 U.S.C. section 360bbb-3(b)(1), unless the authorization is terminated or revoked sooner. Performed at [facility], [place] [address], [place], [place] [address]          Radiology Studies: No results found.      Scheduled Meds: . irbesartan  300 mg Oral Daily   And  . amLODipine  5 mg Oral Daily  . bisacodyl  10 mg Rectal Once  . citalopram  20 mg Oral Daily  . cycloSPORINE  1 drop Both Eyes Q12H  . feeding supplement  1 Container Oral BID BM  . feeding supplement (PRO-STAT SUGAR FREE 64)  30 mL Oral BID  . heparin  5,000 Units Subcutaneous Q8H  . hydrochlorothiazide  25 mg Oral Daily  . latanoprost  1 drop Both Eyes QHS  . multivitamin with minerals  1 tablet Oral Daily  . senna  1 tablet Oral BID  . vitamin C  250 mg Oral Daily   Continuous Infusions:   LOS: 6 days        Aline August, MD  [facility] 07/20/2018, 7:23 AM

## 2018-07-20 NOTE — Progress Notes (Signed)
Physical Therapy Treatment Patient Details Name: Brianna Hunter MRN: [id number] DOB: [birth year] Today's Date: 07/20/2018    History of Present Illness Pt is an 85 y/o F admitted on 07/13/18 after 2 falls at home. Afib with RVR resolved in ED, dehydration. PMH includes breast CA, anxiety, arthritis, HLD, HTN, Lt TKA, dementia.    PT Comments    Pt in recliner AxO self .  Stated she was having a little pain L LE but unable to specify location.  Assisted out of recliner to [facility].  Pt was able to stand with assist but then seated pt had a syncopal episode, non responsive and lethargic.  Emergency button used to call RN.  @ nurses arrived and quickly assisted pt back to bed.   Pt remained unresponsive, sternal rub performed to achieve a slight response.  Vitals taken BP 162/69, HR 67.  Became aroused after several min.    Follow Up Recommendations  SNF;Supervision/Assistance - 24 hour     Equipment Recommendations       Recommendations for Other Services       Precautions / Restrictions Precautions Precautions: Fall Precaution Comments: blind left eye Restrictions Weight Bearing Restrictions: No    Mobility  Bed Mobility Overal bed mobility: Needs Assistance Bed Mobility: Sit to Supine     Supine to sit: Total assist;+2 for physical assistance;+2 for safety/equipment     General bed mobility comments: Total Assist back to bed   Transfers Overall transfer level: Needs assistance Equipment used: None Transfers: Sit to/from [employer] Transfers   Stand pivot transfers: Mod assist;Max assist       General transfer comment: assisted from recliner to [facility] 1/4 turn with hand over hand assist for direction and turn completion  Ambulation/Gait             General Gait Details: unable   Stairs             Wheelchair Mobility    Modified Rankin (Stroke Patients Only)       Balance                                             Cognition Arousal/Alertness: Awake/alert Behavior During Therapy: Flat affect Overall Cognitive Status: No family/caregiver present to determine baseline cognitive functioning                                 General Comments: pt alert following functional commands      Exercises      General Comments        Pertinent Vitals/Pain Pain Assessment: Faces Faces Pain Scale: Hurts a little bit Pain Location: L LE Pain Descriptors / Indicators: Grimacing;Moaning Pain Intervention(s): Monitored during session    Home Living                      Prior Function            PT Goals (current goals can now be found in the care plan section) Progress towards PT goals: Progressing toward goals    Frequency    Min 3X/week      PT Plan Current plan remains appropriate    Co-evaluation              AM-PAC PT "6 Clicks" Mobility   Outcome  Measure  Help needed turning from your back to your side while in a flat bed without using bedrails?: A Lot Help needed moving from lying on your back to sitting on the side of a flat bed without using bedrails?: A Lot Help needed moving to and from a bed to a chair (including a wheelchair)?: A Lot Help needed standing up from a chair using your arms (e.g., wheelchair or bedside chair)?: A Lot Help needed to walk in hospital room?: Total Help needed climbing 3-5 steps with a railing? : Total 6 Click Score: 10    End of Session Equipment Utilized During Treatment: Gait belt Activity Tolerance: Other (comment)(syncope episode) Patient left: in bed;with call bell/phone within reach;with nursing/sitter in room Nurse Communication: Mobility status(red emergency button activited ) PT Visit Diagnosis: Other abnormalities of gait and mobility (R26.89);Muscle weakness (generalized) (M62.81);History of falling (Z91.81);Repeated falls (R29.6);Difficulty in walking, not elsewhere classified (R26.2);Unsteadiness on feet  (R26.81)     Time: 1345-1410 PT Time Calculation (min) (ACUTE ONLY): 25 min  Charges:  $Therapeutic Activity: 23-37 mins                     Rica Koyanagi  PTA Acute  Rehabilitation Services Pager      [phone] Office      [phone]

## 2018-07-20 NOTE — TOC Transition Note (Addendum)
Transition of Care [facility]) - CM/SW Discharge Note **DISCHARGE TO [facility]   Patient Details  Name: Ranette Luckadoo MRN: [id number] Date of Birth: [birth year]  Transition of Care [facility]) CM/SW Contact:  Sable Feil, LCSW Phone Number: 07/20/2018, 2:12 PM   Clinical Narrative:  CSW advised by [name], admissions director at [facility] that authorization received. MD and daughter informed. Daughter, May Tarnowski also informed regarding ambulance transport.   Final next level of care: Skilled Nursing [facility]) Barriers to Discharge: No Barriers Identified, Insurance Authorization(Insurance Auth received today-5/11)   Patient Goals and CMS Choice Patient states their goals for this hospitalization and ongoing recovery are:: Famiy desires patient to get ST rehab CMS Medicare.gov Compare Post Acute Care list provided to:: Patient Represenative (must comment)(Daughter May Satchell) Choice offered to / list presented to : Adult Children  Discharge Placement   Existing PASRR number confirmed : 07/16/18          Patient chooses bed at: [facility] Patient to be transferred to facility by: Ambulance Name of family member notified: May Basso - [phone] - daughter Patient and family notified of of transfer: 07/20/18  Discharge Plan and Services  Patient discharging on Monday, 5/11 to [facility] for short-term rehab.                                   Social Determinants of Health (SDOH) Interventions  No SDOH interventions needed/requested at this time   Readmission Risk Interventions No flowsheet data found.

## 2018-07-22 DIAGNOSIS — E876 Hypokalemia: Secondary | ICD-10-CM | POA: Diagnosis not present

## 2018-07-22 DIAGNOSIS — M6281 Muscle weakness (generalized): Secondary | ICD-10-CM | POA: Diagnosis not present

## 2018-07-22 DIAGNOSIS — I4891 Unspecified atrial fibrillation: Secondary | ICD-10-CM | POA: Diagnosis not present

## 2018-07-22 DIAGNOSIS — N179 Acute kidney failure, unspecified: Secondary | ICD-10-CM | POA: Diagnosis not present

## 2018-07-23 DIAGNOSIS — D508 Other iron deficiency anemias: Secondary | ICD-10-CM | POA: Diagnosis not present

## 2018-07-23 DIAGNOSIS — F0390 Unspecified dementia without behavioral disturbance: Secondary | ICD-10-CM | POA: Diagnosis not present

## 2018-07-23 DIAGNOSIS — E43 Unspecified severe protein-calorie malnutrition: Secondary | ICD-10-CM | POA: Diagnosis not present

## 2018-07-23 DIAGNOSIS — I10 Essential (primary) hypertension: Secondary | ICD-10-CM | POA: Diagnosis not present

## 2018-07-29 DIAGNOSIS — R7989 Other specified abnormal findings of blood chemistry: Secondary | ICD-10-CM | POA: Diagnosis not present

## 2018-07-29 DIAGNOSIS — E876 Hypokalemia: Secondary | ICD-10-CM | POA: Diagnosis not present

## 2018-07-29 DIAGNOSIS — N179 Acute kidney failure, unspecified: Secondary | ICD-10-CM | POA: Diagnosis not present

## 2018-07-29 DIAGNOSIS — M6281 Muscle weakness (generalized): Secondary | ICD-10-CM | POA: Diagnosis not present

## 2018-07-30 DIAGNOSIS — M6281 Muscle weakness (generalized): Secondary | ICD-10-CM | POA: Diagnosis not present

## 2018-07-30 DIAGNOSIS — I2699 Other pulmonary embolism without acute cor pulmonale: Secondary | ICD-10-CM | POA: Diagnosis not present

## 2018-07-30 DIAGNOSIS — M1990 Unspecified osteoarthritis, unspecified site: Secondary | ICD-10-CM | POA: Diagnosis not present

## 2018-07-30 DIAGNOSIS — E43 Unspecified severe protein-calorie malnutrition: Secondary | ICD-10-CM | POA: Diagnosis not present

## 2018-08-05 DIAGNOSIS — E876 Hypokalemia: Secondary | ICD-10-CM | POA: Diagnosis not present

## 2018-08-05 DIAGNOSIS — R7989 Other specified abnormal findings of blood chemistry: Secondary | ICD-10-CM | POA: Diagnosis not present

## 2018-08-05 DIAGNOSIS — N179 Acute kidney failure, unspecified: Secondary | ICD-10-CM | POA: Diagnosis not present

## 2018-08-05 DIAGNOSIS — M6281 Muscle weakness (generalized): Secondary | ICD-10-CM | POA: Diagnosis not present

## 2018-08-10 DIAGNOSIS — I10 Essential (primary) hypertension: Secondary | ICD-10-CM | POA: Diagnosis not present

## 2018-08-10 DIAGNOSIS — E876 Hypokalemia: Secondary | ICD-10-CM | POA: Diagnosis not present

## 2018-08-10 DIAGNOSIS — M6281 Muscle weakness (generalized): Secondary | ICD-10-CM | POA: Diagnosis not present

## 2018-08-10 DIAGNOSIS — N179 Acute kidney failure, unspecified: Secondary | ICD-10-CM | POA: Diagnosis not present

## 2018-08-12 ENCOUNTER — Other Ambulatory Visit: Payer: Self-pay | Admitting: *Deleted

## 2018-08-12 NOTE — Patient Outreach (Signed)
  [facility]) Care Management  08/12/2018  Brianna Hunter [birth year] [id number]   Transition of Care Referral   Referral Date: 08/11/18 Referral Source:  [facility] inpatient referral  Date of Admission: admitted to snf on 07/20/18 from hospital  Diagnosis: falls x 2, new onset of paroxysmal Atrial fibrillation with RVR, AKI, Covid 19 negative,   Date of Discharge: Facility: [facility] on 08/10/18 Insurance: [place] hospitalization 07/13/18 to 07/20/18 - [facility]   Conditions: 2 Fall (mechanical)  on 07/13/18, hypotensive, new onset of paroxysmal Atrial fibrillation with RVR, AKI, Covid 19 negative,  left breast DCIS status post lumpectomy treated with tamoxifen, , HTN, HLD, dementia, anxiety, valvular heart disease, bifascicular block, Microcytic anemia,     Outreach attempt # 1 unsuccessful x 2 home and mobile numbers No answer on home number ?invalid number No answer on mobile number. [facility] RN CM left HIPAA compliant voicemail message along with CM's contact info.   Plan: [facility] RN CM sent an unsuccessful outreach letter and scheduled this patient for another call attempt within 4 business days  Kimberly L. Lavina Hamman, RN, BSN, [place] Management Care Coordinator Direct Number [phone] Mobile number [phone]  Main [facility] number [phone] Fax number [phone]

## 2018-08-13 ENCOUNTER — Other Ambulatory Visit: Payer: Self-pay | Admitting: *Deleted

## 2018-08-13 NOTE — Patient Outreach (Signed)
[facility]) Care Management  08/13/2018  Brianna Hunter 02/07/1929 [id number]   Transition of Care Referral  Referral Date: 08/11/18 Referral Source:  [facility] inpatient referral  Date of Admission: admitted to snf on 07/20/18 from hospital  Diagnosis: falls x 2, new onset of paroxysmal Atrial fibrillation with RVR, AKI, Covid 19 negative,   Date of Discharge: Facility: [facility] on 08/10/18 Insurance: [place] hospitalization 07/13/18 to 07/20/18 - [facility]   Conditions: 2 Fall (mechanical)  on 07/13/18, hypotensive, new onset of paroxysmal Atrial fibrillation with RVR, AKI, Covid 19 negative,  left breast DCIS status post lumpectomy treated with tamoxifen, , HTN, HLD, dementia, anxiety, valvular heart disease, bifascicular block, Microcytic anemia,     Outreach attempt # 2 unsuccessful to mobile number  No answer on mobile number. [facility] RN CM left HIPAA compliant voicemail message along with CM's contact info.   Plan: [facility] RN CM sent an unsuccessful outreach letter on 08/12/18  CM left voice messages on 08/12/18 and 08/13/18  [facility] RN CM scheduled this patient for a final call attempt within 4 business days  [place] L. Lavina Hamman, RN, BSN, [place] Management Care Coordinator Direct Number [phone] Mobile number [phone]  Main [facility] number [phone] Fax number [phone]

## 2018-08-14 ENCOUNTER — Other Ambulatory Visit: Payer: Self-pay

## 2018-08-14 ENCOUNTER — Other Ambulatory Visit: Payer: Self-pay | Admitting: *Deleted

## 2018-08-14 DIAGNOSIS — D649 Anemia, unspecified: Secondary | ICD-10-CM | POA: Diagnosis not present

## 2018-08-14 DIAGNOSIS — I10 Essential (primary) hypertension: Secondary | ICD-10-CM | POA: Diagnosis not present

## 2018-08-14 DIAGNOSIS — E46 Unspecified protein-calorie malnutrition: Secondary | ICD-10-CM | POA: Diagnosis not present

## 2018-08-14 DIAGNOSIS — H44513 Absolute glaucoma, bilateral: Secondary | ICD-10-CM | POA: Diagnosis not present

## 2018-08-14 DIAGNOSIS — I480 Paroxysmal atrial fibrillation: Secondary | ICD-10-CM | POA: Diagnosis not present

## 2018-08-14 DIAGNOSIS — Z9181 History of falling: Secondary | ICD-10-CM | POA: Diagnosis not present

## 2018-08-14 DIAGNOSIS — F0390 Unspecified dementia without behavioral disturbance: Secondary | ICD-10-CM | POA: Diagnosis not present

## 2018-08-14 DIAGNOSIS — F419 Anxiety disorder, unspecified: Secondary | ICD-10-CM | POA: Diagnosis not present

## 2018-08-14 DIAGNOSIS — E785 Hyperlipidemia, unspecified: Secondary | ICD-10-CM | POA: Diagnosis not present

## 2018-08-14 NOTE — Patient Outreach (Addendum)
[facility]) Care Management  08/14/2018  Brianna Hunter May 01, 1928 [id number]   Transition of Care Referral  Referral Date:08/11/18 Referral Source:[employer] inpatient referral  Date of Admission:admitted to snf on 07/20/18 from hospital Diagnosis:falls x 2, new onset of paroxysmal Atrial fibrillation with RVR, AKI, Covid 19 negative, Date of Discharge:Facility: [facility] on 08/10/18 Insurance:[employer] medicare Last hospitalization5/4/20 to 07/20/18 - [place] attempt #3  unsuccessful to mobile number  No answer on mobile number. [facility] RN CM left HIPAA compliant voicemail message along with CM's contact info.   Outreach attempt# 3 B  With this being the final call attempts [facility] RN CM reached out to the listed number for her emergency contact, her daughter, Brianna Hunter [facility] RN CM introduced herself, stated the purpose of the call and requested to speak with the patient. Brianna Hunter reports she informed someone possibly at the snf to use Brianna Hunter's home number. Brianna Hunter states Brianna Hunter would not answer her mobile number and at this time "would not understand anyway" CM clarified that CM was not from [place] center but [facility] and explained [facility] and purpose of Transition of care call. CM discussed with Brianna Hunter that [facility] RN CM would update Epic for [facility] system related to the temporarily telephone contact at this time. Brianna Hunter voiced appreciation  Brianna Hunter  is able to verify partial HIPAA, (pt name and DOB but states she is getting her and the pt ready for an appointment at 1200 today and wanted to either call CM back or [facility] main office. She confirms seeing CM contact number on her phone and states she will return a call   Consent: [place] reviewed [facility] services with Brianna Hunter, daughter.  Brianna Hunter gave verbal consent for services. Advised Brianna Hunter that other post discharge calls Brianna Hunter occur to assess how the patient is doing following the recent hospitalization. Brianna Hunter voiced understanding and was appreciative of  f/u call.   Brianna Hunter does confirms pt is "doing well" and is being seen by home health Visits have already started. Brianna Hunter reports she does check Brianna Hunter mail.    Social: Brianna Hunter is staying with her daughter, Brianna Hunter at this time temporary. 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 If not call within 4 business days CM will plan for case closure per [facility] call attempt workflow  [facility] RN CM has sent an unsuccessful outreach letter on 08/12/18 and left voice messages on 08/12/18, 08/13/18 and 08/14/18   Routed note to MD   Brianna Hunter encouraged to return a call to [place] CM prn  [facility] RN CM sent a successful outreach letter as discussed with [facility] brochure enclosed for review   Brianna Klammer L. Lavina Hamman, RN, BSN, [place] Management Care Coordinator Direct Number [phone] Mobile number [phone]  Main [facility] number [phone] Fax number [phone]

## 2018-08-17 DIAGNOSIS — E039 Hypothyroidism, unspecified: Secondary | ICD-10-CM | POA: Diagnosis not present

## 2018-08-17 DIAGNOSIS — R627 Adult failure to thrive: Secondary | ICD-10-CM | POA: Diagnosis not present

## 2018-08-17 DIAGNOSIS — F0150 Vascular dementia without behavioral disturbance: Secondary | ICD-10-CM | POA: Diagnosis not present

## 2018-08-18 DIAGNOSIS — Z9181 History of falling: Secondary | ICD-10-CM | POA: Diagnosis not present

## 2018-08-18 DIAGNOSIS — F419 Anxiety disorder, unspecified: Secondary | ICD-10-CM | POA: Diagnosis not present

## 2018-08-18 DIAGNOSIS — I480 Paroxysmal atrial fibrillation: Secondary | ICD-10-CM | POA: Diagnosis not present

## 2018-08-18 DIAGNOSIS — E785 Hyperlipidemia, unspecified: Secondary | ICD-10-CM | POA: Diagnosis not present

## 2018-08-18 DIAGNOSIS — H44513 Absolute glaucoma, bilateral: Secondary | ICD-10-CM | POA: Diagnosis not present

## 2018-08-18 DIAGNOSIS — D649 Anemia, unspecified: Secondary | ICD-10-CM | POA: Diagnosis not present

## 2018-08-18 DIAGNOSIS — E46 Unspecified protein-calorie malnutrition: Secondary | ICD-10-CM | POA: Diagnosis not present

## 2018-08-18 DIAGNOSIS — F0390 Unspecified dementia without behavioral disturbance: Secondary | ICD-10-CM | POA: Diagnosis not present

## 2018-08-18 DIAGNOSIS — I10 Essential (primary) hypertension: Secondary | ICD-10-CM | POA: Diagnosis not present

## 2018-08-19 ENCOUNTER — Other Ambulatory Visit: Payer: Self-pay | Admitting: *Deleted

## 2018-08-19 NOTE — Patient Outreach (Signed)
Brianna Hunter) Care Management  08/19/2018  Brianna Hunter [birth year] [id number]   Care coordination  [facility] RN CM has not received a return call from [name] as May stated [facility] RN CM has sent an unsuccessful outreach letter on 08/12/18 and left voice messages on 08/12/18, 08/13/18 and 08/14/18  In a breif conversation with May daughter on 08/14/18 she stated she would return a call to [facility] RM CM. May was assisting the pt to get ready for an appointment May did confirm [name] was "doing well" and is being seen by home health Visits have already started. May reports she does check [name] mail  Outreach attempt No answer. [facility] RN CM left HIPAA compliant voicemail message along with CM's contact info.   Plan: [facility] RN CM scheduled this patient for case closure per [facility]  call attempt workflow within 5 business days   Amiyah Shryock L. Lavina Hamman, RN, BSN, [place] Coordinator Office number [phone] Mobile number [phone]  Main [facility] number [phone] Fax number [phone]

## 2018-08-21 DIAGNOSIS — F419 Anxiety disorder, unspecified: Secondary | ICD-10-CM | POA: Diagnosis not present

## 2018-08-21 DIAGNOSIS — I10 Essential (primary) hypertension: Secondary | ICD-10-CM | POA: Diagnosis not present

## 2018-08-21 DIAGNOSIS — H44513 Absolute glaucoma, bilateral: Secondary | ICD-10-CM | POA: Diagnosis not present

## 2018-08-21 DIAGNOSIS — E785 Hyperlipidemia, unspecified: Secondary | ICD-10-CM | POA: Diagnosis not present

## 2018-08-21 DIAGNOSIS — E46 Unspecified protein-calorie malnutrition: Secondary | ICD-10-CM | POA: Diagnosis not present

## 2018-08-21 DIAGNOSIS — D649 Anemia, unspecified: Secondary | ICD-10-CM | POA: Diagnosis not present

## 2018-08-21 DIAGNOSIS — F0390 Unspecified dementia without behavioral disturbance: Secondary | ICD-10-CM | POA: Diagnosis not present

## 2018-08-21 DIAGNOSIS — Z9181 History of falling: Secondary | ICD-10-CM | POA: Diagnosis not present

## 2018-08-21 DIAGNOSIS — I480 Paroxysmal atrial fibrillation: Secondary | ICD-10-CM | POA: Diagnosis not present

## 2018-08-26 ENCOUNTER — Other Ambulatory Visit: Payer: Self-pay | Admitting: *Deleted

## 2018-08-26 NOTE — Patient Outreach (Signed)
[facility]) Care Management  08/26/2018  Brianna Hunter 1929-02-13 [id number]   Case closure   [facility] RN CM has sent an unsuccessful outreach letter on 08/12/18 and left voice messages on 08/12/18, 08/13/18 and 08/14/18  In a breif conversation with May daughter on 08/14/18 she stated she would return a call to [facility] RM CM There has not been any return calls to [place] RN CM will close case after no response from patient or her daughter within 51 business days. Unable to reach, maintain contact with patient  Case closure letters sent to patient and MD  Joelene Millin L. Lavina Hamman, RN, BSN, [place] Coordinator Office number [phone] Mobile number [phone]  Main [facility] number [phone] Fax number [phone]

## 2018-09-01 DIAGNOSIS — I480 Paroxysmal atrial fibrillation: Secondary | ICD-10-CM | POA: Diagnosis not present

## 2018-09-01 DIAGNOSIS — D649 Anemia, unspecified: Secondary | ICD-10-CM | POA: Diagnosis not present

## 2018-09-01 DIAGNOSIS — H44513 Absolute glaucoma, bilateral: Secondary | ICD-10-CM | POA: Diagnosis not present

## 2018-09-01 DIAGNOSIS — I10 Essential (primary) hypertension: Secondary | ICD-10-CM | POA: Diagnosis not present

## 2018-09-01 DIAGNOSIS — E46 Unspecified protein-calorie malnutrition: Secondary | ICD-10-CM | POA: Diagnosis not present

## 2018-09-01 DIAGNOSIS — F419 Anxiety disorder, unspecified: Secondary | ICD-10-CM | POA: Diagnosis not present

## 2018-09-01 DIAGNOSIS — F0390 Unspecified dementia without behavioral disturbance: Secondary | ICD-10-CM | POA: Diagnosis not present

## 2018-09-01 DIAGNOSIS — E785 Hyperlipidemia, unspecified: Secondary | ICD-10-CM | POA: Diagnosis not present

## 2018-09-01 DIAGNOSIS — Z9181 History of falling: Secondary | ICD-10-CM | POA: Diagnosis not present

## 2018-09-03 DIAGNOSIS — F0390 Unspecified dementia without behavioral disturbance: Secondary | ICD-10-CM | POA: Diagnosis not present

## 2018-09-03 DIAGNOSIS — D649 Anemia, unspecified: Secondary | ICD-10-CM | POA: Diagnosis not present

## 2018-09-03 DIAGNOSIS — E46 Unspecified protein-calorie malnutrition: Secondary | ICD-10-CM | POA: Diagnosis not present

## 2018-09-03 DIAGNOSIS — Z9181 History of falling: Secondary | ICD-10-CM | POA: Diagnosis not present

## 2018-09-03 DIAGNOSIS — F419 Anxiety disorder, unspecified: Secondary | ICD-10-CM | POA: Diagnosis not present

## 2018-09-03 DIAGNOSIS — E785 Hyperlipidemia, unspecified: Secondary | ICD-10-CM | POA: Diagnosis not present

## 2018-09-03 DIAGNOSIS — I10 Essential (primary) hypertension: Secondary | ICD-10-CM | POA: Diagnosis not present

## 2018-09-03 DIAGNOSIS — H44513 Absolute glaucoma, bilateral: Secondary | ICD-10-CM | POA: Diagnosis not present

## 2018-09-03 DIAGNOSIS — I480 Paroxysmal atrial fibrillation: Secondary | ICD-10-CM | POA: Diagnosis not present

## 2018-09-27 ENCOUNTER — Inpatient Hospital Stay (HOSPITAL_COMMUNITY)
Admission: EM | Admit: 2018-09-27 | Discharge: 2018-09-30 | DRG: 175 | Disposition: A | Discharge status: Home Health | Payer: Medicare HMO | Attending: Internal Medicine | Admitting: Internal Medicine

## 2018-09-27 ENCOUNTER — Other Ambulatory Visit: Payer: Self-pay

## 2018-09-27 ENCOUNTER — Emergency Department (HOSPITAL_COMMUNITY): Payer: Medicare HMO

## 2018-09-27 ENCOUNTER — Encounter (HOSPITAL_COMMUNITY): Payer: Self-pay | Admitting: Radiology

## 2018-09-27 DIAGNOSIS — R0902 Hypoxemia: Secondary | ICD-10-CM | POA: Diagnosis not present

## 2018-09-27 DIAGNOSIS — R404 Transient alteration of awareness: Secondary | ICD-10-CM | POA: Diagnosis not present

## 2018-09-27 DIAGNOSIS — Z90710 Acquired absence of both cervix and uterus: Secondary | ICD-10-CM

## 2018-09-27 DIAGNOSIS — Z86000 Personal history of in-situ neoplasm of breast: Secondary | ICD-10-CM | POA: Diagnosis not present

## 2018-09-27 DIAGNOSIS — I313 Pericardial effusion (noninflammatory): Secondary | ICD-10-CM | POA: Diagnosis not present

## 2018-09-27 DIAGNOSIS — I361 Nonrheumatic tricuspid (valve) insufficiency: Secondary | ICD-10-CM | POA: Diagnosis not present

## 2018-09-27 DIAGNOSIS — I351 Nonrheumatic aortic (valve) insufficiency: Secondary | ICD-10-CM | POA: Diagnosis present

## 2018-09-27 DIAGNOSIS — G9341 Metabolic encephalopathy: Secondary | ICD-10-CM | POA: Diagnosis present

## 2018-09-27 DIAGNOSIS — I480 Paroxysmal atrial fibrillation: Secondary | ICD-10-CM | POA: Diagnosis not present

## 2018-09-27 DIAGNOSIS — I4891 Unspecified atrial fibrillation: Secondary | ICD-10-CM | POA: Diagnosis not present

## 2018-09-27 DIAGNOSIS — I2699 Other pulmonary embolism without acute cor pulmonale: Secondary | ICD-10-CM | POA: Diagnosis not present

## 2018-09-27 DIAGNOSIS — E785 Hyperlipidemia, unspecified: Secondary | ICD-10-CM | POA: Diagnosis present

## 2018-09-27 DIAGNOSIS — Z1159 Encounter for screening for other viral diseases: Secondary | ICD-10-CM

## 2018-09-27 DIAGNOSIS — F0390 Unspecified dementia without behavioral disturbance: Secondary | ICD-10-CM | POA: Diagnosis present

## 2018-09-27 DIAGNOSIS — D509 Iron deficiency anemia, unspecified: Secondary | ICD-10-CM | POA: Diagnosis present

## 2018-09-27 DIAGNOSIS — M790 Rheumatism, unspecified: Secondary | ICD-10-CM | POA: Diagnosis present

## 2018-09-27 DIAGNOSIS — D696 Thrombocytopenia, unspecified: Secondary | ICD-10-CM | POA: Diagnosis present

## 2018-09-27 DIAGNOSIS — Z91011 Allergy to milk products: Secondary | ICD-10-CM | POA: Diagnosis not present

## 2018-09-27 DIAGNOSIS — Z20828 Contact with and (suspected) exposure to other viral communicable diseases: Secondary | ICD-10-CM | POA: Diagnosis not present

## 2018-09-27 DIAGNOSIS — I2609 Other pulmonary embolism with acute cor pulmonale: Secondary | ICD-10-CM

## 2018-09-27 DIAGNOSIS — Z880 Allergy status to penicillin: Secondary | ICD-10-CM

## 2018-09-27 DIAGNOSIS — R0602 Shortness of breath: Secondary | ICD-10-CM | POA: Diagnosis not present

## 2018-09-27 DIAGNOSIS — I10 Essential (primary) hypertension: Secondary | ICD-10-CM | POA: Diagnosis present

## 2018-09-27 DIAGNOSIS — E876 Hypokalemia: Secondary | ICD-10-CM | POA: Diagnosis not present

## 2018-09-27 DIAGNOSIS — R079 Chest pain, unspecified: Secondary | ICD-10-CM | POA: Diagnosis not present

## 2018-09-27 DIAGNOSIS — R0789 Other chest pain: Secondary | ICD-10-CM | POA: Diagnosis not present

## 2018-09-27 DIAGNOSIS — I340 Nonrheumatic mitral (valve) insufficiency: Secondary | ICD-10-CM | POA: Diagnosis not present

## 2018-09-27 DIAGNOSIS — R4020 Unspecified coma: Secondary | ICD-10-CM | POA: Diagnosis not present

## 2018-09-27 DIAGNOSIS — D638 Anemia in other chronic diseases classified elsewhere: Secondary | ICD-10-CM | POA: Diagnosis not present

## 2018-09-27 DIAGNOSIS — R4189 Other symptoms and signs involving cognitive functions and awareness: Secondary | ICD-10-CM | POA: Diagnosis not present

## 2018-09-27 LAB — COMPREHENSIVE METABOLIC PANEL
ALT: 19 U/L (ref 0–44)
AST: 22 U/L (ref 15–41)
Albumin: 2.5 g/dL — ABNORMAL LOW (ref 3.5–5.0)
Alkaline Phosphatase: 48 U/L (ref 38–126)
Anion gap: 8 (ref 5–15)
BUN: 15 mg/dL (ref 8–23)
CO2: 24 mmol/L (ref 22–32)
Calcium: 8.8 mg/dL — ABNORMAL LOW (ref 8.9–10.3)
Chloride: 111 mmol/L (ref 98–111)
Creatinine, Ser: 0.83 mg/dL (ref 0.44–1.00)
GFR calc Af Amer: >60 mL/min (ref >60)
GFR calc non Af Amer: >60 mL/min (ref >60)
Glucose, Bld: 136 mg/dL — ABNORMAL HIGH (ref 70–99)
Potassium: 3.9 mmol/L (ref 3.5–5.1)
Sodium: 143 mmol/L (ref 135–145)
Total Bilirubin: 0.6 mg/dL (ref 0.3–1.2)
Total Protein: 6.3 g/dL — ABNORMAL LOW (ref 6.5–8.1)

## 2018-09-27 LAB — URINALYSIS, ROUTINE W REFLEX MICROSCOPIC
Bacteria, UA: NONE SEEN
Bilirubin Urine: NEGATIVE
Glucose, UA: NEGATIVE mg/dL
Ketones, ur: NEGATIVE mg/dL
Leukocytes,Ua: NEGATIVE
Nitrite: NEGATIVE
Protein, ur: 30 mg/dL — AB
Specific Gravity, Urine: 1.018 (ref 1.005–1.030)
pH: 6 (ref 5.0–8.0)

## 2018-09-27 LAB — TROPONIN I (HIGH SENSITIVITY)
Troponin I (High Sensitivity): 32 ng/L — ABNORMAL HIGH (ref <18)
Troponin I (High Sensitivity): 74 ng/L — ABNORMAL HIGH (ref <18)

## 2018-09-27 LAB — MRSA PCR SCREENING: MRSA by PCR: NEGATIVE

## 2018-09-27 LAB — SARS CORONAVIRUS 2 BY RT PCR (HOSPITAL ORDER, PERFORMED IN ~~LOC~~ HOSPITAL LAB): SARS Coronavirus 2: NEGATIVE

## 2018-09-27 LAB — CBC
HCT: 29.8 % — ABNORMAL LOW (ref 36.0–46.0)
Hemoglobin: 8.9 g/dL — ABNORMAL LOW (ref 12.0–15.0)
MCH: 22.5 pg — ABNORMAL LOW (ref 26.0–34.0)
MCHC: 29.9 g/dL — ABNORMAL LOW (ref 30.0–36.0)
MCV: 75.4 fL — ABNORMAL LOW (ref 80.0–100.0)
Platelets: 139 10*3/uL — ABNORMAL LOW (ref 150–400)
RBC: 3.95 MIL/uL (ref 3.87–5.11)
RDW: 15 % (ref 11.5–15.5)
WBC: 6.2 10*3/uL (ref 4.0–10.5)
nRBC: 0 % (ref 0.0–0.2)

## 2018-09-27 LAB — D-DIMER, QUANTITATIVE: D-Dimer, Quant: >20 ug/mL-FEU — ABNORMAL HIGH (ref 0.00–0.50)

## 2018-09-27 LAB — HEPARIN LEVEL (UNFRACTIONATED): Heparin Unfractionated: 0.35 IU/mL (ref 0.30–0.70)

## 2018-09-27 MED ORDER — SODIUM CHLORIDE 0.9 % IV BOLUS
1000.0000 mL | Freq: Once | INTRAVENOUS | Status: AC
  Administered 2018-09-27: 1000 mL via INTRAVENOUS

## 2018-09-27 MED ORDER — HYDROCHLOROTHIAZIDE 25 MG PO TABS
25.0000 mg | ORAL_TABLET | Freq: Every day | ORAL | Status: DC
  Administered 2018-09-27 – 2018-09-30 (×4): 25 mg via ORAL
  Filled 2018-09-27 (×5): qty 1

## 2018-09-27 MED ORDER — HEPARIN (PORCINE) 25000 UT/250ML-% IV SOLN
800.0000 [IU]/h | INTRAVENOUS | Status: DC
  Administered 2018-09-27 – 2018-09-28 (×2): 800 [IU]/h via INTRAVENOUS
  Filled 2018-09-27 (×2): qty 250

## 2018-09-27 MED ORDER — CYCLOSPORINE 0.05 % OP EMUL
1.0000 [drp] | Freq: Two times a day (BID) | OPHTHALMIC | Status: DC
  Administered 2018-09-27 – 2018-09-30 (×6): 1 [drp] via OPHTHALMIC
  Filled 2018-09-27 (×7): qty 30

## 2018-09-27 MED ORDER — VITAMIN C 500 MG PO TABS
250.0000 mg | ORAL_TABLET | Freq: Every day | ORAL | Status: DC
  Administered 2018-09-27 – 2018-09-30 (×4): 250 mg via ORAL
  Filled 2018-09-27 (×4): qty 1

## 2018-09-27 MED ORDER — PANTOPRAZOLE SODIUM 40 MG PO TBEC
40.0000 mg | DELAYED_RELEASE_TABLET | Freq: Every day | ORAL | Status: DC
  Administered 2018-09-27 – 2018-09-30 (×4): 40 mg via ORAL
  Filled 2018-09-27 (×4): qty 1

## 2018-09-27 MED ORDER — HEPARIN BOLUS VIA INFUSION
2500.0000 [IU] | Freq: Once | INTRAVENOUS | Status: AC
  Administered 2018-09-27: 2500 [IU] via INTRAVENOUS
  Filled 2018-09-27: qty 2500

## 2018-09-27 MED ORDER — LATANOPROST 0.005 % OP SOLN
1.0000 [drp] | Freq: Every day | OPHTHALMIC | Status: DC
  Administered 2018-09-27 – 2018-09-29 (×3): 1 [drp] via OPHTHALMIC
  Filled 2018-09-27: qty 2.5

## 2018-09-27 MED ORDER — AMLODIPINE BESYLATE 5 MG PO TABS
5.0000 mg | ORAL_TABLET | Freq: Every day | ORAL | Status: DC
  Administered 2018-09-27 – 2018-09-30 (×4): 5 mg via ORAL
  Filled 2018-09-27 (×5): qty 1

## 2018-09-27 MED ORDER — IOHEXOL 350 MG/ML SOLN
100.0000 mL | Freq: Once | INTRAVENOUS | Status: AC | PRN
  Administered 2018-09-27: 90 mL via INTRAVENOUS

## 2018-09-27 MED ORDER — IRBESARTAN 150 MG PO TABS
300.0000 mg | ORAL_TABLET | Freq: Every day | ORAL | Status: DC
  Administered 2018-09-27 – 2018-09-30 (×4): 300 mg via ORAL
  Filled 2018-09-27 (×6): qty 2

## 2018-09-27 MED ORDER — CITALOPRAM HYDROBROMIDE 20 MG PO TABS
20.0000 mg | ORAL_TABLET | Freq: Every day | ORAL | Status: DC
  Administered 2018-09-27 – 2018-09-30 (×4): 20 mg via ORAL
  Filled 2018-09-27 (×4): qty 1

## 2018-09-27 MED ORDER — OLMESARTAN-AMLODIPINE-HCTZ 40-5-25 MG PO TABS: 1.0000 | ORAL_TABLET | Freq: Every day | ORAL | Status: DC

## 2018-09-27 MED ORDER — ACETAMINOPHEN 500 MG PO TABS: 1000.0000 mg | ORAL_TABLET | Freq: Four times a day (QID) | ORAL | Status: DC | PRN

## 2018-09-27 NOTE — ED Notes (Signed)
Patient transported to CT 

## 2018-09-27 NOTE — Progress Notes (Signed)
ANTICOAGULATION CONSULT NOTE - Initial Consult  Pharmacy Consult for heparin Indication: pulmonary embolus  Allergies  Allergen Reactions  . Milk-Related Compounds Other (See Comments)    Frequent bowel movements   . Augmentin [Amoxicillin-Pot Clavulanate] Diarrhea    Patient Measurements: Height: 4\' 9"  (144.8 cm) Weight: 110 lb 3.7 oz (50 kg) IBW/kg (Calculated) : 38.6 Heparin Dosing Weight: 48.8  Vital Signs: Temp: 97.8 F (36.6 C) (07/19 0800) Temp Source: Oral (07/19 0800) BP: 120/82 (07/19 1330) Pulse Rate: 80 (07/19 1330)  Labs: Recent Labs    09/27/18 0904 09/27/18 1040  HGB 8.9*  --   HCT 29.8*  --   PLT 139*  --   CREATININE 0.83  --   TROPONINIHS 32* 74*    Estimated Creatinine Clearance: 32 mL/min (by C-G formula based on SCr of 0.83 mg/dL).   Medical History: Past Medical History:  Diagnosis Date  . Anxiety   . Arthritis    celebrex use off & on for arthritis- hands, back   . Back pain   . Bifascicular block   . Breast cancer ([place])    a. DCIS s/p R lumpectomy  . Hyperlipidemia   . Hypertension   . Palpitations    a. holter (7/11):  isolated PVCs and PACs  . Rheumatism   . Valvular heart disease    a. Echo (7/11):  EF 55-60%, Gr 1 DD, mild-mod AI, mild-mod MR, mod PI, PASP 30;  b. Echo (09/2010):  Mild LVH, EF 60%, Gr 1 DD, mild to mod AI, mild MR, mild TR, mild to mod PI, PASP 36.;   c.  Echo (05/2013):  EF 60-65%, no RWMA, mild AI, mild to mod PI     Assessment: 86 yof presenting with CT positive bilateral PE with evidence of RHS, no anticoagulation PTA.  Hgb 8.9, HCT 29.8, and platelets 139.    Goal of Therapy:  Heparin level 0.3-0.7 units/ml Monitor platelets by anticoagulation protocol: Yes   Plan:  Give 2500 units bolus x 1 Start heparin infusion at 800 units/hr Check anti-Xa level in 8 hours and daily while on heparin Continue to monitor H&H and platelets  Thank you,   Eddie Candle, PharmD PGY-1 Pharmacy Resident  Phone:  [phone]

## 2018-09-27 NOTE — ED Provider Notes (Signed)
[facility] Emergency Department Provider Note MRN:  [id number]  Arrival date & time: 09/27/18     Chief Complaint   Atrial Fibrillation   History of Present Illness   Brianna Hunter is a 85 y.o. year-old female with a history of valvular heart disease, hypertension presenting to the ED with chief complaint of atrial fibrillation.  Patient was found unresponsive on the toilet this morning.  EMS was called, and they found the patient was asleep.  Not sure if she syncopized or just fell asleep on the toilet.  She was woken up and was able to stand up and ambulate.  When she stood her heart rate elevated to 120, and then with walking her heart rate elevated to 190 with the monitor showing atrial fibrillation.  During this heart rate of 190 she experienced some shortness of breath and palpitations/chest tightness.  The heart rate then improved to the low 100s and her symptoms resolved.  She is currently without complaints.  She denies recent fever or cough, no dysuria, no pain.  Review of Systems  A complete 10 system review of systems was obtained and all systems are negative except as noted in the HPI and PMH.   Patient's Health History    Past Medical History:  Diagnosis Date   Anxiety    Arthritis    celebrex use off & on for arthritis- hands, back    Back pain    Bifascicular block    Breast cancer ([place])    a. DCIS s/p R lumpectomy   Hyperlipidemia    Hypertension    Palpitations    a. holter (7/11):  isolated PVCs and PACs   Rheumatism    Valvular heart disease    a. Echo (7/11):  EF 55-60%, Gr 1 DD, mild-mod AI, mild-mod MR, mod PI, PASP 30;  b. Echo (09/2010):  Mild LVH, EF 60%, Gr 1 DD, mild to mod AI, mild MR, mild TR, mild to mod PI, PASP 36.;   c.  Echo (05/2013):  EF 60-65%, no RWMA, mild AI, mild to mod PI     Past Surgical History:  Procedure Laterality Date   ABDOMINAL HYSTERECTOMY     partial    APPENDECTOMY     EAR CYST EXCISION   11/07/2011   Procedure: CYST REMOVAL;  Surgeon: Madilyn Hook, DO;  Location: [facility];  Service: General;  Laterality: Right;  Removal Cyst Right Upper Chest   EYE SURGERY     left eye 2013   JOINT REPLACEMENT     2008- L knee   KNEE SURGERY     Left   SHOULDER SURGERY     Left    Family History  Problem Relation Age of Onset   Diabetes Mother    Other Mother        Bleeding disorder   Diabetes Father    Other Father        Bleeding disorder    Social History   Socioeconomic History   Marital status: Widowed    Spouse name: Not on file   Number of children: Not on file   Years of education: Not on file   Highest education level: Not on file  Occupational History   Occupation: Retired  Scientist, product/process development strain: Not on file   Food insecurity    Worry: Not on file    Inability: Not on file   Transportation needs    Medical: Not on file  Non-medical: Not on file  Tobacco Use   Smoking status: Never Smoker   Smokeless tobacco: Never Used  Substance and Sexual Activity   Alcohol use: No   Drug use: No   Sexual activity: Not Currently  Lifestyle   Physical activity    Days per week: Not on file    Minutes per session: Not on file   Stress: Not on file  Relationships   Social connections    Talks on phone: Not on file    Gets together: Not on file    Attends religious service: Not on file    Active member of club or organization: Not on file    Attends meetings of clubs or organizations: Not on file    Relationship status: Not on file   Intimate partner violence    Fear of current or ex partner: Not on file    Emotionally abused: Not on file    Physically abused: Not on file    Forced sexual activity: Not on file  Other Topics Concern   Not on file  Social History Narrative   Widowed   Lives in assisted living and is involved with taking care of her granddaughter     Physical Exam  Vital Signs and Nursing Notes  reviewed Vitals:   09/27/18 1345 09/27/18 1415  BP: 136/81 130/79  Pulse: 83 80  Resp: (!) 23 19  Temp:    SpO2: 97% 97%    CONSTITUTIONAL: Chronically ill-appearing, NAD NEURO:  Alert and oriented x 3, no focal deficits EYES:  eyes equal and reactive ENT/NECK:  no LAD, no JVD CARDIO: Tachycardic rate, well-perfused, normal S1 and S2 PULM:  CTAB no wheezing or rhonchi GI/GU:  normal bowel sounds, non-distended, non-tender MSK/SPINE:  No gross deformities, no edema SKIN:  no rash, atraumatic PSYCH:  Appropriate speech and behavior  Diagnostic and Interventional Summary    EKG Interpretation  Date/Time:  Sunday September 27 2018 08:00:42 EDT Ventricular Rate:  109 PR Interval:    QRS Duration: 140 QT Interval:  380 QTC Calculation: 512 R Axis:   -88 Text Interpretation:  Sinus tachycardia RBBB and LAFB Confirmed by Gerlene Fee [phone]) on 09/27/2018 8:03:57 AM      Labs Reviewed  CBC - Abnormal; Notable for the following components:      Result Value   Hemoglobin 8.9 (*)    HCT 29.8 (*)    MCV 75.4 (*)    MCH 22.5 (*)    MCHC 29.9 (*)    Platelets 139 (*)    All other components within normal limits  COMPREHENSIVE METABOLIC PANEL - Abnormal; Notable for the following components:   Glucose, Bld 136 (*)    Calcium 8.8 (*)    Total Protein 6.3 (*)    Albumin 2.5 (*)    All other components within normal limits  D-DIMER, QUANTITATIVE (NOT AT [facility]) - Abnormal; Notable for the following components:   D-Dimer, Quant >20.00 (*)    All other components within normal limits  URINALYSIS, ROUTINE W REFLEX MICROSCOPIC - Abnormal; Notable for the following components:   Hgb urine dipstick MODERATE (*)    Protein, ur 30 (*)    All other components within normal limits  TROPONIN I (HIGH SENSITIVITY) - Abnormal; Notable for the following components:   Troponin I (High Sensitivity) 32 (*)    All other components within normal limits  TROPONIN I (HIGH SENSITIVITY) - Abnormal;  Notable for the following components:   Troponin I (  High Sensitivity) 74 (*)    All other components within normal limits  SARS CORONAVIRUS 2 (HOSPITAL ORDER, [place] [facility])  HEPARIN LEVEL (UNFRACTIONATED)    CT ANGIO CHEST PE W OR WO CONTRAST  Final Result      Medications  heparin ADULT infusion 100 units/mL (25000 units/269mL sodium chloride 0.45%) (800 Units/hr Intravenous New Bag/Given 09/27/18 1353)  sodium chloride 0.9 % bolus 1,000 mL (0 mLs Intravenous Stopped 09/27/18 0926)  iohexol (OMNIPAQUE) 350 MG/ML injection 100 mL (90 mLs Intravenous Contrast Given 09/27/18 1248)  heparin bolus via infusion 2,500 Units (2,500 Units Intravenous Bolus from Bag 09/27/18 1353)     Procedures Critical Care Critical Care Documentation Critical care time provided by me (excluding procedures): 44 minutes  Condition necessitating critical care: Acute bilateral pulmonary emboli with right heart strain  Components of critical care management: reviewing of prior records, laboratory and imaging interpretation, frequent re-examination and reassessment of vital signs, administration of IV heparin, discussion with consulting services.   ED Course and Medical Decision Making  I have reviewed the triage vital signs and the nursing notes.  Pertinent labs & imaging results that were available during my care of the patient were reviewed by me and considered in my medical decision making (see below for details).  Question of syncope versus falling asleep on the commode in this 85 year old female.  She is currently in no acute distress without complaints, vital signs are overall reassuring, heart rate 109 in sinus rhythm.  Per EMS report it sounds like she was in A. fib with RVR briefly and was symptomatic but it quickly resolved.  I am favoring symptoms related to orthostatic hypotension causing tachycardia when she stood in this triggering a brief run of atrial fibrillation with RVR.   Will provide fluid bolus and obtain screening labs and urinalysis.  She exhibits no evidence of DVT on exam, currently without chest pain or shortness of breath, little to no concern for ACS or PE.  EKG is without ischemic features and is largely unchanged from prior.  Will attempt to talk with family for further information.  Clinical Course as of Sep 26 1441  Sun Sep 27, 2018  0847 Spoke with patient's daughter, who explains that she found patient on the commode and patient was unresponsive, did not look like she was breathing, would not wake up despite significant attempts to wake her, initially question whether or not she had a pulse.  She explains that she checked for a pulse on the chest and did not feel it, but then a few moments later she checked for pulse in the wrist and it was clearly present and then shortly after patient woke up.  I find it unlikely that patient was truly without pulses, more likely to be either a syncopal episode or just being asleep.  Either way we will expand work-up to include troponin and d-dimer.   [MB]  1149 D-dimer, quantitative (not at [facility])(!) [MB]    Clinical Course User Index [MB] Maudie Flakes, MD     CTA reveals bilateral pulmonary embolism, large clot burden, heart strain.  Provided with heparin, admitted to ICU service for further care.  Barth Kirks. Sedonia Small, MD [place] [email or web address]  Final Clinical Impressions(s) / ED Diagnoses     ICD-10-CM   1. Other acute pulmonary embolism with acute cor pulmonale ([place])  I26.09     ED Discharge Orders    None  Maudie Flakes, MD 09/27/18 [id number]

## 2018-09-27 NOTE — H&P (Signed)
NAMEAsenath Hunter, MRN:  [id number], DOB:  09-11-1928, LOS: 0 ADMISSION DATE:  09/27/2018, CONSULTATION DATE:  09/27/2018 REFERRING MD:  Dr Sedonia Small, CHIEF COMPLAINT: Atrial fibrillation  Brief History   Elderly lady admitted with atrial fibrillation, found to have pulmonary embolism in the work-up of the A. fib She was found unresponsive on the toilet this morning Was noted to have heart rate in the 190s-during which she did have symptoms This resolved  History of present illness   She stated she was feeling well when she went to bed last evening Has had history of palpitations in the past  Past Medical History   Past Medical History:  Diagnosis Date  . Anxiety   . Arthritis    celebrex use off & on for arthritis- hands, back   . Back pain   . Bifascicular block   . Breast cancer ([place])    a. DCIS s/p R lumpectomy  . Hyperlipidemia   . Hypertension   . Palpitations    a. holter (7/11):  isolated PVCs and PACs  . Rheumatism   . Valvular heart disease    a. Echo (7/11):  EF 55-60%, Gr 1 DD, mild-mod AI, mild-mod MR, mod PI, PASP 30;  b. Echo (09/2010):  Mild LVH, EF 60%, Gr 1 DD, mild to mod AI, mild MR, mild TR, mild to mod PI, PASP 36.;   c.  Echo (05/2013):  EF 60-65%, no RWMA, mild AI, mild to mod PI     Significant Hospital Events     Procedures:  None Significant Diagnostic Tests:  CT chest PE with right heart strain with a ratio 1.5 Right upper lobe cavitary lesion  Echocardiogram 0/0/8676 did show diastolic dysfunction  Micro Data:  None  Coronavirus negative  Antimicrobials:  None  Interim history/subjective:  Feeling better generally  Objective   Blood pressure 130/79, pulse 80, temperature 97.8 F (36.6 C), temperature source Oral, resp. rate 19, height 4\' 9"  (1.448 m), weight 50 kg, SpO2 97 %.       No intake or output data in the 24 hours ending 09/27/18 1451 Filed Weights   09/27/18 1330  Weight: 50 kg    Examination: General: Elderly  lady, does not appear to be in distress HENT: Moist oral mucosa Lungs: Clear breath sounds bilaterally Cardiovascular: S1-S2 appreciated, irregularly irregular pulse Abdomen: Bowel sounds appreciated Extremities: No clubbing, no edema Neuro: Alert and oriented x3 GU: Voiding  Resolved Hospital Problem list   None  Assessment & Plan:  Acute pulmonary embolism -Submassive -Stable dynamics -Right heart strain -With stable hemodynamics and her age, risk of complications from thrombolysis is very high -We will continue heparin -Plan will be to transition to oral anticoagulant when more stable  Atrial fibrillation -Paroxysmal atrial fibrillation -Rate is controlled at the present time -Sinus rhythm when she was seen -Long-term anticoagulation if can be safely anticoagulated  Episode of unresponsiveness -Did not require any intervention, from documentation mental status back to baseline  Order echocardiogram Venous ultrasound of lower extremity  Best practice:  Diet: Heart healthy Pain/Anxiety/Delirium protocol (if indicated):  VAP protocol (if indicated): Not applicable DVT prophylaxis: On full anticoagulation GI prophylaxis: protonix  Glucose control:  Mobility: As tolerated Code Status: Full code Family Communication: Spoke with patient's daughter Updated daughter about plan of care Disposition: ICU  Labs   CBC: Recent Labs  Lab 09/27/18 0904  WBC 6.2  HGB 8.9*  HCT 29.8*  MCV 75.4*  PLT 139*  Basic Metabolic Panel: Recent Labs  Lab 09/27/18 0904  NA 143  K 3.9  CL 111  CO2 24  GLUCOSE 136*  BUN 15  CREATININE 0.83  CALCIUM 8.8*   GFR: Estimated Creatinine Clearance: 32 mL/min (by C-G formula based on SCr of 0.83 mg/dL). Recent Labs  Lab 09/27/18 0904  WBC 6.2    Liver Function Tests: Recent Labs  Lab 09/27/18 0904  AST 22  ALT 19  ALKPHOS 48  BILITOT 0.6  PROT 6.3*  ALBUMIN 2.5*   No results for input(s): LIPASE, AMYLASE in the  last 168 hours. No results for input(s): AMMONIA in the last 168 hours.  ABG    Component Value Date/Time   PHART 7.376 07/09/2013 0924   PCO2ART 32.6 (L) 07/09/2013 0924   PO2ART 78.0 (L) 07/09/2013 0924   HCO3 22.1 07/13/2018 1821   TCO2 23 07/13/2018 1821   ACIDBASEDEF 3.0 (H) 07/13/2018 1821   O2SAT 70.0 07/13/2018 1821     Coagulation Profile: No results for input(s): INR, PROTIME in the last 168 hours.  Cardiac Enzymes: No results for input(s): CKTOTAL, CKMB, CKMBINDEX, TROPONINI in the last 168 hours.  HbA1C: Hgb A1c MFr Bld  Date/Time Value Ref Range Status  07/13/2018 03:52 PM 5.9 (H) 4.8 - 5.6 % Final    Comment:    (NOTE) Pre diabetes:          5.7%-6.4% Diabetes:              >6.4% Glycemic control for   <7.0% adults with diabetes     CBG: No results for input(s): GLUCAP in the last 168 hours.  Review of Systems:   Review of Systems  Constitutional: Negative.   HENT: Negative.   Eyes: Negative.   Respiratory: Negative.  Negative for cough, hemoptysis, sputum production and shortness of breath.   Cardiovascular: Negative.  Negative for chest pain and leg swelling.  Gastrointestinal: Negative.   Genitourinary: Negative.   Skin: Negative.    Past Medical History  She,  has a past medical history of Anxiety, Arthritis, Back pain, Bifascicular block, Breast cancer ([place]), Hyperlipidemia, Hypertension, Palpitations, Rheumatism, and Valvular heart disease.   Surgical History    Past Surgical History:  Procedure Laterality Date  . ABDOMINAL HYSTERECTOMY     partial   . APPENDECTOMY    . EAR CYST EXCISION  11/07/2011   Procedure: CYST REMOVAL;  Surgeon: Madilyn Hook, DO;  Location: [place];  Service: General;  Laterality: Right;  Removal Cyst Right Upper Chest  . EYE SURGERY     left eye 2013  . JOINT REPLACEMENT     2008- L knee  . KNEE SURGERY     Left  . SHOULDER SURGERY     Left     Social History   reports that she has never smoked. She has  never used smokeless tobacco. She reports that she does not drink alcohol or use drugs.   Family History   Her family history includes Diabetes in her father and mother; Other in her father and mother.   Allergies Allergies  Allergen Reactions  . Milk-Related Compounds Other (See Comments)    Frequent bowel movements   . Augmentin [Amoxicillin-Pot Clavulanate] Diarrhea      The patient is critically ill with multiple organ system failure and requires high complexity decision making for assessment and support, frequent evaluation and titration of therapies, advanced monitoring, review of radiographic studies and interpretation of complex data.    Critical  Care Time devoted to patient care services, exclusive of separately billable procedures, described in this note is 35 minutes.   Sherrilyn Rist, MD [place], PCCM Cell: [phone]

## 2018-09-27 NOTE — ED Triage Notes (Signed)
Pt here from home via EMS. Pt was found in bathroom unresponsive on commode this morning. Family unsure whether she fell asleep on the commode or had syncopal episode. Pt did not fall, there was no injury. Pt ambulatory afterwards, HR 120 while walking then once on cardiac monitor pt went into afib 190. Pt sustained this rhythm for five minutes and then converted to NSR without intervention. Pt had sob and chest pressure while in afib but that has resolved once converted. 324 ASA given PTA.

## 2018-09-27 NOTE — Progress Notes (Signed)
[place] for heparin Indication: pulmonary embolus  Allergies  Allergen Reactions  . Milk-Related Compounds Other (See Comments)    Frequent bowel movements   . Augmentin [Amoxicillin-Pot Clavulanate] Diarrhea    Patient Measurements: Height: 4\' 9"  (144.8 cm) Weight: 110 lb 3.7 oz (50 kg) IBW/kg (Calculated) : 38.6 Heparin Dosing Weight: 48.8  Vital Signs: Temp: 97.5 F (36.4 C) (07/19 2127) Temp Source: Oral (07/19 2127) BP: 117/90 (07/19 2127) Pulse Rate: 73 (07/19 2127)  Labs: Recent Labs    09/27/18 0904 09/27/18 1040 09/27/18 2156  HGB 8.9*  --   --   HCT 29.8*  --   --   PLT 139*  --   --   HEPARINUNFRC  --   --  0.35  CREATININE 0.83  --   --   TROPONINIHS 32* 74*  --     Estimated Creatinine Clearance: 32 mL/min (by C-G formula based on SCr of 0.83 mg/dL).   Medical History: Past Medical History:  Diagnosis Date  . Anxiety   . Arthritis    celebrex use off & on for arthritis- hands, back   . Back pain   . Bifascicular block   . Breast cancer ([place])    a. DCIS s/p R lumpectomy  . Hyperlipidemia   . Hypertension   . Palpitations    a. holter (7/11):  isolated PVCs and PACs  . Rheumatism   . Valvular heart disease    a. Echo (7/11):  EF 55-60%, Gr 1 DD, mild-mod AI, mild-mod MR, mod PI, PASP 30;  b. Echo (09/2010):  Mild LVH, EF 60%, Gr 1 DD, mild to mod AI, mild MR, mild TR, mild to mod PI, PASP 36.;   c.  Echo (05/2013):  EF 60-65%, no RWMA, mild AI, mild to mod PI     Assessment: 28 yof presenting with CT positive bilateral PE with evidence of RHS, no anticoagulation PTA.  Hgb 8.9, HCT 29.8, and platelets 139.   Initial heparin level at goal this evening.  No overt bleeding or complications noted.   Goal of Therapy:  Heparin level 0.3-0.7 units/ml Monitor platelets by anticoagulation protocol: Yes   Plan:  Continue IV heparin at current rate. Confirm heparin level in 8 hrs. Daily heparin level and  CBC.  Marguerite Olea, [facility] Clinical Pharmacist Phone [phone]  09/27/2018 10:34 PM

## 2018-09-27 NOTE — ED Notes (Signed)
Nurse tech accidentally clicked off urinalysis. Cancelled and reordered.

## 2018-09-28 ENCOUNTER — Inpatient Hospital Stay (HOSPITAL_COMMUNITY): Payer: Medicare HMO

## 2018-09-28 DIAGNOSIS — I4891 Unspecified atrial fibrillation: Secondary | ICD-10-CM

## 2018-09-28 DIAGNOSIS — I340 Nonrheumatic mitral (valve) insufficiency: Secondary | ICD-10-CM

## 2018-09-28 DIAGNOSIS — R4189 Other symptoms and signs involving cognitive functions and awareness: Secondary | ICD-10-CM

## 2018-09-28 DIAGNOSIS — R0902 Hypoxemia: Secondary | ICD-10-CM

## 2018-09-28 DIAGNOSIS — I2699 Other pulmonary embolism without acute cor pulmonale: Secondary | ICD-10-CM

## 2018-09-28 DIAGNOSIS — I361 Nonrheumatic tricuspid (valve) insufficiency: Secondary | ICD-10-CM

## 2018-09-28 LAB — ECHOCARDIOGRAM COMPLETE
Height: 57 in
Weight: 1710.77 oz

## 2018-09-28 LAB — CBC
HCT: 28.6 % — ABNORMAL LOW (ref 36.0–46.0)
Hemoglobin: 8.9 g/dL — ABNORMAL LOW (ref 12.0–15.0)
MCH: 22.8 pg — ABNORMAL LOW (ref 26.0–34.0)
MCHC: 31.1 g/dL (ref 30.0–36.0)
MCV: 73.1 fL — ABNORMAL LOW (ref 80.0–100.0)
Platelets: UNDETERMINED 10*3/uL (ref 150–400)
RBC: 3.91 MIL/uL (ref 3.87–5.11)
RDW: 14.9 % (ref 11.5–15.5)
WBC: 5.4 10*3/uL (ref 4.0–10.5)
nRBC: 0 % (ref 0.0–0.2)

## 2018-09-28 LAB — BASIC METABOLIC PANEL
Anion gap: 9 (ref 5–15)
BUN: 15 mg/dL (ref 8–23)
CO2: 23 mmol/L (ref 22–32)
Calcium: 8.9 mg/dL (ref 8.9–10.3)
Chloride: 108 mmol/L (ref 98–111)
Creatinine, Ser: 0.73 mg/dL (ref 0.44–1.00)
GFR calc Af Amer: >60 mL/min (ref >60)
GFR calc non Af Amer: >60 mL/min (ref >60)
Glucose, Bld: 88 mg/dL (ref 70–99)
Potassium: 3.5 mmol/L (ref 3.5–5.1)
Sodium: 140 mmol/L (ref 135–145)

## 2018-09-28 LAB — HEPARIN LEVEL (UNFRACTIONATED): Heparin Unfractionated: 0.49 IU/mL (ref 0.30–0.70)

## 2018-09-28 NOTE — Progress Notes (Signed)
NAMEAdalyna Hunter, MRN:  [id number], DOB:  1928-04-14, LOS: 1 ADMISSION DATE:  09/27/2018, CONSULTATION DATE:  09/27/2018 REFERRING MD:  Dr Sedonia Small, CHIEF COMPLAINT: Atrial fibrillation  Brief History   Elderly lady admitted with atrial fibrillation, found to have pulmonary embolism in the work-up of the A. fib She was found unresponsive on the toilet this morning Was noted to have heart rate in the 190s-during which she did have symptoms This resolved  History of present illness   She stated she was feeling well when she went to bed last evening Has had history of palpitations in the past  Past Medical History   Past Medical History:  Diagnosis Date  . Anxiety   . Arthritis    celebrex use off & on for arthritis- hands, back   . Back pain   . Bifascicular block   . Breast cancer ([place])    a. DCIS s/p R lumpectomy  . Hyperlipidemia   . Hypertension   . Palpitations    a. holter (7/11):  isolated PVCs and PACs  . Rheumatism   . Valvular heart disease    a. Echo (7/11):  EF 55-60%, Gr 1 DD, mild-mod AI, mild-mod MR, mod PI, PASP 30;  b. Echo (09/2010):  Mild LVH, EF 60%, Gr 1 DD, mild to mod AI, mild MR, mild TR, mild to mod PI, PASP 36.;   c.  Echo (05/2013):  EF 60-65%, no RWMA, mild AI, mild to mod PI     Significant Hospital Events     Procedures:  None Significant Diagnostic Tests:  CT chest PE with right heart strain with a ratio 1.5 Right upper lobe cavitary lesion  Echocardiogram 04/20/7987 did show diastolic dysfunction  Micro Data:  None  Coronavirus negative  Antimicrobials:  None  Interim history/subjective:  No events overnight, no new complaints  Objective   Blood pressure 113/69, pulse 68, temperature 97.7 F (36.5 C), temperature source Oral, resp. rate 19, height 4\' 9"  (1.448 m), weight 48.5 kg, SpO2 100 %.        Intake/Output Summary (Last 24 hours) at 09/28/2018 0946 Last data filed at 09/28/2018 2119 Gross per 24 hour  Intake 152.98 ml   Output 300 ml  Net -147.02 ml   Filed Weights   09/27/18 1330 09/28/18 0339  Weight: 50 kg 48.5 kg    Examination: General: Chronically ill appearing female, NAD HENT: [place]/AT, PERRL, EOM-I and MMM Lungs: CTA bilaterally Cardiovascular: RRR, Nl S1/S2 and -M/R/G Abdomen: Soft, NT, ND and +BS Extremities: -edema and -tenderness Neuro: Alert and oriented x3 GU: Voiding  I reviewed chest CT myself, PE noted  Resolved Hospital Problem list   None  Assessment & Plan:  Acute pulmonary embolism - With stable hemodynamics and her age, risk of complications from thrombolysis is very high - Continue heparin - Pharmacy to dose PO anti-coagulants  Atrial fibrillation: Rate is controlled at the present time - Sinus rhythm when she was seen - Long-term anticoagulation if can be safely anticoagulated  Episode of unresponsiveness - Did not require any intervention, from documentation mental status back to baseline  Order echocardiogram Venous ultrasound of lower extremity  Discussed with PCCM-NP  Best practice:  Diet: Heart healthy Pain/Anxiety/Delirium protocol (if indicated):  VAP protocol (if indicated): Not applicable DVT prophylaxis: On full anticoagulation GI prophylaxis: protonix  Glucose control:  Mobility: As tolerated Code Status: Full code Family Communication: Spoke with patient's daughter Updated daughter about plan of care Disposition: ICU  Labs  CBC: Recent Labs  Lab 09/27/18 0904 09/28/18 0558  WBC 6.2 5.4  HGB 8.9* 8.9*  HCT 29.8* 28.6*  MCV 75.4* 73.1*  PLT 139* PLATELET CLUMPS NOTED ON SMEAR, UNABLE TO ESTIMATE    Basic Metabolic Panel: Recent Labs  Lab 09/27/18 0904 09/28/18 0558  NA 143 140  K 3.9 3.5  CL 111 108  CO2 24 23  GLUCOSE 136* 88  BUN 15 15  CREATININE 0.83 0.73  CALCIUM 8.8* 8.9   GFR: Estimated Creatinine Clearance: 32.7 mL/min (by C-G formula based on SCr of 0.73 mg/dL). Recent Labs  Lab 09/27/18 0904 09/28/18  0558  WBC 6.2 5.4    Liver Function Tests: Recent Labs  Lab 09/27/18 0904  AST 22  ALT 19  ALKPHOS 48  BILITOT 0.6  PROT 6.3*  ALBUMIN 2.5*   No results for input(s): LIPASE, AMYLASE in the last 168 hours. No results for input(s): AMMONIA in the last 168 hours.  ABG    Component Value Date/Time   PHART 7.376 07/09/2013 0924   PCO2ART 32.6 (L) 07/09/2013 0924   PO2ART 78.0 (L) 07/09/2013 0924   HCO3 22.1 07/13/2018 1821   TCO2 23 07/13/2018 1821   ACIDBASEDEF 3.0 (H) 07/13/2018 1821   O2SAT 70.0 07/13/2018 1821     Coagulation Profile: No results for input(s): INR, PROTIME in the last 168 hours.  Cardiac Enzymes: No results for input(s): CKTOTAL, CKMB, CKMBINDEX, TROPONINI in the last 168 hours.  HbA1C: Hgb A1c MFr Bld  Date/Time Value Ref Range Status  07/13/2018 03:52 PM 5.9 (H) 4.8 - 5.6 % Final    Comment:    (NOTE) Pre diabetes:          5.7%-6.4% Diabetes:              >6.4% Glycemic control for   <7.0% adults with diabetes    CBG: No results for input(s): GLUCAP in the last 168 hours.   Past Surgical History:  Procedure Laterality Date  . ABDOMINAL HYSTERECTOMY     partial   . APPENDECTOMY    . EAR CYST EXCISION  11/07/2011   Procedure: CYST REMOVAL;  Surgeon: Madilyn Hook, DO;  Location: [place];  Service: General;  Laterality: Right;  Removal Cyst Right Upper Chest  . EYE SURGERY     left eye 2013  . JOINT REPLACEMENT     2008- L knee  . KNEE SURGERY     Left  . SHOULDER SURGERY     Left     Social History   reports that she has never smoked. She has never used smokeless tobacco. She reports that she does not drink alcohol or use drugs.   Family History   Her family history includes Diabetes in her father and mother; Other in her father and mother.   Allergies Allergies  Allergen Reactions  . Milk-Related Compounds Other (See Comments)    Frequent bowel movements   . Augmentin [Amoxicillin-Pot Clavulanate] Diarrhea    Rush Farmer, M.D. [facility] Pulmonary/Critical Care Medicine. Pager: [phone]. After hours pager: [phone].

## 2018-09-28 NOTE — Progress Notes (Signed)
[place] for heparin Indication: pulmonary embolus  Allergies  Allergen Reactions  . Milk-Related Compounds Other (See Comments)    Frequent bowel movements   . Augmentin [Amoxicillin-Pot Clavulanate] Diarrhea    Patient Measurements: Height: 4\' 9"  (144.8 cm) Weight: 106 lb 14.8 oz (48.5 kg) IBW/kg (Calculated) : 38.6 Heparin Dosing Weight: 48.8  Vital Signs: Temp: 97.7 F (36.5 C) (07/20 0753) Temp Source: Oral (07/20 0753) BP: 113/69 (07/20 0753) Pulse Rate: 68 (07/20 0753)  Labs: Recent Labs    09/27/18 0904 09/27/18 1040 09/27/18 2156 09/28/18 0558  HGB 8.9*  --   --  8.9*  HCT 29.8*  --   --  28.6*  PLT 139*  --   --  PLATELET CLUMPS NOTED ON SMEAR, UNABLE TO ESTIMATE  HEPARINUNFRC  --   --  0.35 0.49  CREATININE 0.83  --   --  0.73  TROPONINIHS 32* 74*  --   --     Estimated Creatinine Clearance: 32.7 mL/min (by C-G formula based on SCr of 0.73 mg/dL).   Medical History: Past Medical History:  Diagnosis Date  . Anxiety   . Arthritis    celebrex use off & on for arthritis- hands, back   . Back pain   . Bifascicular block   . Breast cancer ([place])    a. DCIS s/p R lumpectomy  . Hyperlipidemia   . Hypertension   . Palpitations    a. holter (7/11):  isolated PVCs and PACs  . Rheumatism   . Valvular heart disease    a. Echo (7/11):  EF 55-60%, Gr 1 DD, mild-mod AI, mild-mod MR, mod PI, PASP 30;  b. Echo (09/2010):  Mild LVH, EF 60%, Gr 1 DD, mild to mod AI, mild MR, mild TR, mild to mod PI, PASP 36.;   c.  Echo (05/2013):  EF 60-65%, no RWMA, mild AI, mild to mod PI     Assessment: 46 yof presenting with CT positive bilateral PE with evidence of RHS, no anticoagulation PTA.  Hgb 8.9, HCT 29.8, and platelets 139.   Heparin level at goal this morning. No overt bleeding or complications noted. Hgb stable, plt clumped.   Goal of Therapy:  Heparin level 0.3-0.7 units/ml Monitor platelets by anticoagulation protocol:  Yes   Plan:  Continue IV heparin at current rate. Daily heparin level and CBC.  Erin Hearing PharmD., BCPS Clinical Pharmacist 09/28/2018 8:42 AM

## 2018-09-28 NOTE — Progress Notes (Signed)
  Echocardiogram 2D Echocardiogram has been performed.  Jannett Celestine 09/28/2018, 10:13 AM

## 2018-09-28 NOTE — Progress Notes (Signed)
LE venous duplex       has been completed. Preliminary results can be found under CV proc through chart review. Vrinda Heckstall, BS, RDMS, RVT   

## 2018-09-29 DIAGNOSIS — F0390 Unspecified dementia without behavioral disturbance: Secondary | ICD-10-CM

## 2018-09-29 DIAGNOSIS — G9341 Metabolic encephalopathy: Secondary | ICD-10-CM

## 2018-09-29 DIAGNOSIS — I480 Paroxysmal atrial fibrillation: Secondary | ICD-10-CM

## 2018-09-29 LAB — BASIC METABOLIC PANEL
Anion gap: 9 (ref 5–15)
BUN: 12 mg/dL (ref 8–23)
CO2: 25 mmol/L (ref 22–32)
Calcium: 9.1 mg/dL (ref 8.9–10.3)
Chloride: 105 mmol/L (ref 98–111)
Creatinine, Ser: 0.79 mg/dL (ref 0.44–1.00)
GFR calc Af Amer: >60 mL/min (ref >60)
GFR calc non Af Amer: >60 mL/min (ref >60)
Glucose, Bld: 125 mg/dL — ABNORMAL HIGH (ref 70–99)
Potassium: 3.5 mmol/L (ref 3.5–5.1)
Sodium: 139 mmol/L (ref 135–145)

## 2018-09-29 LAB — CBC
HCT: 29.9 % — ABNORMAL LOW (ref 36.0–46.0)
Hemoglobin: 9.5 g/dL — ABNORMAL LOW (ref 12.0–15.0)
MCH: 22.8 pg — ABNORMAL LOW (ref 26.0–34.0)
MCHC: 31.8 g/dL (ref 30.0–36.0)
MCV: 71.9 fL — ABNORMAL LOW (ref 80.0–100.0)
Platelets: 150 10*3/uL (ref 150–400)
RBC: 4.16 MIL/uL (ref 3.87–5.11)
RDW: 14.4 % (ref 11.5–15.5)
WBC: 7.4 10*3/uL (ref 4.0–10.5)
nRBC: 0 % (ref 0.0–0.2)

## 2018-09-29 LAB — PROTIME-INR
INR: 1.4 — ABNORMAL HIGH (ref 0.8–1.2)
Prothrombin Time: 16.5 seconds — ABNORMAL HIGH (ref 11.4–15.2)

## 2018-09-29 LAB — HEPARIN LEVEL (UNFRACTIONATED): Heparin Unfractionated: 0.57 IU/mL (ref 0.30–0.70)

## 2018-09-29 LAB — PHOSPHORUS: Phosphorus: 3.3 mg/dL (ref 2.5–4.6)

## 2018-09-29 LAB — MAGNESIUM: Magnesium: 1.6 mg/dL — ABNORMAL LOW (ref 1.7–2.4)

## 2018-09-29 MED ORDER — APIXABAN 5 MG PO TABS
10.0000 mg | ORAL_TABLET | Freq: Two times a day (BID) | ORAL | Status: DC
  Administered 2018-09-29 – 2018-09-30 (×3): 10 mg via ORAL
  Filled 2018-09-29 (×2): qty 2
  Filled 2018-09-29: qty 4
  Filled 2018-09-29: qty 2

## 2018-09-29 MED ORDER — APIXABAN 5 MG PO TABS: 5.0000 mg | ORAL_TABLET | Freq: Two times a day (BID) | ORAL | Status: DC

## 2018-09-29 NOTE — Discharge Instructions (Addendum)
Information on my medicine - ELIQUIS (apixaban)  Why was Eliquis prescribed for you? Eliquis was prescribed to treat blood clots that may have been found in the veins of your legs (deep vein thrombosis) or in your lungs (pulmonary embolism) and to reduce the risk of them occurring again.  What do You need to know about Eliquis ? The starting dose is 10 mg (two 5 mg tablets) taken TWICE daily for the FIRST SEVEN (7) DAYS, then on 10/06/18 the dose is reduced to ONE 5 mg tablet taken TWICE daily.  Eliquis may be taken with or without food.   Try to take the dose about the same time in the morning and in the evening. If you have difficulty swallowing the tablet whole please discuss with your pharmacist how to take the medication safely.  Take Eliquis exactly as prescribed and DO NOT stop taking Eliquis without talking to the doctor who prescribed the medication.  Stopping may increase your risk of developing a new blood clot.  Refill your prescription before you run out.  After discharge, you should have regular check-up appointments with your healthcare provider that is prescribing your Eliquis.    What do you do if you miss a dose? If a dose of ELIQUIS is not taken at the scheduled time, take it as soon as possible on the same day and twice-daily administration should be resumed. The dose should not be doubled to make up for a missed dose.  Important Safety Information A possible side effect of Eliquis is bleeding. You should call your healthcare provider right away if you experience any of the following: ? Bleeding from an injury or your nose that does not stop. ? Unusual colored urine (red or dark brown) or unusual colored stools (red or black). ? Unusual bruising for unknown reasons. ? A serious fall or if you hit your head (even if there is no bleeding).  Some medicines may interact with Eliquis and might increase your risk of bleeding or clotting while on Eliquis. To help avoid  this, consult your healthcare provider or pharmacist prior to using any new prescription or non-prescription medications, including herbals, vitamins, non-steroidal anti-inflammatory drugs (NSAIDs) and supplements.  This website has more information on Eliquis (apixaban): http://www.eliquis.com/eliquis/home   Additional discharge instructions  Please get your medications reviewed and adjusted by your Primary MD.  Please request your Primary MD to go over all Hospital Tests and Procedure/Radiological results at the follow up, please get all Hospital records sent to your Prim MD by signing hospital release before you go home.  If you had Pneumonia of Lung problems at the Hospital: Please get a 2 view Chest X ray done in 6-8 weeks after hospital discharge or sooner if instructed by your Primary MD.  If you have Congestive Heart Failure: Please call your Cardiologist or Primary MD anytime you have any of the following symptoms:  1) 3 pound weight gain in 24 hours or 5 pounds in 1 week  2) shortness of breath, with or without a dry hacking cough  3) swelling in the hands, feet or stomach  4) if you have to sleep on extra pillows at night in order to breathe  Follow cardiac low salt diet and 1.5 lit/day fluid restriction.  If you have diabetes Accuchecks 4 times/day, Once in AM empty stomach and then before each meal. Log in all results and show them to your primary doctor at your next visit. If any glucose reading is under 80 or  above 300 call your primary MD immediately.  If you have Seizure/Convulsions/Epilepsy: Please do not drive, operate heavy machinery, participate in activities at heights or participate in high speed sports until you have seen by Primary MD or a Neurologist and advised to do so again.  If you had Gastrointestinal Bleeding: Please ask your Primary MD to check a complete blood count within one week of discharge or at your next visit. Your endoscopic/colonoscopic  biopsies that are pending at the time of discharge, will also need to followed by your Primary MD.  Get Medicines reviewed and adjusted. Please take all your medications with you for your next visit with your Primary MD  Please request your Primary MD to go over all hospital tests and procedure/radiological results at the follow up, please ask your Primary MD to get all Hospital records sent to his/her office.  If you experience worsening of your admission symptoms, develop shortness of breath, life threatening emergency, suicidal or homicidal thoughts you must seek medical attention immediately by calling 911 or calling your MD immediately  if symptoms less severe.  You must read complete instructions/literature along with all the possible adverse reactions/side effects for all the Medicines you take and that have been prescribed to you. Take any new Medicines after you have completely understood and accpet all the possible adverse reactions/side effects.   Do not drive or operate heavy machinery when taking Pain medications.   Do not take more than prescribed Pain, Sleep and Anxiety Medications  Special Instructions: If you have smoked or chewed Tobacco  in the last 2 yrs please stop smoking, stop any regular Alcohol  and or any Recreational drug use.  Wear Seat belts while driving.  Please note You were cared for by a hospitalist during your hospital stay. If you have any questions about your discharge medications or the care you received while you were in the hospital after you are discharged, you can call the unit and asked to speak with the hospitalist on call if the hospitalist that took care of you is not available. Once you are discharged, your primary care physician will handle any further medical issues. Please note that NO REFILLS for any discharge medications will be authorized once you are discharged, as it is imperative that you return to your primary care physician (or establish a  relationship with a primary care physician if you do not have one) for your aftercare needs so that they can reassess your need for medications and monitor your lab values.  You can reach the hospitalist office at phone [phone] or fax [phone]   If you do not have a primary care physician, you can call [phone] for a physician referral.

## 2018-09-29 NOTE — Plan of Care (Signed)

## 2018-09-29 NOTE — Progress Notes (Signed)
Anna for heparin Indication: pulmonary embolus  Allergies  Allergen Reactions  . Milk-Related Compounds Other (See Comments)    Frequent bowel movements   . Augmentin [Amoxicillin-Pot Clavulanate] Diarrhea    Patient Measurements: Height: 4\' 9"  (144.8 cm) Weight: 102 lb 11.8 oz (46.6 kg) IBW/kg (Calculated) : 38.6 Heparin Dosing Weight: 48.8  Vital Signs: Temp: 97.6 F (36.4 C) (07/21 0722) Temp Source: Oral (07/21 0722) BP: 124/62 (07/21 0722) Pulse Rate: 72 (07/21 0722)  Labs: Recent Labs    09/27/18 0904 09/27/18 1040 09/27/18 2156 09/28/18 0558 09/29/18 0216  HGB 8.9*  --   --  8.9* 9.5*  HCT 29.8*  --   --  28.6* 29.9*  PLT 139*  --   --  PLATELET CLUMPS NOTED ON SMEAR, UNABLE TO ESTIMATE 150  LABPROT  --   --   --   --  16.5*  INR  --   --   --   --  1.4*  HEPARINUNFRC  --   --  0.35 0.49 0.57  CREATININE 0.83  --   --  0.73 0.79  TROPONINIHS 32* 74*  --   --   --     Estimated Creatinine Clearance: 32.1 mL/min (by C-G formula based on SCr of 0.79 mg/dL).   Medical History: Past Medical History:  Diagnosis Date  . Anxiety   . Arthritis    celebrex use off & on for arthritis- hands, back   . Back pain   . Bifascicular block   . Breast cancer ([place])    a. DCIS s/p R lumpectomy  . Hyperlipidemia   . Hypertension   . Palpitations    a. holter (7/11):  isolated PVCs and PACs  . Rheumatism   . Valvular heart disease    a. Echo (7/11):  EF 55-60%, Gr 1 DD, mild-mod AI, mild-mod MR, mod PI, PASP 30;  b. Echo (09/2010):  Mild LVH, EF 60%, Gr 1 DD, mild to mod AI, mild MR, mild TR, mild to mod PI, PASP 36.;   c.  Echo (05/2013):  EF 60-65%, no RWMA, mild AI, mild to mod PI     Assessment: 61 yof presenting with CT positive bilateral PE with evidence of RHS, no anticoagulation PTA.  -heparin level at goal, hg= 9.5    Goal of Therapy:  Heparin level 0.3-0.7 units/ml Monitor platelets by anticoagulation  protocol: Yes   Plan:  Continue IV heparin at current rate. Daily heparin level and CBC.  Hildred Laser, PharmD Clinical Pharmacist **Pharmacist phone directory can now be found on [email or web address] ([email or web address]).  Listed under [place].

## 2018-09-29 NOTE — Evaluation (Signed)
Physical Therapy Evaluation Patient Details Name: Brianna Hunter MRN: [id number] DOB: September 24, 1928 Today's Date: 09/29/2018   History of Present Illness  85 year old female with history of left breast DCIS status lumpectomy treated with tamoxifen, hypertension, hyperlipidemia, dementia, anxiety, valvular heart disease and bifascicular block, admission in May 2020 for fall and A. fib with RVR which subsequently converted to sinus rhythm and was not started on anticoagulation because of fall/dementia and advanced age and subsequent discharge to SNF.  She presented on 09/27/2018 for a brief period of unresponsiveness and was found to have A. fib with RVR along with PE with right heart strain.  She was admitted to ICU service and started on heparin drip.  Clinical Impression  Pt admitted with/for Afib with RVR and PE.  Mobility likely not at baseline and pt needing minimal assist for general mobility and gait.Marland Kitchen  Pt currently limited functionally due to the problems listed below.  (see problems list.)  Pt will benefit from PT to maximize function and safety to be able to get home safely with available assist.     Follow Up Recommendations Home health PT;Supervision/Assistance - 24 hour    Equipment Recommendations  None recommended by PT    Recommendations for Other Services       Precautions / Restrictions Precautions Precautions: Fall      Mobility  Bed Mobility               General bed mobility comments: NT  Transfers Overall transfer level: Needs assistance   Transfers: Sit to/from Stand Sit to Stand: Min assist         General transfer comment: cues for hand placement/direction.  Assist to help come forward and stability  Ambulation/Gait Ambulation/Gait assistance: Min assist Gait Distance (Feet): 180 Feet Assistive device: Rolling walker (2 wheeled) Gait Pattern/deviations: Step-through pattern Gait velocity: slower   General Gait Details: mildly unsteady.   steady assist needed even with the RW.  Stairs            Wheelchair Mobility    Modified Rankin (Stroke Patients Only)       Balance Overall balance assessment: Needs assistance Sitting-balance support: No upper extremity supported Sitting balance-Leahy Scale: Fair       Standing balance-Leahy Scale: Poor Standing balance comment: reliant on AD and external support.                             Pertinent Vitals/Pain Pain Assessment: Faces Faces Pain Scale: No hurt    Home Living Family/patient expects to be discharged to:: Private residence Living Arrangements: Other (Comment)(dtr and grand child) Available Help at Discharge: Family;Available 24 hours/day;Available PRN/intermittently;Other (Comment)(dtr works from home) Type of Home: [place] Access: Level entry     Home Layout: One [place]: [place] - single point;Walker - 2 wheels      Prior Function Level of Independence: Independent with assistive device(s)         Comments: pt is recorded as an unreliable historian     Hand Dominance   Dominant Hand: Right    Extremity/Trunk Assessment   Upper Extremity Assessment Upper Extremity Assessment: Generalized weakness(NT formally)    Lower Extremity Assessment Lower Extremity Assessment: Generalized weakness       Communication   Communication: HOH  Cognition Arousal/Alertness: Awake/alert Behavior During Therapy: WFL for tasks assessed/performed Overall Cognitive Status: History of cognitive impairments - at baseline  General Comments General comments (skin integrity, edema, etc.): EHR up to 120 bpm with noticeable fatigue    Exercises     Assessment/Plan    PT Assessment Patient needs continued PT services  PT Problem List Decreased strength;Decreased activity tolerance;Decreased balance;Decreased mobility;Decreased knowledge of use of  DME;Cardiopulmonary status limiting activity       PT Treatment Interventions Gait training;Functional mobility training;Therapeutic activities;DME instruction;Balance training;Patient/family education    PT Goals (Current goals can be found in the Care Plan section)  Acute Rehab PT Goals Patient Stated Goal: get home soon PT Goal Formulation: With patient Time For Goal Achievement: 10/06/18 Potential to Achieve Goals: Good    Frequency Min 3X/week   Barriers to discharge        Co-evaluation               AM-PAC PT "6 Clicks" Mobility  Outcome Measure Help needed turning from your back to your side while in a flat bed without using bedrails?: A Little Help needed moving from lying on your back to sitting on the side of a flat bed without using bedrails?: A Little Help needed moving to and from a bed to a chair (including a wheelchair)?: A Little Help needed standing up from a chair using your arms (e.g., wheelchair or bedside chair)?: A Little Help needed to walk in hospital room?: A Little Help needed climbing 3-5 steps with a railing? : A Little 6 Click Score: 18    End of Session   Activity Tolerance: Patient limited by fatigue Patient left: in chair;with call bell/phone within reach Nurse Communication: Mobility status PT Visit Diagnosis: Unsteadiness on feet (R26.81);Muscle weakness (generalized) (M62.81)    Time: 0017-4944 PT Time Calculation (min) (ACUTE ONLY): 36 min   Charges:   PT Evaluation $PT Eval Moderate Complexity: 1 Mod PT Treatments $Gait Training: 8-22 mins        09/29/2018  Donnella Sham, PT Acute Rehabilitation Services [phone]  (pager) [phone]  (office)  Brianna Hunter 09/29/2018, 2:07 PM

## 2018-09-29 NOTE — Progress Notes (Signed)
Patient ID: Brianna Hunter, female   DOB: November 03, 1928, 85 y.o.   MRN: [id number]  PROGRESS NOTE    Brianna Hunter  DZH:[id number] DOB: [birth year] DOA: 09/27/2018 PCP: [name], MD   Brief Narrative:  85 year old female with history of left breast DCIS status lumpectomy treated with tamoxifen, hypertension, hyperlipidemia, dementia, anxiety, valvular heart disease and bifascicular block, admission in May 2020 for fall and A. fib with RVR which subsequently converted to sinus rhythm and was not started on anticoagulation because of fall/dementia and advanced age and subsequent discharge to SNF.  She presented on 09/27/2018 for a brief period of unresponsiveness and was found to have A. fib with RVR along with PE with right heart strain.  She was admitted to ICU service and started on heparin drip.  Assessment & Plan:   Acute pulmonary embolism  -CT chest on admission showed bilateral PE with right ventricular strain consistent with submassive PE.  With stable hemodynamics and her age, risk of complications from thrombolysis was deemed to be very high.  She was admitted to ICU. -Started on heparin drip. -Lower extremity duplex negative for DVT. -Currently on room air -Echo showed EF of 50 to 55% -Spoke to daughter on phone and decided to start patient on Eliquis.   -We will start Eliquis  Paroxysmal A. fib with RVR -Currently in sinus rhythm.  Heart rate controlled.  Eliquis plan as above  Probable acute metabolic encephalopathy History of dementia -Patient had an episode of unresponsiveness initially which has resolved.  Patient is back to probably her baseline and is pleasantly confused.  Hypomagnesemia -Replace.  Repeat a.m. labs  Anemia of chronic disease -Hemoglobin stable  Thrombocytopenia -Questionable cause.  Resolved  Generalized deconditioning -PT eval   DVT prophylaxis: Heparin Code Status: Full Family Communication: Spoke to patient's daughter/May on  phone Disposition Plan: Home tomorrow if remains hemodynamically stable.  Consultants:   Procedures:  Echo  IMPRESSIONS    1. The left ventricle has low normal systolic function, with an ejection fraction of 50-55%. The cavity size was normal. Left ventricular diastolic Doppler parameters are consistent with impaired relaxation.  2. The right ventricle has normal systolic function. The cavity was normal.  3. The aortic root is normal in size and structure.  4. The tricuspid valve is grossly normal.  5. The interatrial septum is aneurysmal.  6. The aortic valve is tricuspid. Mild thickening of the aortic valve. Aortic valve regurgitation is mild by color flow Doppler. No stenosis of the aortic valve.  7. Trivial pericardial effusion is present.  8. The mitral valve is grossly normal.  9. Low normal LV systolic function; mild diastolic dysfunction; midl AI; mild RV dysfunction; mild MR and TR; estimated RVSP mildly elevated.  Antimicrobials:  None  Subjective: Patient seen and examined at bedside.  She is awake and pleasantly confused.  No overnight fever or vomiting reported.  Objective: Vitals:   09/29/18 0331 09/29/18 0501 09/29/18 0722 09/29/18 1109  BP: 140/71  124/62 127/88  Pulse: 66  72 86  Resp: (!) 22  (!) 22 17  Temp: 98.1 F (36.7 C)  97.6 F (36.4 C) (!) 97.4 F (36.3 C)  TempSrc: Axillary  Oral Oral  SpO2: 100%  100% 100%  Weight:  46.6 kg    Height:        Intake/Output Summary (Last 24 hours) at 09/29/2018 1159 Last data filed at 09/29/2018 1100 Gross per 24 hour  Intake 348 ml  Output 450 ml  Net -102  ml   Filed Weights   09/27/18 1330 09/28/18 0339 09/29/18 0501  Weight: 50 kg 48.5 kg 46.6 kg    Examination:  General exam: Appears calm and comfortable.  Looks pleasantly confused. Respiratory system: Bilateral decreased breath sounds at bases with some scattered crackles Cardiovascular system: S1 & S2 heard, Rate controlled Gastrointestinal  system: Abdomen is nondistended, soft and nontender. Normal bowel sounds heard. Extremities: No cyanosis, clubbing, edema  Central nervous system: Awake but pleasantly confused. No focal neurological deficits. Moving extremities Skin: No rashes, lesion or ulcers Psychiatry: Could not be assessed because of mental status.    Data Reviewed: I have personally reviewed following labs and imaging studies  CBC: Recent Labs  Lab 09/27/18 0904 09/28/18 0558 09/29/18 0216  WBC 6.2 5.4 7.4  HGB 8.9* 8.9* 9.5*  HCT 29.8* 28.6* 29.9*  MCV 75.4* 73.1* 71.9*  PLT 139* PLATELET CLUMPS NOTED ON SMEAR, UNABLE TO ESTIMATE 361   Basic Metabolic Panel: Recent Labs  Lab 09/27/18 0904 09/28/18 0558 09/29/18 0216  NA 143 140 139  K 3.9 3.5 3.5  CL 111 108 105  CO2 24 23 25   GLUCOSE 136* 88 125*  BUN 15 15 12   CREATININE 0.83 0.73 0.79  CALCIUM 8.8* 8.9 9.1  MG  --   --  1.6*  PHOS  --   --  3.3   GFR: Estimated Creatinine Clearance: 32.1 mL/min (by C-G formula based on SCr of 0.79 mg/dL). Liver Function Tests: Recent Labs  Lab 09/27/18 0904  AST 22  ALT 19  ALKPHOS 48  BILITOT 0.6  PROT 6.3*  ALBUMIN 2.5*   No results for input(s): LIPASE, AMYLASE in the last 168 hours. No results for input(s): AMMONIA in the last 168 hours. Coagulation Profile: Recent Labs  Lab 09/29/18 0216  INR 1.4*   Cardiac Enzymes: No results for input(s): CKTOTAL, CKMB, CKMBINDEX, TROPONINI in the last 168 hours. BNP (last 3 results) No results for input(s): PROBNP in the last 8760 hours. HbA1C: No results for input(s): HGBA1C in the last 72 hours. CBG: No results for input(s): GLUCAP in the last 168 hours. Lipid Profile: No results for input(s): CHOL, HDL, LDLCALC, TRIG, CHOLHDL, LDLDIRECT in the last 72 hours. Thyroid Function Tests: No results for input(s): TSH, T4TOTAL, FREET4, T3FREE, THYROIDAB in the last 72 hours. Anemia Panel: No results for input(s): VITAMINB12, FOLATE, FERRITIN,  TIBC, IRON, RETICCTPCT in the last 72 hours. Sepsis Labs: No results for input(s): PROCALCITON, LATICACIDVEN in the last 168 hours.  Recent Results (from the past 240 hour(s))  SARS Coronavirus 2 (CEPHEID - Performed in [place] hospital lab), Hosp Order     Status: None   Collection Time: 09/27/18  1:37 PM   Specimen: Nasopharyngeal Swab  Result Value Ref Range Status   SARS Coronavirus 2 NEGATIVE NEGATIVE Final    Comment: (NOTE) If result is NEGATIVE SARS-CoV-2 target nucleic acids are NOT DETECTED. The SARS-CoV-2 RNA is generally detectable in upper and lower  respiratory specimens during the acute phase of infection. The lowest  concentration of SARS-CoV-2 viral copies this assay can detect is 250  copies / mL. A negative result does not preclude SARS-CoV-2 infection  and should not be used as the sole basis for treatment or other  patient management decisions.  A negative result may occur with  improper specimen collection / handling, submission of specimen other  than nasopharyngeal swab, presence of viral mutation(s) within the  areas targeted by this assay, and inadequate number  of viral copies  (<250 copies / mL). A negative result must be combined with clinical  observations, patient history, and epidemiological information. If result is POSITIVE SARS-CoV-2 target nucleic acids are DETECTED. The SARS-CoV-2 RNA is generally detectable in upper and lower  respiratory specimens dur ing the acute phase of infection.  Positive  results are indicative of active infection with SARS-CoV-2.  Clinical  correlation with patient history and other diagnostic information is  necessary to determine patient infection status.  Positive results do  not rule out bacterial infection or co-infection with other viruses. If result is PRESUMPTIVE POSTIVE SARS-CoV-2 nucleic acids MAY BE PRESENT.   A presumptive positive result was obtained on the submitted specimen  and confirmed on repeat  testing.  While 2019 novel coronavirus  (SARS-CoV-2) nucleic acids may be present in the submitted sample  additional confirmatory testing may be necessary for epidemiological  and / or clinical management purposes  to differentiate between  SARS-CoV-2 and other Sarbecovirus currently known to infect humans.  If clinically indicated additional testing with an alternate test  methodology [phone]) is advised. The SARS-CoV-2 RNA is generally  detectable in upper and lower respiratory sp ecimens during the acute  phase of infection. The expected result is Negative. Fact Sheet for Patients:  StrictlyIdeas.no Fact Sheet for Healthcare Providers: BankingDealers.co.za This test is not yet approved or cleared by the [place] FDA and has been authorized for detection and/or diagnosis of SARS-CoV-2 by FDA under an Emergency Use Authorization (EUA).  This EUA will remain in effect (meaning this test can be used) for the duration of the COVID-19 declaration under Section 564(b)(1) of the Act, 21 U.S.C. section 360bbb-3(b)(1), unless the authorization is terminated or revoked sooner. Performed at [facility], [place] [address], [place], [place] [address]   MRSA PCR Screening     Status: None   Collection Time: 09/27/18  5:09 PM   Specimen: Nasopharyngeal  Result Value Ref Range Status   MRSA by PCR NEGATIVE NEGATIVE Final    Comment:        The GeneXpert MRSA Assay (FDA approved for NASAL specimens only), is one component of a comprehensive MRSA colonization surveillance program. It is not intended to diagnose MRSA infection nor to guide or monitor treatment for MRSA infections. Performed at [facility], [place] [address], [place], [place] [address]          Radiology Studies: Ct Angio Chest Pe W Or Wo Contrast  Result Date: 09/27/2018 CLINICAL DATA:  Shortness of breath, chest pressure, atrial fibrillation EXAM: CT  ANGIOGRAPHY CHEST WITH CONTRAST TECHNIQUE: Multidetector CT imaging of the chest was performed using the standard protocol during bolus administration of intravenous contrast. Multiplanar CT image reconstructions and MIPs were obtained to evaluate the vascular anatomy. CONTRAST:  36mL OMNIPAQUE IOHEXOL 350 MG/ML SOLN COMPARISON:  10/22/2013 and previous FINDINGS: Cardiovascular: No pericardial effusion. Right atrial enlargement. Contrast reflux from the RA into hepatic veins. Dilated RV. Dilated central pulmonary arteries. Central embolus in the left pulmonary artery extending and occluding segmental branches. Multiple segmental emboli in the right pulmonary arterial tree. RV/LV ratio at least 1.5. Incomplete opacification of the thoracic aorta without evidence of aneurysm. Scattered calcified plaque in the arch and descending thoracic segment. Mediastinum/Nodes: No hilar or mediastinal adenopathy. Lungs/Pleura: No pleural effusion. No pneumothorax. Significant decrease in size of right upper lobe cavitary lesion with residual pleural based 2.5 cm opacity. Calcified granuloma in the medial basal segment right lower lobe. Upper  Abdomen: No acute findings. Musculoskeletal: Anterior vertebral endplate spurring at multiple levels in the mid and lower thoracic spine. Widening of the right T8-9 foramen and remodeling of the posterior cortex T8 vertebral body stable since 10/22/2013. Bilateral shoulder and sternoclavicular DJD. No fracture or worrisome bone lesion. Review of the MIP images confirms the above findings. IMPRESSION: 1. Bilateral pulmonary emboli with CT evidence of right heart strain (RV/LV ratio at least 1.5) consistent with at least submassive (intermediate risk) PE. The presence of right heart strain has been associated with an increased risk of morbidity and mortality. 2. Interval decrease in size of right upper lobe cavitary lesion with residual pleural based opacity. 3.  Aortic Atherosclerosis  (ICD10-I70.0). Electronically Signed   By: Lucrezia Europe M.D.   On: 09/27/2018 13:03   Vas [place] Lower Extremity Venous (dvt)  Result Date: 09/28/2018  Lower Venous Study Indications: Pulmonary embolism.  Comparison Study: no prior Performing Technologist: June Leap RDMS, RVT  Examination Guidelines: A complete evaluation includes B-mode imaging, spectral Doppler, color Doppler, and power Doppler as needed of all accessible portions of each vessel. Bilateral testing is considered an integral part of a complete examination. Limited examinations for reoccurring indications may be performed as noted.  +---------+---------------+---------+-----------+----------+-------+  RIGHT     Compressibility Phasicity Spontaneity Properties Summary  +---------+---------------+---------+-----------+----------+-------+  CFV       Full            Yes       Yes                             +---------+---------------+---------+-----------+----------+-------+  SFJ       Full                                                      +---------+---------------+---------+-----------+----------+-------+  FV Prox   Full                                                      +---------+---------------+---------+-----------+----------+-------+  FV Mid    Full                                                      +---------+---------------+---------+-----------+----------+-------+  FV Distal Full                                                      +---------+---------------+---------+-----------+----------+-------+  PFV       Full                                                      +---------+---------------+---------+-----------+----------+-------+  POP       Full  Yes       Yes                             +---------+---------------+---------+-----------+----------+-------+  PTV       Full                                                      +---------+---------------+---------+-----------+----------+-------+  PERO      Full                                                       +---------+---------------+---------+-----------+----------+-------+   +---------+---------------+---------+-----------+----------+-------+  LEFT      Compressibility Phasicity Spontaneity Properties Summary  +---------+---------------+---------+-----------+----------+-------+  CFV       Full            Yes       Yes                             +---------+---------------+---------+-----------+----------+-------+  SFJ       Full                                                      +---------+---------------+---------+-----------+----------+-------+  FV Prox   Full                                                      +---------+---------------+---------+-----------+----------+-------+  FV Mid    Full                                                      +---------+---------------+---------+-----------+----------+-------+  FV Distal Full                                                      +---------+---------------+---------+-----------+----------+-------+  PFV       Full                                                      +---------+---------------+---------+-----------+----------+-------+  POP       Full            Yes       Yes                             +---------+---------------+---------+-----------+----------+-------+  PTV       Full                                                      +---------+---------------+---------+-----------+----------+-------+  PERO      Full                                                      +---------+---------------+---------+-----------+----------+-------+     Summary: Right: There is no evidence of deep vein thrombosis in the lower extremity. No cystic structure found in the popliteal fossa. Left: There is no evidence of deep vein thrombosis in the lower extremity. No cystic structure found in the popliteal fossa.  *See table(s) above for measurements and observations. Electronically signed by Servando Snare MD on 09/28/2018 at 4:11:53 PM.     Final         Scheduled Meds:  irbesartan  300 mg Oral Daily   And   amLODipine  5 mg Oral Daily   And   hydrochlorothiazide  25 mg Oral Daily   citalopram  20 mg Oral Daily   cycloSPORINE  1 drop Both Eyes Q12H   latanoprost  1 drop Both Eyes QHS   pantoprazole  40 mg Oral Daily   vitamin C  250 mg Oral Daily   Continuous Infusions:  heparin 800 Units/hr (09/28/18 1620)     LOS: 2 days        Aline August, MD [facility] 09/29/2018, 11:59 AM

## 2018-09-30 DIAGNOSIS — E876 Hypokalemia: Secondary | ICD-10-CM

## 2018-09-30 LAB — CBC
HCT: 26.6 % — ABNORMAL LOW (ref 36.0–46.0)
Hemoglobin: 8.5 g/dL — ABNORMAL LOW (ref 12.0–15.0)
MCH: 22.7 pg — ABNORMAL LOW (ref 26.0–34.0)
MCHC: 32 g/dL (ref 30.0–36.0)
MCV: 70.9 fL — ABNORMAL LOW (ref 80.0–100.0)
Platelets: 175 10*3/uL (ref 150–400)
RBC: 3.75 MIL/uL — ABNORMAL LOW (ref 3.87–5.11)
RDW: 14.2 % (ref 11.5–15.5)
WBC: 8 10*3/uL (ref 4.0–10.5)
nRBC: 0 % (ref 0.0–0.2)

## 2018-09-30 LAB — BASIC METABOLIC PANEL
Anion gap: 8 (ref 5–15)
BUN: 25 mg/dL — ABNORMAL HIGH (ref 8–23)
CO2: 25 mmol/L (ref 22–32)
Calcium: 9 mg/dL (ref 8.9–10.3)
Chloride: 104 mmol/L (ref 98–111)
Creatinine, Ser: 1.08 mg/dL — ABNORMAL HIGH (ref 0.44–1.00)
GFR calc Af Amer: 53 mL/min — ABNORMAL LOW (ref >60)
GFR calc non Af Amer: 46 mL/min — ABNORMAL LOW (ref >60)
Glucose, Bld: 111 mg/dL — ABNORMAL HIGH (ref 70–99)
Potassium: 3.1 mmol/L — ABNORMAL LOW (ref 3.5–5.1)
Sodium: 137 mmol/L (ref 135–145)

## 2018-09-30 LAB — MAGNESIUM: Magnesium: 1.7 mg/dL (ref 1.7–2.4)

## 2018-09-30 MED ORDER — ELIQUIS 5 MG VTE STARTER PACK: ORAL_TABLET | ORAL | 0 refills | Status: AC

## 2018-09-30 MED ORDER — POTASSIUM CHLORIDE CRYS ER 20 MEQ PO TBCR
40.0000 meq | EXTENDED_RELEASE_TABLET | Freq: Once | ORAL | Status: AC
  Administered 2018-09-30: 15:00:00 40 meq via ORAL
  Filled 2018-09-30: qty 2

## 2018-09-30 MED ORDER — POTASSIUM CHLORIDE CRYS ER 20 MEQ PO TBCR
40.0000 meq | EXTENDED_RELEASE_TABLET | Freq: Once | ORAL | Status: AC
  Administered 2018-09-30: 07:00:00 40 meq via ORAL
  Filled 2018-09-30: qty 2

## 2018-09-30 NOTE — TOC Transition Note (Addendum)
Transition of Care [facility]) - CM/SW Discharge Note   Patient Details  Name: Brianna Hunter MRN: [id number] Date of Birth: [birth year]  Transition of Care [facility]) CM/SW Contact:  Alexander Mt, [place] Phone Number: 09/30/2018, 3:54 PM   Clinical Narrative:    5:04pm- Pt daughter confirms pt can afford copay, she will pick pt up at 5:45pm and is aware 3-in-1 is in room. Pt RN Bernadette aware.   3:54pm- CSW has received benefits check, return call made to pt daughter May. Await confirmation of ability to afford copay. RN aware of 30 day free card, 3 in 1 to be delivered to room. Pt daughter will transport pt in personal car and she is aware that prescriptions were sent to pharmacy at [facility] on [address].      Barriers to Discharge: Equipment Delay   Patient Goals and CMS Choice Patient states their goals for this hospitalization and ongoing recovery are:: for her to get therapy consistently at home CMS Medicare.gov Compare Post Acute Care list provided to:: Patient Represenative (must comment)(pt daughter) Choice offered to / list presented to : Adult Children  Discharge Placement  Patient to be transferred to facility by: personal car Name of family member notified: Pt daughter May by phone Patient and family notified of of transfer: 09/30/18  Discharge Plan and Services In-house Referral: Clinical Social Work Discharge Planning Services: [employer] Consult Post Acute Care Choice: Home Health, Durable Medical Equipment          DME Arranged: 3-N-1 DME Agency: [employer] Date DME Agency Contacted: 09/30/18 Time DME Agency Contacted: [phone] Representative spoke with at DME Agency: Zack HH Arranged: PT, OT, RN [place] Agency: [facility] [employer] Date [place]: 09/30/18 Time [place]: [place] Representative spoke with at [place]: [place]) Interventions     Readmission Risk Interventions No flowsheet  data found.

## 2018-09-30 NOTE — TOC Benefit Eligibility Note (Signed)
Transition of Care [facility]) Benefit Eligibility Note    Patient Details  Name: Brianna Hunter MRN: [id number] Date of Birth: 1928/07/13   Medication/Dose: Arne Cleveland  5 MG BID  Covered?: Yes  Tier: 3 Drug  Prescription Coverage Preferred Pharmacy: Roseanne Kaufman with Person/Company/Phone Number:: [facility] [employer] [facility] # [phone]  Co-Pay: $8.95  Prior Approval: No  Deductible: Met(LOWE INCOME SUBSIDY)  Additional Notes: XARELTO 15 MG BID, COVER- YES, CO-PAY- $8.95, TIER- 3 DRUG, P/A NO    Memory Argue Phone Number: 09/30/2018, 3:41 PM

## 2018-09-30 NOTE — Social Work (Addendum)
1:54pm- Additional HIPAA compliant message left for pt daughter at [phone]. Await return call.  1:04pm- After speaking with bedside RN, attempted pt daughter work phone listed as [phone]. Number not in service.   12:10am- CSW left HIPAA secure message for pt daughter May at [phone]. No answer, await return call to discuss discharge planning.  Westley Hummer, MSW, [place] Work [phone]

## 2018-09-30 NOTE — Progress Notes (Signed)
Physical Therapy Treatment Patient Details Name: Brianna Hunter MRN: [id number] DOB: 13-Sep-1928 Today's Date: 09/30/2018    History of Present Illness 85 year old female with history of left breast DCIS status lumpectomy treated with tamoxifen, hypertension, hyperlipidemia, dementia, anxiety, valvular heart disease and bifascicular block, admission in May 2020 for fall and A. fib with RVR which subsequently converted to sinus rhythm and was not started on anticoagulation because of fall/dementia and advanced age and subsequent discharge to SNF.  She presented on 09/27/2018 for a brief period of unresponsiveness and was found to have A. fib with RVR along with PE with right heart strain.  She was admitted to ICU service and started on heparin drip.    PT Comments    Improving gait stability, but still needing the RW and guarding at this point.    Follow Up Recommendations  Home health PT;Supervision/Assistance - 24 hour     Equipment Recommendations  None recommended by PT    Recommendations for Other Services       Precautions / Restrictions Precautions Precautions: Fall    Mobility  Bed Mobility               General bed mobility comments: NT  Transfers Overall transfer level: Needs assistance   Transfers: Sit to/from Stand Sit to Stand: Min assist         General transfer comment: cues for hand placement/direction.  Assist to help come forward and stability  Ambulation/Gait Ambulation/Gait assistance: Min guard;Min assist Gait Distance (Feet): 240 Feet Assistive device: Rolling walker (2 wheeled) Gait Pattern/deviations: Step-through pattern Gait velocity: slower   General Gait Details: improving steadiness.  Some drift.  Less dyspneic.  Able to hold conversation on generally focus on gait task.   Stairs             Wheelchair Mobility    Modified Rankin (Stroke Patients Only)       Balance Overall balance assessment: Needs assistance    Sitting balance-Leahy Scale: Fair       Standing balance-Leahy Scale: Poor Standing balance comment: reliant on AD and external support.                            Cognition Arousal/Alertness: Awake/alert Behavior During Therapy: WFL for tasks assessed/performed Overall Cognitive Status: History of cognitive impairments - at baseline                                        Exercises      General Comments        Pertinent Vitals/Pain Faces Pain Scale: No hurt    Home Living                      Prior Function            PT Goals (current goals can now be found in the care plan section) Acute Rehab PT Goals PT Goal Formulation: With patient Time For Goal Achievement: 10/06/18 Potential to Achieve Goals: Good Progress towards PT goals: Progressing toward goals    Frequency    Min 3X/week      PT Plan Current plan remains appropriate    Co-evaluation              AM-PAC PT "6 Clicks" Mobility   Outcome Measure  Help needed turning from your back  to your side while in a flat bed without using bedrails?: A Little Help needed moving from lying on your back to sitting on the side of a flat bed without using bedrails?: A Little Help needed moving to and from a bed to a chair (including a wheelchair)?: A Little Help needed standing up from a chair using your arms (e.g., wheelchair or bedside chair)?: A Little Help needed to walk in hospital room?: A Little Help needed climbing 3-5 steps with a railing? : A Little 6 Click Score: 18    End of Session   Activity Tolerance: Patient limited by fatigue Patient left: in chair;with call bell/phone within reach;with chair alarm set Nurse Communication: Mobility status PT Visit Diagnosis: Unsteadiness on feet (R26.81)     Time: 5597-4163 PT Time Calculation (min) (ACUTE ONLY): 16 min  Charges:  $Gait Training: 8-22 mins                     09/30/2018  Donnella Sham,  [place] [phone]  (pager) [phone]  (office)   Tessie Fass Tucker Steedley 09/30/2018, 5:53 PM

## 2018-09-30 NOTE — Progress Notes (Signed)
Contacted Case Manager, Debra [phone]) r/t home services for pt. Awaiting confirmation.

## 2018-09-30 NOTE — TOC Initial Note (Signed)
Transition of Care [facility]) - Initial/Assessment Note    Patient Details  Name: Brianna Hunter MRN: [id number] Date of Birth: [birth year]  Transition of [facility]) CM/SW Contact:    Alexander Mt, [place] Phone Number: 09/30/2018, 2:43 PM  Clinical Narrative:                 CSW spoke with pt daughter May on the phone, introduced self, role, reason for call. Pt from home with the assistance of her daughter who works at home but provides 24/7 assistance.  Pt recently in May went to [facility]. Pt daughter states that at that time she had a hard time with her mother being away and due to COVID restrictions not being able to visit her and be part of her care. [facility] and she felt they were helpful but was challenged balancing pt's care with work. She is agreeable to home health again through [facility]. She does request a 3 in 1 bedside commode, her mother already has a cane and walker at home.  Referral made to [facility] [place], 3 in 1 ordered through [place].  Prescriptions were sent to [facility] on [address], will updated daughter as this was not what she recalled the pharmacy to be. Pt has had a recent PCP appointment, daughter will schedule a follow up.   Await benefits check, 30 day free Eliquis card tubed to [facility] staff member, RN Bernadette aware.   Expected Discharge Plan: [place] Barriers to Discharge: Equipment Delay   Patient Goals and CMS Choice Patient states their goals for this hospitalization and ongoing recovery are:: for her to get therapy consistently at home CMS Medicare.gov Compare Post Acute Care list provided to:: Patient Represenative (must comment)(pt daughter) Choice offered to / list presented to : Adult Children  Expected Discharge Plan and Services Expected Discharge Plan: [place] In-house Referral: Clinical Social Work Discharge Planning Services: CM  Consult Post Acute Care Choice: Home Health, Durable Medical Equipment Living arrangements for the past 2 months: [place], Apartment                 DME Arranged: 3-N-1 DME Agency: [employer]     Representative spoke with at DME Agency: [place]: PT, OT, RN [place] Agency: [facility] [employer] Date [place]: 09/30/18 Time [place]: 7 Representative spoke with at [place]: [place] Arrangements/Services Living arrangements for the past 2 months: [place], [place] Lives with:: Self, Adult Children Patient language and need for interpreter reviewed:: Yes(no needs) Do you feel safe going back to the place where you live?: Yes      Need for Family Participation in Patient Care: Yes (Comment)(decision making; support) Care giver support system in place?: Yes (comment)(adult children) Current home services: DME Criminal Activity/Legal Involvement Pertinent to Current Situation/Hospitalization: No - Comment as needed  Activities of Daily Living      Permission Sought/Granted Permission sought to share information with : Family Supports, PCP Permission granted to share information with : No(pt only oriented to self)  Share Information with NAME: May Hartsell  Permission granted to share info w AGENCY: [facility] [employer] granted to share info w Relationship: daughter  Permission granted to share info w Contact Information: [phone]  Emotional Assessment Appearance:: (spoke with pt daughter) Attitude/Demeanor/Rapport: (spoke with pt daughter) Affect (typically  observed): (spoke wth pt daughter) Orientation: : Oriented to Self Alcohol / Substance Use: Not Applicable Psych Involvement: No (comment)  Admission diagnosis:  Other acute pulmonary embolism with acute cor pulmonale (HCC) [I26.09] Pulmonary embolism ([place]) [I26.99] Patient Active Problem List   Diagnosis Date Noted   . Pulmonary embolism ([place]) 09/27/2018  . Weakness generalized   . Palliative care by specialist   . DNR (do not resuscitate) discussion   . Protein-calorie malnutrition, severe 07/16/2018  . Pressure injury of skin 07/16/2018  . Acute kidney injury (AKI) with acute tubular necrosis (ATN) ([place]) 07/14/2018  . Fall at home, initial encounter 07/13/2018  . Lactic acidosis 07/13/2018  . AKI (acute kidney injury) ([place]) 07/13/2018  . High anion gap metabolic acidosis 02/63/7858  . Elevated liver enzymes 07/13/2018  . Thrombocytopenia ([place]) 07/13/2018  . Elevated troponin 07/13/2018  . Atrial fibrillation with RVR ([place]) 07/13/2018  . Microcytic anemia 10/15/2013  . Poor dentition 08/09/2013  . Acute respiratory failure with hypoxia ([place]) 07/12/2013  . CAP (community acquired pneumonia) 07/11/2013  . Sepsis ([place]) 07/09/2013  . Cavitary lesion of lung 07/09/2013  . Leukocytosis 07/09/2013  . Protein-calorie malnutrition, moderate ([place]) 07/09/2013  . Hemoptysis 07/09/2013  . 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 ([place]) 10/07/2011  . Valvular heart disease 08/22/2010  . HYPERLIPIDEMIA-MIXED 09/06/2009  . AORTIC & MITRAL STENOSIS W/ INSUFFI, RHEUM/NON-RHEUM 09/06/2009  . HYPERTENSION, BENIGN 09/06/2009  . AORTIC STENOSIS/ INSUFFICIENCY, NON-RHEUMATIC 09/06/2009  . PALPITATIONS 09/06/2009  . ABDOMINAL MASS 09/06/2009   PCP:  [name], MD Pharmacy:   [facility] [place], [address] [place] [address] Phone: [phone] Fax: [phone]     Social Determinants of Health (SDOH) Interventions    Readmission Risk Interventions No flowsheet data found.

## 2018-09-30 NOTE — Care Management Important Message (Signed)
Important Message  Patient Details  Name: Brianna Hunter MRN: [id number] Date of Birth: 1929-01-31   Medicare Important Message Given:  Yes     Memory Argue 09/30/2018, 4:04 PM    SIGNED

## 2018-09-30 NOTE — Discharge Summary (Signed)
Physician Discharge Summary  Devyn Griffing ZDG:[id number] DOB: 09-05-1928  PCP: [name], MD  Admit date: 09/27/2018 Discharge date: 09/30/2018  Recommendations for Outpatient Follow-up:  1. Dr. Beatrix Shipper, PCP in 1 week with repeat labs (CBC & BMP).  Home Health: PT, OT and RN. Equipment/Devices: 3 n 1    Discharge Condition: Improved and stable. CODE STATUS: Full. Diet recommendation: Heart healthy diet.  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 fib with RVR which subsequently converted to sinus rhythm and was not started on anticoagulation because of fall/dementia and advanced age and subsequently discharge to SNF. She presented on 09/27/2018 for a brief period of unresponsiveness and was found to have A. fib with RVR along with PE with right heart strain.  She was admitted to ICU service and started on heparin drip.  After she was stabilized, she was transferred to the Hospitalist service on 09/29/2018.  Assessment & Plan:   Acute submassive pulmonary embolism  -CT chest on admission showed bilateral PE with right ventricular strain consistent with submassive PE. With stable hemodynamics and her age, risk of complications from thrombolysis was deemed to be very high.  She was admitted to ICU. -Started on heparin drip. -Lower extremity duplex negative for DVT. -Currently on room air and not hypoxic. -Echo showed EF of 50 to 55% without evidence of right heart strain. -Previous Hospitalist MD talked to daughter on phone and decided to start patient on Eliquis.   -Eliquis was started on 09/29/2018 and patient has received 3 doses thus far.  No  bleeding reported.  Case management consulted for medication needs/free discount card. - Etiology of her PE is unclear and consider further evaluation as OP as deemed necessary.  Paroxysmal A. fib with RVR -Currently in sinus rhythm.  Heart rate controlled.  Eliquis plan as above.  Patient not on any rate control medications.  Probable acute metabolic encephalopathy History of dementia -Patient had an episode of unresponsiveness initially which has resolved.  Details and etiology of this are not clear. Patient is back to probably her baseline. -If she has recurrent episodes of unresponsiveness or altered mental status, may need further evaluation. -Driving restrictions counseled but not sure if she even drives.  Hypomagnesemia -Replaced.  Hypokalemia -Replaced prior to discharge.  Closely follow BMP in a week's time as outpatient and if has recurrent hypokalemia, consider adding low-dose potassium given that she is on HCTZ.  Anemia of chronic disease -Hemoglobin stable.  Periodically follow CBC as outpatient. -She does have microcytic anemia.  Thrombocytopenia -Questionable cause.  Resolved  Generalized deconditioning -Home health therapies arranged.  Patient reports that her daughter lives and works from home and is available 24/7 to assist.   Consultants:  PCCM  Procedures:  Echo  IMPRESSIONS   1. The left ventricle has low normal systolic function, with an ejection fraction of 50-55%. The cavity size was normal. Left ventricular diastolic Doppler parameters are consistent with impaired relaxation. 2. The right ventricle has normal systolic function. The cavity was normal. 3. The aortic root is normal in size and structure. 4. The tricuspid valve is grossly normal. 5. The interatrial septum is aneurysmal. 6. The aortic valve is tricuspid. Mild thickening of the aortic valve.  Aortic valve regurgitation is mild by color flow Doppler. No stenosis of the  aortic valve. 7. Trivial pericardial effusion is present. 8. The mitral valve is grossly normal. 9. Low normal LV systolic function; mild diastolic dysfunction; midl AI; mild RV dysfunction; mild MR and TR; estimated RVSP mildly elevated.   Discharge Instructions  Discharge Instructions    Call MD for:   Complete by: As directed    Passing out.   Call MD for:  difficulty breathing, headache or visual disturbances   Complete by: As directed    Call MD for:  extreme fatigue   Complete by: As directed    Call MD for:  persistant dizziness or light-headedness   Complete by: As directed    Call MD for:  persistant nausea and vomiting   Complete by: As directed    Call MD for:  severe uncontrolled pain   Complete by: As directed    Call MD for:  temperature >100.4   Complete by: As directed    Diet - low sodium heart healthy   Complete by: As directed    Driving Restrictions   Complete by: As directed    No driving for 6 months.   Increase activity slowly   Complete by: As directed        Medication List    STOP taking these medications   feeding supplement Liqd     TAKE these medications   acetaminophen 500 MG tablet Commonly known as: TYLENOL Take 1,000 mg by mouth every 6 (six) hours as needed for mild pain.   bimatoprost 0.01 % Soln Commonly known as: LUMIGAN Place 1 drop into both eyes at bedtime.   citalopram 20 MG tablet Commonly known as: CELEXA Take 20 mg by mouth daily.   cycloSPORINE 0.05 % ophthalmic emulsion Commonly known as: RESTASIS Place 1 drop into both eyes every 12 (twelve) hours.   Eliquis DVT/PE Starter Pack 5 MG Tabs Take as directed on package: start with two-5mg  tablets twice daily for 7 days. On day 8, switch to one-5mg  tablet twice daily.   Olmesartan-amLODIPine-HCTZ 40-5-25 MG Tabs Take 1 tablet by mouth daily.   VITAMIN C PO Take 1 tablet by mouth daily.      Follow-up Information    [name], MD. Schedule an  appointment as soon as possible for a visit in 1 week(s).   Specialty: Family Medicine Why: To be seen with repeat labs (CBC & BMP). Contact information: [address] [place]  [id number] [phone]          Allergies  Allergen Reactions  . Milk-Related Compounds Other (See Comments)    Frequent bowel movements   . Augmentin [Amoxicillin-Pot Clavulanate] Diarrhea      Procedures/Studies: Ct Angio Chest Pe W Or Wo Contrast  Result Date: 09/27/2018 CLINICAL DATA:  Shortness of breath, chest pressure, atrial fibrillation EXAM: CT ANGIOGRAPHY CHEST WITH CONTRAST TECHNIQUE: Multidetector CT imaging of the chest was performed using the standard protocol during bolus administration of intravenous contrast. Multiplanar CT image reconstructions and MIPs were obtained to evaluate the vascular anatomy. CONTRAST:  1mL OMNIPAQUE IOHEXOL 350 MG/ML SOLN COMPARISON:  10/22/2013 and previous FINDINGS: Cardiovascular: No pericardial effusion. Right atrial enlargement. Contrast reflux from the RA into hepatic veins. Dilated RV. Dilated central pulmonary arteries. Central embolus in the left pulmonary artery extending and occluding segmental branches. Multiple segmental emboli in the right pulmonary arterial tree. RV/LV ratio at least 1.5. Incomplete opacification of the thoracic aorta  without evidence of aneurysm. Scattered calcified plaque in the arch and descending thoracic segment. Mediastinum/Nodes: No hilar or mediastinal adenopathy. Lungs/Pleura: No pleural effusion. No pneumothorax. Significant decrease in size of right upper lobe cavitary lesion with residual pleural based 2.5 cm opacity. Calcified granuloma in the medial basal segment right lower lobe. Upper Abdomen: No acute findings. Musculoskeletal: Anterior vertebral endplate spurring at multiple levels in the mid and lower thoracic spine. Widening of the right T8-9 foramen and remodeling of the posterior cortex T8 vertebral body stable  since 10/22/2013. Bilateral shoulder and sternoclavicular DJD. No fracture or worrisome bone lesion. Review of the MIP images confirms the above findings. IMPRESSION: 1. Bilateral pulmonary emboli with CT evidence of right heart strain (RV/LV ratio at least 1.5) consistent with at least submassive (intermediate risk) PE. The presence of right heart strain has been associated with an increased risk of morbidity and mortality. 2. Interval decrease in size of right upper lobe cavitary lesion with residual pleural based opacity. 3.  Aortic Atherosclerosis (ICD10-I70.0). Electronically Signed   By: Lucrezia Europe M.D.   On: 09/27/2018 13:03   Vas [place] Lower Extremity Venous (dvt)  Result Date: 09/28/2018  Lower Venous Study Indications: Pulmonary embolism.  Comparison Study: no prior Performing Technologist: June Leap RDMS, RVT  Examination Guidelines: A complete evaluation includes B-mode imaging, spectral Doppler, color Doppler, and power Doppler as needed of all accessible portions of each vessel. Bilateral testing is considered an integral part of a complete examination. Limited examinations for reoccurring indications may be performed as noted.  +---------+---------------+---------+-----------+----------+-------+ RIGHT    CompressibilityPhasicitySpontaneityPropertiesSummary +---------+---------------+---------+-----------+----------+-------+ CFV      Full           Yes      Yes                          +---------+---------------+---------+-----------+----------+-------+ SFJ      Full                                                 +---------+---------------+---------+-----------+----------+-------+ FV Prox  Full                                                 +---------+---------------+---------+-----------+----------+-------+ FV Mid   Full                                                 +---------+---------------+---------+-----------+----------+-------+ FV DistalFull                                                  +---------+---------------+---------+-----------+----------+-------+ PFV      Full                                                 +---------+---------------+---------+-----------+----------+-------+ POP  Full           Yes      Yes                          +---------+---------------+---------+-----------+----------+-------+ PTV      Full                                                 +---------+---------------+---------+-----------+----------+-------+ PERO     Full                                                 +---------+---------------+---------+-----------+----------+-------+   +---------+---------------+---------+-----------+----------+-------+ LEFT     CompressibilityPhasicitySpontaneityPropertiesSummary +---------+---------------+---------+-----------+----------+-------+ CFV      Full           Yes      Yes                          +---------+---------------+---------+-----------+----------+-------+ SFJ      Full                                                 +---------+---------------+---------+-----------+----------+-------+ FV Prox  Full                                                 +---------+---------------+---------+-----------+----------+-------+ FV Mid   Full                                                 +---------+---------------+---------+-----------+----------+-------+ FV DistalFull                                                 +---------+---------------+---------+-----------+----------+-------+ PFV      Full                                                 +---------+---------------+---------+-----------+----------+-------+ POP      Full           Yes      Yes                          +---------+---------------+---------+-----------+----------+-------+ PTV      Full                                                  +---------+---------------+---------+-----------+----------+-------+ PERO     Full                                                 +---------+---------------+---------+-----------+----------+-------+  Summary: Right: There is no evidence of deep vein thrombosis in the lower extremity. No cystic structure found in the popliteal fossa. Left: There is no evidence of deep vein thrombosis in the lower extremity. No cystic structure found in the popliteal fossa.  *See table(s) above for measurements and observations. Electronically signed by Servando Snare MD on 09/28/2018 at 4:11:53 PM.    Final       Subjective: Patient denies complaints.  No chest pain, dyspnea, dizziness or lightheadedness reported.  Appears coherent and able to provide appropriate responses to questions asked.  As per RN, no acute issues noted.  No bleeding reported.  Discharge Exam:  Vitals:   09/29/18 2354 09/30/18 0319 09/30/18 0812 09/30/18 1100  BP: 115/74 112/69 100/63   Pulse: 74  77 (!) 57  Resp: 16 18 18 19   Temp: (!) 97.5 F (36.4 C) 98.5 F (36.9 C) (!) 97.5 F (36.4 C)   TempSrc: Oral  Oral   SpO2: 100% 98% 97% 99%  Weight:      Height:        General: Pleasant elderly female, small built and frail lying comfortably propped up in bed without distress.  Oral mucosa moist. Cardiovascular: S1 & S2 heard, RRR, S1/S2 +. No murmurs, rubs, gallops or clicks. No JVD or pedal edema.  Telemetry personally reviewed: Sinus rhythm. Respiratory: Clear to auscultation without wheezing, rhonchi or crackles. No increased work of breathing. Abdominal:  Non distended, non tender & soft. No organomegaly or masses appreciated. Normal bowel sounds heard. CNS: Alert and oriented to person, place and partly to time.  No focal neurological deficits. Extremities: no edema, no cyanosis.  Symmetric 5 x 5 power.    The results of significant diagnostics from this hospitalization (including imaging, microbiology, ancillary and  laboratory) are listed below for reference.     Microbiology: Recent Results (from the past 240 hour(s))  SARS Coronavirus 2 (CEPHEID - Performed in [place] hospital lab), Hosp Order     Status: None   Collection Time: 09/27/18  1:37 PM   Specimen: Nasopharyngeal Swab  Result Value Ref Range Status   SARS Coronavirus 2 NEGATIVE NEGATIVE Final    Comment: (NOTE) If result is NEGATIVE SARS-CoV-2 target nucleic acids are NOT DETECTED. The SARS-CoV-2 RNA is generally detectable in upper and lower  respiratory specimens during the acute phase of infection. The lowest  concentration of SARS-CoV-2 viral copies this assay can detect is 250  copies / mL. A negative result does not preclude SARS-CoV-2 infection  and should not be used as the sole basis for treatment or other  patient management decisions.  A negative result may occur with  improper specimen collection / handling, submission of specimen other  than nasopharyngeal swab, presence of viral mutation(s) within the  areas targeted by this assay, and inadequate number of viral copies  (<250 copies / mL). A negative result must be combined with clinical  observations, patient history, and epidemiological information. If result is POSITIVE SARS-CoV-2 target nucleic acids are DETECTED. The SARS-CoV-2 RNA is generally detectable in upper and lower  respiratory specimens dur ing the acute phase of infection.  Positive  results are indicative of active infection with SARS-CoV-2.  Clinical  correlation with patient history and other diagnostic information is  necessary to determine patient infection status.  Positive results do  not rule out bacterial infection or co-infection with other viruses. If result is PRESUMPTIVE POSTIVE SARS-CoV-2 nucleic acids MAY BE PRESENT.   A presumptive  positive result was obtained on the submitted specimen  and confirmed on repeat testing.  While 2019 novel coronavirus  (SARS-CoV-2) nucleic acids may  be present in the submitted sample  additional confirmatory testing may be necessary for epidemiological  and / or clinical management purposes  to differentiate between  SARS-CoV-2 and other Sarbecovirus currently known to infect humans.  If clinically indicated additional testing with an alternate test  methodology [phone]) is advised. The SARS-CoV-2 RNA is generally  detectable in upper and lower respiratory sp ecimens during the acute  phase of infection. The expected result is Negative. Fact Sheet for Patients:  StrictlyIdeas.no Fact Sheet for Healthcare Providers: BankingDealers.co.za This test is not yet approved or cleared by the [place] FDA and has been authorized for detection and/or diagnosis of SARS-CoV-2 by FDA under an Emergency Use Authorization (EUA).  This EUA will remain in effect (meaning this test can be used) for the duration of the COVID-19 declaration under Section 564(b)(1) of the Act, 21 U.S.C. section 360bbb-3(b)(1), unless the authorization is terminated or revoked sooner. Performed at [facility], [place] [address] [place], [place] [address]   MRSA PCR Screening     Status: None   Collection Time: 09/27/18  5:09 PM   Specimen: Nasopharyngeal  Result Value Ref Range Status   MRSA by PCR NEGATIVE NEGATIVE Final    Comment:        The GeneXpert MRSA Assay (FDA approved for NASAL specimens only), is one component of a comprehensive MRSA colonization surveillance program. It is not intended to diagnose MRSA infection nor to guide or monitor treatment for MRSA infections. Performed at [facility], [place] [address], [place], [place] [address]      Labs: CBC: Recent Labs  Lab 09/27/18 0904 09/28/18 0558 09/29/18 0216 09/30/18 0223  WBC 6.2 5.4 7.4 8.0  HGB 8.9* 8.9* 9.5* 8.5*  HCT 29.8* 28.6* 29.9* 26.6*  MCV 75.4* 73.1* 71.9* 70.9*  PLT 139* PLATELET CLUMPS NOTED ON SMEAR, UNABLE  TO ESTIMATE 150 962   Basic Metabolic Panel: Recent Labs  Lab 09/27/18 0904 09/28/18 0558 09/29/18 0216 09/30/18 0223  NA 143 140 139 137  K 3.9 3.5 3.5 3.1*  CL 111 108 105 104  CO2 24 23 25 25   GLUCOSE 136* 88 125* 111*  BUN 15 15 12  25*  CREATININE 0.83 0.73 0.79 1.08*  CALCIUM 8.8* 8.9 9.1 9.0  MG  --   --  1.6* 1.7  PHOS  --   --  3.3  --    Liver Function Tests: Recent Labs  Lab 09/27/18 0904  AST 22  ALT 19  ALKPHOS 48  BILITOT 0.6  PROT 6.3*  ALBUMIN 2.5*   Urinalysis    Component Value Date/Time   COLORURINE YELLOW 09/27/2018 1308   APPEARANCEUR CLEAR 09/27/2018 1308   LABSPEC 1.018 09/27/2018 1308   PHURINE 6.0 09/27/2018 1308   GLUCOSEU NEGATIVE 09/27/2018 1308   HGBUR MODERATE (A) 09/27/2018 1308   BILIRUBINUR NEGATIVE 09/27/2018 1308   KETONESUR NEGATIVE 09/27/2018 1308   PROTEINUR 30 (A) 09/27/2018 1308   UROBILINOGEN 0.2 03/28/2014 1429   NITRITE NEGATIVE 09/27/2018 1308   LEUKOCYTESUR NEGATIVE 09/27/2018 1308    I discussed in detail with patient's daughter, updated care and answered questions.  Time coordinating discharge: 40 minutes  SIGNED:  Vernell Leep, MD, FACP, [facility]. [facility]  To contact the attending provider between 7A-7P or the covering provider during after hours 7P-7A, please log into  the web site [email or web address] and access using universal [place] password for that web site. If you do not have the password, please call the hospital operator.

## 2018-10-11 ENCOUNTER — Emergency Department (HOSPITAL_COMMUNITY)
Admission: EM | Admit: 2018-10-11 | Discharge: 2018-10-26 | Disposition: A | Discharge status: Home / Self Care | Payer: Medicare HMO | Attending: Emergency Medicine | Admitting: Emergency Medicine

## 2018-10-11 ENCOUNTER — Other Ambulatory Visit: Payer: Self-pay

## 2018-10-11 ENCOUNTER — Encounter (HOSPITAL_COMMUNITY): Payer: Self-pay | Admitting: Emergency Medicine

## 2018-10-11 DIAGNOSIS — Z022 Encounter for examination for admission to residential institution: Secondary | ICD-10-CM | POA: Diagnosis not present

## 2018-10-11 DIAGNOSIS — Z79899 Other long term (current) drug therapy: Secondary | ICD-10-CM | POA: Insufficient documentation

## 2018-10-11 DIAGNOSIS — Z20828 Contact with and (suspected) exposure to other viral communicable diseases: Secondary | ICD-10-CM | POA: Diagnosis not present

## 2018-10-11 DIAGNOSIS — I10 Essential (primary) hypertension: Secondary | ICD-10-CM | POA: Insufficient documentation

## 2018-10-11 DIAGNOSIS — Z96652 Presence of left artificial knee joint: Secondary | ICD-10-CM | POA: Diagnosis not present

## 2018-10-11 DIAGNOSIS — F0390 Unspecified dementia without behavioral disturbance: Secondary | ICD-10-CM

## 2018-10-11 DIAGNOSIS — Z789 Other specified health status: Secondary | ICD-10-CM

## 2018-10-11 DIAGNOSIS — R001 Bradycardia, unspecified: Secondary | ICD-10-CM | POA: Diagnosis not present

## 2018-10-11 DIAGNOSIS — Z03818 Encounter for observation for suspected exposure to other biological agents ruled out: Secondary | ICD-10-CM | POA: Diagnosis not present

## 2018-10-11 HISTORY — DX: Unspecified dementia, unspecified severity, without behavioral disturbance, psychotic disturbance, mood disturbance, and anxiety: F03.90

## 2018-10-11 MED ORDER — APIXABAN 5 MG PO TABS
5.0000 mg | ORAL_TABLET | Freq: Two times a day (BID) | ORAL | Status: DC
  Administered 2018-10-12 – 2018-10-26 (×29): 5 mg via ORAL
  Filled 2018-10-11 (×32): qty 1

## 2018-10-11 MED ORDER — OLMESARTAN-AMLODIPINE-HCTZ 40-5-25 MG PO TABS: 1.0000 | ORAL_TABLET | Freq: Every day | ORAL | Status: DC

## 2018-10-11 MED ORDER — AMLODIPINE BESYLATE 5 MG PO TABS
5.0000 mg | ORAL_TABLET | Freq: Every day | ORAL | Status: DC
  Administered 2018-10-12 – 2018-10-26 (×8): 5 mg via ORAL
  Filled 2018-10-11 (×13): qty 1

## 2018-10-11 MED ORDER — CYCLOSPORINE 0.05 % OP EMUL
1.0000 [drp] | Freq: Two times a day (BID) | OPHTHALMIC | Status: DC
  Administered 2018-10-12 – 2018-10-26 (×27): 1 [drp] via OPHTHALMIC
  Filled 2018-10-11 (×34): qty 30

## 2018-10-11 MED ORDER — APIXABAN 2.5 MG PO TABS: 2.5000 mg | ORAL_TABLET | Freq: Two times a day (BID) | ORAL | Status: DC

## 2018-10-11 MED ORDER — HYDROCHLOROTHIAZIDE 25 MG PO TABS
25.0000 mg | ORAL_TABLET | Freq: Every day | ORAL | Status: DC
  Administered 2018-10-12 – 2018-10-26 (×9): 25 mg via ORAL
  Filled 2018-10-11 (×13): qty 1

## 2018-10-11 MED ORDER — VITAMIN C 500 MG PO TABS
250.0000 mg | ORAL_TABLET | Freq: Every day | ORAL | Status: DC
  Administered 2018-10-13 – 2018-10-26 (×14): 250 mg via ORAL
  Filled 2018-10-11 (×14): qty 1

## 2018-10-11 MED ORDER — ACETAMINOPHEN 500 MG PO TABS: 1000.0000 mg | ORAL_TABLET | Freq: Four times a day (QID) | ORAL | Status: DC | PRN

## 2018-10-11 MED ORDER — LATANOPROST 0.005 % OP SOLN
1.0000 [drp] | Freq: Every day | OPHTHALMIC | Status: DC
  Administered 2018-10-13 – 2018-10-24 (×11): 1 [drp] via OPHTHALMIC
  Filled 2018-10-11 (×3): qty 2.5

## 2018-10-11 MED ORDER — IRBESARTAN 300 MG PO TABS
300.0000 mg | ORAL_TABLET | Freq: Every day | ORAL | Status: DC
  Administered 2018-10-12 – 2018-10-26 (×10): 300 mg via ORAL
  Filled 2018-10-11 (×14): qty 1

## 2018-10-11 MED ORDER — ELIQUIS 5 MG VTE STARTER PACK: 5.0000 mg | ORAL_TABLET | Freq: Two times a day (BID) | ORAL | Status: DC

## 2018-10-11 NOTE — ED Triage Notes (Addendum)
Patient arrived with her daughter , daughter reported that pt. needs assistance for placement to a facility due to poor living conditions at home - no air conditioning  with molds , no plumbing or heating , pt. has dementia . Pt. denies complaints at triage / respirations unlabored.

## 2018-10-11 NOTE — ED Notes (Signed)
May Perazzo daughter [phone].

## 2018-10-11 NOTE — ED Provider Notes (Signed)
[facility] EMERGENCY DEPARTMENT Provider Note   CSN: [id number] Arrival date & time: 10/11/18  1854   LEVEL 5 CAVEAT - DEMENTIA   History   Chief Complaint Chief Complaint  Patient presents with  . Requesting Nursing Home Placement    HPI Brianna Hunter is a 85 y.o. female.     HPI  85 year old female brought in by her daughter and caregiver.  The patient has been cared for by her daughter for quite some time.  The daughter states she cannot do it anymore and she feels like the patient is not safe in her house.  She is currently working with social work but feels like the patient cannot live with her anymore.  She states she does not have certain things such as heat and cold air.  She is hoping to see a Education officer, museum.  The patient has no medical complaints.  Past Medical History:  Diagnosis Date  . Anxiety   . Arthritis    celebrex use off & on for arthritis- hands, back   . Back pain   . Bifascicular block   . Breast cancer ([place])    a. DCIS s/p R lumpectomy  . Dementia ([place])   . Hyperlipidemia   . Hypertension   . Palpitations    a. holter (7/11):  isolated PVCs and PACs  . Rheumatism   . Valvular heart disease    a. Echo (7/11):  EF 55-60%, Gr 1 DD, mild-mod AI, mild-mod MR, mod PI, PASP 30;  b. Echo (09/2010):  Mild LVH, EF 60%, Gr 1 DD, mild to mod AI, mild MR, mild TR, mild to mod PI, PASP 36.;   c.  Echo (05/2013):  EF 60-65%, no RWMA, mild AI, mild to mod PI     Patient Active Problem List   Diagnosis Date Noted  . Pulmonary embolism ([place]) 09/27/2018  . Weakness generalized   . Palliative care by specialist   . DNR (do not resuscitate) discussion   . Protein-calorie malnutrition, severe 07/16/2018  . Pressure injury of skin 07/16/2018  . Acute kidney injury (AKI) with acute tubular necrosis (ATN) ([place]) 07/14/2018  . Fall at home, initial encounter 07/13/2018  . Lactic acidosis 07/13/2018  . AKI (acute kidney injury) ([place]) 07/13/2018  .  High anion gap metabolic acidosis 60/63/0160  . Elevated liver enzymes 07/13/2018  . Thrombocytopenia ([place]) 07/13/2018  . Elevated troponin 07/13/2018  . Atrial fibrillation with RVR ([place]) 07/13/2018  . Microcytic anemia 10/15/2013  . Poor dentition 08/09/2013  . Acute respiratory failure with hypoxia ([place]) 07/12/2013  . CAP (community acquired pneumonia) 07/11/2013  . Sepsis ([place]) 07/09/2013  . Cavitary lesion of lung 07/09/2013  . Leukocytosis 07/09/2013  . Protein-calorie malnutrition, moderate ([place]) 07/09/2013  . Hemoptysis 07/09/2013  . 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 ([place]) 10/07/2011  . Valvular heart disease 08/22/2010  . HYPERLIPIDEMIA-MIXED 09/06/2009  . AORTIC & MITRAL STENOSIS W/ INSUFFI, RHEUM/NON-RHEUM 09/06/2009  . HYPERTENSION, BENIGN 09/06/2009  . AORTIC STENOSIS/ INSUFFICIENCY, NON-RHEUMATIC 09/06/2009  . PALPITATIONS 09/06/2009  . ABDOMINAL MASS 09/06/2009    Past Surgical History:  Procedure Laterality Date  . ABDOMINAL HYSTERECTOMY     partial   . APPENDECTOMY    . EAR CYST EXCISION  11/07/2011   Procedure: CYST REMOVAL;  Surgeon: Madilyn Hook, DO;  Location: [facility];  Service: General;  Laterality: Right;  Removal Cyst Right Upper Chest  . EYE SURGERY     left eye 2013  .  JOINT REPLACEMENT     2008- L knee  . KNEE SURGERY     Left  . SHOULDER SURGERY     Left     OB History   No obstetric history on file.      Home Medications    Prior to Admission medications   Medication Sig Start Date End Date Taking? Authorizing Provider  acetaminophen (TYLENOL) 500 MG tablet Take 1,000 mg by mouth every 6 (six) hours as needed for mild pain.    [provider]  Ascorbic Acid (VITAMIN C PO) Take 1 tablet by mouth daily.    [provider]  bimatoprost (LUMIGAN) 0.01 % SOLN Place 1 drop into both eyes at bedtime.    [provider]  citalopram (CELEXA) 20 MG tablet Take 20 mg by mouth daily.     [provider]  cycloSPORINE (RESTASIS) 0.05 % ophthalmic emulsion Place 1 drop into both eyes every 12 (twelve) hours.    [provider]  Eliquis DVT/PE Starter Pack (ELIQUIS STARTER PACK) 5 MG TABS Take as directed on package: start with two-5mg  tablets twice daily for 7 days. On day 8, switch to one-5mg  tablet twice daily. 09/30/18   Hongalgi, Lenis Dickinson, MD  Olmesartan-amLODIPine-HCTZ 40-5-25 MG TABS Take 1 tablet by mouth daily.    [provider]    Family History Family History  Problem Relation Age of Onset  . Diabetes Mother   . Other Mother        Bleeding disorder  . Diabetes Father   . Other Father        Bleeding disorder    Social History Social History   Tobacco Use  . Smoking status: Never Smoker  . Smokeless tobacco: Never Used  Substance Use Topics  . Alcohol use: No  . Drug use: No     Allergies   Milk-related compounds and Augmentin [amoxicillin-pot clavulanate]   Review of Systems Review of Systems  Unable to perform ROS: Dementia     Physical Exam Updated Vital Signs BP 124/67 (BP Location: Right Arm)   Pulse (!) 57   Temp 98.2 F (36.8 C) (Oral)   Resp (!) 22   SpO2 100%   Physical Exam Vitals signs and nursing note reviewed.  Constitutional:      General: She is not in acute distress.    Appearance: She is well-developed. She is not ill-appearing or diaphoretic.  HENT:     Head: Normocephalic and atraumatic.     Right Ear: External ear normal.     Left Ear: External ear normal.     Nose: Nose normal.  Eyes:     General:        Right eye: No discharge.        Left eye: No discharge.  Cardiovascular:     Rate and Rhythm: Normal rate and regular rhythm.     Heart sounds: Normal heart sounds.  Pulmonary:     Effort: Pulmonary effort is normal.     Breath sounds: Normal breath sounds.  Abdominal:     Palpations: Abdomen is soft.     Tenderness: There is no abdominal tenderness.  Skin:    General: Skin  is warm and dry.  Neurological:     Mental Status: She is alert.  Psychiatric:        Mood and Affect: Mood is not anxious.      ED Treatments / Results  Labs (all labs ordered are listed, but only  abnormal results are displayed) Labs Reviewed - No data to display  EKG None  Radiology No results found.  Procedures Procedures (including critical care time)  Medications Ordered in ED Medications  acetaminophen (TYLENOL) tablet 1,000 mg (has no administration in time range)  vitamin C (ASCORBIC ACID) tablet 250 mg (has no administration in time range)  latanoprost (XALATAN) 0.005 % ophthalmic solution 1 drop (0 drops Both Eyes Hold 10/11/18 2207)  Olmesartan-amLODIPine-HCTZ 40-5-25 MG TABS 1 tablet (has no administration in time range)  cycloSPORINE (RESTASIS) 0.05 % ophthalmic emulsion 1 drop (0 drops Both Eyes Hold 10/11/18 2207)  apixaban (ELIQUIS) tablet 5 mg (has no administration in time range)     Initial Impression / Assessment and Plan / ED Course  I have reviewed the triage vital signs and the nursing notes.  Pertinent labs & imaging results that were available during my care of the patient were reviewed by me and considered in my medical decision making (see chart for details).        Patient has no medical complaints.  Besides some mild bradycardia her vital signs are okay.  I do not think labs are indicated.  I will start her on her home meds and she will need social work consultation.  Final Clinical Impressions(s) / ED Diagnoses   Final diagnoses:  None    ED Discharge Orders    None       Sherwood Gambler, MD 10/11/18 2244

## 2018-10-11 NOTE — Discharge Instructions (Signed)
Information on my medicine - ELIQUIS (apixaban)  Why was Eliquis prescribed for you? Eliquis was prescribed to treat blood clots that may have been found in the veins of your legs (deep vein thrombosis) or in your lungs (pulmonary embolism) and to reduce the risk of them occurring again.  What do You need to know about Eliquis ? The  dose  ONE 5 mg tablet taken TWICE daily.  Eliquis may be taken with or without food.   Try to take the dose about the same time in the morning and in the evening. If you have difficulty swallowing the tablet whole please discuss with your pharmacist how to take the medication safely.  Take Eliquis exactly as prescribed and DO NOT stop taking Eliquis without talking to the doctor who prescribed the medication.  Stopping may increase your risk of developing a new blood clot.  Refill your prescription before you run out.  After discharge, you should have regular check-up appointments with your healthcare provider that is prescribing your Eliquis.    What do you do if you miss a dose? If a dose of ELIQUIS is not taken at the scheduled time, take it as soon as possible on the same day and twice-daily administration should be resumed. The dose should not be doubled to make up for a missed dose.  Important Safety Information A possible side effect of Eliquis is bleeding. You should call your healthcare provider right away if you experience any of the following: ? Bleeding from an injury or your nose that does not stop. ? Unusual colored urine (red or dark brown) or unusual colored stools (red or black). ? Unusual bruising for unknown reasons. ? A serious fall or if you hit your head (even if there is no bleeding).  Some medicines may interact with Eliquis and might increase your risk of bleeding or clotting while on Eliquis. To help avoid this, consult your healthcare provider or pharmacist prior to using any new prescription or non-prescription medications,  including herbals, vitamins, non-steroidal anti-inflammatory drugs (NSAIDs) and supplements.  This website has more information on Eliquis (apixaban): http://www.eliquis.com/eliquis/home   

## 2018-10-11 NOTE — Progress Notes (Addendum)
ANTICOAGULATION CONSULT NOTE - Initial Consult  Pharmacy Consult for apixaban Indication: PE  Allergies  Allergen Reactions  . Milk-Related Compounds Other (See Comments)    Frequent bowel movements   . Augmentin [Amoxicillin-Pot Clavulanate] Diarrhea     Vital Signs: Temp: 98.2 F (36.8 C) (08/02 1924) Temp Source: Oral (08/02 1924) BP: 113/62 (08/02 1924) Pulse Rate: 76 (08/02 1924)  Labs: No results for input(s): HGB, HCT, PLT, APTT, LABPROT, INR, HEPARINUNFRC, HEPRLOWMOCWT, CREATININE, CKTOTAL, CKMB, TROPONINIHS in the last 72 hours.  Estimated Creatinine Clearance: 23.8 mL/min (A) (by C-G formula based on SCr of 1.08 mg/dL (H)).   Medical History: Past Medical History:  Diagnosis Date  . Anxiety   . Arthritis    celebrex use off & on for arthritis- hands, back   . Back pain   . Bifascicular block   . Breast cancer ([place])    a. DCIS s/p R lumpectomy  . Dementia ([place])   . Hyperlipidemia   . Hypertension   . Palpitations    a. holter (7/11):  isolated PVCs and PACs  . Rheumatism   . Valvular heart disease    a. Echo (7/11):  EF 55-60%, Gr 1 DD, mild-mod AI, mild-mod MR, mod PI, PASP 30;  b. Echo (09/2010):  Mild LVH, EF 60%, Gr 1 DD, mild to mod AI, mild MR, mild TR, mild to mod PI, PASP 36.;   c.  Echo (05/2013):  EF 60-65%, no RWMA, mild AI, mild to mod PI       Assessment: 85 yo female with recent PE (09/27/18). She is on xarelto 10mg  po daily PTA. It appears she was placed on the wrong dose when it was diagnosed. She has already taken 10mg  of xarelto today (around 6pm).    Pharmacy was consulte d to dose apixaban for PE.  Since the PE was in July will not use a load.  -Wt is 46.6kg  Goal of Therapy:  Monitor platelets by anticoagulation protocol: Yes   Plan:  -apixaban 5mg  po bid (start on 10/12/18) -Educated family  Hildred Laser, PharmD Clinical Pharmacist **Pharmacist phone directory can now be found on [email or web address] ([email or web address]).  Listed under [place].

## 2018-10-12 NOTE — ED Notes (Signed)
Pt speaking with daughter now.

## 2018-10-12 NOTE — ED Notes (Addendum)
Went into check on patient and she was standing at the door. Pt was helped back into bed. Pt's bed in lowest position and both side rails up. Instructed pt to please only get out of bed with this RN or EMT present. Pt has call bell and will check in frequently.   Waiting for OT eval

## 2018-10-12 NOTE — Evaluation (Signed)
Physical Therapy Evaluation Patient Details Name: Brianna Hunter MRN: [id number] DOB: 1928/10/13 Today's Date: 10/12/2018   History of Present Illness  Pt is an 85 y/o female presenting to the ED secondary to poor living conditions and inability to care for herself. PMH includes a fib, HTN, and dementia.   Clinical Impression  Pt presenting with problem above with deficits below. Pt unsteady during gait and requiring min A for steadying throughout. Pt with 2 LOB as well. Pt also with cognitive deficits at baseline. Per notes, pt's daughter unable to care for pt at home as she works during the day. Feel pt will require SNF level therapy at d/c to increase independence and safety prior to return home. Will continue to follow acutely to maximize functional mobility independence and safety.     Follow Up Recommendations SNF;Supervision/Assistance - 24 hour    Equipment Recommendations  None recommended by PT    Recommendations for Other Services       Precautions / Restrictions Precautions Precautions: Fall Restrictions Weight Bearing Restrictions: No      Mobility  Bed Mobility Overal bed mobility: Needs Assistance Bed Mobility: Supine to Sit;Sit to Supine     Supine to sit: Min assist Sit to supine: Min assist   General bed mobility comments: Min A for trunk elevation. Increased time to come to EOB. Required assist for LE assist for return to supine.   Transfers Overall transfer level: Needs assistance Equipment used: 1 person hand held assist Transfers: Sit to/from Stand Sit to Stand: Min assist         General transfer comment: Min A for lift assist and steadying assist. Pt initially with posterior lean, however, able to correct with assist.   Ambulation/Gait Ambulation/Gait assistance: Min assist Gait Distance (Feet): 75 Feet Assistive device: 1 person hand held assist Gait Pattern/deviations: Step-through pattern;Decreased stride length Gait velocity:  Decreased   General Gait Details: Slow, unsteady gait. 2LOB noted throughout. Pt requiring min A for steadying. Very narrow BOS.   Stairs            Wheelchair Mobility    Modified Rankin (Stroke Patients Only)       Balance Overall balance assessment: Needs assistance Sitting-balance support: No upper extremity supported;Feet supported Sitting balance-Leahy Scale: Fair     Standing balance support: Single extremity supported;During functional activity Standing balance-Leahy Scale: Poor Standing balance comment: Reliant on UE and external support                             Pertinent Vitals/Pain Pain Assessment: Faces Faces Pain Scale: No hurt    Home Living Family/patient expects to be discharged to:: Private residence Living Arrangements: Other relatives(daughter) Available Help at Discharge: Family;Available PRN/intermittently Type of Home: Apartment Home Access: Level entry     Home Layout: One level Home Equipment: Cane - single point;Walker - 2 wheels      Prior Function Level of Independence: Needs assistance   Gait / Transfers Assistance Needed: reports using RW for mobility   ADL's / Homemaking Assistance Needed: Reports daughter has to assist with bathing/dressing.         Hand Dominance        Extremity/Trunk Assessment   Upper Extremity Assessment Upper Extremity Assessment: Generalized weakness    Lower Extremity Assessment Lower Extremity Assessment: Generalized weakness    Cervical / Trunk Assessment Cervical / Trunk Assessment: Kyphotic  Communication   Communication: HOH  Cognition Arousal/Alertness:  Awake/alert Behavior During Therapy: WFL for tasks assessed/performed Overall Cognitive Status: History of cognitive impairments - at baseline                                        General Comments      Exercises     Assessment/Plan    PT Assessment Patient needs continued PT services  PT  Problem List Decreased strength;Decreased activity tolerance;Decreased balance;Decreased mobility;Decreased knowledge of use of DME;Cardiopulmonary status limiting activity;Decreased cognition;Decreased safety awareness;Decreased knowledge of precautions       PT Treatment Interventions Gait training;Functional mobility training;Therapeutic activities;DME instruction;Balance training;Patient/family education;Stair training    PT Goals (Current goals can be found in the Care Plan section)  Acute Rehab PT Goals Patient Stated Goal: "to do some exercise"  PT Goal Formulation: With patient Time For Goal Achievement: 10/26/18 Potential to Achieve Goals: Good    Frequency Min 2X/week   Barriers to discharge Decreased caregiver support Per notes, pt's daughter unable to assist with caregiving support    Co-evaluation               AM-PAC PT "6 Clicks" Mobility  Outcome Measure Help needed turning from your back to your side while in a flat bed without using bedrails?: A Little Help needed moving from lying on your back to sitting on the side of a flat bed without using bedrails?: A Little Help needed moving to and from a bed to a chair (including a wheelchair)?: A Little Help needed standing up from a chair using your arms (e.g., wheelchair or bedside chair)?: A Little Help needed to walk in hospital room?: A Little Help needed climbing 3-5 steps with a railing? : A Lot 6 Click Score: 17    End of Session Equipment Utilized During Treatment: Gait belt Activity Tolerance: Patient limited by fatigue Patient left: in bed;with call bell/phone within reach(no bed alarm on stretcher in ED) Nurse Communication: Mobility status PT Visit Diagnosis: Other abnormalities of gait and mobility (R26.89);Muscle weakness (generalized) (M62.81)    Time: 9924-2683 PT Time Calculation (min) (ACUTE ONLY): 16 min   Charges:   PT Evaluation $PT Eval Moderate Complexity: [place], PT, DPT  Acute Rehabilitation Services  Pager: [phone] Office: [phone]   Rudean Hitt 10/12/2018, 11:43 AM

## 2018-10-12 NOTE — ED Notes (Addendum)
Patient found out of bed, sitting on a chair without any clothes on. Pt had voided in the bed, in the chair, and on the floor in front of the door. Bed was cleaned and new linen put on bed as well as a new gown was placed on the patient. Pt is now back in bed with call bell in her lap. This tech reiterated how to call for help by pushing the big red button and not to get up unless there was an Therapist, sports or Tech there to help. Room has been cleaned as well. RN notified.

## 2018-10-12 NOTE — ED Notes (Signed)
Pt eating breakfast at this time.  

## 2018-10-12 NOTE — Progress Notes (Signed)
CSW left HIPPA compliant voicemail with patient's daughter requesting a call back to update family on patient's progress towards SNF placement.  Lamonte Richer, LCSW, [place] Worker II [phone]

## 2018-10-12 NOTE — ED Notes (Signed)
Spoke with Lamonte Richer, LCSW who advised this RN that they are waiting to speak with [facility] tomorrow regarding placement and that he has spoke with patient's daughter regarding this. Joey also advised he felt confident patient could be placed with [facility] tomorrow barring any issues with insurance coverage.

## 2018-10-12 NOTE — ED Notes (Signed)
Breakfast Ordered 

## 2018-10-12 NOTE — Progress Notes (Addendum)
1pm: CSW completed patient's FL2 and faxed her out to local facilities. CSW notified patient's daughter May of PT recommendation.   7:30am: CSW received consult for patient to assist with possible placement. CSW reviewed nursing notes, it appears that patient's home living conditions are poor and her daughter is requesting placement due to the inability to care for her alone. CSW spoke with Vicente Males, RN regarding patient, CSW requested that RN consult PT/OT for an evaluation.  CSW spoke with patient's daughter May at [phone] to obtain information. May reports that the patient was living independently at [facility] which is a subsidized independent apartment complex on [address] up until May of this year. May reports that the patient had a brief stay at [facility] in [place] in May, which the patient benefited from. Once the patient left [facility], she went to live with her daughter. May states that it is her own home that is poor condition, lacking air condition, heat, and working plumbing. May states that the patient also has a son but that he is not involved in her care. CSW and May discussed the levels of care including home health, assisted living, and skilled nursing. 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  May reports that she made an APS report yesterday on herself and her mother to request assistance with resources to best care for her mother. May states that the report was accepted and will be assigned to a APS social worker today.   CSW to monitor chart and assist with patient needs.  Madilyn Fireman, MSW, LCSW-A Clinical Social Worker [facility] [phone]

## 2018-10-12 NOTE — ED Notes (Signed)
PT has arrived

## 2018-10-12 NOTE — NC FL2 (Signed)
[place] MEDICAID FL2 LEVEL OF CARE SCREENING TOOL     IDENTIFICATION  Patient Name: Brianna Hunter Birthdate: Aug 03, 1928 Sex: female Admission Date (Current Location): 10/11/2018  [facility] and [place] Number:  Herbalist and Address:  [facility] [place], [place] [address], [place], [place] [address]      Provider Number: [id number]  Attending Physician Name and Address:  Default, Provider, MD  Relative Name and Phone Number:  May Corrie, [phone]    Current Level of Care: Hospital Recommended Level of Care: [place] Prior Approval Number:    Date Approved/Denied:   PASRR Number: [id number]  Discharge Plan: SNF    Current Diagnoses: Patient Active Problem List   Diagnosis Date Noted  . Pulmonary embolism ([place]) 09/27/2018  . Weakness generalized   . Palliative care by specialist   . DNR (do not resuscitate) discussion   . Protein-calorie malnutrition, severe 07/16/2018  . Pressure injury of skin 07/16/2018  . Acute kidney injury (AKI) with acute tubular necrosis (ATN) ([place]) 07/14/2018  . Fall at home, initial encounter 07/13/2018  . Lactic acidosis 07/13/2018  . AKI (acute kidney injury) ([place]) 07/13/2018  . High anion gap metabolic acidosis 58/52/7782  . Elevated liver enzymes 07/13/2018  . Thrombocytopenia ([place]) 07/13/2018  . Elevated troponin 07/13/2018  . Atrial fibrillation with RVR ([place]) 07/13/2018  . Microcytic anemia 10/15/2013  . Poor dentition 08/09/2013  . Acute respiratory failure with hypoxia ([place]) 07/12/2013  . CAP (community acquired pneumonia) 07/11/2013  . Sepsis ([place]) 07/09/2013  . Cavitary lesion of lung 07/09/2013  . Leukocytosis 07/09/2013  . Protein-calorie malnutrition, moderate ([place]) 07/09/2013  . Hemoptysis 07/09/2013  . 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 ([place]) 10/07/2011  . Valvular heart disease 08/22/2010  . HYPERLIPIDEMIA-MIXED 09/06/2009  . AORTIC &  MITRAL STENOSIS W/ INSUFFI, RHEUM/NON-RHEUM 09/06/2009  . HYPERTENSION, BENIGN 09/06/2009  . AORTIC STENOSIS/ INSUFFICIENCY, NON-RHEUMATIC 09/06/2009  . PALPITATIONS 09/06/2009  . ABDOMINAL MASS 09/06/2009    Orientation RESPIRATION BLADDER Height & Weight     Self, Place  Normal Continent Weight: 102 lb 11.8 oz (46.6 kg) Height:  4\' 9"  (144.8 cm)  BEHAVIORAL SYMPTOMS/MOOD NEUROLOGICAL BOWEL NUTRITION STATUS      Continent Diet  AMBULATORY STATUS COMMUNICATION OF NEEDS Skin   Total Care Verbally Normal                       Personal Care Assistance Level of Assistance  Total care, Feeding, Dressing, Bathing Bathing Assistance: Limited assistance Feeding assistance: Limited assistance Dressing Assistance: Limited assistance Total Care Assistance: Maximum assistance   Functional Limitations Info  Speech, Sight, Hearing Sight Info: Impaired(Left eye blindness) Hearing Info: Adequate Speech Info: Adequate    SPECIAL CARE FACTORS FREQUENCY  OT (By licensed OT), PT (By licensed PT)     PT Frequency: 5x weekly OT Frequency: 5x weekly            Contractures Contractures Info: Not present    Additional Factors Info                  Current Medications (10/12/2018):  This is the current hospital active medication list Current Facility-Administered Medications  Medication Dose Route Frequency Provider Last Rate Last Dose  . acetaminophen (TYLENOL) tablet 1,000 mg  1,000 mg Oral Q6H PRN Sherwood Gambler, MD      . irbesartan (AVAPRO) tablet 300 mg  300 mg Oral Daily Sherwood Gambler, MD   300  mg at 10/12/18 1008   And  . amLODipine (NORVASC) tablet 5 mg  5 mg Oral Daily Sherwood Gambler, MD   5 mg at 10/12/18 1008   And  . hydrochlorothiazide (HYDRODIURIL) tablet 25 mg  25 mg Oral Daily Sherwood Gambler, MD   25 mg at 10/12/18 1008  . apixaban (ELIQUIS) tablet 5 mg  5 mg Oral BID Kris Mouton, RPH   5 mg at 10/12/18 1008  . cycloSPORINE (RESTASIS) 0.05 %  ophthalmic emulsion 1 drop  1 drop Both Eyes Q12H Sherwood Gambler, MD   1 drop at 10/12/18 1009  . latanoprost (XALATAN) 0.005 % ophthalmic solution 1 drop  1 drop Both Eyes QHS Sherwood Gambler, MD   Stopped at 10/11/18 2207  . vitamin C (ASCORBIC ACID) tablet 250 mg  250 mg Oral Daily Sherwood Gambler, MD       Current Outpatient Medications  Medication Sig Dispense Refill  . XARELTO 10 MG TABS tablet Take 10 mg by mouth daily.    Marland Kitchen acetaminophen (TYLENOL) 500 MG tablet Take 1,000 mg by mouth every 6 (six) hours as needed for mild pain.    . Ascorbic Acid (VITAMIN C PO) Take 1 tablet by mouth daily.    . bimatoprost (LUMIGAN) 0.01 % SOLN Place 1 drop into both eyes at bedtime.    . citalopram (CELEXA) 20 MG tablet Take 20 mg by mouth daily.    . cycloSPORINE (RESTASIS) 0.05 % ophthalmic emulsion Place 1 drop into both eyes every 12 (twelve) hours.    . Eliquis DVT/PE Starter Pack (ELIQUIS STARTER PACK) 5 MG TABS Take as directed on package: start with two-5mg  tablets twice daily for 7 days. On day 8, switch to one-5mg  tablet twice daily. 1 each 0  . Olmesartan-amLODIPine-HCTZ 40-5-25 MG TABS Take 1 tablet by mouth daily.       Discharge Medications: Please see discharge summary for a list of discharge medications.  Relevant Imaging Results:  Relevant Lab Results:   Additional Information SSN: [id number]  Archie Endo, LCSW

## 2018-10-13 LAB — SARS CORONAVIRUS 2 BY RT PCR (HOSPITAL ORDER, PERFORMED IN ~~LOC~~ HOSPITAL LAB): SARS Coronavirus 2: NEGATIVE

## 2018-10-13 NOTE — ED Notes (Addendum)
Pt arrived to Rm 48 via stretcher. Pt transferred to bed for comfort. Pure Wick intact. Pt asked to watch tv - turned on for pt. Pt aware waiting on SNF placement.

## 2018-10-13 NOTE — ED Notes (Signed)
Placed pt on purewick  

## 2018-10-13 NOTE — Progress Notes (Addendum)
CSW spoke with patient's daughter May to inform her that the patient was accepted at [facility] and [facility]. May desires her mother to return to [facility] as she had a good experience there in the past. CSW will coordinate discharge to the facility.  CSW confirmed with [name] of [facility] to initiate insurance authorization for this patient.  CSW spoke with Elmyra Ricks, RN to have rapid COVID ordered for this patient to not delay discharge, RN agreeable.  Madilyn Fireman, MSW, LCSW-A Clinical Social Worker Transitions of [place] [facility] [phone]

## 2018-10-14 NOTE — Progress Notes (Signed)
Pt needs peer to peer consultation to complete insurance authorization. Number to call is [place]: [phone] ext [phone]. This needs to be completed by 4pm on 10/15/2018. 1st CSW to alert EDP of this.

## 2018-10-14 NOTE — ED Notes (Signed)
Patient was given Catheryn Bacon and a Cup of water.

## 2018-10-14 NOTE — ED Notes (Signed)
Pt on phone w daughter

## 2018-10-14 NOTE — ED Notes (Signed)
PT w/pt.  

## 2018-10-14 NOTE — Evaluation (Signed)
Occupational Therapy Evaluation Patient Details Name: Brianna Hunter MRN: [id number] DOB: 17-Oct-1928 Today's Date: 10/14/2018    History of Present Illness Pt is an 85 y/o female presenting to the ED secondary to poor living conditions and inability to care for herself. PMH includes a fib, HTN, and dementia.    Clinical Impression   Pt walks with a RW and is assisted for ADL and IADL at baseline. She presents with impaired cognition and decreased standing balance. She requires min assist for ADL and min guard assist for OOB mobility. Recommending SNF upon discharge as pt does not have 24 hour care at home.     Follow Up Recommendations  SNF;Supervision/Assistance - 24 hour    Equipment Recommendations       Recommendations for Other Services       Precautions / Restrictions Precautions Precautions: Fall Restrictions Weight Bearing Restrictions: No      Mobility Bed Mobility Overal bed mobility: Modified Independent             General bed mobility comments: no assist, increased time, inefficient  Transfers Overall transfer level: Needs assistance Equipment used: Rolling walker (2 wheeled) Transfers: Sit to/from Stand Sit to Stand: Min guard         General transfer comment: min guard for safety, pt with tendency to "park" her walker and walk without it short distances    Balance Overall balance assessment: Needs assistance Sitting-balance support: No upper extremity supported;Feet supported Sitting balance-Leahy Scale: Fair       Standing balance-Leahy Scale: Fair Standing balance comment: statically at sink, reliant on walker for ambulation                           ADL either performed or assessed with clinical judgement   ADL Overall ADL's : Needs assistance/impaired Eating/Feeding: Independent;Bed level   Grooming: Wash/dry hands;Standing;Supervision/safety   Upper Body Bathing: Minimal assistance;Sitting   Lower Body Bathing:  Minimal assistance;Sit to/from stand   Upper Body Dressing : Minimal assistance;Sitting   Lower Body Dressing: Minimal assistance;Sit to/from stand   Toilet Transfer: Ambulation;RW;Min guard   Toileting- Clothing Manipulation and Hygiene: Minimal assistance;Sit to/from stand       Functional mobility during ADLs: Rolling walker;Min guard       Vision Baseline Vision/History: Wears glasses Patient Visual Report: No change from baseline Additional Comments: cloudy lens on L     Perception     Praxis      Pertinent Vitals/Pain Pain Assessment: No/denies pain     Hand Dominance Right   Extremity/Trunk Assessment Upper Extremity Assessment Upper Extremity Assessment: Overall WFL for tasks assessed(arthritic changes in hands)   Lower Extremity Assessment Lower Extremity Assessment: Defer to PT evaluation   Cervical / Trunk Assessment Cervical / Trunk Assessment: Kyphotic   Communication Communication Communication: HOH   Cognition Arousal/Alertness: Awake/alert Behavior During Therapy: WFL for tasks assessed/performed Overall Cognitive Status: History of cognitive impairments - at baseline                                     General Comments       Exercises     Shoulder Instructions      Home Living Family/patient expects to be discharged to:: Private residence Living Arrangements: Children Available Help at Discharge: Family;Available PRN/intermittently Type of Home: Apartment Home Access: Level entry     Home Layout:  One level     Bathroom Shower/Tub: Teacher, early years/pre: Standard     Home Equipment: Cane - single point;Walker - 2 wheels          Prior Functioning/Environment Level of Independence: Needs assistance  Gait / Transfers Assistance Needed: reports using RW for mobility  ADL's / Homemaking Assistance Needed: Reports daughter has to assist with bathing/dressing.             OT Problem List:  Impaired balance (sitting and/or standing);Decreased knowledge of use of DME or AE;Decreased cognition;Decreased safety awareness      OT Treatment/Interventions: Self-care/ADL training;DME and/or AE instruction;Patient/family education;Balance training    OT Goals(Current goals can be found in the care plan section) Acute Rehab OT Goals Patient Stated Goal: "to do some exercise"   OT Frequency: Min 2X/week   Barriers to D/C: Decreased caregiver support          Co-evaluation              AM-PAC OT "6 Clicks" Daily Activity     Outcome Measure Help from another person eating meals?: None Help from another person taking care of personal grooming?: A Little Help from another person toileting, which includes using toliet, bedpan, or urinal?: A Little Help from another person bathing (including washing, rinsing, drying)?: A Little Help from another person to put on and taking off regular upper body clothing?: A Little Help from another person to put on and taking off regular lower body clothing?: A Little 6 Click Score: 19   End of Session Equipment Utilized During Treatment: Gait belt;Rolling walker Nurse Communication: Mobility status  Activity Tolerance: Patient tolerated treatment well Patient left: in bed;with call bell/phone within reach;with nursing/sitter in room  OT Visit Diagnosis: Unsteadiness on feet (R26.81);Other abnormalities of gait and mobility (R26.89);Other symptoms and signs involving cognitive function;Muscle weakness (generalized) (M62.81)                Time: 4827-0786 OT Time Calculation (min): 23 min Charges:  OT General Charges $OT Visit: 1 Visit OT Evaluation $OT Eval Moderate Complexity: 1 Mod OT Treatments $Self Care/Home Management : 8-22 mins  Nestor Lewandowsky, OTR/L Acute Rehabilitation Services Pager: [phone] Office: [phone]  Malka So 10/14/2018, 9:04 AM

## 2018-10-14 NOTE — ED Notes (Signed)
Diet was ordered for Lunch. 

## 2018-10-14 NOTE — ED Notes (Signed)
Daughter updated and she spoke with patient

## 2018-10-14 NOTE — Progress Notes (Signed)
CSW spoke with [name] at [facility] to check on patient's insurance authorization status. [name] states that additional notes were provided to [facility] and that authorization is still pending.  CSW updated patient's RN Jacqlyn Larsen.  Madilyn Fireman, MSW, LCSW-A Clinical Social Worker Transitions of [place] [facility] [phone]

## 2018-10-15 NOTE — Progress Notes (Signed)
Physical Therapy Treatment Patient Details Name: Brianna Hunter MRN: [id number] DOB: 1928-09-05 Today's Date: 10/15/2018    History of Present Illness Pt is an 85 y/o female presenting to the ED secondary to poor living conditions and inability to care for herself. PMH includes a fib, HTN, and dementia.     PT Comments    Pt progressing towards goals. Remains unsteady during ambulation and required min to mod A for steadying without use of AD. Reviewed supine LE HEP with pt. Current recommendations appropriate. Will continue to follow acutely to maximize functional mobility independence and safety.     Follow Up Recommendations  SNF;Supervision/Assistance - 24 hour     Equipment Recommendations  None recommended by PT    Recommendations for Other Services       Precautions / Restrictions Precautions Precautions: Fall Restrictions Weight Bearing Restrictions: No    Mobility  Bed Mobility Overal bed mobility: Needs Assistance Bed Mobility: Supine to Sit;Sit to Supine     Supine to sit: Supervision Sit to supine: Supervision   General bed mobility comments: Supervision for safety.   Transfers Overall transfer level: Needs assistance Equipment used: 1 person hand held assist Transfers: Sit to/from Stand Sit to Stand: Min assist         General transfer comment: Min A to stand with no AD. Pt completing multiple stands for clean up and changing brief.   Ambulation/Gait Ambulation/Gait assistance: Min assist;Mod assist Gait Distance (Feet): 20 Feet Assistive device: 1 person hand held assist Gait Pattern/deviations: Step-through pattern;Decreased stride length;Narrow base of support Gait velocity: Decreased   General Gait Details: Slow, unsteady gait without AD. Pt with very narrow BOS during ambulation which caused increased unsteadiness. Min to mod A for steadying without AD.    Stairs             Wheelchair Mobility    Modified Rankin (Stroke  Patients Only)       Balance Overall balance assessment: Needs assistance Sitting-balance support: No upper extremity supported;Feet supported Sitting balance-Leahy Scale: Fair     Standing balance support: Single extremity supported;During functional activity Standing balance-Leahy Scale: Poor Standing balance comment: Reliant on at least 1 UE support                             Cognition Arousal/Alertness: Awake/alert Behavior During Therapy: WFL for tasks assessed/performed Overall Cognitive Status: History of cognitive impairments - at baseline                                        Exercises General Exercises - Lower Extremity Ankle Circles/Pumps: AROM;Both;10 reps;Supine Heel Slides: AROM;Both;10 reps;Supine Straight Leg Raises: AROM;Both;10 reps;Supine    General Comments        Pertinent Vitals/Pain Pain Assessment: No/denies pain    Home Living                      Prior Function            PT Goals (current goals can now be found in the care plan section) Acute Rehab PT Goals Patient Stated Goal: to go for a walk  PT Goal Formulation: With patient Time For Goal Achievement: 10/26/18 Potential to Achieve Goals: Good Progress towards PT goals: Progressing toward goals    Frequency    Min 2X/week  PT Plan Current plan remains appropriate    Co-evaluation              AM-PAC PT "6 Clicks" Mobility   Outcome Measure  Help needed turning from your back to your side while in a flat bed without using bedrails?: None Help needed moving from lying on your back to sitting on the side of a flat bed without using bedrails?: A Little Help needed moving to and from a bed to a chair (including a wheelchair)?: A Little Help needed standing up from a chair using your arms (e.g., wheelchair or bedside chair)?: A Little Help needed to walk in hospital room?: A Lot Help needed climbing 3-5 steps with a railing? :  A Lot 6 Click Score: 17    End of Session   Activity Tolerance: Patient tolerated treatment well Patient left: in bed;with call bell/phone within reach Nurse Communication: Mobility status PT Visit Diagnosis: Other abnormalities of gait and mobility (R26.89);Muscle weakness (generalized) (M62.81)     Time: 4825-0037 PT Time Calculation (min) (ACUTE ONLY): 14 min  Charges:  $Gait Training: 8-22 mins                     Leighton Ruff, PT, DPT  Acute Rehabilitation Services  Pager: [phone] Office: [phone]    Rudean Hitt 10/15/2018, 11:03 AM

## 2018-10-15 NOTE — ED Notes (Signed)
Patient was given Catheryn Bacon, and Juice. Regular Diet was ordered for lunch.

## 2018-10-15 NOTE — ED Notes (Signed)
Pt changed and reposistoned with assistance of NT staff. Pt's pressure ulcer dressing from previous RN remains intact declines pain in sacrum

## 2018-10-15 NOTE — ED Provider Notes (Signed)
Discussed with insurance as well as patient's PCP, Dr. Criss Rosales.  Dr. Criss Rosales is happy to discuss peer to peer consult regarding recommendation for SNF placement.  Gave her information to insurance company to call her to schedule peer to peer consult.    Gareth Morgan, MD 10/15/18 1105

## 2018-10-15 NOTE — ED Notes (Signed)
Dinner Tray ordered 

## 2018-10-15 NOTE — Progress Notes (Addendum)
2:15p CSW waiting on call back from [place] with [facility] Utilization to complete peer to peer process. May need to have RN contact PCP. RN extension is [phone] ([place]: [phone]).  1:50p CSW followed up on this and Dr. Criss Rosales was to complete it. CSW then received another call that there was information needed that Dr. Criss Rosales did not have. CSW to assist in giving this information and then have Dr. Criss Rosales complete the provider portion.   CSW also called Cleon Dew with [facility] to update him about this.   10:34a CSW contacted EDP Dr. Billy Fischer about completing peer to peer consultation with patient's insurance so patient can get authorized for SNF. Of note, Dr. Billy Fischer spoke with patient's PCP Dr. Criss Rosales to complete this as PCP has more background of patient's medical history.    Golden Circle, LCSW Transitions of Care Department [facility] ED [phone]

## 2018-10-16 NOTE — ED Notes (Signed)
Regular Diet was ordered for Lunch. Patient was given Darliss Cheney.

## 2018-10-16 NOTE — Progress Notes (Addendum)
2:50p CSW contacted Cleon Dew with [facility] to let him know the peer to peer should be completed and that he can follow up with [facility] on the authorization.   [name] called CSW back and CSW was informed that the authorization was denied. [name] recommended patient's family get patient's full Medicaid reestablished so patient can get long-term care. [name] reports if this process is done quickly he may be able to take patient with Medicaid pending.   12:50p CSW received confirmation that patient's PCP was completing the peer to peer consult with [facility].  8:32a CSW spoke with RN Fredna Dow with [employer] Utilization to follow up on completing the peer to peer process for patient. Marline provided CSW with number for peer to peer consult [phone] ext. [phone]). CSW passed this along to patient's PCP office. CSW will continue to follow up.   Golden Circle, LCSW Transitions of Care Department [facility] ED [phone]

## 2018-10-16 NOTE — Progress Notes (Addendum)
EDP updated that APS report was filed and is to be either screened in or screened out on Monday when the office opens.   EDP stated that D/C'ing pt home with daughter's claims of (although unsubstantiated at this time)  unsafe and unsanitary conditions in the home and with pt's dementia and pt's daughter's claim that the pt is unsafe due to the daughter's own "stress and crises).  Of note: ASPS will not prioritize pt's case for investigation unless pt is D/C'd to unsafe living conditions and conversely, pt is very unlikely to be assessed by APS in a timely manner if pt is in the ED as APS has been known in the past to consider the hospital to be a safe place.  EDP/CSW concluded that perhaps contacting [employer] to insist that an appropriate peer-to-peer appeal should be conducted due pt to the denied authorized SNF days, be conducted between the Emergency Dept Provider and [facility], rather than the pt's PCP and [employer], in order to seek a second chance with [employer] at assisting pt with getting SNF placement.  Pt's daughter is updated and pt's daughter states she will also call APS on-call worker [place] and file a report on her on to seek assistance with the pt.  PLAN:  1. Call [facility] on Monday when they open and advocate for a 2nd peer-to-peer with the Emergency Dept Provider (Dr. Marcie Bal; Senior Ed Provider I possible as she is familiar with the case), rather than the pt's PCP, as the pt is in the ED.  2. Follow up with [place] on Monday asking that they assist [facility] ED with asking the daughter to take pt back and/or assist the pt with "interim guardianship"  3. Ask DSS to visit the home and verify that pt really does not have plumbing and air conditioning as the pt's daughter states.  4. Call the [facility] CSW/CM Advanced Care Supervisor on Saturday 8/8 and update and ask for guidance.  CSW will continue to follow for D/C needs.  Brianna Guild. Kaylean Tupou, LCSW, LCAS, CSI Transitions of Care  Clinical Social Worker Care Coordination Department Ph: [phone]

## 2018-10-16 NOTE — Progress Notes (Addendum)
2nd shift ED CSW received a handoff from the 1st shift [facility] ED CSW.   Per the 1st shift ED CSW pt's request for insurance authorization for a SNF stay was denied following a peer-to-peer review.  Per 1st shift ED CSW there are no options for placement with pt's current insurance [facility]).  CSW updated EPD at [place] will inform daughter.   RN is at: [id number]  5:39 PM CSW called pt's daughter May Neece at ph: [phone] and # was not in operation.  CSW called pt's daughter's other number at [phone] and informed her pt can not be placed.  Pt's daughter stated that she cannot care for the pt.  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  "I have forewarned, I don't know what I will do".    Pt's daughter who lives and works in the home states that the home has no air conditioning, has mold and has no plumbing or heating but this has not been substantiated.  Per pt's daughter, pt has too much money for public assistance [facility] ) and has too little for private paid care.  Pt's daughter by phone presented with pressured speech and spoke in a frenetic, emotional and tearful manner stating she herself is in a mental health crisis and repeatedly stated, "Ya'll just going to have to do what ya'll have to do, I'll go to jail and if she comes back here there is no telling what I'll do" and continued about how she "can't guarantee her (the pt's) safety if she comes back here" referring to her mother's safety.  Pt's daughter stated she would be at home working until 8pm and then will call APS to let them know, at CSW's request, that pt will be coming home at D/C and that they (APS) will need to assist her and the pt with a safe  plan for D/C and/or taking guardianship for the pt.  Pt's daughter sells mortgage financing at home from 8am-8pm, per the notes and per the pt's daughter.  CSW attempted to impress upon the pt's daughter that APS is not likely to assist the pt if the pt is not D/C'd home and deemed unsafe to be at home post-D/C but pt was continually insisting she will not be able to care for the pt at home.  CSW counseled pt's daughter on SNF's, ALF's, how they work, who is qualified to go there, insurances that do and don't pay for them and how it is paid for otherwise.  CSW counseled pt's daughter on contacting APS and how to file a report and CSW provided pt's daughter with the after-hours APS hotline.  Pt's daughter stated she has already done this but CSW encouraged pt's daughter to do this again as pt's daughter states she has begun the guardianship process but has not finished it and due to her mental health crisis she cannot take responsibility for continuing it.  Pt's daughter states she will call APS but cannot be responsible for helping her any further.  Pt's daughter was appreciative and thanked the CSW.  Please reconsult if future social work needs arise.  CSW signing off, as social work intervention is no  longer needed.  Alphonse Guild. Mackinley Kiehn, LCSW, LCAS, CSI Clinical Social Worker Ph: [phone]

## 2018-10-16 NOTE — ED Provider Notes (Signed)
Patient evaluated on daily rounds.  Patient is waiting for SNF placement.  She is sleeping comfortable on my exam.  No complaints at this time.  Social work continues to seek placement is working with family to secure coverage for this.   Frederica Kuster, PA-C 10/16/18 1547    Malvin Johns, MD 10/16/18 1650

## 2018-10-16 NOTE — ED Notes (Signed)
BREAKFAST ORDERED  

## 2018-10-16 NOTE — Progress Notes (Signed)
CSW updated and EDP that CSW was able to determine that, per notes, pt's appeal for authorized SNF days was facilitated as a peer-to-peer which was denied.  Per notes the peer-to-peer was completed between Human and the PCP, which is unusual as the pt is in the ED and the peer-to-peer appeal is usually completed with the [facility] Director and the EDP who usually has more information.  CSW called [employer] at ph: [phone] and the office was closed.  CSW will continue to follow for D/C needs.  Alphonse Guild. Lindamarie Maclachlan, LCSW, LCAS, CSI Transitions of Care Clinical Social Worker Care Coordination Department Ph: [phone]

## 2018-10-16 NOTE — Progress Notes (Signed)
CSW received a call from [place] with DSS and was told the report would be taken but as pt was in the ED there would be no follow up on the case (it would not even go into the system until then as the [place] worker does not have a computer with her at home while she is on-call).  Report was filed and will be screened by a DSS Supervisor (Angie Polito) on Monday.  CSW will continue to follow for D/C needs.  Alphonse Guild. Leatta Alewine, LCSW, LCAS, CSI Transitions of Care Clinical Social Worker Care Coordination Department Ph: [phone]

## 2018-10-16 NOTE — ED Notes (Signed)
Dinner tray ordered.

## 2018-10-16 NOTE — Progress Notes (Signed)
CSW received a call from [place] at [place] who will contact her supervisor for advice and counsel and to see if the pt does indeed have an open case and to seek guidance on whether another report needs to be filed by this Probation officer (CSW).  [place] with APS stated she will call back ASAP tonight.  CSW will continue to follow for D/C needs.  Alphonse Guild. Shin Lamour, LCSW, LCAS, CSI Transitions of Care Clinical Social Worker Care Coordination Department Ph: [phone]

## 2018-10-17 NOTE — ED Notes (Signed)
Pt's pink sacral dressing changed, repositioned off of sacrum to sleep

## 2018-10-17 NOTE — ED Notes (Signed)
Breakfast ordered 

## 2018-10-17 NOTE — ED Notes (Addendum)
Attempted to wake pt up for lunch, pt responded briefly and then went back to sleep. Smells faintly of urine but does not feel wet nor is visibly soiled

## 2018-10-18 NOTE — ED Notes (Signed)
Patient denies pain and is resting comfortably.  

## 2018-10-19 NOTE — Progress Notes (Signed)
Physical Therapy Treatment Patient Details Name: Brianna Hunter MRN: [id number] DOB: 1928-10-29 Today's Date: 10/19/2018    History of Present Illness Pt is an 85 y/o female presenting to the ED secondary to poor living conditions and inability to care for herself. PMH includes a fib, HTN, and dementia.     PT Comments    Pt progressing towards goals. Remains unsteady without AD and requires min to mod A for steadying. Pt remains very motivated to work with therapy. Feel current recommendations appropriate. Will continue to follow acutely to maximize functional mobility independence and safety.    Follow Up Recommendations  SNF;Supervision/Assistance - 24 hour     Equipment Recommendations  None recommended by PT    Recommendations for Other Services       Precautions / Restrictions Precautions Precautions: Fall Restrictions Weight Bearing Restrictions: No    Mobility  Bed Mobility Overal bed mobility: Needs Assistance Bed Mobility: Supine to Sit;Sit to Supine     Supine to sit: Supervision Sit to supine: Supervision   General bed mobility comments: Supervision for safety.   Transfers Overall transfer level: Needs assistance Equipment used: 1 person hand held assist Transfers: Sit to/from Stand Sit to Stand: Min assist         General transfer comment: Min A for steadying assist to stand.   Ambulation/Gait Ambulation/Gait assistance: Min assist;Mod assist Gait Distance (Feet): 100 Feet Assistive device: 1 person hand held assist Gait Pattern/deviations: Step-through pattern;Decreased stride length;Narrow base of support Gait velocity: Decreased   General Gait Details: Slow, unsteady gait. Required min to mod A for steadying and required HHA. Pt easily distractible during gait.    Stairs             Wheelchair Mobility    Modified Rankin (Stroke Patients Only)       Balance Overall balance assessment: Needs assistance Sitting-balance  support: No upper extremity supported;Feet supported Sitting balance-Leahy Scale: Fair     Standing balance support: Single extremity supported;During functional activity Standing balance-Leahy Scale: Poor Standing balance comment: Reliant on at least 1 UE support                             Cognition Arousal/Alertness: Awake/alert Behavior During Therapy: WFL for tasks assessed/performed Overall Cognitive Status: History of cognitive impairments - at baseline                                        Exercises General Exercises - Upper Extremity Shoulder Flexion: AROM;Both;10 reps;Seated    General Comments        Pertinent Vitals/Pain Pain Assessment: No/denies pain    Home Living                      Prior Function            PT Goals (current goals can now be found in the care plan section) Acute Rehab PT Goals Patient Stated Goal: "to do some exercises and stop being lazy"  PT Goal Formulation: With patient Time For Goal Achievement: 10/26/18 Potential to Achieve Goals: Good Progress towards PT goals: Progressing toward goals    Frequency    Min 2X/week      PT Plan Current plan remains appropriate    Co-evaluation  AM-PAC PT "6 Clicks" Mobility   Outcome Measure  Help needed turning from your back to your side while in a flat bed without using bedrails?: None Help needed moving from lying on your back to sitting on the side of a flat bed without using bedrails?: A Little Help needed moving to and from a bed to a chair (including a wheelchair)?: A Little Help needed standing up from a chair using your arms (e.g., wheelchair or bedside chair)?: A Little Help needed to walk in hospital room?: A Lot Help needed climbing 3-5 steps with a railing? : A Lot 6 Click Score: 17    End of Session Equipment Utilized During Treatment: Gait belt Activity Tolerance: Patient tolerated treatment well Patient  left: in chair;with call bell/phone within reach;with nursing/sitter in room Nurse Communication: Mobility status PT Visit Diagnosis: Other abnormalities of gait and mobility (R26.89);Muscle weakness (generalized) (M62.81)     Time: 1045-1100 PT Time Calculation (min) (ACUTE ONLY): 15 min  Charges:  $Gait Training: 8-22 mins                     Leighton Ruff, PT, DPT  Acute Rehabilitation Services  Pager: [phone] Office: [phone]    Brianna Hunter 10/19/2018, 12:34 PM

## 2018-10-19 NOTE — ED Notes (Signed)
Breakfast Ordered 

## 2018-10-19 NOTE — Progress Notes (Addendum)
12:30pm: CSW spoke with [name], [place] APS outside of unit to discuss her findings. Crystal stated she was going to speak with the daughter for further discussion of a safe discharge plan. CSW offered assistance with arranging home health services for the patient if necessary. SNF has been recommended by PT, however the patient's insurance has denied that authorization.  9am: CSW received call from [place], [place] APS regarding this patient. Crystal is going to come to [facility] ED to visit the patient. CSW updated Luellen Pucker, Therapist, sports.  Madilyn Fireman, MSW, LCSW-A Clinical Social Worker Transitions of [place] [facility] [phone]

## 2018-10-19 NOTE — ED Notes (Signed)
Pt ate 50% of lunch and about 240 ml of liquids.

## 2018-10-19 NOTE — ED Notes (Signed)
Lunch tray ordered 

## 2018-10-19 NOTE — ED Notes (Signed)
Pt ate about 50% of breakfast and drank about 120 ml.

## 2018-10-20 NOTE — ED Notes (Signed)
Encouraging pt to eat dinner.

## 2018-10-20 NOTE — ED Notes (Signed)
Patient was given Sugar Free Cookies and cup of water.

## 2018-10-20 NOTE — Progress Notes (Signed)
2nd shift CSW updated on pt's discharge plan. CSW attempted to contact Pts son [name] [facility]: [phone]. Was unable to leave VM. CSW will continue to contact Pts family members to assist with discharge planning.   CSW informed by 1st shift CSW documentation that Pts son is agreeable to picking up pt with Home health services arranged.   CSW will continue to follow pt for discharge needs.  [place] Transitions of Care  Clinical Social Worker  Ph: [phone]

## 2018-10-20 NOTE — ED Notes (Signed)
Per SW, son advised he is not coming to pick up pt. Advised she will attempt to reach APS to assist.

## 2018-10-20 NOTE — ED Notes (Signed)
Diet was ordered for Lunch. 

## 2018-10-20 NOTE — Progress Notes (Addendum)
12pm: CSW received return call from [employer], APS stating that she had made contact with the patient's son Brianna at [phone] who stated he is on his way to [facility] to meet with his sister May to finalize a plan for caring for their mother. Brianna reported to APS that he would be willing to pick the patient up this afternoon with home health services in place.  CSW attempted to reach patient's son Brianna without success, left a voicemail requesting a return call.  9am: CSW spoke with [name], APS to discuss this patient further. Crystal stated that the patient's daughter revealed yesterday that she has a brother (patient's son) who lives in [place], [place] who was completely unaware of his mother's current condition. Crystal stated that the patient's son is Brianna Hunter and that he was willing to provide assistance and care for the patient. Brianna requested a 24 hour window for coordinating plans for care giving for his mother. Crystal states the the patient's daughter reports that the patient's monthly income is $1,301, however APS is unsure of the overall validity of that claim. Patient's daughter also informed APS that her mother "doesn't really qualify for housing assistance."  CSW to follow up with APS shortly for further information.  Madilyn Fireman, MSW, LCSW-A Clinical Social Worker Transitions of [place] Emergency Department [phone]

## 2018-10-20 NOTE — ED Notes (Signed)
Breakfast ordered 

## 2018-10-20 NOTE — Progress Notes (Signed)
CSW in contact with Pts Brianna Hunter [facility]:  [phone] to confirm discharge plan suggested by 1st shift CSW. CSW offered [facility] services upon pts discharge. [name] states that he only expects to be in town for 2 days. [name] goes into detail and explains that pt can not discharge with him until placement is established. [name] states that he has sick spouse and living condition barriers that would prevent him from accepting pt into his home.   [name] explains that his goal is to have LTC medicaid application completed with the assistance of APS worker [employer] and for pt to be placed for LTC.   CSW contacted APS  Worker and left VM requesting call back.   [place] Transitions of Care  Clinical Social Worker  Ph: [phone]

## 2018-10-20 NOTE — ED Notes (Signed)
Eyeglasses on bedside table.

## 2018-10-21 ENCOUNTER — Encounter (HOSPITAL_COMMUNITY): Payer: Self-pay | Admitting: Emergency Medicine

## 2018-10-21 LAB — COMPREHENSIVE METABOLIC PANEL
ALT: 14 U/L (ref 0–44)
AST: 16 U/L (ref 15–41)
Albumin: 2.8 g/dL — ABNORMAL LOW (ref 3.5–5.0)
Alkaline Phosphatase: 59 U/L (ref 38–126)
Anion gap: 9 (ref 5–15)
BUN: 20 mg/dL (ref 8–23)
CO2: 28 mmol/L (ref 22–32)
Calcium: 9.7 mg/dL (ref 8.9–10.3)
Chloride: 102 mmol/L (ref 98–111)
Creatinine, Ser: 1.03 mg/dL — ABNORMAL HIGH (ref 0.44–1.00)
GFR calc Af Amer: 56 mL/min — ABNORMAL LOW (ref >60)
GFR calc non Af Amer: 48 mL/min — ABNORMAL LOW (ref >60)
Glucose, Bld: 186 mg/dL — ABNORMAL HIGH (ref 70–99)
Potassium: 3.7 mmol/L (ref 3.5–5.1)
Sodium: 139 mmol/L (ref 135–145)
Total Bilirubin: 0.5 mg/dL (ref 0.3–1.2)
Total Protein: 7.3 g/dL (ref 6.5–8.1)

## 2018-10-21 LAB — CBC WITH DIFFERENTIAL/PLATELET
Abs Immature Granulocytes: 0.01 10*3/uL (ref 0.00–0.07)
Basophils Absolute: 0 10*3/uL (ref 0.0–0.1)
Basophils Relative: 0 %
Eosinophils Absolute: 0 10*3/uL (ref 0.0–0.5)
Eosinophils Relative: 1 %
HCT: 31.5 % — ABNORMAL LOW (ref 36.0–46.0)
Hemoglobin: 9.9 g/dL — ABNORMAL LOW (ref 12.0–15.0)
Immature Granulocytes: 0 %
Lymphocytes Relative: 24 %
Lymphs Abs: 1.1 10*3/uL (ref 0.7–4.0)
MCH: 23.3 pg — ABNORMAL LOW (ref 26.0–34.0)
MCHC: 31.4 g/dL (ref 30.0–36.0)
MCV: 74.1 fL — ABNORMAL LOW (ref 80.0–100.0)
Monocytes Absolute: 0.5 10*3/uL (ref 0.1–1.0)
Monocytes Relative: 11 %
Neutro Abs: 3 10*3/uL (ref 1.7–7.7)
Neutrophils Relative %: 64 %
Platelets: 186 10*3/uL (ref 150–400)
RBC: 4.25 MIL/uL (ref 3.87–5.11)
RDW: 14.9 % (ref 11.5–15.5)
WBC: 4.6 10*3/uL (ref 4.0–10.5)
nRBC: 0 % (ref 0.0–0.2)

## 2018-10-21 NOTE — ED Provider Notes (Signed)
Patient here for NH placement after daughter reported that she is unable to care for patient at home due to no power or water at home. Patient discharged from [facility] on 7/22.2020 after admission for PE , paroxysmal a fib, multiple electrolyte abnormalities.  On patient return on 8/2 for NH placement, no labs initially done and patient appeared stable from d/c Today noted bp 94/57 and hr 100 Patietn was to have followed up with her pmd op due to above, additionally anemic and now anticoagulated. On lab evaluation today, hgb increased to 9.9 and other electrolytes normal although ongoing hyperglycemia.   EKG with hr normalized ED ECG REPORT EKG Interpretation  Date/Time:  Wednesday October 21 2018 12:24:14 EDT Ventricular Rate:  79 PR Interval:  148 QRS Duration: 150 QT Interval:  408 QTC Calculation: 467 R Axis:   -70 Text Interpretation:  Normal sinus rhythm Right bundle branch block Left anterior fascicular block Bifascicular block  Abnormal ECG HEART RATE DECREASED SINCE last tracing Confirmed by Pattricia Boss [phone]) on 10/21/2018 1:56:35 PM  Patient's bp increased and hr normalized Will encourage po intake and rn advised regarding parameters to inform provider if bp decreased or hr increased.    Pattricia Boss, MD 10/21/18 [phone]

## 2018-10-21 NOTE — ED Notes (Signed)
Changed pt brief.

## 2018-10-21 NOTE — ED Notes (Signed)
Patient sitting in a Recliner in room at this time.

## 2018-10-21 NOTE — ED Notes (Signed)
Patient was given a snack and drink. A Regular Diet was ordered for lunch.

## 2018-10-21 NOTE — ED Provider Notes (Signed)
Emergency Medicine Observation Re-evaluation Note  Brianna Hunter is a 85 y.o. female, seen on rounds today.  Pt initially presented to the ED for complaints of Requesting Nursing Home Placement   Physical Exam  BP (!) 102/51 (BP Location: Left Arm)   Pulse 60   Temp 98.5 F (36.9 C) (Oral)   Resp 16   Ht 4\' 9"  (1.448 m)   Wt 46.6 kg   SpO2 100%   BMI 22.23 kg/m  Physical Exam Sleeping comfortably  Normal respiratory rate Stage 2 sacral decubitus ulcers- well appearing  ED Course / MDM  EKG:  Clinical Course as of Oct 21 717  Wed Oct 21, 2018  1154 BP: 113/62 [DR]    Clinical Course User Index [DR] Pattricia Boss, MD   I have reviewed the labs performed to date as well as medications administered while in observation.  Recent changes in the last 24 hours include NONE  Results for orders placed or performed during the hospital encounter of 10/11/18  SARS Coronavirus [facility] order, Performed in [facility] hospital lab) Nasopharyngeal Nasopharyngeal Swab   Specimen: Nasopharyngeal Swab  Result Value Ref Range   SARS Coronavirus 2 NEGATIVE NEGATIVE    Plan  Current plan is for pl;acement, awaiting CSW /CM placement. Patient is not under full IVC at this time.   Margarita Mail, PA-C 10/22/18 [phone]    Sherwood Gambler, MD 10/22/18 [phone]

## 2018-10-21 NOTE — Progress Notes (Addendum)
1:30pm: CSW received call from [employer] at [place] requesting this patient receive a psychiatric evaluation so that APS can proceed with pursuing guardianship of this patient. CSW notified patient's RN Luellen Pucker who will have TTS ordered.  1pm: CSW attempted to reach patient's son without success, no voicemail option available.  9am: CSW met with patient at bedside to assess her orientation and overall capability to care for herself independently. CSW asked patient various questions about herself, including her name, birthday, prior living arrangements, children, marital status, etc. Patient was unable to tell CSW her correct birthday, age, address, year, and address. Patient informed CSW that she had four children in total but could only recall the daughter's name that she was living with prior to her being at [facility]. [place] informed [name] that this patient returning home alone is not a safe option.  8am: CSW spoke with [name], [place] APS who is in agreement that this patient has been abandoned her her son and daughter due to their refusals to come and pick her up. Crystal to staff case with her supervisor and will return call to [place].  CSW to obtain guidance on how to proceed.  Madilyn Fireman, MSW, LCSW-A Clinical Social Worker Transitions of [place] [facility] [phone]

## 2018-10-22 DIAGNOSIS — F0390 Unspecified dementia without behavioral disturbance: Secondary | ICD-10-CM

## 2018-10-22 NOTE — ED Notes (Signed)
Pt resting quietly at this time in recliner.

## 2018-10-22 NOTE — ED Notes (Signed)
Breakfast ordered 

## 2018-10-22 NOTE — ED Notes (Signed)
Attempted to call pt's daughter by cell phone and home phone no answer.

## 2018-10-22 NOTE — ED Notes (Addendum)
Social work contacted this Therapist, sports and advised the pt will be holding for a while because APS (adult protective services) due to family abandoning her.

## 2018-10-22 NOTE — Consult Note (Signed)
Telepsych Consultation   Reason for Consult: Psych evaluation Referring Physician: [facility] Location of Patient:[facility] Location of Provider: [place] Department  Patient Identification: Brianna Hunter MRN:  [id number] Principal Diagnosis: Dementia [facility]) Diagnosis:  Principal Problem:   Dementia ([place])   Total Time spent with patient: 45 minutes  Subjective:   Brianna Hunter is a 85 y.o. female patient admitted to the [facility] for a psychiatric evaluation so that APS can proceed with pursuing guardianship of this patient .  HPI: This patient is currently a poor historian due to what appears to be either early or advanced dementia. She reports, "I fell & I hurt myself. Then, I fell again & my daughter picked me up, washed me up & brought me up here. I have been in this place for 6 months because my daughter left me here 6 months ago. I believe she left something in the car, went to get it, didn't come back".  Past Psychiatric History: Dementia.  Risk to Self: No Risk to Others: No Prior Inpatient Therapy: Unsure Prior Outpatient Therapy: unsure, patient is unable to provide this information  Past Medical History:  Past Medical History:  Diagnosis Date  . Anxiety   . Arthritis    celebrex use off & on for arthritis- hands, back   . Back pain   . Bifascicular block   . Breast cancer ([place])    a. DCIS s/p R lumpectomy  . Dementia ([place])   . Hyperlipidemia   . Hypertension   . Palpitations    a. holter (7/11):  isolated PVCs and PACs  . Rheumatism   . Valvular heart disease    a. Echo (7/11):  EF 55-60%, Gr 1 DD, mild-mod AI, mild-mod MR, mod PI, PASP 30;  b. Echo (09/2010):  Mild LVH, EF 60%, Gr 1 DD, mild to mod AI, mild MR, mild TR, mild to mod PI, PASP 36.;   c.  Echo (05/2013):  EF 60-65%, no RWMA, mild AI, mild to mod PI     Past Surgical History:  Procedure Laterality Date  . ABDOMINAL HYSTERECTOMY     partial   . APPENDECTOMY    . EAR CYST  EXCISION  11/07/2011   Procedure: CYST REMOVAL;  Surgeon: Madilyn Hook, DO;  Location: [facility];  Service: General;  Laterality: Right;  Removal Cyst Right Upper Chest  . EYE SURGERY     left eye 2013  . JOINT REPLACEMENT     2008- L knee  . KNEE SURGERY     Left  . SHOULDER SURGERY     Left   Family History:  Family History  Problem Relation Age of Onset  . Diabetes Mother   . Other Mother        Bleeding disorder  . Diabetes Father   . Other Father        Bleeding disorder   Family Psychiatric  History: patient is unable to provide this information. Social History:  Social History   Substance and Sexual Activity  Alcohol Use No     Social History   Substance and Sexual Activity  Drug Use No    Social History   Socioeconomic History  . Marital status: Widowed    Spouse name: Not on file  . Number of children: Not on file  . Years of education: Not on file  . Highest education level: Not on file  Occupational History  . Occupation: Retired  Scientific laboratory technician  . Emergency planning/management officer  strain: Not on file  . Food insecurity    Worry: Not on file    Inability: Not on file  . Transportation needs    Medical: Not on file    Non-medical: Not on file  Tobacco Use  . Smoking status: Never Smoker  . Smokeless tobacco: Never Used  Substance and Sexual Activity  . Alcohol use: No  . Drug use: No  . Sexual activity: Not Currently  Lifestyle  . Physical activity    Days per week: Not on file    Minutes per session: Not on file  . Stress: Not on file  Relationships  . Social Herbalist on phone: Not on file    Gets together: Not on file    Attends religious service: Not on file    Active member of club or organization: Not on file    Attends meetings of clubs or organizations: Not on file    Relationship status: Not on file  Other Topics Concern  . Not on file  Social History Narrative   Widowed   Lives in assisted living and is involved with taking care of  her granddaughter   Additional Social History: Allergies:   Allergies  Allergen Reactions  . Milk-Related Compounds Diarrhea and Other (See Comments)    Frequent bowel movements   . Augmentin [Amoxicillin-Pot Clavulanate] Diarrhea  . Tape Other (See Comments)    Caused an inflamed area on the skin   Labs:  Results for orders placed or performed during the hospital encounter of 10/11/18 (from the past 48 hour(s))  CBC with Differential/Platelet     Status: Abnormal   Collection Time: 10/21/18 12:30 PM  Result Value Ref Range   WBC 4.6 4.0 - 10.5 K/uL   RBC 4.25 3.87 - 5.11 MIL/uL   Hemoglobin 9.9 (L) 12.0 - 15.0 g/dL   HCT 31.5 (L) 36.0 - 46.0 %   MCV 74.1 (L) 80.0 - 100.0 fL   MCH 23.3 (L) 26.0 - 34.0 pg   MCHC 31.4 30.0 - 36.0 g/dL   RDW 14.9 11.5 - 15.5 %   Platelets 186 150 - 400 K/uL   nRBC 0.0 0.0 - 0.2 %   Neutrophils Relative % 64 %   Neutro Abs 3.0 1.7 - 7.7 K/uL   Lymphocytes Relative 24 %   Lymphs Abs 1.1 0.7 - 4.0 K/uL   Monocytes Relative 11 %   Monocytes Absolute 0.5 0.1 - 1.0 K/uL   Eosinophils Relative 1 %   Eosinophils Absolute 0.0 0.0 - 0.5 K/uL   Basophils Relative 0 %   Basophils Absolute 0.0 0.0 - 0.1 K/uL   Immature Granulocytes 0 %   Abs Immature Granulocytes 0.01 0.00 - 0.07 K/uL    Comment: Performed at [facility], [address], [place], [place] [address]  Comprehensive metabolic panel     Status: Abnormal   Collection Time: 10/21/18 12:30 PM  Result Value Ref Range   Sodium 139 135 - 145 mmol/L   Potassium 3.7 3.5 - 5.1 mmol/L   Chloride 102 98 - 111 mmol/L   CO2 28 22 - 32 mmol/L   Glucose, Bld 186 (H) 70 - 99 mg/dL   BUN 20 8 - 23 mg/dL   Creatinine, Ser 1.03 (H) 0.44 - 1.00 mg/dL   Calcium 9.7 8.9 - 10.3 mg/dL   Total Protein 7.3 6.5 - 8.1 g/dL   Albumin 2.8 (L) 3.5 - 5.0 g/dL   AST 16  15 - 41 U/L   ALT 14 0 - 44 U/L   Alkaline Phosphatase 59 38 - 126 U/L   Total Bilirubin 0.5 0.3 - 1.2 mg/dL   GFR calc non Af Amer 48 (L)  >60 mL/min   GFR calc Af Amer 56 (L) >60 mL/min   Anion gap 9 5 - 15    Comment: Performed at [facility] [address], [place], [place] [address]   Medications:  Current Facility-Administered Medications  Medication Dose Route Frequency Provider Last Rate Last Dose  . acetaminophen (TYLENOL) tablet 1,000 mg  1,000 mg Oral Q6H PRN Sherwood Gambler, MD      . irbesartan (AVAPRO) tablet 300 mg  300 mg Oral Daily Sherwood Gambler, MD   300 mg at 10/22/18 0934   And  . amLODipine (NORVASC) tablet 5 mg  5 mg Oral Daily Sherwood Gambler, MD   5 mg at 10/22/18 0934   And  . hydrochlorothiazide (HYDRODIURIL) tablet 25 mg  25 mg Oral Daily Sherwood Gambler, MD   25 mg at 10/22/18 0934  . apixaban (ELIQUIS) tablet 5 mg  5 mg Oral BID Kris Mouton, RPH   5 mg at 10/22/18 5329  . cycloSPORINE (RESTASIS) 0.05 % ophthalmic emulsion 1 drop  1 drop Both Eyes Q12H Sherwood Gambler, MD   1 drop at 10/22/18 0934  . latanoprost (XALATAN) 0.005 % ophthalmic solution 1 drop  1 drop Both Eyes QHS Sherwood Gambler, MD   1 drop at 10/22/18 0934  . vitamin C (ASCORBIC ACID) tablet 250 mg  250 mg Oral Daily Sherwood Gambler, MD   250 mg at 10/22/18 9242   Current Outpatient Medications  Medication Sig Dispense Refill  . acetaminophen (TYLENOL) 500 MG tablet Take 500-1,000 mg by mouth every 6 (six) hours as needed for mild pain.     . Ascorbic Acid (VITAMIN C PO) Take 1 tablet by mouth every other day.     . citalopram (CELEXA) 20 MG tablet Take 20 mg by mouth daily.    . cycloSPORINE (RESTASIS) 0.05 % ophthalmic emulsion Place 1 drop into both eyes every 12 (twelve) hours.    Marland Kitchen latanoprost (XALATAN) 0.005 % ophthalmic solution Place 1 drop into both eyes at bedtime.    . magnesium hydroxide (MILK OF MAGNESIA) 400 MG/5ML suspension Take 15-30 mLs by mouth daily as needed for mild constipation.    . Olmesartan-amLODIPine-HCTZ 40-5-25 MG TABS Take 1 tablet by mouth daily.    Alveda Reasons 10 MG TABS tablet Take 10  mg by mouth daily at 6 PM.     . bimatoprost (LUMIGAN) 0.01 % SOLN Place 1 drop into both eyes at bedtime.    . Eliquis DVT/PE Starter Pack (ELIQUIS STARTER PACK) 5 MG TABS Take as directed on package: start with two-5mg  tablets twice daily for 7 days. On day 8, switch to one-5mg  tablet twice daily. (Patient not taking: Reported on 10/12/2018) 1 each 0   Musculoskeletal: Strength & Muscle Tone: within normal limits Gait & Station: normal Patient leans: N/A  Psychiatric Specialty Exam: Physical Exam  Nursing note and vitals reviewed. Constitutional: She appears well-developed.  Eyes: Pupils are equal, round, and reactive to light.  Neck: Normal range of motion.  Cardiovascular: Normal rate.  Respiratory: Effort normal.  Genitourinary:    Genitourinary Comments: Deferred   Neurological: She is alert.  Disoriented to place time & situation.  Skin: Skin is warm.    Review of Systems  Constitutional: Negative  for chills and fever.  Respiratory: Negative for cough, shortness of breath and wheezing.   Cardiovascular: Negative for chest pain and palpitations.  Gastrointestinal: Negative for vomiting.  Psychiatric/Behavioral: Positive for depression. Negative for hallucinations, memory loss, substance abuse and suicidal ideas. The patient has insomnia. The patient is not nervous/anxious.     Blood pressure (!) 103/50, pulse 64, temperature 98.1 F (36.7 C), temperature source Oral, resp. rate 15, height 4\' 9"  (1.448 m), weight 46.6 kg, SpO2 96 %.Body mass index is 22.23 kg/m.  General Appearance: Disheveled  Eye Contact:  Fair  Speech:  Clear and Coherent and Normal Rate  Volume:  Decreased  Mood:  Euphoric  Affect:  Congruent  Thought Process:  Disorganized, Irrelevant and Descriptions of Associations: Tangential  Orientation:  Other:  Oriented to name only  Thought Content:  Illogical and Tangential  Suicidal Thoughts:  No  Homicidal Thoughts:  No  Memory:  Immediate;    Poor Recent;   Poor Remote;   Poor  Judgement:  Impaired  Insight:  Lacking  Psychomotor Activity:  Decreased  Concentration:  Concentration: Fair and Attention Span: Fair  Recall:  Poor  Fund of Knowledge:  Poor  Language:  Fair  Akathisia:  NA  Handed:  Right  AIMS (if indicated):     Assets:  Desire for Improvement Social Support  ADL's:  Intact  Cognition:  WNL  Sleep:      Treatment Plan Summary: Recommending Memory Care palcement  Disposition: This patient meets the criteria for memory care placement. Recommends Social work consult.  This service was provided via telemedicine using a 2-way, interactive audio and video technology.  Names of all persons participating in this telemedicine service and their role in this encounter. Name: Lindell Spar Role:   Name:  Role:  Name: Role:  Name: Role:    Lindell Spar, NP, PMHNP, FNP-Bc. 10/22/2018 11:16 AM

## 2018-10-22 NOTE — BH Assessment (Signed)
[facility] Assessment Progress Note    TTS Consult Order Cancelled.  FNP-Agnes Nwoko to see patient and consult with Dr. Dwyane Dee for disposition/recommendations.

## 2018-10-22 NOTE — ED Notes (Signed)
Pt resting throughout night. Cleaned pt up and changed brief.

## 2018-10-22 NOTE — Progress Notes (Signed)
Physical Therapy Treatment Patient Details Name: Brianna Hunter MRN: [id number] DOB: 11/27/1928 Today's Date: 10/22/2018    History of Present Illness Pt is an 85 y/o female presenting to the ED secondary to poor living conditions and inability to care for herself. PMH includes a fib, HTN, and dementia.     PT Comments    Pt progressing towards goals. Remains unsteady and easily distracted during gait. Was able to redirect to task easily. Reviewed seated HEP this session. Per notes, pt has no family support at home. Current recommendations appropriate. Will continue to follow acutely to maximize functional mobility independence and safety.     Follow Up Recommendations  SNF;Supervision/Assistance - 24 hour     Equipment Recommendations  None recommended by PT    Recommendations for Other Services       Precautions / Restrictions Precautions Precautions: Fall Restrictions Weight Bearing Restrictions: No    Mobility  Bed Mobility Overal bed mobility: Needs Assistance Bed Mobility: Supine to Sit     Supine to sit: Supervision     General bed mobility comments: Supervision for safety. Increased time to come to EOB secondary to stiffness.   Transfers Overall transfer level: Needs assistance Equipment used: 1 person hand held assist Transfers: Sit to/from Stand Sit to Stand: Min assist         General transfer comment: Min A for steadying assist to stand.   Ambulation/Gait Ambulation/Gait assistance: Min assist Gait Distance (Feet): 100 Feet Assistive device: 1 person hand held assist Gait Pattern/deviations: Step-through pattern;Decreased stride length;Narrow base of support Gait velocity: Decreased   General Gait Details: Slow, mildly unsteady gait. Steadiness seems to have improved mildly since previous session. Continues to have very narrow BOS when ambulating. Easily distracted during gait.    Stairs             Wheelchair Mobility    Modified  Rankin (Stroke Patients Only)       Balance Overall balance assessment: Needs assistance Sitting-balance support: No upper extremity supported;Feet supported Sitting balance-Leahy Scale: Fair     Standing balance support: Single extremity supported;During functional activity Standing balance-Leahy Scale: Poor Standing balance comment: Reliant on at least 1 UE support                             Cognition Arousal/Alertness: Awake/alert Behavior During Therapy: WFL for tasks assessed/performed Overall Cognitive Status: History of cognitive impairments - at baseline                                        Exercises General Exercises - Lower Extremity Ankle Circles/Pumps: AROM;Both;20 reps Quad Sets: AROM;Both;10 reps Long Arc Quad: AROM;Both;10 reps Hip Flexion/Marching: AROM;Both;10 reps;Seated(required multimodal cues for sequencing)    General Comments        Pertinent Vitals/Pain Pain Assessment: No/denies pain    Home Living                      Prior Function            PT Goals (current goals can now be found in the care plan section) Acute Rehab PT Goals Patient Stated Goal: to get out of the bed PT Goal Formulation: With patient Time For Goal Achievement: 10/26/18 Potential to Achieve Goals: Good Progress towards PT goals: Progressing toward goals    Frequency  Min 2X/week      PT Plan Current plan remains appropriate    Co-evaluation              AM-PAC PT "6 Clicks" Mobility   Outcome Measure  Help needed turning from your back to your side while in a flat bed without using bedrails?: None Help needed moving from lying on your back to sitting on the side of a flat bed without using bedrails?: A Little Help needed moving to and from a bed to a chair (including a wheelchair)?: A Little Help needed standing up from a chair using your arms (e.g., wheelchair or bedside chair)?: A Little Help needed to  walk in hospital room?: A Lot Help needed climbing 3-5 steps with a railing? : A Lot 6 Click Score: 17    End of Session Equipment Utilized During Treatment: Gait belt Activity Tolerance: Patient tolerated treatment well Patient left: in chair;with call bell/phone within reach Nurse Communication: Mobility status PT Visit Diagnosis: Other abnormalities of gait and mobility (R26.89);Muscle weakness (generalized) (M62.81)     Time: 1015-1030 PT Time Calculation (min) (ACUTE ONLY): 15 min  Charges:  $Gait Training: 8-22 mins                     Leighton Ruff, PT, DPT  Acute Rehabilitation Services  Pager: [phone] Office: [phone]    Rudean Hitt 10/22/2018, 10:47 AM

## 2018-10-23 NOTE — ED Notes (Signed)
Pt resting comfortably. No complaints at this time.

## 2018-10-23 NOTE — ED Notes (Addendum)
Pt back to bed.

## 2018-10-23 NOTE — Progress Notes (Addendum)
8:15am: CSW received return call from [employer] stating that this patient's son has completed a Medicaid application on her behalf and that it will be processed by staff as soon as it is received, and it could take up to 45 days to be approved. Crystal stated that the patient's son has requested to be the guardian of this patient and is scheduled to be given a court date some time this morning for the initial hearing.  CSW submitted the results of the psychiatric evaluation to Crystal to be added to the patient's APS file.  7:40am: CSW attempted to reach [employer] of APS to obtain any updates about this patient, no answer so a voicemail was left requesting a return call.  Madilyn Fireman, MSW, LCSW-A Clinical Social Worker [facility] [phone]

## 2018-10-23 NOTE — Progress Notes (Signed)
CSW in contact with [name], APS worker.   CSW faxed placement referral off to the following SNF locations  -[facility] team will continue to follow pt for any discharge needs.

## 2018-10-23 NOTE — ED Notes (Signed)
Pt eating dinner

## 2018-10-23 NOTE — ED Notes (Signed)
Dinner tray called

## 2018-10-23 NOTE — Progress Notes (Signed)
Pt accepted to [facility]. Family has completed and mailed off medicaid application.

## 2018-10-23 NOTE — ED Notes (Signed)
Pt placed on purewick 

## 2018-10-24 NOTE — Progress Notes (Signed)
CSW left voicemail with [facility] admissions to follow-up to find out a timeframe for patient's discharge to the facility.  Lamonte Richer, LCSW, [place] Worker II [phone]

## 2018-10-24 NOTE — Progress Notes (Addendum)
2:00PM: CSW left v/m with patient's daughter regarding [facility] versus [employer]. CSW spoke with patient who had no preferences.   CSW spoke with admissions  At [facility] and was informed they were not taking individuals due to a new outbreak of COVID. CSW will follow-up with legal guardian.  Lamonte Richer, LCSW, [place] Worker II [phone]

## 2018-10-24 NOTE — ED Notes (Signed)
Cranberry Juice and Sugar Free Cookies was given to Patient. A Regular Diet was ordered for Dinner.

## 2018-10-24 NOTE — ED Notes (Signed)
Pt got up from bed on her own and ambulating in room. Pt assisted back to bed - staff x 2. Pt cleansed of urine and brief changed. Call bell placed near pt - RN encouraged pt to use it prior to attempting to get up. Pt returned demonstrated use and voiced understanding.

## 2018-10-24 NOTE — ED Notes (Signed)
Diet was ordered for Lunch. 

## 2018-10-25 NOTE — ED Notes (Signed)
Dinner tray delivered to pt - states does not want eat at this time.

## 2018-10-25 NOTE — ED Provider Notes (Signed)
Emergency department holding reevaluation note  85 year old female has been in the department for over 329 hours at this time.  Originally presented for nursing home placement.  Patient medically cleared by previous team is still awaiting placement at this time.  Most recent labs 10/21/2018 CMP nonacute CBC with hemoglobin 9.9 appears stable from prior  10/13/2018 COVID-19 negative  Most recent vital signs from 11:59 AM today: Temperature 98.4 F, pulse of 79 bpm, blood pressure 96/68, respiratory rate 16, SPO2 98%. - Review of most recent social work note from 10/24/2018 shows they attempted placement at [facility] however this was unsuccessful, they continue to seek placement. Physical Exam  BP 96/68 (BP Location: Right Arm)   Pulse 79   Temp 98.4 F (36.9 C) (Oral)   Resp 16   Ht 4\' 9"  (1.448 m)   Wt 46.6 kg   SpO2 98%   BMI 22.23 kg/m   Physical Exam Constitutional:      General: She is not in acute distress.    Appearance: Normal appearance. She is well-developed. She is not ill-appearing or diaphoretic.  HENT:     Head: Normocephalic and atraumatic.     Right Ear: External ear normal.     Left Ear: External ear normal.     Nose: Nose normal.  Eyes:     General: Vision grossly intact. Gaze aligned appropriately.     Pupils: Pupils are equal, round, and reactive to light.  Neck:     Musculoskeletal: Normal range of motion.     Trachea: Trachea and phonation normal. No tracheal deviation.  Pulmonary:     Effort: Pulmonary effort is normal. No respiratory distress.  Musculoskeletal: Normal range of motion.  Skin:    General: Skin is warm and dry.  Neurological:     Mental Status: She is alert.     GCS: GCS eye subscore is 4. GCS verbal subscore is 5. GCS motor subscore is 6.     Comments: Speech is clear and goal oriented, follows commands Major Cranial nerves without deficit, no facial droop Moves extremities without ataxia, coordination intact  Psychiatric:        Behavior: Behavior normal.    ED Course/Procedures   Clinical Course as of Oct 25 1215  Wed Oct 21, 2018  1154 BP: 113/62 [DR]    Clinical Course User Index [DR] Pattricia Boss, MD    Procedures  MDM  Patient evaluated she is sitting comfortably in bed and in no acute distress.  She reports that she enjoyed her breakfast this morning and is feeling well.  She denies any complaints at this time, she reports she would like to call her daughter, nursing staff to coordinate this.  At this time there does not appear to be any evidence of an acute emergency medical condition and the patient appears stable for continued observation until skilled nursing facility placement.  Note: Portions of this report may have been transcribed using voice recognition software. Every effort was made to ensure accuracy; however, inadvertent computerized transcription errors may still be present.   Gari Crown 10/25/18 1217    Gareth Morgan, MD 10/27/18 [phone]

## 2018-10-25 NOTE — ED Notes (Signed)
Patient denies pain and is resting comfortably.  

## 2018-10-25 NOTE — ED Notes (Signed)
Eyeglasses at bedside.

## 2018-10-26 DIAGNOSIS — Z743 Need for continuous supervision: Secondary | ICD-10-CM | POA: Diagnosis not present

## 2018-10-26 DIAGNOSIS — R5381 Other malaise: Secondary | ICD-10-CM | POA: Diagnosis not present

## 2018-10-26 DIAGNOSIS — R279 Unspecified lack of coordination: Secondary | ICD-10-CM | POA: Diagnosis not present

## 2018-10-26 LAB — SARS CORONAVIRUS 2 BY RT PCR (HOSPITAL ORDER, PERFORMED IN ~~LOC~~ HOSPITAL LAB): SARS Coronavirus 2: NEGATIVE

## 2018-10-26 NOTE — ED Notes (Addendum)
Breakfast tray ordered 

## 2018-10-26 NOTE — Progress Notes (Addendum)
12pm: CSW spoke with [name] at [facility] who states this patient can be transported to the facility today. [name] was able to confirm that her Medicaid is pending. CSW updated [employer], APS of plan and she is agreeable. CSW notified patient's RN Lowella Petties.  8am: CSW spoke with [name] at [facility] to confirm that this patient has been accepted at the facility. [name] reports that due to a recent COVID case at the [facility] site, the patient was accepted at the [facility] site. CSW informed [name] that per CSW note, the patient's son submitted the Medicaid application on her behalf. [name] to have the facility business office contact DSS to verify that the Medicaid application was sent.  CSW spoke with [name], APS to discuss this patient. Crystal states the Medicaid application was mailed in on Friday.   CSW attempted to reach patient's son [name] at [phone] without success - voicemail was left requesting a return call.  CSW confirmed with patient's RN Lowella Petties that she has not been served with guardianship petition yet.  Madilyn Fireman, MSW, LCSW-A Clinical Social Worker [facility] [place] [phone]

## 2018-10-26 NOTE — TOC Transition Note (Signed)
Transition of Care [facility]) - CM/SW Discharge Note   Patient Details  Name: Brianna Hunter MRN: [id number] Date of Birth: 1928-05-16  Transition of Care [facility] CM/SW Contact:  Archie Endo, LCSW Phone Number: 10/26/2018, 12:28 PM   Clinical Narrative:     CSW spoke with [name] at [facility] to confirm that this patient can be transported there. CSW updated patient's APS social worker [employer] who will update the patient's daughter of plan. Contact information for facility was provided to [facility] for APS documentation. CSW spoke with Lowella Petties, RN to have rapid COVID screening ordered for this patient to discharge to a facility.  Patient will go to room 204A. Patient will transported by [facility] after additional COVID screening is obtained and resulted. The number to call for report is [phone]. Please fax COVID results and AVS to [phone] prior to patient discharging.   Final next level of care: Skilled Nursing Facility Barriers to Discharge: No Barriers Identified   Patient Goals and CMS Choice Patient states their goals for this hospitalization and ongoing recovery are:: To get therapy      Discharge Placement              Patient chooses bed at: [facility] Patient to be transferred to facility by: [facility] Name of family member notified: [name], APS and May, daughter Patient and family notified of of transfer: 10/26/18  Discharge Plan and Services                                     Social Determinants of Health (SDOH) Interventions     Readmission Risk Interventions No flowsheet data found.

## 2018-10-27 DIAGNOSIS — I2699 Other pulmonary embolism without acute cor pulmonale: Secondary | ICD-10-CM | POA: Diagnosis not present

## 2018-10-27 DIAGNOSIS — I10 Essential (primary) hypertension: Secondary | ICD-10-CM | POA: Diagnosis not present

## 2018-10-27 DIAGNOSIS — F329 Major depressive disorder, single episode, unspecified: Secondary | ICD-10-CM | POA: Diagnosis not present

## 2018-10-27 DIAGNOSIS — M1990 Unspecified osteoarthritis, unspecified site: Secondary | ICD-10-CM | POA: Diagnosis not present

## 2018-10-29 DIAGNOSIS — I10 Essential (primary) hypertension: Secondary | ICD-10-CM | POA: Diagnosis not present

## 2018-10-29 DIAGNOSIS — M6281 Muscle weakness (generalized): Secondary | ICD-10-CM | POA: Diagnosis not present

## 2018-10-29 DIAGNOSIS — I2699 Other pulmonary embolism without acute cor pulmonale: Secondary | ICD-10-CM | POA: Diagnosis not present

## 2018-10-29 DIAGNOSIS — E43 Unspecified severe protein-calorie malnutrition: Secondary | ICD-10-CM | POA: Diagnosis not present

## 2018-10-31 DIAGNOSIS — M6281 Muscle weakness (generalized): Secondary | ICD-10-CM | POA: Diagnosis not present

## 2018-10-31 DIAGNOSIS — M1990 Unspecified osteoarthritis, unspecified site: Secondary | ICD-10-CM | POA: Diagnosis not present

## 2018-10-31 DIAGNOSIS — R262 Difficulty in walking, not elsewhere classified: Secondary | ICD-10-CM | POA: Diagnosis not present

## 2018-10-31 DIAGNOSIS — F0391 Unspecified dementia with behavioral disturbance: Secondary | ICD-10-CM | POA: Diagnosis not present

## 2018-10-31 DIAGNOSIS — N179 Acute kidney failure, unspecified: Secondary | ICD-10-CM | POA: Diagnosis not present

## 2018-10-31 DIAGNOSIS — I2699 Other pulmonary embolism without acute cor pulmonale: Secondary | ICD-10-CM | POA: Diagnosis not present

## 2018-10-31 DIAGNOSIS — I352 Nonrheumatic aortic (valve) stenosis with insufficiency: Secondary | ICD-10-CM | POA: Diagnosis not present

## 2018-11-02 DIAGNOSIS — M6281 Muscle weakness (generalized): Secondary | ICD-10-CM | POA: Diagnosis not present

## 2018-11-02 DIAGNOSIS — N179 Acute kidney failure, unspecified: Secondary | ICD-10-CM | POA: Diagnosis not present

## 2018-11-02 DIAGNOSIS — F0391 Unspecified dementia with behavioral disturbance: Secondary | ICD-10-CM | POA: Diagnosis not present

## 2018-11-02 DIAGNOSIS — M1990 Unspecified osteoarthritis, unspecified site: Secondary | ICD-10-CM | POA: Diagnosis not present

## 2018-11-02 DIAGNOSIS — I352 Nonrheumatic aortic (valve) stenosis with insufficiency: Secondary | ICD-10-CM | POA: Diagnosis not present

## 2018-11-02 DIAGNOSIS — I2699 Other pulmonary embolism without acute cor pulmonale: Secondary | ICD-10-CM | POA: Diagnosis not present

## 2018-11-02 DIAGNOSIS — R262 Difficulty in walking, not elsewhere classified: Secondary | ICD-10-CM | POA: Diagnosis not present

## 2018-11-03 DIAGNOSIS — I352 Nonrheumatic aortic (valve) stenosis with insufficiency: Secondary | ICD-10-CM | POA: Diagnosis not present

## 2018-11-03 DIAGNOSIS — M1990 Unspecified osteoarthritis, unspecified site: Secondary | ICD-10-CM | POA: Diagnosis not present

## 2018-11-03 DIAGNOSIS — R262 Difficulty in walking, not elsewhere classified: Secondary | ICD-10-CM | POA: Diagnosis not present

## 2018-11-03 DIAGNOSIS — M6281 Muscle weakness (generalized): Secondary | ICD-10-CM | POA: Diagnosis not present

## 2018-11-03 DIAGNOSIS — I2699 Other pulmonary embolism without acute cor pulmonale: Secondary | ICD-10-CM | POA: Diagnosis not present

## 2018-11-03 DIAGNOSIS — F0391 Unspecified dementia with behavioral disturbance: Secondary | ICD-10-CM | POA: Diagnosis not present

## 2018-11-03 DIAGNOSIS — N179 Acute kidney failure, unspecified: Secondary | ICD-10-CM | POA: Diagnosis not present

## 2018-11-04 DIAGNOSIS — R262 Difficulty in walking, not elsewhere classified: Secondary | ICD-10-CM | POA: Diagnosis not present

## 2018-11-04 DIAGNOSIS — N179 Acute kidney failure, unspecified: Secondary | ICD-10-CM | POA: Diagnosis not present

## 2018-11-04 DIAGNOSIS — F0391 Unspecified dementia with behavioral disturbance: Secondary | ICD-10-CM | POA: Diagnosis not present

## 2018-11-04 DIAGNOSIS — M6281 Muscle weakness (generalized): Secondary | ICD-10-CM | POA: Diagnosis not present

## 2018-11-04 DIAGNOSIS — I352 Nonrheumatic aortic (valve) stenosis with insufficiency: Secondary | ICD-10-CM | POA: Diagnosis not present

## 2018-11-04 DIAGNOSIS — M1990 Unspecified osteoarthritis, unspecified site: Secondary | ICD-10-CM | POA: Diagnosis not present

## 2018-11-04 DIAGNOSIS — I2699 Other pulmonary embolism without acute cor pulmonale: Secondary | ICD-10-CM | POA: Diagnosis not present

## 2018-11-05 DIAGNOSIS — M6281 Muscle weakness (generalized): Secondary | ICD-10-CM | POA: Diagnosis not present

## 2018-11-05 DIAGNOSIS — I352 Nonrheumatic aortic (valve) stenosis with insufficiency: Secondary | ICD-10-CM | POA: Diagnosis not present

## 2018-11-05 DIAGNOSIS — R262 Difficulty in walking, not elsewhere classified: Secondary | ICD-10-CM | POA: Diagnosis not present

## 2018-11-05 DIAGNOSIS — M1990 Unspecified osteoarthritis, unspecified site: Secondary | ICD-10-CM | POA: Diagnosis not present

## 2018-11-05 DIAGNOSIS — N179 Acute kidney failure, unspecified: Secondary | ICD-10-CM | POA: Diagnosis not present

## 2018-11-05 DIAGNOSIS — F0391 Unspecified dementia with behavioral disturbance: Secondary | ICD-10-CM | POA: Diagnosis not present

## 2018-11-05 DIAGNOSIS — I2699 Other pulmonary embolism without acute cor pulmonale: Secondary | ICD-10-CM | POA: Diagnosis not present

## 2018-11-06 DIAGNOSIS — I352 Nonrheumatic aortic (valve) stenosis with insufficiency: Secondary | ICD-10-CM | POA: Diagnosis not present

## 2018-11-06 DIAGNOSIS — N179 Acute kidney failure, unspecified: Secondary | ICD-10-CM | POA: Diagnosis not present

## 2018-11-06 DIAGNOSIS — M6281 Muscle weakness (generalized): Secondary | ICD-10-CM | POA: Diagnosis not present

## 2018-11-06 DIAGNOSIS — F0391 Unspecified dementia with behavioral disturbance: Secondary | ICD-10-CM | POA: Diagnosis not present

## 2018-11-06 DIAGNOSIS — R262 Difficulty in walking, not elsewhere classified: Secondary | ICD-10-CM | POA: Diagnosis not present

## 2018-11-06 DIAGNOSIS — L89322 Pressure ulcer of left buttock, stage 2: Secondary | ICD-10-CM | POA: Diagnosis not present

## 2018-11-06 DIAGNOSIS — M1990 Unspecified osteoarthritis, unspecified site: Secondary | ICD-10-CM | POA: Diagnosis not present

## 2018-11-06 DIAGNOSIS — I2699 Other pulmonary embolism without acute cor pulmonale: Secondary | ICD-10-CM | POA: Diagnosis not present

## 2018-11-09 DIAGNOSIS — R262 Difficulty in walking, not elsewhere classified: Secondary | ICD-10-CM | POA: Diagnosis not present

## 2018-11-09 DIAGNOSIS — I2699 Other pulmonary embolism without acute cor pulmonale: Secondary | ICD-10-CM | POA: Diagnosis not present

## 2018-11-09 DIAGNOSIS — M6281 Muscle weakness (generalized): Secondary | ICD-10-CM | POA: Diagnosis not present

## 2018-11-09 DIAGNOSIS — F0391 Unspecified dementia with behavioral disturbance: Secondary | ICD-10-CM | POA: Diagnosis not present

## 2018-11-09 DIAGNOSIS — I352 Nonrheumatic aortic (valve) stenosis with insufficiency: Secondary | ICD-10-CM | POA: Diagnosis not present

## 2018-11-09 DIAGNOSIS — M1990 Unspecified osteoarthritis, unspecified site: Secondary | ICD-10-CM | POA: Diagnosis not present

## 2018-11-09 DIAGNOSIS — N179 Acute kidney failure, unspecified: Secondary | ICD-10-CM | POA: Diagnosis not present

## 2018-11-10 DIAGNOSIS — R262 Difficulty in walking, not elsewhere classified: Secondary | ICD-10-CM | POA: Diagnosis not present

## 2018-11-10 DIAGNOSIS — M1990 Unspecified osteoarthritis, unspecified site: Secondary | ICD-10-CM | POA: Diagnosis not present

## 2018-11-10 DIAGNOSIS — I352 Nonrheumatic aortic (valve) stenosis with insufficiency: Secondary | ICD-10-CM | POA: Diagnosis not present

## 2018-11-10 DIAGNOSIS — N179 Acute kidney failure, unspecified: Secondary | ICD-10-CM | POA: Diagnosis not present

## 2018-11-10 DIAGNOSIS — F0391 Unspecified dementia with behavioral disturbance: Secondary | ICD-10-CM | POA: Diagnosis not present

## 2018-11-10 DIAGNOSIS — M6281 Muscle weakness (generalized): Secondary | ICD-10-CM | POA: Diagnosis not present

## 2018-11-10 DIAGNOSIS — I2699 Other pulmonary embolism without acute cor pulmonale: Secondary | ICD-10-CM | POA: Diagnosis not present

## 2018-11-11 DIAGNOSIS — I352 Nonrheumatic aortic (valve) stenosis with insufficiency: Secondary | ICD-10-CM | POA: Diagnosis not present

## 2018-11-11 DIAGNOSIS — M6281 Muscle weakness (generalized): Secondary | ICD-10-CM | POA: Diagnosis not present

## 2018-11-11 DIAGNOSIS — N179 Acute kidney failure, unspecified: Secondary | ICD-10-CM | POA: Diagnosis not present

## 2018-11-11 DIAGNOSIS — R262 Difficulty in walking, not elsewhere classified: Secondary | ICD-10-CM | POA: Diagnosis not present

## 2018-11-11 DIAGNOSIS — F0391 Unspecified dementia with behavioral disturbance: Secondary | ICD-10-CM | POA: Diagnosis not present

## 2018-11-11 DIAGNOSIS — M1990 Unspecified osteoarthritis, unspecified site: Secondary | ICD-10-CM | POA: Diagnosis not present

## 2018-11-11 DIAGNOSIS — I2699 Other pulmonary embolism without acute cor pulmonale: Secondary | ICD-10-CM | POA: Diagnosis not present

## 2018-11-12 DIAGNOSIS — R262 Difficulty in walking, not elsewhere classified: Secondary | ICD-10-CM | POA: Diagnosis not present

## 2018-11-12 DIAGNOSIS — M6281 Muscle weakness (generalized): Secondary | ICD-10-CM | POA: Diagnosis not present

## 2018-11-12 DIAGNOSIS — I352 Nonrheumatic aortic (valve) stenosis with insufficiency: Secondary | ICD-10-CM | POA: Diagnosis not present

## 2018-11-12 DIAGNOSIS — I2699 Other pulmonary embolism without acute cor pulmonale: Secondary | ICD-10-CM | POA: Diagnosis not present

## 2018-11-12 DIAGNOSIS — F0391 Unspecified dementia with behavioral disturbance: Secondary | ICD-10-CM | POA: Diagnosis not present

## 2018-11-12 DIAGNOSIS — M1990 Unspecified osteoarthritis, unspecified site: Secondary | ICD-10-CM | POA: Diagnosis not present

## 2018-11-12 DIAGNOSIS — N179 Acute kidney failure, unspecified: Secondary | ICD-10-CM | POA: Diagnosis not present

## 2018-11-13 DIAGNOSIS — I352 Nonrheumatic aortic (valve) stenosis with insufficiency: Secondary | ICD-10-CM | POA: Diagnosis not present

## 2018-11-13 DIAGNOSIS — I2699 Other pulmonary embolism without acute cor pulmonale: Secondary | ICD-10-CM | POA: Diagnosis not present

## 2018-11-13 DIAGNOSIS — R262 Difficulty in walking, not elsewhere classified: Secondary | ICD-10-CM | POA: Diagnosis not present

## 2018-11-13 DIAGNOSIS — F0391 Unspecified dementia with behavioral disturbance: Secondary | ICD-10-CM | POA: Diagnosis not present

## 2018-11-13 DIAGNOSIS — N179 Acute kidney failure, unspecified: Secondary | ICD-10-CM | POA: Diagnosis not present

## 2018-11-13 DIAGNOSIS — M6281 Muscle weakness (generalized): Secondary | ICD-10-CM | POA: Diagnosis not present

## 2018-11-13 DIAGNOSIS — M1990 Unspecified osteoarthritis, unspecified site: Secondary | ICD-10-CM | POA: Diagnosis not present

## 2018-11-16 DIAGNOSIS — M1990 Unspecified osteoarthritis, unspecified site: Secondary | ICD-10-CM | POA: Diagnosis not present

## 2018-11-16 DIAGNOSIS — N179 Acute kidney failure, unspecified: Secondary | ICD-10-CM | POA: Diagnosis not present

## 2018-11-16 DIAGNOSIS — R1311 Dysphagia, oral phase: Secondary | ICD-10-CM | POA: Diagnosis not present

## 2018-11-16 DIAGNOSIS — M6281 Muscle weakness (generalized): Secondary | ICD-10-CM | POA: Diagnosis not present

## 2018-11-16 DIAGNOSIS — I2699 Other pulmonary embolism without acute cor pulmonale: Secondary | ICD-10-CM | POA: Diagnosis not present

## 2018-11-16 DIAGNOSIS — F0391 Unspecified dementia with behavioral disturbance: Secondary | ICD-10-CM | POA: Diagnosis not present

## 2018-11-16 DIAGNOSIS — R262 Difficulty in walking, not elsewhere classified: Secondary | ICD-10-CM | POA: Diagnosis not present

## 2018-11-16 DIAGNOSIS — Z9181 History of falling: Secondary | ICD-10-CM | POA: Diagnosis not present

## 2018-11-16 DIAGNOSIS — I352 Nonrheumatic aortic (valve) stenosis with insufficiency: Secondary | ICD-10-CM | POA: Diagnosis not present

## 2018-11-17 DIAGNOSIS — M6281 Muscle weakness (generalized): Secondary | ICD-10-CM | POA: Diagnosis not present

## 2018-11-17 DIAGNOSIS — N179 Acute kidney failure, unspecified: Secondary | ICD-10-CM | POA: Diagnosis not present

## 2018-11-17 DIAGNOSIS — R262 Difficulty in walking, not elsewhere classified: Secondary | ICD-10-CM | POA: Diagnosis not present

## 2018-11-17 DIAGNOSIS — Z9181 History of falling: Secondary | ICD-10-CM | POA: Diagnosis not present

## 2018-11-17 DIAGNOSIS — M1990 Unspecified osteoarthritis, unspecified site: Secondary | ICD-10-CM | POA: Diagnosis not present

## 2018-11-17 DIAGNOSIS — F0391 Unspecified dementia with behavioral disturbance: Secondary | ICD-10-CM | POA: Diagnosis not present

## 2018-11-17 DIAGNOSIS — I2699 Other pulmonary embolism without acute cor pulmonale: Secondary | ICD-10-CM | POA: Diagnosis not present

## 2018-11-17 DIAGNOSIS — Z20828 Contact with and (suspected) exposure to other viral communicable diseases: Secondary | ICD-10-CM | POA: Diagnosis not present

## 2018-11-17 DIAGNOSIS — R1311 Dysphagia, oral phase: Secondary | ICD-10-CM | POA: Diagnosis not present

## 2018-11-17 DIAGNOSIS — I352 Nonrheumatic aortic (valve) stenosis with insufficiency: Secondary | ICD-10-CM | POA: Diagnosis not present

## 2018-11-18 DIAGNOSIS — R1311 Dysphagia, oral phase: Secondary | ICD-10-CM | POA: Diagnosis not present

## 2018-11-18 DIAGNOSIS — Z9181 History of falling: Secondary | ICD-10-CM | POA: Diagnosis not present

## 2018-11-18 DIAGNOSIS — I352 Nonrheumatic aortic (valve) stenosis with insufficiency: Secondary | ICD-10-CM | POA: Diagnosis not present

## 2018-11-18 DIAGNOSIS — M1990 Unspecified osteoarthritis, unspecified site: Secondary | ICD-10-CM | POA: Diagnosis not present

## 2018-11-18 DIAGNOSIS — F0391 Unspecified dementia with behavioral disturbance: Secondary | ICD-10-CM | POA: Diagnosis not present

## 2018-11-18 DIAGNOSIS — N179 Acute kidney failure, unspecified: Secondary | ICD-10-CM | POA: Diagnosis not present

## 2018-11-18 DIAGNOSIS — R262 Difficulty in walking, not elsewhere classified: Secondary | ICD-10-CM | POA: Diagnosis not present

## 2018-11-18 DIAGNOSIS — I2699 Other pulmonary embolism without acute cor pulmonale: Secondary | ICD-10-CM | POA: Diagnosis not present

## 2018-11-18 DIAGNOSIS — M6281 Muscle weakness (generalized): Secondary | ICD-10-CM | POA: Diagnosis not present

## 2018-11-19 DIAGNOSIS — I352 Nonrheumatic aortic (valve) stenosis with insufficiency: Secondary | ICD-10-CM | POA: Diagnosis not present

## 2018-11-19 DIAGNOSIS — R262 Difficulty in walking, not elsewhere classified: Secondary | ICD-10-CM | POA: Diagnosis not present

## 2018-11-19 DIAGNOSIS — M1990 Unspecified osteoarthritis, unspecified site: Secondary | ICD-10-CM | POA: Diagnosis not present

## 2018-11-19 DIAGNOSIS — I2699 Other pulmonary embolism without acute cor pulmonale: Secondary | ICD-10-CM | POA: Diagnosis not present

## 2018-11-19 DIAGNOSIS — N179 Acute kidney failure, unspecified: Secondary | ICD-10-CM | POA: Diagnosis not present

## 2018-11-19 DIAGNOSIS — R1311 Dysphagia, oral phase: Secondary | ICD-10-CM | POA: Diagnosis not present

## 2018-11-19 DIAGNOSIS — Z9181 History of falling: Secondary | ICD-10-CM | POA: Diagnosis not present

## 2018-11-19 DIAGNOSIS — F0391 Unspecified dementia with behavioral disturbance: Secondary | ICD-10-CM | POA: Diagnosis not present

## 2018-11-19 DIAGNOSIS — M6281 Muscle weakness (generalized): Secondary | ICD-10-CM | POA: Diagnosis not present

## 2018-11-20 DIAGNOSIS — R262 Difficulty in walking, not elsewhere classified: Secondary | ICD-10-CM | POA: Diagnosis not present

## 2018-11-20 DIAGNOSIS — M1990 Unspecified osteoarthritis, unspecified site: Secondary | ICD-10-CM | POA: Diagnosis not present

## 2018-11-20 DIAGNOSIS — I2699 Other pulmonary embolism without acute cor pulmonale: Secondary | ICD-10-CM | POA: Diagnosis not present

## 2018-11-20 DIAGNOSIS — I352 Nonrheumatic aortic (valve) stenosis with insufficiency: Secondary | ICD-10-CM | POA: Diagnosis not present

## 2018-11-20 DIAGNOSIS — R1311 Dysphagia, oral phase: Secondary | ICD-10-CM | POA: Diagnosis not present

## 2018-11-20 DIAGNOSIS — N179 Acute kidney failure, unspecified: Secondary | ICD-10-CM | POA: Diagnosis not present

## 2018-11-20 DIAGNOSIS — Z9181 History of falling: Secondary | ICD-10-CM | POA: Diagnosis not present

## 2018-11-20 DIAGNOSIS — F0391 Unspecified dementia with behavioral disturbance: Secondary | ICD-10-CM | POA: Diagnosis not present

## 2018-11-20 DIAGNOSIS — M6281 Muscle weakness (generalized): Secondary | ICD-10-CM | POA: Diagnosis not present

## 2018-11-22 DIAGNOSIS — M6281 Muscle weakness (generalized): Secondary | ICD-10-CM | POA: Diagnosis not present

## 2018-11-22 DIAGNOSIS — R262 Difficulty in walking, not elsewhere classified: Secondary | ICD-10-CM | POA: Diagnosis not present

## 2018-11-22 DIAGNOSIS — F0391 Unspecified dementia with behavioral disturbance: Secondary | ICD-10-CM | POA: Diagnosis not present

## 2018-11-22 DIAGNOSIS — I2699 Other pulmonary embolism without acute cor pulmonale: Secondary | ICD-10-CM | POA: Diagnosis not present

## 2018-11-22 DIAGNOSIS — M1990 Unspecified osteoarthritis, unspecified site: Secondary | ICD-10-CM | POA: Diagnosis not present

## 2018-11-22 DIAGNOSIS — Z9181 History of falling: Secondary | ICD-10-CM | POA: Diagnosis not present

## 2018-11-22 DIAGNOSIS — N179 Acute kidney failure, unspecified: Secondary | ICD-10-CM | POA: Diagnosis not present

## 2018-11-22 DIAGNOSIS — R1311 Dysphagia, oral phase: Secondary | ICD-10-CM | POA: Diagnosis not present

## 2018-11-22 DIAGNOSIS — I352 Nonrheumatic aortic (valve) stenosis with insufficiency: Secondary | ICD-10-CM | POA: Diagnosis not present

## 2018-11-23 DIAGNOSIS — M6281 Muscle weakness (generalized): Secondary | ICD-10-CM | POA: Diagnosis not present

## 2018-11-23 DIAGNOSIS — F0391 Unspecified dementia with behavioral disturbance: Secondary | ICD-10-CM | POA: Diagnosis not present

## 2018-11-23 DIAGNOSIS — N179 Acute kidney failure, unspecified: Secondary | ICD-10-CM | POA: Diagnosis not present

## 2018-11-23 DIAGNOSIS — M1990 Unspecified osteoarthritis, unspecified site: Secondary | ICD-10-CM | POA: Diagnosis not present

## 2018-11-23 DIAGNOSIS — I352 Nonrheumatic aortic (valve) stenosis with insufficiency: Secondary | ICD-10-CM | POA: Diagnosis not present

## 2018-11-23 DIAGNOSIS — R262 Difficulty in walking, not elsewhere classified: Secondary | ICD-10-CM | POA: Diagnosis not present

## 2018-11-23 DIAGNOSIS — Z9181 History of falling: Secondary | ICD-10-CM | POA: Diagnosis not present

## 2018-11-23 DIAGNOSIS — I2699 Other pulmonary embolism without acute cor pulmonale: Secondary | ICD-10-CM | POA: Diagnosis not present

## 2018-11-23 DIAGNOSIS — R1311 Dysphagia, oral phase: Secondary | ICD-10-CM | POA: Diagnosis not present

## 2018-11-24 DIAGNOSIS — N179 Acute kidney failure, unspecified: Secondary | ICD-10-CM | POA: Diagnosis not present

## 2018-11-24 DIAGNOSIS — I2699 Other pulmonary embolism without acute cor pulmonale: Secondary | ICD-10-CM | POA: Diagnosis not present

## 2018-11-24 DIAGNOSIS — R262 Difficulty in walking, not elsewhere classified: Secondary | ICD-10-CM | POA: Diagnosis not present

## 2018-11-24 DIAGNOSIS — F0391 Unspecified dementia with behavioral disturbance: Secondary | ICD-10-CM | POA: Diagnosis not present

## 2018-11-24 DIAGNOSIS — Z9181 History of falling: Secondary | ICD-10-CM | POA: Diagnosis not present

## 2018-11-24 DIAGNOSIS — I352 Nonrheumatic aortic (valve) stenosis with insufficiency: Secondary | ICD-10-CM | POA: Diagnosis not present

## 2018-11-24 DIAGNOSIS — M1990 Unspecified osteoarthritis, unspecified site: Secondary | ICD-10-CM | POA: Diagnosis not present

## 2018-11-24 DIAGNOSIS — M6281 Muscle weakness (generalized): Secondary | ICD-10-CM | POA: Diagnosis not present

## 2018-11-24 DIAGNOSIS — R1311 Dysphagia, oral phase: Secondary | ICD-10-CM | POA: Diagnosis not present

## 2018-11-25 DIAGNOSIS — M6281 Muscle weakness (generalized): Secondary | ICD-10-CM | POA: Diagnosis not present

## 2018-11-25 DIAGNOSIS — N179 Acute kidney failure, unspecified: Secondary | ICD-10-CM | POA: Diagnosis not present

## 2018-11-25 DIAGNOSIS — F0391 Unspecified dementia with behavioral disturbance: Secondary | ICD-10-CM | POA: Diagnosis not present

## 2018-11-25 DIAGNOSIS — Z9181 History of falling: Secondary | ICD-10-CM | POA: Diagnosis not present

## 2018-11-25 DIAGNOSIS — I2699 Other pulmonary embolism without acute cor pulmonale: Secondary | ICD-10-CM | POA: Diagnosis not present

## 2018-11-25 DIAGNOSIS — R262 Difficulty in walking, not elsewhere classified: Secondary | ICD-10-CM | POA: Diagnosis not present

## 2018-11-25 DIAGNOSIS — R1311 Dysphagia, oral phase: Secondary | ICD-10-CM | POA: Diagnosis not present

## 2018-11-25 DIAGNOSIS — M1990 Unspecified osteoarthritis, unspecified site: Secondary | ICD-10-CM | POA: Diagnosis not present

## 2018-11-25 DIAGNOSIS — I352 Nonrheumatic aortic (valve) stenosis with insufficiency: Secondary | ICD-10-CM | POA: Diagnosis not present

## 2018-11-26 DIAGNOSIS — I2699 Other pulmonary embolism without acute cor pulmonale: Secondary | ICD-10-CM | POA: Diagnosis not present

## 2018-11-26 DIAGNOSIS — M6281 Muscle weakness (generalized): Secondary | ICD-10-CM | POA: Diagnosis not present

## 2018-11-26 DIAGNOSIS — M1990 Unspecified osteoarthritis, unspecified site: Secondary | ICD-10-CM | POA: Diagnosis not present

## 2018-11-26 DIAGNOSIS — R262 Difficulty in walking, not elsewhere classified: Secondary | ICD-10-CM | POA: Diagnosis not present

## 2018-11-26 DIAGNOSIS — R0989 Other specified symptoms and signs involving the circulatory and respiratory systems: Secondary | ICD-10-CM | POA: Diagnosis not present

## 2018-11-26 DIAGNOSIS — Z9181 History of falling: Secondary | ICD-10-CM | POA: Diagnosis not present

## 2018-11-26 DIAGNOSIS — I10 Essential (primary) hypertension: Secondary | ICD-10-CM | POA: Diagnosis not present

## 2018-11-26 DIAGNOSIS — R1311 Dysphagia, oral phase: Secondary | ICD-10-CM | POA: Diagnosis not present

## 2018-11-26 DIAGNOSIS — N179 Acute kidney failure, unspecified: Secondary | ICD-10-CM | POA: Diagnosis not present

## 2018-11-26 DIAGNOSIS — F0391 Unspecified dementia with behavioral disturbance: Secondary | ICD-10-CM | POA: Diagnosis not present

## 2018-11-26 DIAGNOSIS — I352 Nonrheumatic aortic (valve) stenosis with insufficiency: Secondary | ICD-10-CM | POA: Diagnosis not present

## 2018-11-26 DIAGNOSIS — D649 Anemia, unspecified: Secondary | ICD-10-CM | POA: Diagnosis not present

## 2018-11-27 DIAGNOSIS — R0989 Other specified symptoms and signs involving the circulatory and respiratory systems: Secondary | ICD-10-CM | POA: Diagnosis not present

## 2018-11-27 DIAGNOSIS — F331 Major depressive disorder, recurrent, moderate: Secondary | ICD-10-CM | POA: Diagnosis not present

## 2018-11-27 DIAGNOSIS — R05 Cough: Secondary | ICD-10-CM | POA: Diagnosis not present

## 2018-11-30 DIAGNOSIS — I2699 Other pulmonary embolism without acute cor pulmonale: Secondary | ICD-10-CM | POA: Diagnosis not present

## 2018-11-30 DIAGNOSIS — I352 Nonrheumatic aortic (valve) stenosis with insufficiency: Secondary | ICD-10-CM | POA: Diagnosis not present

## 2018-11-30 DIAGNOSIS — R1311 Dysphagia, oral phase: Secondary | ICD-10-CM | POA: Diagnosis not present

## 2018-11-30 DIAGNOSIS — Z9181 History of falling: Secondary | ICD-10-CM | POA: Diagnosis not present

## 2018-11-30 DIAGNOSIS — N179 Acute kidney failure, unspecified: Secondary | ICD-10-CM | POA: Diagnosis not present

## 2018-11-30 DIAGNOSIS — M6281 Muscle weakness (generalized): Secondary | ICD-10-CM | POA: Diagnosis not present

## 2018-11-30 DIAGNOSIS — M1990 Unspecified osteoarthritis, unspecified site: Secondary | ICD-10-CM | POA: Diagnosis not present

## 2018-11-30 DIAGNOSIS — R262 Difficulty in walking, not elsewhere classified: Secondary | ICD-10-CM | POA: Diagnosis not present

## 2018-11-30 DIAGNOSIS — F0391 Unspecified dementia with behavioral disturbance: Secondary | ICD-10-CM | POA: Diagnosis not present

## 2018-12-01 DIAGNOSIS — M1990 Unspecified osteoarthritis, unspecified site: Secondary | ICD-10-CM | POA: Diagnosis not present

## 2018-12-01 DIAGNOSIS — F0391 Unspecified dementia with behavioral disturbance: Secondary | ICD-10-CM | POA: Diagnosis not present

## 2018-12-01 DIAGNOSIS — R262 Difficulty in walking, not elsewhere classified: Secondary | ICD-10-CM | POA: Diagnosis not present

## 2018-12-01 DIAGNOSIS — N179 Acute kidney failure, unspecified: Secondary | ICD-10-CM | POA: Diagnosis not present

## 2018-12-01 DIAGNOSIS — I2699 Other pulmonary embolism without acute cor pulmonale: Secondary | ICD-10-CM | POA: Diagnosis not present

## 2018-12-01 DIAGNOSIS — Z9181 History of falling: Secondary | ICD-10-CM | POA: Diagnosis not present

## 2018-12-01 DIAGNOSIS — R1311 Dysphagia, oral phase: Secondary | ICD-10-CM | POA: Diagnosis not present

## 2018-12-01 DIAGNOSIS — I352 Nonrheumatic aortic (valve) stenosis with insufficiency: Secondary | ICD-10-CM | POA: Diagnosis not present

## 2018-12-01 DIAGNOSIS — M6281 Muscle weakness (generalized): Secondary | ICD-10-CM | POA: Diagnosis not present

## 2018-12-02 DIAGNOSIS — I2699 Other pulmonary embolism without acute cor pulmonale: Secondary | ICD-10-CM | POA: Diagnosis not present

## 2018-12-02 DIAGNOSIS — F0391 Unspecified dementia with behavioral disturbance: Secondary | ICD-10-CM | POA: Diagnosis not present

## 2018-12-02 DIAGNOSIS — M1990 Unspecified osteoarthritis, unspecified site: Secondary | ICD-10-CM | POA: Diagnosis not present

## 2018-12-02 DIAGNOSIS — R262 Difficulty in walking, not elsewhere classified: Secondary | ICD-10-CM | POA: Diagnosis not present

## 2018-12-02 DIAGNOSIS — R1311 Dysphagia, oral phase: Secondary | ICD-10-CM | POA: Diagnosis not present

## 2018-12-02 DIAGNOSIS — I352 Nonrheumatic aortic (valve) stenosis with insufficiency: Secondary | ICD-10-CM | POA: Diagnosis not present

## 2018-12-02 DIAGNOSIS — Z9181 History of falling: Secondary | ICD-10-CM | POA: Diagnosis not present

## 2018-12-02 DIAGNOSIS — N179 Acute kidney failure, unspecified: Secondary | ICD-10-CM | POA: Diagnosis not present

## 2018-12-02 DIAGNOSIS — M6281 Muscle weakness (generalized): Secondary | ICD-10-CM | POA: Diagnosis not present

## 2018-12-03 DIAGNOSIS — I352 Nonrheumatic aortic (valve) stenosis with insufficiency: Secondary | ICD-10-CM | POA: Diagnosis not present

## 2018-12-03 DIAGNOSIS — N179 Acute kidney failure, unspecified: Secondary | ICD-10-CM | POA: Diagnosis not present

## 2018-12-03 DIAGNOSIS — N390 Urinary tract infection, site not specified: Secondary | ICD-10-CM | POA: Diagnosis not present

## 2018-12-03 DIAGNOSIS — Z9181 History of falling: Secondary | ICD-10-CM | POA: Diagnosis not present

## 2018-12-03 DIAGNOSIS — F0391 Unspecified dementia with behavioral disturbance: Secondary | ICD-10-CM | POA: Diagnosis not present

## 2018-12-03 DIAGNOSIS — I2699 Other pulmonary embolism without acute cor pulmonale: Secondary | ICD-10-CM | POA: Diagnosis not present

## 2018-12-03 DIAGNOSIS — M6281 Muscle weakness (generalized): Secondary | ICD-10-CM | POA: Diagnosis not present

## 2018-12-03 DIAGNOSIS — R319 Hematuria, unspecified: Secondary | ICD-10-CM | POA: Diagnosis not present

## 2018-12-03 DIAGNOSIS — M1990 Unspecified osteoarthritis, unspecified site: Secondary | ICD-10-CM | POA: Diagnosis not present

## 2018-12-03 DIAGNOSIS — R1311 Dysphagia, oral phase: Secondary | ICD-10-CM | POA: Diagnosis not present

## 2018-12-03 DIAGNOSIS — R6889 Other general symptoms and signs: Secondary | ICD-10-CM | POA: Diagnosis not present

## 2018-12-03 DIAGNOSIS — R262 Difficulty in walking, not elsewhere classified: Secondary | ICD-10-CM | POA: Diagnosis not present

## 2018-12-04 DIAGNOSIS — R262 Difficulty in walking, not elsewhere classified: Secondary | ICD-10-CM | POA: Diagnosis not present

## 2018-12-04 DIAGNOSIS — I352 Nonrheumatic aortic (valve) stenosis with insufficiency: Secondary | ICD-10-CM | POA: Diagnosis not present

## 2018-12-04 DIAGNOSIS — M1990 Unspecified osteoarthritis, unspecified site: Secondary | ICD-10-CM | POA: Diagnosis not present

## 2018-12-04 DIAGNOSIS — F0391 Unspecified dementia with behavioral disturbance: Secondary | ICD-10-CM | POA: Diagnosis not present

## 2018-12-04 DIAGNOSIS — M6281 Muscle weakness (generalized): Secondary | ICD-10-CM | POA: Diagnosis not present

## 2018-12-04 DIAGNOSIS — Z9181 History of falling: Secondary | ICD-10-CM | POA: Diagnosis not present

## 2018-12-04 DIAGNOSIS — N179 Acute kidney failure, unspecified: Secondary | ICD-10-CM | POA: Diagnosis not present

## 2018-12-04 DIAGNOSIS — I2699 Other pulmonary embolism without acute cor pulmonale: Secondary | ICD-10-CM | POA: Diagnosis not present

## 2018-12-04 DIAGNOSIS — R1311 Dysphagia, oral phase: Secondary | ICD-10-CM | POA: Diagnosis not present

## 2018-12-07 DIAGNOSIS — I352 Nonrheumatic aortic (valve) stenosis with insufficiency: Secondary | ICD-10-CM | POA: Diagnosis not present

## 2018-12-07 DIAGNOSIS — I2699 Other pulmonary embolism without acute cor pulmonale: Secondary | ICD-10-CM | POA: Diagnosis not present

## 2018-12-07 DIAGNOSIS — M1990 Unspecified osteoarthritis, unspecified site: Secondary | ICD-10-CM | POA: Diagnosis not present

## 2018-12-07 DIAGNOSIS — N179 Acute kidney failure, unspecified: Secondary | ICD-10-CM | POA: Diagnosis not present

## 2018-12-07 DIAGNOSIS — F0391 Unspecified dementia with behavioral disturbance: Secondary | ICD-10-CM | POA: Diagnosis not present

## 2018-12-07 DIAGNOSIS — R1311 Dysphagia, oral phase: Secondary | ICD-10-CM | POA: Diagnosis not present

## 2018-12-07 DIAGNOSIS — M6281 Muscle weakness (generalized): Secondary | ICD-10-CM | POA: Diagnosis not present

## 2018-12-07 DIAGNOSIS — R262 Difficulty in walking, not elsewhere classified: Secondary | ICD-10-CM | POA: Diagnosis not present

## 2018-12-07 DIAGNOSIS — Z9181 History of falling: Secondary | ICD-10-CM | POA: Diagnosis not present

## 2018-12-08 DIAGNOSIS — M1990 Unspecified osteoarthritis, unspecified site: Secondary | ICD-10-CM | POA: Diagnosis not present

## 2018-12-08 DIAGNOSIS — R262 Difficulty in walking, not elsewhere classified: Secondary | ICD-10-CM | POA: Diagnosis not present

## 2018-12-08 DIAGNOSIS — I2699 Other pulmonary embolism without acute cor pulmonale: Secondary | ICD-10-CM | POA: Diagnosis not present

## 2018-12-08 DIAGNOSIS — F0391 Unspecified dementia with behavioral disturbance: Secondary | ICD-10-CM | POA: Diagnosis not present

## 2018-12-08 DIAGNOSIS — N179 Acute kidney failure, unspecified: Secondary | ICD-10-CM | POA: Diagnosis not present

## 2018-12-08 DIAGNOSIS — Z9181 History of falling: Secondary | ICD-10-CM | POA: Diagnosis not present

## 2018-12-08 DIAGNOSIS — I352 Nonrheumatic aortic (valve) stenosis with insufficiency: Secondary | ICD-10-CM | POA: Diagnosis not present

## 2018-12-08 DIAGNOSIS — M6281 Muscle weakness (generalized): Secondary | ICD-10-CM | POA: Diagnosis not present

## 2018-12-08 DIAGNOSIS — R1311 Dysphagia, oral phase: Secondary | ICD-10-CM | POA: Diagnosis not present

## 2018-12-09 DIAGNOSIS — I2699 Other pulmonary embolism without acute cor pulmonale: Secondary | ICD-10-CM | POA: Diagnosis not present

## 2018-12-09 DIAGNOSIS — N179 Acute kidney failure, unspecified: Secondary | ICD-10-CM | POA: Diagnosis not present

## 2018-12-09 DIAGNOSIS — M6281 Muscle weakness (generalized): Secondary | ICD-10-CM | POA: Diagnosis not present

## 2018-12-09 DIAGNOSIS — R262 Difficulty in walking, not elsewhere classified: Secondary | ICD-10-CM | POA: Diagnosis not present

## 2018-12-09 DIAGNOSIS — M1990 Unspecified osteoarthritis, unspecified site: Secondary | ICD-10-CM | POA: Diagnosis not present

## 2018-12-09 DIAGNOSIS — I352 Nonrheumatic aortic (valve) stenosis with insufficiency: Secondary | ICD-10-CM | POA: Diagnosis not present

## 2018-12-09 DIAGNOSIS — Z9181 History of falling: Secondary | ICD-10-CM | POA: Diagnosis not present

## 2018-12-09 DIAGNOSIS — F0391 Unspecified dementia with behavioral disturbance: Secondary | ICD-10-CM | POA: Diagnosis not present

## 2018-12-09 DIAGNOSIS — R1311 Dysphagia, oral phase: Secondary | ICD-10-CM | POA: Diagnosis not present

## 2018-12-10 DIAGNOSIS — F0391 Unspecified dementia with behavioral disturbance: Secondary | ICD-10-CM | POA: Diagnosis not present

## 2018-12-10 DIAGNOSIS — R262 Difficulty in walking, not elsewhere classified: Secondary | ICD-10-CM | POA: Diagnosis not present

## 2018-12-10 DIAGNOSIS — M1990 Unspecified osteoarthritis, unspecified site: Secondary | ICD-10-CM | POA: Diagnosis not present

## 2018-12-10 DIAGNOSIS — I352 Nonrheumatic aortic (valve) stenosis with insufficiency: Secondary | ICD-10-CM | POA: Diagnosis not present

## 2018-12-10 DIAGNOSIS — N179 Acute kidney failure, unspecified: Secondary | ICD-10-CM | POA: Diagnosis not present

## 2018-12-10 DIAGNOSIS — M6281 Muscle weakness (generalized): Secondary | ICD-10-CM | POA: Diagnosis not present

## 2018-12-10 DIAGNOSIS — Z9181 History of falling: Secondary | ICD-10-CM | POA: Diagnosis not present

## 2018-12-10 DIAGNOSIS — I2699 Other pulmonary embolism without acute cor pulmonale: Secondary | ICD-10-CM | POA: Diagnosis not present

## 2018-12-10 DIAGNOSIS — R1311 Dysphagia, oral phase: Secondary | ICD-10-CM | POA: Diagnosis not present

## 2018-12-11 DIAGNOSIS — R1311 Dysphagia, oral phase: Secondary | ICD-10-CM | POA: Diagnosis not present

## 2018-12-11 DIAGNOSIS — M1990 Unspecified osteoarthritis, unspecified site: Secondary | ICD-10-CM | POA: Diagnosis not present

## 2018-12-11 DIAGNOSIS — Z9181 History of falling: Secondary | ICD-10-CM | POA: Diagnosis not present

## 2018-12-11 DIAGNOSIS — N179 Acute kidney failure, unspecified: Secondary | ICD-10-CM | POA: Diagnosis not present

## 2018-12-11 DIAGNOSIS — M6281 Muscle weakness (generalized): Secondary | ICD-10-CM | POA: Diagnosis not present

## 2018-12-11 DIAGNOSIS — I352 Nonrheumatic aortic (valve) stenosis with insufficiency: Secondary | ICD-10-CM | POA: Diagnosis not present

## 2018-12-11 DIAGNOSIS — F0391 Unspecified dementia with behavioral disturbance: Secondary | ICD-10-CM | POA: Diagnosis not present

## 2018-12-11 DIAGNOSIS — I2699 Other pulmonary embolism without acute cor pulmonale: Secondary | ICD-10-CM | POA: Diagnosis not present

## 2018-12-11 DIAGNOSIS — R262 Difficulty in walking, not elsewhere classified: Secondary | ICD-10-CM | POA: Diagnosis not present

## 2018-12-14 DIAGNOSIS — N179 Acute kidney failure, unspecified: Secondary | ICD-10-CM | POA: Diagnosis not present

## 2018-12-14 DIAGNOSIS — M1990 Unspecified osteoarthritis, unspecified site: Secondary | ICD-10-CM | POA: Diagnosis not present

## 2018-12-14 DIAGNOSIS — Z9181 History of falling: Secondary | ICD-10-CM | POA: Diagnosis not present

## 2018-12-14 DIAGNOSIS — I352 Nonrheumatic aortic (valve) stenosis with insufficiency: Secondary | ICD-10-CM | POA: Diagnosis not present

## 2018-12-14 DIAGNOSIS — R1311 Dysphagia, oral phase: Secondary | ICD-10-CM | POA: Diagnosis not present

## 2018-12-14 DIAGNOSIS — M6281 Muscle weakness (generalized): Secondary | ICD-10-CM | POA: Diagnosis not present

## 2018-12-14 DIAGNOSIS — F0391 Unspecified dementia with behavioral disturbance: Secondary | ICD-10-CM | POA: Diagnosis not present

## 2018-12-14 DIAGNOSIS — I2699 Other pulmonary embolism without acute cor pulmonale: Secondary | ICD-10-CM | POA: Diagnosis not present

## 2018-12-14 DIAGNOSIS — R262 Difficulty in walking, not elsewhere classified: Secondary | ICD-10-CM | POA: Diagnosis not present

## 2018-12-15 DIAGNOSIS — R1311 Dysphagia, oral phase: Secondary | ICD-10-CM | POA: Diagnosis not present

## 2018-12-15 DIAGNOSIS — I352 Nonrheumatic aortic (valve) stenosis with insufficiency: Secondary | ICD-10-CM | POA: Diagnosis not present

## 2018-12-15 DIAGNOSIS — R262 Difficulty in walking, not elsewhere classified: Secondary | ICD-10-CM | POA: Diagnosis not present

## 2018-12-15 DIAGNOSIS — M1990 Unspecified osteoarthritis, unspecified site: Secondary | ICD-10-CM | POA: Diagnosis not present

## 2018-12-15 DIAGNOSIS — I2699 Other pulmonary embolism without acute cor pulmonale: Secondary | ICD-10-CM | POA: Diagnosis not present

## 2018-12-15 DIAGNOSIS — N179 Acute kidney failure, unspecified: Secondary | ICD-10-CM | POA: Diagnosis not present

## 2018-12-15 DIAGNOSIS — F0391 Unspecified dementia with behavioral disturbance: Secondary | ICD-10-CM | POA: Diagnosis not present

## 2018-12-15 DIAGNOSIS — M6281 Muscle weakness (generalized): Secondary | ICD-10-CM | POA: Diagnosis not present

## 2018-12-15 DIAGNOSIS — Z9181 History of falling: Secondary | ICD-10-CM | POA: Diagnosis not present

## 2018-12-16 DIAGNOSIS — F0391 Unspecified dementia with behavioral disturbance: Secondary | ICD-10-CM | POA: Diagnosis not present

## 2018-12-16 DIAGNOSIS — Z9181 History of falling: Secondary | ICD-10-CM | POA: Diagnosis not present

## 2018-12-16 DIAGNOSIS — M6281 Muscle weakness (generalized): Secondary | ICD-10-CM | POA: Diagnosis not present

## 2018-12-16 DIAGNOSIS — I352 Nonrheumatic aortic (valve) stenosis with insufficiency: Secondary | ICD-10-CM | POA: Diagnosis not present

## 2018-12-16 DIAGNOSIS — M1990 Unspecified osteoarthritis, unspecified site: Secondary | ICD-10-CM | POA: Diagnosis not present

## 2018-12-16 DIAGNOSIS — R1311 Dysphagia, oral phase: Secondary | ICD-10-CM | POA: Diagnosis not present

## 2018-12-16 DIAGNOSIS — N179 Acute kidney failure, unspecified: Secondary | ICD-10-CM | POA: Diagnosis not present

## 2018-12-16 DIAGNOSIS — R262 Difficulty in walking, not elsewhere classified: Secondary | ICD-10-CM | POA: Diagnosis not present

## 2018-12-16 DIAGNOSIS — I2699 Other pulmonary embolism without acute cor pulmonale: Secondary | ICD-10-CM | POA: Diagnosis not present

## 2018-12-17 DIAGNOSIS — F0391 Unspecified dementia with behavioral disturbance: Secondary | ICD-10-CM | POA: Diagnosis not present

## 2018-12-17 DIAGNOSIS — N179 Acute kidney failure, unspecified: Secondary | ICD-10-CM | POA: Diagnosis not present

## 2018-12-17 DIAGNOSIS — R1311 Dysphagia, oral phase: Secondary | ICD-10-CM | POA: Diagnosis not present

## 2018-12-17 DIAGNOSIS — Z9181 History of falling: Secondary | ICD-10-CM | POA: Diagnosis not present

## 2018-12-17 DIAGNOSIS — R262 Difficulty in walking, not elsewhere classified: Secondary | ICD-10-CM | POA: Diagnosis not present

## 2018-12-17 DIAGNOSIS — I352 Nonrheumatic aortic (valve) stenosis with insufficiency: Secondary | ICD-10-CM | POA: Diagnosis not present

## 2018-12-17 DIAGNOSIS — M6281 Muscle weakness (generalized): Secondary | ICD-10-CM | POA: Diagnosis not present

## 2018-12-17 DIAGNOSIS — M1990 Unspecified osteoarthritis, unspecified site: Secondary | ICD-10-CM | POA: Diagnosis not present

## 2018-12-17 DIAGNOSIS — I2699 Other pulmonary embolism without acute cor pulmonale: Secondary | ICD-10-CM | POA: Diagnosis not present

## 2018-12-18 DIAGNOSIS — I352 Nonrheumatic aortic (valve) stenosis with insufficiency: Secondary | ICD-10-CM | POA: Diagnosis not present

## 2018-12-18 DIAGNOSIS — M1990 Unspecified osteoarthritis, unspecified site: Secondary | ICD-10-CM | POA: Diagnosis not present

## 2018-12-18 DIAGNOSIS — M6281 Muscle weakness (generalized): Secondary | ICD-10-CM | POA: Diagnosis not present

## 2018-12-18 DIAGNOSIS — R1311 Dysphagia, oral phase: Secondary | ICD-10-CM | POA: Diagnosis not present

## 2018-12-18 DIAGNOSIS — R262 Difficulty in walking, not elsewhere classified: Secondary | ICD-10-CM | POA: Diagnosis not present

## 2018-12-18 DIAGNOSIS — Z9181 History of falling: Secondary | ICD-10-CM | POA: Diagnosis not present

## 2018-12-18 DIAGNOSIS — I2699 Other pulmonary embolism without acute cor pulmonale: Secondary | ICD-10-CM | POA: Diagnosis not present

## 2018-12-18 DIAGNOSIS — N179 Acute kidney failure, unspecified: Secondary | ICD-10-CM | POA: Diagnosis not present

## 2018-12-18 DIAGNOSIS — F0391 Unspecified dementia with behavioral disturbance: Secondary | ICD-10-CM | POA: Diagnosis not present

## 2018-12-19 DIAGNOSIS — R1311 Dysphagia, oral phase: Secondary | ICD-10-CM | POA: Diagnosis not present

## 2018-12-19 DIAGNOSIS — I352 Nonrheumatic aortic (valve) stenosis with insufficiency: Secondary | ICD-10-CM | POA: Diagnosis not present

## 2018-12-19 DIAGNOSIS — F0391 Unspecified dementia with behavioral disturbance: Secondary | ICD-10-CM | POA: Diagnosis not present

## 2018-12-19 DIAGNOSIS — M1990 Unspecified osteoarthritis, unspecified site: Secondary | ICD-10-CM | POA: Diagnosis not present

## 2018-12-19 DIAGNOSIS — M6281 Muscle weakness (generalized): Secondary | ICD-10-CM | POA: Diagnosis not present

## 2018-12-19 DIAGNOSIS — N179 Acute kidney failure, unspecified: Secondary | ICD-10-CM | POA: Diagnosis not present

## 2018-12-19 DIAGNOSIS — Z9181 History of falling: Secondary | ICD-10-CM | POA: Diagnosis not present

## 2018-12-19 DIAGNOSIS — I2699 Other pulmonary embolism without acute cor pulmonale: Secondary | ICD-10-CM | POA: Diagnosis not present

## 2018-12-19 DIAGNOSIS — R262 Difficulty in walking, not elsewhere classified: Secondary | ICD-10-CM | POA: Diagnosis not present

## 2018-12-21 DIAGNOSIS — R262 Difficulty in walking, not elsewhere classified: Secondary | ICD-10-CM | POA: Diagnosis not present

## 2018-12-21 DIAGNOSIS — M6281 Muscle weakness (generalized): Secondary | ICD-10-CM | POA: Diagnosis not present

## 2018-12-21 DIAGNOSIS — F0391 Unspecified dementia with behavioral disturbance: Secondary | ICD-10-CM | POA: Diagnosis not present

## 2018-12-21 DIAGNOSIS — I2699 Other pulmonary embolism without acute cor pulmonale: Secondary | ICD-10-CM | POA: Diagnosis not present

## 2018-12-21 DIAGNOSIS — N179 Acute kidney failure, unspecified: Secondary | ICD-10-CM | POA: Diagnosis not present

## 2018-12-21 DIAGNOSIS — Z9181 History of falling: Secondary | ICD-10-CM | POA: Diagnosis not present

## 2018-12-21 DIAGNOSIS — R1311 Dysphagia, oral phase: Secondary | ICD-10-CM | POA: Diagnosis not present

## 2018-12-21 DIAGNOSIS — M1990 Unspecified osteoarthritis, unspecified site: Secondary | ICD-10-CM | POA: Diagnosis not present

## 2018-12-21 DIAGNOSIS — I352 Nonrheumatic aortic (valve) stenosis with insufficiency: Secondary | ICD-10-CM | POA: Diagnosis not present

## 2018-12-22 DIAGNOSIS — M6281 Muscle weakness (generalized): Secondary | ICD-10-CM | POA: Diagnosis not present

## 2018-12-22 DIAGNOSIS — N179 Acute kidney failure, unspecified: Secondary | ICD-10-CM | POA: Diagnosis not present

## 2018-12-22 DIAGNOSIS — R1311 Dysphagia, oral phase: Secondary | ICD-10-CM | POA: Diagnosis not present

## 2018-12-22 DIAGNOSIS — I2699 Other pulmonary embolism without acute cor pulmonale: Secondary | ICD-10-CM | POA: Diagnosis not present

## 2018-12-22 DIAGNOSIS — Z9181 History of falling: Secondary | ICD-10-CM | POA: Diagnosis not present

## 2018-12-22 DIAGNOSIS — I352 Nonrheumatic aortic (valve) stenosis with insufficiency: Secondary | ICD-10-CM | POA: Diagnosis not present

## 2018-12-22 DIAGNOSIS — F0391 Unspecified dementia with behavioral disturbance: Secondary | ICD-10-CM | POA: Diagnosis not present

## 2018-12-22 DIAGNOSIS — R262 Difficulty in walking, not elsewhere classified: Secondary | ICD-10-CM | POA: Diagnosis not present

## 2018-12-22 DIAGNOSIS — M1990 Unspecified osteoarthritis, unspecified site: Secondary | ICD-10-CM | POA: Diagnosis not present

## 2018-12-23 DIAGNOSIS — N179 Acute kidney failure, unspecified: Secondary | ICD-10-CM | POA: Diagnosis not present

## 2018-12-23 DIAGNOSIS — F0391 Unspecified dementia with behavioral disturbance: Secondary | ICD-10-CM | POA: Diagnosis not present

## 2018-12-23 DIAGNOSIS — R1311 Dysphagia, oral phase: Secondary | ICD-10-CM | POA: Diagnosis not present

## 2018-12-23 DIAGNOSIS — R262 Difficulty in walking, not elsewhere classified: Secondary | ICD-10-CM | POA: Diagnosis not present

## 2018-12-23 DIAGNOSIS — M6281 Muscle weakness (generalized): Secondary | ICD-10-CM | POA: Diagnosis not present

## 2018-12-23 DIAGNOSIS — I352 Nonrheumatic aortic (valve) stenosis with insufficiency: Secondary | ICD-10-CM | POA: Diagnosis not present

## 2018-12-23 DIAGNOSIS — M1990 Unspecified osteoarthritis, unspecified site: Secondary | ICD-10-CM | POA: Diagnosis not present

## 2018-12-23 DIAGNOSIS — Z9181 History of falling: Secondary | ICD-10-CM | POA: Diagnosis not present

## 2018-12-23 DIAGNOSIS — I2699 Other pulmonary embolism without acute cor pulmonale: Secondary | ICD-10-CM | POA: Diagnosis not present

## 2018-12-24 DIAGNOSIS — I2699 Other pulmonary embolism without acute cor pulmonale: Secondary | ICD-10-CM | POA: Diagnosis not present

## 2018-12-24 DIAGNOSIS — R1311 Dysphagia, oral phase: Secondary | ICD-10-CM | POA: Diagnosis not present

## 2018-12-24 DIAGNOSIS — N179 Acute kidney failure, unspecified: Secondary | ICD-10-CM | POA: Diagnosis not present

## 2018-12-24 DIAGNOSIS — M6281 Muscle weakness (generalized): Secondary | ICD-10-CM | POA: Diagnosis not present

## 2018-12-24 DIAGNOSIS — F0391 Unspecified dementia with behavioral disturbance: Secondary | ICD-10-CM | POA: Diagnosis not present

## 2018-12-24 DIAGNOSIS — R262 Difficulty in walking, not elsewhere classified: Secondary | ICD-10-CM | POA: Diagnosis not present

## 2018-12-24 DIAGNOSIS — Z9181 History of falling: Secondary | ICD-10-CM | POA: Diagnosis not present

## 2018-12-24 DIAGNOSIS — M1990 Unspecified osteoarthritis, unspecified site: Secondary | ICD-10-CM | POA: Diagnosis not present

## 2018-12-24 DIAGNOSIS — I352 Nonrheumatic aortic (valve) stenosis with insufficiency: Secondary | ICD-10-CM | POA: Diagnosis not present

## 2018-12-25 DIAGNOSIS — I352 Nonrheumatic aortic (valve) stenosis with insufficiency: Secondary | ICD-10-CM | POA: Diagnosis not present

## 2018-12-25 DIAGNOSIS — N179 Acute kidney failure, unspecified: Secondary | ICD-10-CM | POA: Diagnosis not present

## 2018-12-25 DIAGNOSIS — M6281 Muscle weakness (generalized): Secondary | ICD-10-CM | POA: Diagnosis not present

## 2018-12-25 DIAGNOSIS — M1990 Unspecified osteoarthritis, unspecified site: Secondary | ICD-10-CM | POA: Diagnosis not present

## 2018-12-25 DIAGNOSIS — F0391 Unspecified dementia with behavioral disturbance: Secondary | ICD-10-CM | POA: Diagnosis not present

## 2018-12-25 DIAGNOSIS — R1311 Dysphagia, oral phase: Secondary | ICD-10-CM | POA: Diagnosis not present

## 2018-12-25 DIAGNOSIS — Z9181 History of falling: Secondary | ICD-10-CM | POA: Diagnosis not present

## 2018-12-25 DIAGNOSIS — I2699 Other pulmonary embolism without acute cor pulmonale: Secondary | ICD-10-CM | POA: Diagnosis not present

## 2018-12-25 DIAGNOSIS — R262 Difficulty in walking, not elsewhere classified: Secondary | ICD-10-CM | POA: Diagnosis not present

## 2018-12-26 DIAGNOSIS — R262 Difficulty in walking, not elsewhere classified: Secondary | ICD-10-CM | POA: Diagnosis not present

## 2018-12-26 DIAGNOSIS — I2699 Other pulmonary embolism without acute cor pulmonale: Secondary | ICD-10-CM | POA: Diagnosis not present

## 2018-12-26 DIAGNOSIS — F0391 Unspecified dementia with behavioral disturbance: Secondary | ICD-10-CM | POA: Diagnosis not present

## 2018-12-26 DIAGNOSIS — Z9181 History of falling: Secondary | ICD-10-CM | POA: Diagnosis not present

## 2018-12-26 DIAGNOSIS — R1311 Dysphagia, oral phase: Secondary | ICD-10-CM | POA: Diagnosis not present

## 2018-12-26 DIAGNOSIS — I352 Nonrheumatic aortic (valve) stenosis with insufficiency: Secondary | ICD-10-CM | POA: Diagnosis not present

## 2018-12-26 DIAGNOSIS — M6281 Muscle weakness (generalized): Secondary | ICD-10-CM | POA: Diagnosis not present

## 2018-12-26 DIAGNOSIS — N179 Acute kidney failure, unspecified: Secondary | ICD-10-CM | POA: Diagnosis not present

## 2018-12-26 DIAGNOSIS — M1990 Unspecified osteoarthritis, unspecified site: Secondary | ICD-10-CM | POA: Diagnosis not present

## 2018-12-28 DIAGNOSIS — R262 Difficulty in walking, not elsewhere classified: Secondary | ICD-10-CM | POA: Diagnosis not present

## 2018-12-28 DIAGNOSIS — R1311 Dysphagia, oral phase: Secondary | ICD-10-CM | POA: Diagnosis not present

## 2018-12-28 DIAGNOSIS — I2699 Other pulmonary embolism without acute cor pulmonale: Secondary | ICD-10-CM | POA: Diagnosis not present

## 2018-12-28 DIAGNOSIS — M1990 Unspecified osteoarthritis, unspecified site: Secondary | ICD-10-CM | POA: Diagnosis not present

## 2018-12-28 DIAGNOSIS — Z9181 History of falling: Secondary | ICD-10-CM | POA: Diagnosis not present

## 2018-12-28 DIAGNOSIS — N179 Acute kidney failure, unspecified: Secondary | ICD-10-CM | POA: Diagnosis not present

## 2018-12-28 DIAGNOSIS — M6281 Muscle weakness (generalized): Secondary | ICD-10-CM | POA: Diagnosis not present

## 2018-12-28 DIAGNOSIS — I352 Nonrheumatic aortic (valve) stenosis with insufficiency: Secondary | ICD-10-CM | POA: Diagnosis not present

## 2018-12-28 DIAGNOSIS — F0391 Unspecified dementia with behavioral disturbance: Secondary | ICD-10-CM | POA: Diagnosis not present

## 2018-12-29 DIAGNOSIS — Z9181 History of falling: Secondary | ICD-10-CM | POA: Diagnosis not present

## 2018-12-29 DIAGNOSIS — R262 Difficulty in walking, not elsewhere classified: Secondary | ICD-10-CM | POA: Diagnosis not present

## 2018-12-29 DIAGNOSIS — I2699 Other pulmonary embolism without acute cor pulmonale: Secondary | ICD-10-CM | POA: Diagnosis not present

## 2018-12-29 DIAGNOSIS — I352 Nonrheumatic aortic (valve) stenosis with insufficiency: Secondary | ICD-10-CM | POA: Diagnosis not present

## 2018-12-29 DIAGNOSIS — N179 Acute kidney failure, unspecified: Secondary | ICD-10-CM | POA: Diagnosis not present

## 2018-12-29 DIAGNOSIS — R1311 Dysphagia, oral phase: Secondary | ICD-10-CM | POA: Diagnosis not present

## 2018-12-29 DIAGNOSIS — F0391 Unspecified dementia with behavioral disturbance: Secondary | ICD-10-CM | POA: Diagnosis not present

## 2018-12-29 DIAGNOSIS — M6281 Muscle weakness (generalized): Secondary | ICD-10-CM | POA: Diagnosis not present

## 2018-12-29 DIAGNOSIS — M1990 Unspecified osteoarthritis, unspecified site: Secondary | ICD-10-CM | POA: Diagnosis not present

## 2018-12-30 DIAGNOSIS — R1311 Dysphagia, oral phase: Secondary | ICD-10-CM | POA: Diagnosis not present

## 2018-12-30 DIAGNOSIS — I352 Nonrheumatic aortic (valve) stenosis with insufficiency: Secondary | ICD-10-CM | POA: Diagnosis not present

## 2018-12-30 DIAGNOSIS — M6281 Muscle weakness (generalized): Secondary | ICD-10-CM | POA: Diagnosis not present

## 2018-12-30 DIAGNOSIS — F0391 Unspecified dementia with behavioral disturbance: Secondary | ICD-10-CM | POA: Diagnosis not present

## 2018-12-30 DIAGNOSIS — Z9181 History of falling: Secondary | ICD-10-CM | POA: Diagnosis not present

## 2018-12-30 DIAGNOSIS — I2699 Other pulmonary embolism without acute cor pulmonale: Secondary | ICD-10-CM | POA: Diagnosis not present

## 2018-12-30 DIAGNOSIS — N179 Acute kidney failure, unspecified: Secondary | ICD-10-CM | POA: Diagnosis not present

## 2018-12-30 DIAGNOSIS — M1990 Unspecified osteoarthritis, unspecified site: Secondary | ICD-10-CM | POA: Diagnosis not present

## 2018-12-30 DIAGNOSIS — R262 Difficulty in walking, not elsewhere classified: Secondary | ICD-10-CM | POA: Diagnosis not present

## 2018-12-31 DIAGNOSIS — Z9181 History of falling: Secondary | ICD-10-CM | POA: Diagnosis not present

## 2018-12-31 DIAGNOSIS — R1311 Dysphagia, oral phase: Secondary | ICD-10-CM | POA: Diagnosis not present

## 2018-12-31 DIAGNOSIS — M6281 Muscle weakness (generalized): Secondary | ICD-10-CM | POA: Diagnosis not present

## 2018-12-31 DIAGNOSIS — N179 Acute kidney failure, unspecified: Secondary | ICD-10-CM | POA: Diagnosis not present

## 2018-12-31 DIAGNOSIS — F0391 Unspecified dementia with behavioral disturbance: Secondary | ICD-10-CM | POA: Diagnosis not present

## 2018-12-31 DIAGNOSIS — I2699 Other pulmonary embolism without acute cor pulmonale: Secondary | ICD-10-CM | POA: Diagnosis not present

## 2018-12-31 DIAGNOSIS — R262 Difficulty in walking, not elsewhere classified: Secondary | ICD-10-CM | POA: Diagnosis not present

## 2018-12-31 DIAGNOSIS — I352 Nonrheumatic aortic (valve) stenosis with insufficiency: Secondary | ICD-10-CM | POA: Diagnosis not present

## 2018-12-31 DIAGNOSIS — M1990 Unspecified osteoarthritis, unspecified site: Secondary | ICD-10-CM | POA: Diagnosis not present

## 2019-01-01 DIAGNOSIS — R262 Difficulty in walking, not elsewhere classified: Secondary | ICD-10-CM | POA: Diagnosis not present

## 2019-01-01 DIAGNOSIS — N179 Acute kidney failure, unspecified: Secondary | ICD-10-CM | POA: Diagnosis not present

## 2019-01-01 DIAGNOSIS — R1311 Dysphagia, oral phase: Secondary | ICD-10-CM | POA: Diagnosis not present

## 2019-01-01 DIAGNOSIS — I352 Nonrheumatic aortic (valve) stenosis with insufficiency: Secondary | ICD-10-CM | POA: Diagnosis not present

## 2019-01-01 DIAGNOSIS — I2699 Other pulmonary embolism without acute cor pulmonale: Secondary | ICD-10-CM | POA: Diagnosis not present

## 2019-01-01 DIAGNOSIS — Z9181 History of falling: Secondary | ICD-10-CM | POA: Diagnosis not present

## 2019-01-01 DIAGNOSIS — F0391 Unspecified dementia with behavioral disturbance: Secondary | ICD-10-CM | POA: Diagnosis not present

## 2019-01-01 DIAGNOSIS — M1990 Unspecified osteoarthritis, unspecified site: Secondary | ICD-10-CM | POA: Diagnosis not present

## 2019-01-01 DIAGNOSIS — I10 Essential (primary) hypertension: Secondary | ICD-10-CM | POA: Diagnosis not present

## 2019-01-01 DIAGNOSIS — M6281 Muscle weakness (generalized): Secondary | ICD-10-CM | POA: Diagnosis not present

## 2019-01-08 DIAGNOSIS — F331 Major depressive disorder, recurrent, moderate: Secondary | ICD-10-CM | POA: Diagnosis not present

## 2019-01-13 DIAGNOSIS — Z1159 Encounter for screening for other viral diseases: Secondary | ICD-10-CM | POA: Diagnosis not present

## 2019-01-18 DIAGNOSIS — Z1159 Encounter for screening for other viral diseases: Secondary | ICD-10-CM | POA: Diagnosis not present

## 2019-01-28 DIAGNOSIS — D508 Other iron deficiency anemias: Secondary | ICD-10-CM | POA: Diagnosis not present

## 2019-01-28 DIAGNOSIS — I10 Essential (primary) hypertension: Secondary | ICD-10-CM | POA: Diagnosis not present

## 2019-01-28 DIAGNOSIS — M6281 Muscle weakness (generalized): Secondary | ICD-10-CM | POA: Diagnosis not present

## 2019-01-28 DIAGNOSIS — M1990 Unspecified osteoarthritis, unspecified site: Secondary | ICD-10-CM | POA: Diagnosis not present

## 2019-02-08 DIAGNOSIS — Z1159 Encounter for screening for other viral diseases: Secondary | ICD-10-CM | POA: Diagnosis not present

## 2019-02-18 DIAGNOSIS — D649 Anemia, unspecified: Secondary | ICD-10-CM | POA: Diagnosis not present

## 2019-02-18 DIAGNOSIS — M6281 Muscle weakness (generalized): Secondary | ICD-10-CM | POA: Diagnosis not present

## 2019-02-18 DIAGNOSIS — M1990 Unspecified osteoarthritis, unspecified site: Secondary | ICD-10-CM | POA: Diagnosis not present

## 2019-02-18 DIAGNOSIS — I10 Essential (primary) hypertension: Secondary | ICD-10-CM | POA: Diagnosis not present

## 2019-02-19 DIAGNOSIS — E649 Sequelae of unspecified nutritional deficiency: Secondary | ICD-10-CM | POA: Diagnosis not present

## 2019-02-19 DIAGNOSIS — E785 Hyperlipidemia, unspecified: Secondary | ICD-10-CM | POA: Diagnosis not present

## 2019-02-19 DIAGNOSIS — D649 Anemia, unspecified: Secondary | ICD-10-CM | POA: Diagnosis not present

## 2019-02-20 DIAGNOSIS — M79602 Pain in left arm: Secondary | ICD-10-CM | POA: Diagnosis not present

## 2019-02-20 DIAGNOSIS — M19032 Primary osteoarthritis, left wrist: Secondary | ICD-10-CM | POA: Diagnosis not present

## 2019-02-20 DIAGNOSIS — M19042 Primary osteoarthritis, left hand: Secondary | ICD-10-CM | POA: Diagnosis not present

## 2019-03-26 DIAGNOSIS — U071 COVID-19: Secondary | ICD-10-CM | POA: Diagnosis not present

## 2019-03-26 DIAGNOSIS — Z1152 Encounter for screening for COVID-19: Secondary | ICD-10-CM | POA: Diagnosis not present

## 2019-03-31 DIAGNOSIS — I10 Essential (primary) hypertension: Secondary | ICD-10-CM | POA: Diagnosis not present

## 2019-03-31 DIAGNOSIS — M6281 Muscle weakness (generalized): Secondary | ICD-10-CM | POA: Diagnosis not present

## 2019-03-31 DIAGNOSIS — E43 Unspecified severe protein-calorie malnutrition: Secondary | ICD-10-CM | POA: Diagnosis not present

## 2019-03-31 DIAGNOSIS — M1990 Unspecified osteoarthritis, unspecified site: Secondary | ICD-10-CM | POA: Diagnosis not present

## 2019-04-06 DIAGNOSIS — Z9181 History of falling: Secondary | ICD-10-CM | POA: Diagnosis not present

## 2019-04-06 DIAGNOSIS — F0391 Unspecified dementia with behavioral disturbance: Secondary | ICD-10-CM | POA: Diagnosis not present

## 2019-04-06 DIAGNOSIS — R262 Difficulty in walking, not elsewhere classified: Secondary | ICD-10-CM | POA: Diagnosis not present

## 2019-04-06 DIAGNOSIS — I352 Nonrheumatic aortic (valve) stenosis with insufficiency: Secondary | ICD-10-CM | POA: Diagnosis not present

## 2019-04-06 DIAGNOSIS — M6281 Muscle weakness (generalized): Secondary | ICD-10-CM | POA: Diagnosis not present

## 2019-04-06 DIAGNOSIS — M1990 Unspecified osteoarthritis, unspecified site: Secondary | ICD-10-CM | POA: Diagnosis not present

## 2019-04-06 DIAGNOSIS — R1311 Dysphagia, oral phase: Secondary | ICD-10-CM | POA: Diagnosis not present

## 2019-04-06 DIAGNOSIS — N179 Acute kidney failure, unspecified: Secondary | ICD-10-CM | POA: Diagnosis not present

## 2019-04-06 DIAGNOSIS — I2699 Other pulmonary embolism without acute cor pulmonale: Secondary | ICD-10-CM | POA: Diagnosis not present

## 2019-04-07 DIAGNOSIS — R1311 Dysphagia, oral phase: Secondary | ICD-10-CM | POA: Diagnosis not present

## 2019-04-07 DIAGNOSIS — F0391 Unspecified dementia with behavioral disturbance: Secondary | ICD-10-CM | POA: Diagnosis not present

## 2019-04-07 DIAGNOSIS — Z9181 History of falling: Secondary | ICD-10-CM | POA: Diagnosis not present

## 2019-04-07 DIAGNOSIS — R262 Difficulty in walking, not elsewhere classified: Secondary | ICD-10-CM | POA: Diagnosis not present

## 2019-04-07 DIAGNOSIS — I2699 Other pulmonary embolism without acute cor pulmonale: Secondary | ICD-10-CM | POA: Diagnosis not present

## 2019-04-07 DIAGNOSIS — I352 Nonrheumatic aortic (valve) stenosis with insufficiency: Secondary | ICD-10-CM | POA: Diagnosis not present

## 2019-04-07 DIAGNOSIS — N179 Acute kidney failure, unspecified: Secondary | ICD-10-CM | POA: Diagnosis not present

## 2019-04-07 DIAGNOSIS — M1990 Unspecified osteoarthritis, unspecified site: Secondary | ICD-10-CM | POA: Diagnosis not present

## 2019-04-07 DIAGNOSIS — M6281 Muscle weakness (generalized): Secondary | ICD-10-CM | POA: Diagnosis not present

## 2019-04-08 DIAGNOSIS — I352 Nonrheumatic aortic (valve) stenosis with insufficiency: Secondary | ICD-10-CM | POA: Diagnosis not present

## 2019-04-08 DIAGNOSIS — R262 Difficulty in walking, not elsewhere classified: Secondary | ICD-10-CM | POA: Diagnosis not present

## 2019-04-08 DIAGNOSIS — F0391 Unspecified dementia with behavioral disturbance: Secondary | ICD-10-CM | POA: Diagnosis not present

## 2019-04-08 DIAGNOSIS — Z9181 History of falling: Secondary | ICD-10-CM | POA: Diagnosis not present

## 2019-04-08 DIAGNOSIS — M6281 Muscle weakness (generalized): Secondary | ICD-10-CM | POA: Diagnosis not present

## 2019-04-08 DIAGNOSIS — M1990 Unspecified osteoarthritis, unspecified site: Secondary | ICD-10-CM | POA: Diagnosis not present

## 2019-04-08 DIAGNOSIS — N179 Acute kidney failure, unspecified: Secondary | ICD-10-CM | POA: Diagnosis not present

## 2019-04-08 DIAGNOSIS — I2699 Other pulmonary embolism without acute cor pulmonale: Secondary | ICD-10-CM | POA: Diagnosis not present

## 2019-04-08 DIAGNOSIS — R1311 Dysphagia, oral phase: Secondary | ICD-10-CM | POA: Diagnosis not present

## 2019-04-09 DIAGNOSIS — R1311 Dysphagia, oral phase: Secondary | ICD-10-CM | POA: Diagnosis not present

## 2019-04-09 DIAGNOSIS — F0391 Unspecified dementia with behavioral disturbance: Secondary | ICD-10-CM | POA: Diagnosis not present

## 2019-04-09 DIAGNOSIS — M1990 Unspecified osteoarthritis, unspecified site: Secondary | ICD-10-CM | POA: Diagnosis not present

## 2019-04-09 DIAGNOSIS — N179 Acute kidney failure, unspecified: Secondary | ICD-10-CM | POA: Diagnosis not present

## 2019-04-09 DIAGNOSIS — I352 Nonrheumatic aortic (valve) stenosis with insufficiency: Secondary | ICD-10-CM | POA: Diagnosis not present

## 2019-04-09 DIAGNOSIS — Z9181 History of falling: Secondary | ICD-10-CM | POA: Diagnosis not present

## 2019-04-09 DIAGNOSIS — I2699 Other pulmonary embolism without acute cor pulmonale: Secondary | ICD-10-CM | POA: Diagnosis not present

## 2019-04-09 DIAGNOSIS — R262 Difficulty in walking, not elsewhere classified: Secondary | ICD-10-CM | POA: Diagnosis not present

## 2019-04-09 DIAGNOSIS — M6281 Muscle weakness (generalized): Secondary | ICD-10-CM | POA: Diagnosis not present

## 2019-04-12 DIAGNOSIS — N179 Acute kidney failure, unspecified: Secondary | ICD-10-CM | POA: Diagnosis not present

## 2019-04-12 DIAGNOSIS — F0391 Unspecified dementia with behavioral disturbance: Secondary | ICD-10-CM | POA: Diagnosis not present

## 2019-04-12 DIAGNOSIS — M6281 Muscle weakness (generalized): Secondary | ICD-10-CM | POA: Diagnosis not present

## 2019-04-12 DIAGNOSIS — Z9181 History of falling: Secondary | ICD-10-CM | POA: Diagnosis not present

## 2019-04-12 DIAGNOSIS — R1311 Dysphagia, oral phase: Secondary | ICD-10-CM | POA: Diagnosis not present

## 2019-04-12 DIAGNOSIS — M1990 Unspecified osteoarthritis, unspecified site: Secondary | ICD-10-CM | POA: Diagnosis not present

## 2019-04-12 DIAGNOSIS — I2699 Other pulmonary embolism without acute cor pulmonale: Secondary | ICD-10-CM | POA: Diagnosis not present

## 2019-04-12 DIAGNOSIS — I352 Nonrheumatic aortic (valve) stenosis with insufficiency: Secondary | ICD-10-CM | POA: Diagnosis not present

## 2019-04-12 DIAGNOSIS — R262 Difficulty in walking, not elsewhere classified: Secondary | ICD-10-CM | POA: Diagnosis not present

## 2019-04-13 DIAGNOSIS — R262 Difficulty in walking, not elsewhere classified: Secondary | ICD-10-CM | POA: Diagnosis not present

## 2019-04-13 DIAGNOSIS — I352 Nonrheumatic aortic (valve) stenosis with insufficiency: Secondary | ICD-10-CM | POA: Diagnosis not present

## 2019-04-13 DIAGNOSIS — R1311 Dysphagia, oral phase: Secondary | ICD-10-CM | POA: Diagnosis not present

## 2019-04-13 DIAGNOSIS — N179 Acute kidney failure, unspecified: Secondary | ICD-10-CM | POA: Diagnosis not present

## 2019-04-13 DIAGNOSIS — F0391 Unspecified dementia with behavioral disturbance: Secondary | ICD-10-CM | POA: Diagnosis not present

## 2019-04-13 DIAGNOSIS — M6281 Muscle weakness (generalized): Secondary | ICD-10-CM | POA: Diagnosis not present

## 2019-04-13 DIAGNOSIS — M1990 Unspecified osteoarthritis, unspecified site: Secondary | ICD-10-CM | POA: Diagnosis not present

## 2019-04-13 DIAGNOSIS — Z9181 History of falling: Secondary | ICD-10-CM | POA: Diagnosis not present

## 2019-04-13 DIAGNOSIS — I2699 Other pulmonary embolism without acute cor pulmonale: Secondary | ICD-10-CM | POA: Diagnosis not present

## 2019-04-14 DIAGNOSIS — I352 Nonrheumatic aortic (valve) stenosis with insufficiency: Secondary | ICD-10-CM | POA: Diagnosis not present

## 2019-04-14 DIAGNOSIS — F0391 Unspecified dementia with behavioral disturbance: Secondary | ICD-10-CM | POA: Diagnosis not present

## 2019-04-14 DIAGNOSIS — Z9181 History of falling: Secondary | ICD-10-CM | POA: Diagnosis not present

## 2019-04-14 DIAGNOSIS — R262 Difficulty in walking, not elsewhere classified: Secondary | ICD-10-CM | POA: Diagnosis not present

## 2019-04-14 DIAGNOSIS — N179 Acute kidney failure, unspecified: Secondary | ICD-10-CM | POA: Diagnosis not present

## 2019-04-14 DIAGNOSIS — M6281 Muscle weakness (generalized): Secondary | ICD-10-CM | POA: Diagnosis not present

## 2019-04-14 DIAGNOSIS — R1311 Dysphagia, oral phase: Secondary | ICD-10-CM | POA: Diagnosis not present

## 2019-04-14 DIAGNOSIS — M1990 Unspecified osteoarthritis, unspecified site: Secondary | ICD-10-CM | POA: Diagnosis not present

## 2019-04-14 DIAGNOSIS — I2699 Other pulmonary embolism without acute cor pulmonale: Secondary | ICD-10-CM | POA: Diagnosis not present

## 2019-04-15 DIAGNOSIS — N179 Acute kidney failure, unspecified: Secondary | ICD-10-CM | POA: Diagnosis not present

## 2019-04-15 DIAGNOSIS — M6281 Muscle weakness (generalized): Secondary | ICD-10-CM | POA: Diagnosis not present

## 2019-04-15 DIAGNOSIS — F0391 Unspecified dementia with behavioral disturbance: Secondary | ICD-10-CM | POA: Diagnosis not present

## 2019-04-15 DIAGNOSIS — I352 Nonrheumatic aortic (valve) stenosis with insufficiency: Secondary | ICD-10-CM | POA: Diagnosis not present

## 2019-04-15 DIAGNOSIS — R1311 Dysphagia, oral phase: Secondary | ICD-10-CM | POA: Diagnosis not present

## 2019-04-15 DIAGNOSIS — I2699 Other pulmonary embolism without acute cor pulmonale: Secondary | ICD-10-CM | POA: Diagnosis not present

## 2019-04-15 DIAGNOSIS — M1990 Unspecified osteoarthritis, unspecified site: Secondary | ICD-10-CM | POA: Diagnosis not present

## 2019-04-15 DIAGNOSIS — Z9181 History of falling: Secondary | ICD-10-CM | POA: Diagnosis not present

## 2019-04-15 DIAGNOSIS — R262 Difficulty in walking, not elsewhere classified: Secondary | ICD-10-CM | POA: Diagnosis not present

## 2019-04-16 DIAGNOSIS — R1311 Dysphagia, oral phase: Secondary | ICD-10-CM | POA: Diagnosis not present

## 2019-04-16 DIAGNOSIS — Z9181 History of falling: Secondary | ICD-10-CM | POA: Diagnosis not present

## 2019-04-16 DIAGNOSIS — I2699 Other pulmonary embolism without acute cor pulmonale: Secondary | ICD-10-CM | POA: Diagnosis not present

## 2019-04-16 DIAGNOSIS — I352 Nonrheumatic aortic (valve) stenosis with insufficiency: Secondary | ICD-10-CM | POA: Diagnosis not present

## 2019-04-16 DIAGNOSIS — F0391 Unspecified dementia with behavioral disturbance: Secondary | ICD-10-CM | POA: Diagnosis not present

## 2019-04-16 DIAGNOSIS — R262 Difficulty in walking, not elsewhere classified: Secondary | ICD-10-CM | POA: Diagnosis not present

## 2019-04-16 DIAGNOSIS — N179 Acute kidney failure, unspecified: Secondary | ICD-10-CM | POA: Diagnosis not present

## 2019-04-16 DIAGNOSIS — M1990 Unspecified osteoarthritis, unspecified site: Secondary | ICD-10-CM | POA: Diagnosis not present

## 2019-04-16 DIAGNOSIS — M6281 Muscle weakness (generalized): Secondary | ICD-10-CM | POA: Diagnosis not present

## 2019-04-19 DIAGNOSIS — M6281 Muscle weakness (generalized): Secondary | ICD-10-CM | POA: Diagnosis not present

## 2019-04-19 DIAGNOSIS — I352 Nonrheumatic aortic (valve) stenosis with insufficiency: Secondary | ICD-10-CM | POA: Diagnosis not present

## 2019-04-19 DIAGNOSIS — I2699 Other pulmonary embolism without acute cor pulmonale: Secondary | ICD-10-CM | POA: Diagnosis not present

## 2019-04-19 DIAGNOSIS — N179 Acute kidney failure, unspecified: Secondary | ICD-10-CM | POA: Diagnosis not present

## 2019-04-19 DIAGNOSIS — R262 Difficulty in walking, not elsewhere classified: Secondary | ICD-10-CM | POA: Diagnosis not present

## 2019-04-19 DIAGNOSIS — M1990 Unspecified osteoarthritis, unspecified site: Secondary | ICD-10-CM | POA: Diagnosis not present

## 2019-04-19 DIAGNOSIS — Z9181 History of falling: Secondary | ICD-10-CM | POA: Diagnosis not present

## 2019-04-19 DIAGNOSIS — R1311 Dysphagia, oral phase: Secondary | ICD-10-CM | POA: Diagnosis not present

## 2019-04-19 DIAGNOSIS — F0391 Unspecified dementia with behavioral disturbance: Secondary | ICD-10-CM | POA: Diagnosis not present

## 2019-04-20 DIAGNOSIS — R262 Difficulty in walking, not elsewhere classified: Secondary | ICD-10-CM | POA: Diagnosis not present

## 2019-04-20 DIAGNOSIS — F0391 Unspecified dementia with behavioral disturbance: Secondary | ICD-10-CM | POA: Diagnosis not present

## 2019-04-20 DIAGNOSIS — M6281 Muscle weakness (generalized): Secondary | ICD-10-CM | POA: Diagnosis not present

## 2019-04-20 DIAGNOSIS — M1990 Unspecified osteoarthritis, unspecified site: Secondary | ICD-10-CM | POA: Diagnosis not present

## 2019-04-20 DIAGNOSIS — N179 Acute kidney failure, unspecified: Secondary | ICD-10-CM | POA: Diagnosis not present

## 2019-04-20 DIAGNOSIS — Z9181 History of falling: Secondary | ICD-10-CM | POA: Diagnosis not present

## 2019-04-20 DIAGNOSIS — I352 Nonrheumatic aortic (valve) stenosis with insufficiency: Secondary | ICD-10-CM | POA: Diagnosis not present

## 2019-04-20 DIAGNOSIS — R1311 Dysphagia, oral phase: Secondary | ICD-10-CM | POA: Diagnosis not present

## 2019-04-20 DIAGNOSIS — I2699 Other pulmonary embolism without acute cor pulmonale: Secondary | ICD-10-CM | POA: Diagnosis not present

## 2019-04-21 DIAGNOSIS — M6281 Muscle weakness (generalized): Secondary | ICD-10-CM | POA: Diagnosis not present

## 2019-04-21 DIAGNOSIS — N179 Acute kidney failure, unspecified: Secondary | ICD-10-CM | POA: Diagnosis not present

## 2019-04-21 DIAGNOSIS — M1990 Unspecified osteoarthritis, unspecified site: Secondary | ICD-10-CM | POA: Diagnosis not present

## 2019-04-21 DIAGNOSIS — F0391 Unspecified dementia with behavioral disturbance: Secondary | ICD-10-CM | POA: Diagnosis not present

## 2019-04-21 DIAGNOSIS — R262 Difficulty in walking, not elsewhere classified: Secondary | ICD-10-CM | POA: Diagnosis not present

## 2019-04-21 DIAGNOSIS — I2699 Other pulmonary embolism without acute cor pulmonale: Secondary | ICD-10-CM | POA: Diagnosis not present

## 2019-04-21 DIAGNOSIS — I352 Nonrheumatic aortic (valve) stenosis with insufficiency: Secondary | ICD-10-CM | POA: Diagnosis not present

## 2019-04-21 DIAGNOSIS — Z9181 History of falling: Secondary | ICD-10-CM | POA: Diagnosis not present

## 2019-04-21 DIAGNOSIS — R1311 Dysphagia, oral phase: Secondary | ICD-10-CM | POA: Diagnosis not present

## 2019-04-22 DIAGNOSIS — M1990 Unspecified osteoarthritis, unspecified site: Secondary | ICD-10-CM | POA: Diagnosis not present

## 2019-04-22 DIAGNOSIS — Z9181 History of falling: Secondary | ICD-10-CM | POA: Diagnosis not present

## 2019-04-22 DIAGNOSIS — R262 Difficulty in walking, not elsewhere classified: Secondary | ICD-10-CM | POA: Diagnosis not present

## 2019-04-22 DIAGNOSIS — M6281 Muscle weakness (generalized): Secondary | ICD-10-CM | POA: Diagnosis not present

## 2019-04-22 DIAGNOSIS — I352 Nonrheumatic aortic (valve) stenosis with insufficiency: Secondary | ICD-10-CM | POA: Diagnosis not present

## 2019-04-22 DIAGNOSIS — F0391 Unspecified dementia with behavioral disturbance: Secondary | ICD-10-CM | POA: Diagnosis not present

## 2019-04-22 DIAGNOSIS — N179 Acute kidney failure, unspecified: Secondary | ICD-10-CM | POA: Diagnosis not present

## 2019-04-22 DIAGNOSIS — R1311 Dysphagia, oral phase: Secondary | ICD-10-CM | POA: Diagnosis not present

## 2019-04-22 DIAGNOSIS — I2699 Other pulmonary embolism without acute cor pulmonale: Secondary | ICD-10-CM | POA: Diagnosis not present

## 2019-04-23 DIAGNOSIS — F0391 Unspecified dementia with behavioral disturbance: Secondary | ICD-10-CM | POA: Diagnosis not present

## 2019-04-23 DIAGNOSIS — R262 Difficulty in walking, not elsewhere classified: Secondary | ICD-10-CM | POA: Diagnosis not present

## 2019-04-23 DIAGNOSIS — I352 Nonrheumatic aortic (valve) stenosis with insufficiency: Secondary | ICD-10-CM | POA: Diagnosis not present

## 2019-04-23 DIAGNOSIS — N179 Acute kidney failure, unspecified: Secondary | ICD-10-CM | POA: Diagnosis not present

## 2019-04-23 DIAGNOSIS — M6281 Muscle weakness (generalized): Secondary | ICD-10-CM | POA: Diagnosis not present

## 2019-04-23 DIAGNOSIS — M1990 Unspecified osteoarthritis, unspecified site: Secondary | ICD-10-CM | POA: Diagnosis not present

## 2019-04-23 DIAGNOSIS — Z9181 History of falling: Secondary | ICD-10-CM | POA: Diagnosis not present

## 2019-04-23 DIAGNOSIS — I2699 Other pulmonary embolism without acute cor pulmonale: Secondary | ICD-10-CM | POA: Diagnosis not present

## 2019-04-23 DIAGNOSIS — R1311 Dysphagia, oral phase: Secondary | ICD-10-CM | POA: Diagnosis not present

## 2019-04-26 DIAGNOSIS — M1990 Unspecified osteoarthritis, unspecified site: Secondary | ICD-10-CM | POA: Diagnosis not present

## 2019-04-26 DIAGNOSIS — I2699 Other pulmonary embolism without acute cor pulmonale: Secondary | ICD-10-CM | POA: Diagnosis not present

## 2019-04-26 DIAGNOSIS — F0391 Unspecified dementia with behavioral disturbance: Secondary | ICD-10-CM | POA: Diagnosis not present

## 2019-04-26 DIAGNOSIS — R262 Difficulty in walking, not elsewhere classified: Secondary | ICD-10-CM | POA: Diagnosis not present

## 2019-04-26 DIAGNOSIS — R1311 Dysphagia, oral phase: Secondary | ICD-10-CM | POA: Diagnosis not present

## 2019-04-26 DIAGNOSIS — N179 Acute kidney failure, unspecified: Secondary | ICD-10-CM | POA: Diagnosis not present

## 2019-04-26 DIAGNOSIS — I352 Nonrheumatic aortic (valve) stenosis with insufficiency: Secondary | ICD-10-CM | POA: Diagnosis not present

## 2019-04-26 DIAGNOSIS — Z9181 History of falling: Secondary | ICD-10-CM | POA: Diagnosis not present

## 2019-04-26 DIAGNOSIS — M6281 Muscle weakness (generalized): Secondary | ICD-10-CM | POA: Diagnosis not present

## 2019-04-27 DIAGNOSIS — R262 Difficulty in walking, not elsewhere classified: Secondary | ICD-10-CM | POA: Diagnosis not present

## 2019-04-27 DIAGNOSIS — I352 Nonrheumatic aortic (valve) stenosis with insufficiency: Secondary | ICD-10-CM | POA: Diagnosis not present

## 2019-04-27 DIAGNOSIS — M1990 Unspecified osteoarthritis, unspecified site: Secondary | ICD-10-CM | POA: Diagnosis not present

## 2019-04-27 DIAGNOSIS — Z9181 History of falling: Secondary | ICD-10-CM | POA: Diagnosis not present

## 2019-04-27 DIAGNOSIS — I2699 Other pulmonary embolism without acute cor pulmonale: Secondary | ICD-10-CM | POA: Diagnosis not present

## 2019-04-27 DIAGNOSIS — N179 Acute kidney failure, unspecified: Secondary | ICD-10-CM | POA: Diagnosis not present

## 2019-04-27 DIAGNOSIS — M6281 Muscle weakness (generalized): Secondary | ICD-10-CM | POA: Diagnosis not present

## 2019-04-27 DIAGNOSIS — F0391 Unspecified dementia with behavioral disturbance: Secondary | ICD-10-CM | POA: Diagnosis not present

## 2019-04-27 DIAGNOSIS — R1311 Dysphagia, oral phase: Secondary | ICD-10-CM | POA: Diagnosis not present

## 2019-04-28 DIAGNOSIS — M1990 Unspecified osteoarthritis, unspecified site: Secondary | ICD-10-CM | POA: Diagnosis not present

## 2019-04-28 DIAGNOSIS — I2699 Other pulmonary embolism without acute cor pulmonale: Secondary | ICD-10-CM | POA: Diagnosis not present

## 2019-04-28 DIAGNOSIS — I352 Nonrheumatic aortic (valve) stenosis with insufficiency: Secondary | ICD-10-CM | POA: Diagnosis not present

## 2019-04-28 DIAGNOSIS — Z9181 History of falling: Secondary | ICD-10-CM | POA: Diagnosis not present

## 2019-04-28 DIAGNOSIS — R1311 Dysphagia, oral phase: Secondary | ICD-10-CM | POA: Diagnosis not present

## 2019-04-28 DIAGNOSIS — N179 Acute kidney failure, unspecified: Secondary | ICD-10-CM | POA: Diagnosis not present

## 2019-04-28 DIAGNOSIS — F0391 Unspecified dementia with behavioral disturbance: Secondary | ICD-10-CM | POA: Diagnosis not present

## 2019-04-28 DIAGNOSIS — M6281 Muscle weakness (generalized): Secondary | ICD-10-CM | POA: Diagnosis not present

## 2019-04-28 DIAGNOSIS — R262 Difficulty in walking, not elsewhere classified: Secondary | ICD-10-CM | POA: Diagnosis not present

## 2019-04-29 DIAGNOSIS — N179 Acute kidney failure, unspecified: Secondary | ICD-10-CM | POA: Diagnosis not present

## 2019-04-29 DIAGNOSIS — I2699 Other pulmonary embolism without acute cor pulmonale: Secondary | ICD-10-CM | POA: Diagnosis not present

## 2019-04-29 DIAGNOSIS — F0391 Unspecified dementia with behavioral disturbance: Secondary | ICD-10-CM | POA: Diagnosis not present

## 2019-04-29 DIAGNOSIS — R1311 Dysphagia, oral phase: Secondary | ICD-10-CM | POA: Diagnosis not present

## 2019-04-29 DIAGNOSIS — R262 Difficulty in walking, not elsewhere classified: Secondary | ICD-10-CM | POA: Diagnosis not present

## 2019-04-29 DIAGNOSIS — Z9181 History of falling: Secondary | ICD-10-CM | POA: Diagnosis not present

## 2019-04-29 DIAGNOSIS — I352 Nonrheumatic aortic (valve) stenosis with insufficiency: Secondary | ICD-10-CM | POA: Diagnosis not present

## 2019-04-29 DIAGNOSIS — M1990 Unspecified osteoarthritis, unspecified site: Secondary | ICD-10-CM | POA: Diagnosis not present

## 2019-04-29 DIAGNOSIS — M6281 Muscle weakness (generalized): Secondary | ICD-10-CM | POA: Diagnosis not present

## 2019-04-30 DIAGNOSIS — R1311 Dysphagia, oral phase: Secondary | ICD-10-CM | POA: Diagnosis not present

## 2019-04-30 DIAGNOSIS — I352 Nonrheumatic aortic (valve) stenosis with insufficiency: Secondary | ICD-10-CM | POA: Diagnosis not present

## 2019-04-30 DIAGNOSIS — N179 Acute kidney failure, unspecified: Secondary | ICD-10-CM | POA: Diagnosis not present

## 2019-04-30 DIAGNOSIS — M1990 Unspecified osteoarthritis, unspecified site: Secondary | ICD-10-CM | POA: Diagnosis not present

## 2019-04-30 DIAGNOSIS — Z9181 History of falling: Secondary | ICD-10-CM | POA: Diagnosis not present

## 2019-04-30 DIAGNOSIS — M6281 Muscle weakness (generalized): Secondary | ICD-10-CM | POA: Diagnosis not present

## 2019-04-30 DIAGNOSIS — I2699 Other pulmonary embolism without acute cor pulmonale: Secondary | ICD-10-CM | POA: Diagnosis not present

## 2019-04-30 DIAGNOSIS — R262 Difficulty in walking, not elsewhere classified: Secondary | ICD-10-CM | POA: Diagnosis not present

## 2019-04-30 DIAGNOSIS — F0391 Unspecified dementia with behavioral disturbance: Secondary | ICD-10-CM | POA: Diagnosis not present

## 2019-05-03 DIAGNOSIS — F0391 Unspecified dementia with behavioral disturbance: Secondary | ICD-10-CM | POA: Diagnosis not present

## 2019-05-03 DIAGNOSIS — I2699 Other pulmonary embolism without acute cor pulmonale: Secondary | ICD-10-CM | POA: Diagnosis not present

## 2019-05-03 DIAGNOSIS — I352 Nonrheumatic aortic (valve) stenosis with insufficiency: Secondary | ICD-10-CM | POA: Diagnosis not present

## 2019-05-03 DIAGNOSIS — M1990 Unspecified osteoarthritis, unspecified site: Secondary | ICD-10-CM | POA: Diagnosis not present

## 2019-05-03 DIAGNOSIS — Z9181 History of falling: Secondary | ICD-10-CM | POA: Diagnosis not present

## 2019-05-03 DIAGNOSIS — N179 Acute kidney failure, unspecified: Secondary | ICD-10-CM | POA: Diagnosis not present

## 2019-05-03 DIAGNOSIS — R262 Difficulty in walking, not elsewhere classified: Secondary | ICD-10-CM | POA: Diagnosis not present

## 2019-05-03 DIAGNOSIS — R1311 Dysphagia, oral phase: Secondary | ICD-10-CM | POA: Diagnosis not present

## 2019-05-03 DIAGNOSIS — M6281 Muscle weakness (generalized): Secondary | ICD-10-CM | POA: Diagnosis not present

## 2019-05-04 DIAGNOSIS — R262 Difficulty in walking, not elsewhere classified: Secondary | ICD-10-CM | POA: Diagnosis not present

## 2019-05-04 DIAGNOSIS — F0391 Unspecified dementia with behavioral disturbance: Secondary | ICD-10-CM | POA: Diagnosis not present

## 2019-05-04 DIAGNOSIS — R1311 Dysphagia, oral phase: Secondary | ICD-10-CM | POA: Diagnosis not present

## 2019-05-04 DIAGNOSIS — M6281 Muscle weakness (generalized): Secondary | ICD-10-CM | POA: Diagnosis not present

## 2019-05-04 DIAGNOSIS — I352 Nonrheumatic aortic (valve) stenosis with insufficiency: Secondary | ICD-10-CM | POA: Diagnosis not present

## 2019-05-04 DIAGNOSIS — Z9181 History of falling: Secondary | ICD-10-CM | POA: Diagnosis not present

## 2019-05-04 DIAGNOSIS — M1990 Unspecified osteoarthritis, unspecified site: Secondary | ICD-10-CM | POA: Diagnosis not present

## 2019-05-04 DIAGNOSIS — N179 Acute kidney failure, unspecified: Secondary | ICD-10-CM | POA: Diagnosis not present

## 2019-05-04 DIAGNOSIS — I2699 Other pulmonary embolism without acute cor pulmonale: Secondary | ICD-10-CM | POA: Diagnosis not present

## 2019-05-05 DIAGNOSIS — D649 Anemia, unspecified: Secondary | ICD-10-CM | POA: Diagnosis not present

## 2019-05-05 DIAGNOSIS — F0390 Unspecified dementia without behavioral disturbance: Secondary | ICD-10-CM | POA: Diagnosis not present

## 2019-05-05 DIAGNOSIS — I352 Nonrheumatic aortic (valve) stenosis with insufficiency: Secondary | ICD-10-CM | POA: Diagnosis not present

## 2019-05-05 DIAGNOSIS — R1311 Dysphagia, oral phase: Secondary | ICD-10-CM | POA: Diagnosis not present

## 2019-05-05 DIAGNOSIS — M6281 Muscle weakness (generalized): Secondary | ICD-10-CM | POA: Diagnosis not present

## 2019-05-05 DIAGNOSIS — M1990 Unspecified osteoarthritis, unspecified site: Secondary | ICD-10-CM | POA: Diagnosis not present

## 2019-05-05 DIAGNOSIS — F0391 Unspecified dementia with behavioral disturbance: Secondary | ICD-10-CM | POA: Diagnosis not present

## 2019-05-05 DIAGNOSIS — I2699 Other pulmonary embolism without acute cor pulmonale: Secondary | ICD-10-CM | POA: Diagnosis not present

## 2019-05-05 DIAGNOSIS — I10 Essential (primary) hypertension: Secondary | ICD-10-CM | POA: Diagnosis not present

## 2019-05-05 DIAGNOSIS — R262 Difficulty in walking, not elsewhere classified: Secondary | ICD-10-CM | POA: Diagnosis not present

## 2019-05-05 DIAGNOSIS — N179 Acute kidney failure, unspecified: Secondary | ICD-10-CM | POA: Diagnosis not present

## 2019-05-05 DIAGNOSIS — Z9181 History of falling: Secondary | ICD-10-CM | POA: Diagnosis not present

## 2019-05-06 DIAGNOSIS — I352 Nonrheumatic aortic (valve) stenosis with insufficiency: Secondary | ICD-10-CM | POA: Diagnosis not present

## 2019-05-06 DIAGNOSIS — M1990 Unspecified osteoarthritis, unspecified site: Secondary | ICD-10-CM | POA: Diagnosis not present

## 2019-05-06 DIAGNOSIS — Z9181 History of falling: Secondary | ICD-10-CM | POA: Diagnosis not present

## 2019-05-06 DIAGNOSIS — F0391 Unspecified dementia with behavioral disturbance: Secondary | ICD-10-CM | POA: Diagnosis not present

## 2019-05-06 DIAGNOSIS — R262 Difficulty in walking, not elsewhere classified: Secondary | ICD-10-CM | POA: Diagnosis not present

## 2019-05-06 DIAGNOSIS — N179 Acute kidney failure, unspecified: Secondary | ICD-10-CM | POA: Diagnosis not present

## 2019-05-06 DIAGNOSIS — I2699 Other pulmonary embolism without acute cor pulmonale: Secondary | ICD-10-CM | POA: Diagnosis not present

## 2019-05-06 DIAGNOSIS — M6281 Muscle weakness (generalized): Secondary | ICD-10-CM | POA: Diagnosis not present

## 2019-05-06 DIAGNOSIS — R1311 Dysphagia, oral phase: Secondary | ICD-10-CM | POA: Diagnosis not present

## 2019-05-07 DIAGNOSIS — R262 Difficulty in walking, not elsewhere classified: Secondary | ICD-10-CM | POA: Diagnosis not present

## 2019-05-07 DIAGNOSIS — I2699 Other pulmonary embolism without acute cor pulmonale: Secondary | ICD-10-CM | POA: Diagnosis not present

## 2019-05-07 DIAGNOSIS — Z9181 History of falling: Secondary | ICD-10-CM | POA: Diagnosis not present

## 2019-05-07 DIAGNOSIS — R1311 Dysphagia, oral phase: Secondary | ICD-10-CM | POA: Diagnosis not present

## 2019-05-07 DIAGNOSIS — N179 Acute kidney failure, unspecified: Secondary | ICD-10-CM | POA: Diagnosis not present

## 2019-05-07 DIAGNOSIS — M6281 Muscle weakness (generalized): Secondary | ICD-10-CM | POA: Diagnosis not present

## 2019-05-07 DIAGNOSIS — M1990 Unspecified osteoarthritis, unspecified site: Secondary | ICD-10-CM | POA: Diagnosis not present

## 2019-05-07 DIAGNOSIS — F0391 Unspecified dementia with behavioral disturbance: Secondary | ICD-10-CM | POA: Diagnosis not present

## 2019-05-07 DIAGNOSIS — I352 Nonrheumatic aortic (valve) stenosis with insufficiency: Secondary | ICD-10-CM | POA: Diagnosis not present

## 2019-05-15 DIAGNOSIS — D508 Other iron deficiency anemias: Secondary | ICD-10-CM | POA: Diagnosis not present

## 2019-05-15 DIAGNOSIS — D696 Thrombocytopenia, unspecified: Secondary | ICD-10-CM | POA: Diagnosis not present

## 2019-05-15 DIAGNOSIS — M6281 Muscle weakness (generalized): Secondary | ICD-10-CM | POA: Diagnosis not present

## 2019-05-15 DIAGNOSIS — I10 Essential (primary) hypertension: Secondary | ICD-10-CM | POA: Diagnosis not present

## 2019-05-24 DIAGNOSIS — H524 Presbyopia: Secondary | ICD-10-CM | POA: Diagnosis not present

## 2019-05-24 DIAGNOSIS — Z961 Presence of intraocular lens: Secondary | ICD-10-CM | POA: Diagnosis not present

## 2019-05-24 DIAGNOSIS — H401134 Primary open-angle glaucoma, bilateral, indeterminate stage: Secondary | ICD-10-CM | POA: Diagnosis not present

## 2019-05-24 DIAGNOSIS — H26491 Other secondary cataract, right eye: Secondary | ICD-10-CM | POA: Diagnosis not present

## 2019-05-24 DIAGNOSIS — H04123 Dry eye syndrome of bilateral lacrimal glands: Secondary | ICD-10-CM | POA: Diagnosis not present

## 2019-06-07 DIAGNOSIS — F0390 Unspecified dementia without behavioral disturbance: Secondary | ICD-10-CM | POA: Diagnosis not present

## 2019-06-07 DIAGNOSIS — F419 Anxiety disorder, unspecified: Secondary | ICD-10-CM | POA: Diagnosis not present

## 2019-06-08 DIAGNOSIS — F0390 Unspecified dementia without behavioral disturbance: Secondary | ICD-10-CM | POA: Diagnosis not present

## 2019-06-08 DIAGNOSIS — I10 Essential (primary) hypertension: Secondary | ICD-10-CM | POA: Diagnosis not present

## 2019-06-08 DIAGNOSIS — D649 Anemia, unspecified: Secondary | ICD-10-CM | POA: Diagnosis not present

## 2019-06-08 DIAGNOSIS — M6281 Muscle weakness (generalized): Secondary | ICD-10-CM | POA: Diagnosis not present

## 2019-06-19 DIAGNOSIS — I10 Essential (primary) hypertension: Secondary | ICD-10-CM | POA: Diagnosis not present

## 2019-06-19 DIAGNOSIS — M6281 Muscle weakness (generalized): Secondary | ICD-10-CM | POA: Diagnosis not present

## 2019-06-19 DIAGNOSIS — D696 Thrombocytopenia, unspecified: Secondary | ICD-10-CM | POA: Diagnosis not present

## 2019-06-19 DIAGNOSIS — D508 Other iron deficiency anemias: Secondary | ICD-10-CM | POA: Diagnosis not present

## 2019-07-08 DIAGNOSIS — E43 Unspecified severe protein-calorie malnutrition: Secondary | ICD-10-CM | POA: Diagnosis not present

## 2019-07-08 DIAGNOSIS — I10 Essential (primary) hypertension: Secondary | ICD-10-CM | POA: Diagnosis not present

## 2019-07-08 DIAGNOSIS — C50919 Malignant neoplasm of unspecified site of unspecified female breast: Secondary | ICD-10-CM | POA: Diagnosis not present

## 2019-07-08 DIAGNOSIS — F331 Major depressive disorder, recurrent, moderate: Secondary | ICD-10-CM | POA: Diagnosis not present

## 2019-07-25 DIAGNOSIS — D649 Anemia, unspecified: Secondary | ICD-10-CM | POA: Diagnosis not present

## 2019-07-25 DIAGNOSIS — I10 Essential (primary) hypertension: Secondary | ICD-10-CM | POA: Diagnosis not present

## 2019-07-25 DIAGNOSIS — M6281 Muscle weakness (generalized): Secondary | ICD-10-CM | POA: Diagnosis not present

## 2019-08-05 DIAGNOSIS — I4891 Unspecified atrial fibrillation: Secondary | ICD-10-CM | POA: Diagnosis not present

## 2019-08-05 DIAGNOSIS — I350 Nonrheumatic aortic (valve) stenosis: Secondary | ICD-10-CM | POA: Diagnosis not present

## 2019-08-05 DIAGNOSIS — C50919 Malignant neoplasm of unspecified site of unspecified female breast: Secondary | ICD-10-CM | POA: Diagnosis not present

## 2019-08-05 DIAGNOSIS — M790 Rheumatism, unspecified: Secondary | ICD-10-CM | POA: Diagnosis not present

## 2019-08-06 DIAGNOSIS — R300 Dysuria: Secondary | ICD-10-CM | POA: Diagnosis not present

## 2019-08-06 DIAGNOSIS — R319 Hematuria, unspecified: Secondary | ICD-10-CM | POA: Diagnosis not present

## 2019-08-06 DIAGNOSIS — N390 Urinary tract infection, site not specified: Secondary | ICD-10-CM | POA: Diagnosis not present

## 2019-08-06 DIAGNOSIS — Z79899 Other long term (current) drug therapy: Secondary | ICD-10-CM | POA: Diagnosis not present

## 2019-08-08 DIAGNOSIS — I10 Essential (primary) hypertension: Secondary | ICD-10-CM | POA: Diagnosis not present

## 2019-08-08 DIAGNOSIS — R319 Hematuria, unspecified: Secondary | ICD-10-CM | POA: Diagnosis not present

## 2019-08-08 DIAGNOSIS — N390 Urinary tract infection, site not specified: Secondary | ICD-10-CM | POA: Diagnosis not present

## 2019-08-08 DIAGNOSIS — D649 Anemia, unspecified: Secondary | ICD-10-CM | POA: Diagnosis not present

## 2019-08-09 DIAGNOSIS — Z79899 Other long term (current) drug therapy: Secondary | ICD-10-CM | POA: Diagnosis not present

## 2019-08-09 DIAGNOSIS — E785 Hyperlipidemia, unspecified: Secondary | ICD-10-CM | POA: Diagnosis not present

## 2019-08-10 DIAGNOSIS — N390 Urinary tract infection, site not specified: Secondary | ICD-10-CM | POA: Diagnosis not present

## 2019-08-10 DIAGNOSIS — Z79899 Other long term (current) drug therapy: Secondary | ICD-10-CM | POA: Diagnosis not present

## 2019-08-10 DIAGNOSIS — D649 Anemia, unspecified: Secondary | ICD-10-CM | POA: Diagnosis not present

## 2019-08-16 DIAGNOSIS — F419 Anxiety disorder, unspecified: Secondary | ICD-10-CM | POA: Diagnosis not present

## 2019-08-16 DIAGNOSIS — F330 Major depressive disorder, recurrent, mild: Secondary | ICD-10-CM | POA: Diagnosis not present

## 2019-08-16 DIAGNOSIS — F0390 Unspecified dementia without behavioral disturbance: Secondary | ICD-10-CM | POA: Diagnosis not present

## 2019-08-19 DIAGNOSIS — I10 Essential (primary) hypertension: Secondary | ICD-10-CM | POA: Diagnosis not present

## 2019-08-19 DIAGNOSIS — M6281 Muscle weakness (generalized): Secondary | ICD-10-CM | POA: Diagnosis not present

## 2019-08-19 DIAGNOSIS — M1990 Unspecified osteoarthritis, unspecified site: Secondary | ICD-10-CM | POA: Diagnosis not present

## 2019-08-19 DIAGNOSIS — D649 Anemia, unspecified: Secondary | ICD-10-CM | POA: Diagnosis not present

## 2019-09-02 DIAGNOSIS — H04123 Dry eye syndrome of bilateral lacrimal glands: Secondary | ICD-10-CM | POA: Diagnosis not present

## 2019-09-02 DIAGNOSIS — I4891 Unspecified atrial fibrillation: Secondary | ICD-10-CM | POA: Diagnosis not present

## 2019-09-02 DIAGNOSIS — I10 Essential (primary) hypertension: Secondary | ICD-10-CM | POA: Diagnosis not present

## 2019-09-02 DIAGNOSIS — H409 Unspecified glaucoma: Secondary | ICD-10-CM | POA: Diagnosis not present

## 2019-09-08 DIAGNOSIS — E785 Hyperlipidemia, unspecified: Secondary | ICD-10-CM | POA: Diagnosis not present

## 2019-09-08 DIAGNOSIS — E7800 Pure hypercholesterolemia, unspecified: Secondary | ICD-10-CM | POA: Diagnosis not present

## 2019-09-17 DIAGNOSIS — F419 Anxiety disorder, unspecified: Secondary | ICD-10-CM | POA: Diagnosis not present

## 2019-09-17 DIAGNOSIS — F0390 Unspecified dementia without behavioral disturbance: Secondary | ICD-10-CM | POA: Diagnosis not present

## 2019-09-17 DIAGNOSIS — F330 Major depressive disorder, recurrent, mild: Secondary | ICD-10-CM | POA: Diagnosis not present

## 2019-09-22 DIAGNOSIS — M6281 Muscle weakness (generalized): Secondary | ICD-10-CM | POA: Diagnosis not present

## 2019-09-22 DIAGNOSIS — Z9181 History of falling: Secondary | ICD-10-CM | POA: Diagnosis not present

## 2019-09-22 DIAGNOSIS — R262 Difficulty in walking, not elsewhere classified: Secondary | ICD-10-CM | POA: Diagnosis not present

## 2019-09-22 DIAGNOSIS — I2699 Other pulmonary embolism without acute cor pulmonale: Secondary | ICD-10-CM | POA: Diagnosis not present

## 2019-09-22 DIAGNOSIS — F0391 Unspecified dementia with behavioral disturbance: Secondary | ICD-10-CM | POA: Diagnosis not present

## 2019-09-22 DIAGNOSIS — R1311 Dysphagia, oral phase: Secondary | ICD-10-CM | POA: Diagnosis not present

## 2019-09-22 DIAGNOSIS — M1990 Unspecified osteoarthritis, unspecified site: Secondary | ICD-10-CM | POA: Diagnosis not present

## 2019-09-22 DIAGNOSIS — I352 Nonrheumatic aortic (valve) stenosis with insufficiency: Secondary | ICD-10-CM | POA: Diagnosis not present

## 2019-09-22 DIAGNOSIS — N179 Acute kidney failure, unspecified: Secondary | ICD-10-CM | POA: Diagnosis not present

## 2019-09-24 DIAGNOSIS — N179 Acute kidney failure, unspecified: Secondary | ICD-10-CM | POA: Diagnosis not present

## 2019-09-24 DIAGNOSIS — M6281 Muscle weakness (generalized): Secondary | ICD-10-CM | POA: Diagnosis not present

## 2019-09-24 DIAGNOSIS — F0391 Unspecified dementia with behavioral disturbance: Secondary | ICD-10-CM | POA: Diagnosis not present

## 2019-09-24 DIAGNOSIS — M1990 Unspecified osteoarthritis, unspecified site: Secondary | ICD-10-CM | POA: Diagnosis not present

## 2019-09-24 DIAGNOSIS — R262 Difficulty in walking, not elsewhere classified: Secondary | ICD-10-CM | POA: Diagnosis not present

## 2019-09-24 DIAGNOSIS — R1311 Dysphagia, oral phase: Secondary | ICD-10-CM | POA: Diagnosis not present

## 2019-09-24 DIAGNOSIS — I2699 Other pulmonary embolism without acute cor pulmonale: Secondary | ICD-10-CM | POA: Diagnosis not present

## 2019-09-24 DIAGNOSIS — Z9181 History of falling: Secondary | ICD-10-CM | POA: Diagnosis not present

## 2019-09-24 DIAGNOSIS — I352 Nonrheumatic aortic (valve) stenosis with insufficiency: Secondary | ICD-10-CM | POA: Diagnosis not present

## 2019-09-26 DIAGNOSIS — I2699 Other pulmonary embolism without acute cor pulmonale: Secondary | ICD-10-CM | POA: Diagnosis not present

## 2019-09-26 DIAGNOSIS — M1990 Unspecified osteoarthritis, unspecified site: Secondary | ICD-10-CM | POA: Diagnosis not present

## 2019-09-26 DIAGNOSIS — Z9181 History of falling: Secondary | ICD-10-CM | POA: Diagnosis not present

## 2019-09-26 DIAGNOSIS — R262 Difficulty in walking, not elsewhere classified: Secondary | ICD-10-CM | POA: Diagnosis not present

## 2019-09-26 DIAGNOSIS — M6281 Muscle weakness (generalized): Secondary | ICD-10-CM | POA: Diagnosis not present

## 2019-09-26 DIAGNOSIS — I352 Nonrheumatic aortic (valve) stenosis with insufficiency: Secondary | ICD-10-CM | POA: Diagnosis not present

## 2019-09-26 DIAGNOSIS — R1311 Dysphagia, oral phase: Secondary | ICD-10-CM | POA: Diagnosis not present

## 2019-09-26 DIAGNOSIS — N179 Acute kidney failure, unspecified: Secondary | ICD-10-CM | POA: Diagnosis not present

## 2019-09-26 DIAGNOSIS — F0391 Unspecified dementia with behavioral disturbance: Secondary | ICD-10-CM | POA: Diagnosis not present

## 2019-09-27 DIAGNOSIS — I2699 Other pulmonary embolism without acute cor pulmonale: Secondary | ICD-10-CM | POA: Diagnosis not present

## 2019-09-27 DIAGNOSIS — M6281 Muscle weakness (generalized): Secondary | ICD-10-CM | POA: Diagnosis not present

## 2019-09-27 DIAGNOSIS — I352 Nonrheumatic aortic (valve) stenosis with insufficiency: Secondary | ICD-10-CM | POA: Diagnosis not present

## 2019-09-27 DIAGNOSIS — R1311 Dysphagia, oral phase: Secondary | ICD-10-CM | POA: Diagnosis not present

## 2019-09-27 DIAGNOSIS — F0391 Unspecified dementia with behavioral disturbance: Secondary | ICD-10-CM | POA: Diagnosis not present

## 2019-09-27 DIAGNOSIS — N179 Acute kidney failure, unspecified: Secondary | ICD-10-CM | POA: Diagnosis not present

## 2019-09-27 DIAGNOSIS — M1990 Unspecified osteoarthritis, unspecified site: Secondary | ICD-10-CM | POA: Diagnosis not present

## 2019-09-27 DIAGNOSIS — Z9181 History of falling: Secondary | ICD-10-CM | POA: Diagnosis not present

## 2019-09-27 DIAGNOSIS — R262 Difficulty in walking, not elsewhere classified: Secondary | ICD-10-CM | POA: Diagnosis not present

## 2019-09-28 DIAGNOSIS — F0391 Unspecified dementia with behavioral disturbance: Secondary | ICD-10-CM | POA: Diagnosis not present

## 2019-09-28 DIAGNOSIS — I2699 Other pulmonary embolism without acute cor pulmonale: Secondary | ICD-10-CM | POA: Diagnosis not present

## 2019-09-28 DIAGNOSIS — M1990 Unspecified osteoarthritis, unspecified site: Secondary | ICD-10-CM | POA: Diagnosis not present

## 2019-09-28 DIAGNOSIS — N179 Acute kidney failure, unspecified: Secondary | ICD-10-CM | POA: Diagnosis not present

## 2019-09-28 DIAGNOSIS — M6281 Muscle weakness (generalized): Secondary | ICD-10-CM | POA: Diagnosis not present

## 2019-09-28 DIAGNOSIS — Z9181 History of falling: Secondary | ICD-10-CM | POA: Diagnosis not present

## 2019-09-28 DIAGNOSIS — R262 Difficulty in walking, not elsewhere classified: Secondary | ICD-10-CM | POA: Diagnosis not present

## 2019-09-28 DIAGNOSIS — R1311 Dysphagia, oral phase: Secondary | ICD-10-CM | POA: Diagnosis not present

## 2019-09-28 DIAGNOSIS — I352 Nonrheumatic aortic (valve) stenosis with insufficiency: Secondary | ICD-10-CM | POA: Diagnosis not present

## 2019-09-29 DIAGNOSIS — M1990 Unspecified osteoarthritis, unspecified site: Secondary | ICD-10-CM | POA: Diagnosis not present

## 2019-09-29 DIAGNOSIS — R1311 Dysphagia, oral phase: Secondary | ICD-10-CM | POA: Diagnosis not present

## 2019-09-29 DIAGNOSIS — Z9181 History of falling: Secondary | ICD-10-CM | POA: Diagnosis not present

## 2019-09-29 DIAGNOSIS — R262 Difficulty in walking, not elsewhere classified: Secondary | ICD-10-CM | POA: Diagnosis not present

## 2019-09-29 DIAGNOSIS — M6281 Muscle weakness (generalized): Secondary | ICD-10-CM | POA: Diagnosis not present

## 2019-09-29 DIAGNOSIS — I2699 Other pulmonary embolism without acute cor pulmonale: Secondary | ICD-10-CM | POA: Diagnosis not present

## 2019-09-29 DIAGNOSIS — I352 Nonrheumatic aortic (valve) stenosis with insufficiency: Secondary | ICD-10-CM | POA: Diagnosis not present

## 2019-09-29 DIAGNOSIS — F0391 Unspecified dementia with behavioral disturbance: Secondary | ICD-10-CM | POA: Diagnosis not present

## 2019-09-29 DIAGNOSIS — N179 Acute kidney failure, unspecified: Secondary | ICD-10-CM | POA: Diagnosis not present

## 2019-09-30 DIAGNOSIS — Z9181 History of falling: Secondary | ICD-10-CM | POA: Diagnosis not present

## 2019-09-30 DIAGNOSIS — I10 Essential (primary) hypertension: Secondary | ICD-10-CM | POA: Diagnosis not present

## 2019-09-30 DIAGNOSIS — I2699 Other pulmonary embolism without acute cor pulmonale: Secondary | ICD-10-CM | POA: Diagnosis not present

## 2019-09-30 DIAGNOSIS — M6281 Muscle weakness (generalized): Secondary | ICD-10-CM | POA: Diagnosis not present

## 2019-09-30 DIAGNOSIS — F0391 Unspecified dementia with behavioral disturbance: Secondary | ICD-10-CM | POA: Diagnosis not present

## 2019-09-30 DIAGNOSIS — F0390 Unspecified dementia without behavioral disturbance: Secondary | ICD-10-CM | POA: Diagnosis not present

## 2019-09-30 DIAGNOSIS — I4891 Unspecified atrial fibrillation: Secondary | ICD-10-CM | POA: Diagnosis not present

## 2019-09-30 DIAGNOSIS — R1311 Dysphagia, oral phase: Secondary | ICD-10-CM | POA: Diagnosis not present

## 2019-09-30 DIAGNOSIS — M1990 Unspecified osteoarthritis, unspecified site: Secondary | ICD-10-CM | POA: Diagnosis not present

## 2019-09-30 DIAGNOSIS — N179 Acute kidney failure, unspecified: Secondary | ICD-10-CM | POA: Diagnosis not present

## 2019-09-30 DIAGNOSIS — R262 Difficulty in walking, not elsewhere classified: Secondary | ICD-10-CM | POA: Diagnosis not present

## 2019-09-30 DIAGNOSIS — I352 Nonrheumatic aortic (valve) stenosis with insufficiency: Secondary | ICD-10-CM | POA: Diagnosis not present

## 2019-10-01 DIAGNOSIS — M1990 Unspecified osteoarthritis, unspecified site: Secondary | ICD-10-CM | POA: Diagnosis not present

## 2019-10-01 DIAGNOSIS — R1311 Dysphagia, oral phase: Secondary | ICD-10-CM | POA: Diagnosis not present

## 2019-10-01 DIAGNOSIS — M6281 Muscle weakness (generalized): Secondary | ICD-10-CM | POA: Diagnosis not present

## 2019-10-01 DIAGNOSIS — R262 Difficulty in walking, not elsewhere classified: Secondary | ICD-10-CM | POA: Diagnosis not present

## 2019-10-01 DIAGNOSIS — F0391 Unspecified dementia with behavioral disturbance: Secondary | ICD-10-CM | POA: Diagnosis not present

## 2019-10-01 DIAGNOSIS — Z9181 History of falling: Secondary | ICD-10-CM | POA: Diagnosis not present

## 2019-10-01 DIAGNOSIS — I2699 Other pulmonary embolism without acute cor pulmonale: Secondary | ICD-10-CM | POA: Diagnosis not present

## 2019-10-01 DIAGNOSIS — I352 Nonrheumatic aortic (valve) stenosis with insufficiency: Secondary | ICD-10-CM | POA: Diagnosis not present

## 2019-10-01 DIAGNOSIS — N179 Acute kidney failure, unspecified: Secondary | ICD-10-CM | POA: Diagnosis not present

## 2019-10-03 DIAGNOSIS — I352 Nonrheumatic aortic (valve) stenosis with insufficiency: Secondary | ICD-10-CM | POA: Diagnosis not present

## 2019-10-03 DIAGNOSIS — F0391 Unspecified dementia with behavioral disturbance: Secondary | ICD-10-CM | POA: Diagnosis not present

## 2019-10-03 DIAGNOSIS — R1311 Dysphagia, oral phase: Secondary | ICD-10-CM | POA: Diagnosis not present

## 2019-10-03 DIAGNOSIS — N179 Acute kidney failure, unspecified: Secondary | ICD-10-CM | POA: Diagnosis not present

## 2019-10-03 DIAGNOSIS — R262 Difficulty in walking, not elsewhere classified: Secondary | ICD-10-CM | POA: Diagnosis not present

## 2019-10-03 DIAGNOSIS — I2699 Other pulmonary embolism without acute cor pulmonale: Secondary | ICD-10-CM | POA: Diagnosis not present

## 2019-10-03 DIAGNOSIS — M1990 Unspecified osteoarthritis, unspecified site: Secondary | ICD-10-CM | POA: Diagnosis not present

## 2019-10-03 DIAGNOSIS — M6281 Muscle weakness (generalized): Secondary | ICD-10-CM | POA: Diagnosis not present

## 2019-10-03 DIAGNOSIS — Z9181 History of falling: Secondary | ICD-10-CM | POA: Diagnosis not present

## 2019-10-04 DIAGNOSIS — I2699 Other pulmonary embolism without acute cor pulmonale: Secondary | ICD-10-CM | POA: Diagnosis not present

## 2019-10-04 DIAGNOSIS — M1990 Unspecified osteoarthritis, unspecified site: Secondary | ICD-10-CM | POA: Diagnosis not present

## 2019-10-04 DIAGNOSIS — N179 Acute kidney failure, unspecified: Secondary | ICD-10-CM | POA: Diagnosis not present

## 2019-10-04 DIAGNOSIS — R1311 Dysphagia, oral phase: Secondary | ICD-10-CM | POA: Diagnosis not present

## 2019-10-04 DIAGNOSIS — Z9181 History of falling: Secondary | ICD-10-CM | POA: Diagnosis not present

## 2019-10-04 DIAGNOSIS — M6281 Muscle weakness (generalized): Secondary | ICD-10-CM | POA: Diagnosis not present

## 2019-10-04 DIAGNOSIS — R262 Difficulty in walking, not elsewhere classified: Secondary | ICD-10-CM | POA: Diagnosis not present

## 2019-10-04 DIAGNOSIS — F0391 Unspecified dementia with behavioral disturbance: Secondary | ICD-10-CM | POA: Diagnosis not present

## 2019-10-04 DIAGNOSIS — I352 Nonrheumatic aortic (valve) stenosis with insufficiency: Secondary | ICD-10-CM | POA: Diagnosis not present

## 2019-10-05 DIAGNOSIS — I2699 Other pulmonary embolism without acute cor pulmonale: Secondary | ICD-10-CM | POA: Diagnosis not present

## 2019-10-05 DIAGNOSIS — N179 Acute kidney failure, unspecified: Secondary | ICD-10-CM | POA: Diagnosis not present

## 2019-10-05 DIAGNOSIS — Z9181 History of falling: Secondary | ICD-10-CM | POA: Diagnosis not present

## 2019-10-05 DIAGNOSIS — R1311 Dysphagia, oral phase: Secondary | ICD-10-CM | POA: Diagnosis not present

## 2019-10-05 DIAGNOSIS — M1990 Unspecified osteoarthritis, unspecified site: Secondary | ICD-10-CM | POA: Diagnosis not present

## 2019-10-05 DIAGNOSIS — F0391 Unspecified dementia with behavioral disturbance: Secondary | ICD-10-CM | POA: Diagnosis not present

## 2019-10-05 DIAGNOSIS — M6281 Muscle weakness (generalized): Secondary | ICD-10-CM | POA: Diagnosis not present

## 2019-10-05 DIAGNOSIS — R262 Difficulty in walking, not elsewhere classified: Secondary | ICD-10-CM | POA: Diagnosis not present

## 2019-10-05 DIAGNOSIS — I352 Nonrheumatic aortic (valve) stenosis with insufficiency: Secondary | ICD-10-CM | POA: Diagnosis not present

## 2019-10-06 DIAGNOSIS — M6281 Muscle weakness (generalized): Secondary | ICD-10-CM | POA: Diagnosis not present

## 2019-10-06 DIAGNOSIS — R1311 Dysphagia, oral phase: Secondary | ICD-10-CM | POA: Diagnosis not present

## 2019-10-06 DIAGNOSIS — R262 Difficulty in walking, not elsewhere classified: Secondary | ICD-10-CM | POA: Diagnosis not present

## 2019-10-06 DIAGNOSIS — N179 Acute kidney failure, unspecified: Secondary | ICD-10-CM | POA: Diagnosis not present

## 2019-10-06 DIAGNOSIS — I2699 Other pulmonary embolism without acute cor pulmonale: Secondary | ICD-10-CM | POA: Diagnosis not present

## 2019-10-06 DIAGNOSIS — M1990 Unspecified osteoarthritis, unspecified site: Secondary | ICD-10-CM | POA: Diagnosis not present

## 2019-10-06 DIAGNOSIS — Z9181 History of falling: Secondary | ICD-10-CM | POA: Diagnosis not present

## 2019-10-06 DIAGNOSIS — F0391 Unspecified dementia with behavioral disturbance: Secondary | ICD-10-CM | POA: Diagnosis not present

## 2019-10-06 DIAGNOSIS — I352 Nonrheumatic aortic (valve) stenosis with insufficiency: Secondary | ICD-10-CM | POA: Diagnosis not present

## 2019-10-07 DIAGNOSIS — Z9181 History of falling: Secondary | ICD-10-CM | POA: Diagnosis not present

## 2019-10-07 DIAGNOSIS — R1311 Dysphagia, oral phase: Secondary | ICD-10-CM | POA: Diagnosis not present

## 2019-10-07 DIAGNOSIS — N179 Acute kidney failure, unspecified: Secondary | ICD-10-CM | POA: Diagnosis not present

## 2019-10-07 DIAGNOSIS — R262 Difficulty in walking, not elsewhere classified: Secondary | ICD-10-CM | POA: Diagnosis not present

## 2019-10-07 DIAGNOSIS — M1990 Unspecified osteoarthritis, unspecified site: Secondary | ICD-10-CM | POA: Diagnosis not present

## 2019-10-07 DIAGNOSIS — I2699 Other pulmonary embolism without acute cor pulmonale: Secondary | ICD-10-CM | POA: Diagnosis not present

## 2019-10-07 DIAGNOSIS — F0391 Unspecified dementia with behavioral disturbance: Secondary | ICD-10-CM | POA: Diagnosis not present

## 2019-10-07 DIAGNOSIS — I352 Nonrheumatic aortic (valve) stenosis with insufficiency: Secondary | ICD-10-CM | POA: Diagnosis not present

## 2019-10-07 DIAGNOSIS — M6281 Muscle weakness (generalized): Secondary | ICD-10-CM | POA: Diagnosis not present

## 2019-10-08 DIAGNOSIS — I352 Nonrheumatic aortic (valve) stenosis with insufficiency: Secondary | ICD-10-CM | POA: Diagnosis not present

## 2019-10-08 DIAGNOSIS — R1311 Dysphagia, oral phase: Secondary | ICD-10-CM | POA: Diagnosis not present

## 2019-10-08 DIAGNOSIS — F0391 Unspecified dementia with behavioral disturbance: Secondary | ICD-10-CM | POA: Diagnosis not present

## 2019-10-08 DIAGNOSIS — I2699 Other pulmonary embolism without acute cor pulmonale: Secondary | ICD-10-CM | POA: Diagnosis not present

## 2019-10-08 DIAGNOSIS — Z9181 History of falling: Secondary | ICD-10-CM | POA: Diagnosis not present

## 2019-10-08 DIAGNOSIS — N179 Acute kidney failure, unspecified: Secondary | ICD-10-CM | POA: Diagnosis not present

## 2019-10-08 DIAGNOSIS — M6281 Muscle weakness (generalized): Secondary | ICD-10-CM | POA: Diagnosis not present

## 2019-10-08 DIAGNOSIS — M1990 Unspecified osteoarthritis, unspecified site: Secondary | ICD-10-CM | POA: Diagnosis not present

## 2019-10-08 DIAGNOSIS — R262 Difficulty in walking, not elsewhere classified: Secondary | ICD-10-CM | POA: Diagnosis not present

## 2019-10-09 DIAGNOSIS — D649 Anemia, unspecified: Secondary | ICD-10-CM | POA: Diagnosis not present

## 2019-10-09 DIAGNOSIS — I10 Essential (primary) hypertension: Secondary | ICD-10-CM | POA: Diagnosis not present

## 2019-10-09 DIAGNOSIS — M6281 Muscle weakness (generalized): Secondary | ICD-10-CM | POA: Diagnosis not present

## 2019-10-11 DIAGNOSIS — I2699 Other pulmonary embolism without acute cor pulmonale: Secondary | ICD-10-CM | POA: Diagnosis not present

## 2019-10-11 DIAGNOSIS — R279 Unspecified lack of coordination: Secondary | ICD-10-CM | POA: Diagnosis not present

## 2019-10-11 DIAGNOSIS — I352 Nonrheumatic aortic (valve) stenosis with insufficiency: Secondary | ICD-10-CM | POA: Diagnosis not present

## 2019-10-11 DIAGNOSIS — M6281 Muscle weakness (generalized): Secondary | ICD-10-CM | POA: Diagnosis not present

## 2019-10-11 DIAGNOSIS — R262 Difficulty in walking, not elsewhere classified: Secondary | ICD-10-CM | POA: Diagnosis not present

## 2019-10-11 DIAGNOSIS — F0390 Unspecified dementia without behavioral disturbance: Secondary | ICD-10-CM | POA: Diagnosis not present

## 2019-10-12 DIAGNOSIS — M6281 Muscle weakness (generalized): Secondary | ICD-10-CM | POA: Diagnosis not present

## 2019-10-12 DIAGNOSIS — I352 Nonrheumatic aortic (valve) stenosis with insufficiency: Secondary | ICD-10-CM | POA: Diagnosis not present

## 2019-10-12 DIAGNOSIS — F0390 Unspecified dementia without behavioral disturbance: Secondary | ICD-10-CM | POA: Diagnosis not present

## 2019-10-12 DIAGNOSIS — R262 Difficulty in walking, not elsewhere classified: Secondary | ICD-10-CM | POA: Diagnosis not present

## 2019-10-12 DIAGNOSIS — R279 Unspecified lack of coordination: Secondary | ICD-10-CM | POA: Diagnosis not present

## 2019-10-12 DIAGNOSIS — I2699 Other pulmonary embolism without acute cor pulmonale: Secondary | ICD-10-CM | POA: Diagnosis not present

## 2019-10-13 DIAGNOSIS — R262 Difficulty in walking, not elsewhere classified: Secondary | ICD-10-CM | POA: Diagnosis not present

## 2019-10-13 DIAGNOSIS — F0390 Unspecified dementia without behavioral disturbance: Secondary | ICD-10-CM | POA: Diagnosis not present

## 2019-10-13 DIAGNOSIS — M6281 Muscle weakness (generalized): Secondary | ICD-10-CM | POA: Diagnosis not present

## 2019-10-13 DIAGNOSIS — R279 Unspecified lack of coordination: Secondary | ICD-10-CM | POA: Diagnosis not present

## 2019-10-13 DIAGNOSIS — I352 Nonrheumatic aortic (valve) stenosis with insufficiency: Secondary | ICD-10-CM | POA: Diagnosis not present

## 2019-10-13 DIAGNOSIS — I2699 Other pulmonary embolism without acute cor pulmonale: Secondary | ICD-10-CM | POA: Diagnosis not present

## 2019-10-14 DIAGNOSIS — R262 Difficulty in walking, not elsewhere classified: Secondary | ICD-10-CM | POA: Diagnosis not present

## 2019-10-14 DIAGNOSIS — I2699 Other pulmonary embolism without acute cor pulmonale: Secondary | ICD-10-CM | POA: Diagnosis not present

## 2019-10-14 DIAGNOSIS — M6281 Muscle weakness (generalized): Secondary | ICD-10-CM | POA: Diagnosis not present

## 2019-10-14 DIAGNOSIS — F0390 Unspecified dementia without behavioral disturbance: Secondary | ICD-10-CM | POA: Diagnosis not present

## 2019-10-14 DIAGNOSIS — I352 Nonrheumatic aortic (valve) stenosis with insufficiency: Secondary | ICD-10-CM | POA: Diagnosis not present

## 2019-10-14 DIAGNOSIS — R279 Unspecified lack of coordination: Secondary | ICD-10-CM | POA: Diagnosis not present

## 2019-10-15 DIAGNOSIS — F0390 Unspecified dementia without behavioral disturbance: Secondary | ICD-10-CM | POA: Diagnosis not present

## 2019-10-15 DIAGNOSIS — I352 Nonrheumatic aortic (valve) stenosis with insufficiency: Secondary | ICD-10-CM | POA: Diagnosis not present

## 2019-10-15 DIAGNOSIS — M6281 Muscle weakness (generalized): Secondary | ICD-10-CM | POA: Diagnosis not present

## 2019-10-15 DIAGNOSIS — R279 Unspecified lack of coordination: Secondary | ICD-10-CM | POA: Diagnosis not present

## 2019-10-15 DIAGNOSIS — I2699 Other pulmonary embolism without acute cor pulmonale: Secondary | ICD-10-CM | POA: Diagnosis not present

## 2019-10-15 DIAGNOSIS — R262 Difficulty in walking, not elsewhere classified: Secondary | ICD-10-CM | POA: Diagnosis not present

## 2019-10-17 DIAGNOSIS — M6281 Muscle weakness (generalized): Secondary | ICD-10-CM | POA: Diagnosis not present

## 2019-10-17 DIAGNOSIS — I352 Nonrheumatic aortic (valve) stenosis with insufficiency: Secondary | ICD-10-CM | POA: Diagnosis not present

## 2019-10-17 DIAGNOSIS — R279 Unspecified lack of coordination: Secondary | ICD-10-CM | POA: Diagnosis not present

## 2019-10-17 DIAGNOSIS — F0390 Unspecified dementia without behavioral disturbance: Secondary | ICD-10-CM | POA: Diagnosis not present

## 2019-10-17 DIAGNOSIS — I2699 Other pulmonary embolism without acute cor pulmonale: Secondary | ICD-10-CM | POA: Diagnosis not present

## 2019-10-17 DIAGNOSIS — R262 Difficulty in walking, not elsewhere classified: Secondary | ICD-10-CM | POA: Diagnosis not present

## 2019-10-18 DIAGNOSIS — I2699 Other pulmonary embolism without acute cor pulmonale: Secondary | ICD-10-CM | POA: Diagnosis not present

## 2019-10-18 DIAGNOSIS — R262 Difficulty in walking, not elsewhere classified: Secondary | ICD-10-CM | POA: Diagnosis not present

## 2019-10-18 DIAGNOSIS — M6281 Muscle weakness (generalized): Secondary | ICD-10-CM | POA: Diagnosis not present

## 2019-10-18 DIAGNOSIS — F0390 Unspecified dementia without behavioral disturbance: Secondary | ICD-10-CM | POA: Diagnosis not present

## 2019-10-18 DIAGNOSIS — I352 Nonrheumatic aortic (valve) stenosis with insufficiency: Secondary | ICD-10-CM | POA: Diagnosis not present

## 2019-10-18 DIAGNOSIS — R279 Unspecified lack of coordination: Secondary | ICD-10-CM | POA: Diagnosis not present

## 2019-10-19 DIAGNOSIS — I352 Nonrheumatic aortic (valve) stenosis with insufficiency: Secondary | ICD-10-CM | POA: Diagnosis not present

## 2019-10-19 DIAGNOSIS — M6281 Muscle weakness (generalized): Secondary | ICD-10-CM | POA: Diagnosis not present

## 2019-10-19 DIAGNOSIS — F0390 Unspecified dementia without behavioral disturbance: Secondary | ICD-10-CM | POA: Diagnosis not present

## 2019-10-19 DIAGNOSIS — R279 Unspecified lack of coordination: Secondary | ICD-10-CM | POA: Diagnosis not present

## 2019-10-19 DIAGNOSIS — R262 Difficulty in walking, not elsewhere classified: Secondary | ICD-10-CM | POA: Diagnosis not present

## 2019-10-19 DIAGNOSIS — I2699 Other pulmonary embolism without acute cor pulmonale: Secondary | ICD-10-CM | POA: Diagnosis not present

## 2019-10-20 DIAGNOSIS — F0390 Unspecified dementia without behavioral disturbance: Secondary | ICD-10-CM | POA: Diagnosis not present

## 2019-10-20 DIAGNOSIS — R262 Difficulty in walking, not elsewhere classified: Secondary | ICD-10-CM | POA: Diagnosis not present

## 2019-10-20 DIAGNOSIS — M6281 Muscle weakness (generalized): Secondary | ICD-10-CM | POA: Diagnosis not present

## 2019-10-20 DIAGNOSIS — R279 Unspecified lack of coordination: Secondary | ICD-10-CM | POA: Diagnosis not present

## 2019-10-20 DIAGNOSIS — I352 Nonrheumatic aortic (valve) stenosis with insufficiency: Secondary | ICD-10-CM | POA: Diagnosis not present

## 2019-10-20 DIAGNOSIS — I2699 Other pulmonary embolism without acute cor pulmonale: Secondary | ICD-10-CM | POA: Diagnosis not present

## 2019-10-21 DIAGNOSIS — I352 Nonrheumatic aortic (valve) stenosis with insufficiency: Secondary | ICD-10-CM | POA: Diagnosis not present

## 2019-10-21 DIAGNOSIS — R279 Unspecified lack of coordination: Secondary | ICD-10-CM | POA: Diagnosis not present

## 2019-10-21 DIAGNOSIS — I2699 Other pulmonary embolism without acute cor pulmonale: Secondary | ICD-10-CM | POA: Diagnosis not present

## 2019-10-21 DIAGNOSIS — R262 Difficulty in walking, not elsewhere classified: Secondary | ICD-10-CM | POA: Diagnosis not present

## 2019-10-21 DIAGNOSIS — F0390 Unspecified dementia without behavioral disturbance: Secondary | ICD-10-CM | POA: Diagnosis not present

## 2019-10-21 DIAGNOSIS — M6281 Muscle weakness (generalized): Secondary | ICD-10-CM | POA: Diagnosis not present

## 2019-10-22 DIAGNOSIS — M1990 Unspecified osteoarthritis, unspecified site: Secondary | ICD-10-CM | POA: Diagnosis not present

## 2019-10-22 DIAGNOSIS — E559 Vitamin D deficiency, unspecified: Secondary | ICD-10-CM | POA: Diagnosis not present

## 2019-10-22 DIAGNOSIS — D649 Anemia, unspecified: Secondary | ICD-10-CM | POA: Diagnosis not present

## 2019-10-22 DIAGNOSIS — F331 Major depressive disorder, recurrent, moderate: Secondary | ICD-10-CM | POA: Diagnosis not present

## 2019-10-22 DIAGNOSIS — M6281 Muscle weakness (generalized): Secondary | ICD-10-CM | POA: Diagnosis not present

## 2019-10-24 DIAGNOSIS — F0390 Unspecified dementia without behavioral disturbance: Secondary | ICD-10-CM | POA: Diagnosis not present

## 2019-10-24 DIAGNOSIS — I2699 Other pulmonary embolism without acute cor pulmonale: Secondary | ICD-10-CM | POA: Diagnosis not present

## 2019-10-24 DIAGNOSIS — I352 Nonrheumatic aortic (valve) stenosis with insufficiency: Secondary | ICD-10-CM | POA: Diagnosis not present

## 2019-10-24 DIAGNOSIS — M6281 Muscle weakness (generalized): Secondary | ICD-10-CM | POA: Diagnosis not present

## 2019-10-24 DIAGNOSIS — R279 Unspecified lack of coordination: Secondary | ICD-10-CM | POA: Diagnosis not present

## 2019-10-24 DIAGNOSIS — R262 Difficulty in walking, not elsewhere classified: Secondary | ICD-10-CM | POA: Diagnosis not present

## 2019-10-25 DIAGNOSIS — R279 Unspecified lack of coordination: Secondary | ICD-10-CM | POA: Diagnosis not present

## 2019-10-25 DIAGNOSIS — F0390 Unspecified dementia without behavioral disturbance: Secondary | ICD-10-CM | POA: Diagnosis not present

## 2019-10-25 DIAGNOSIS — I2699 Other pulmonary embolism without acute cor pulmonale: Secondary | ICD-10-CM | POA: Diagnosis not present

## 2019-10-25 DIAGNOSIS — R262 Difficulty in walking, not elsewhere classified: Secondary | ICD-10-CM | POA: Diagnosis not present

## 2019-10-25 DIAGNOSIS — I352 Nonrheumatic aortic (valve) stenosis with insufficiency: Secondary | ICD-10-CM | POA: Diagnosis not present

## 2019-10-25 DIAGNOSIS — M6281 Muscle weakness (generalized): Secondary | ICD-10-CM | POA: Diagnosis not present

## 2019-10-26 DIAGNOSIS — R262 Difficulty in walking, not elsewhere classified: Secondary | ICD-10-CM | POA: Diagnosis not present

## 2019-10-26 DIAGNOSIS — I2699 Other pulmonary embolism without acute cor pulmonale: Secondary | ICD-10-CM | POA: Diagnosis not present

## 2019-10-26 DIAGNOSIS — F0390 Unspecified dementia without behavioral disturbance: Secondary | ICD-10-CM | POA: Diagnosis not present

## 2019-10-26 DIAGNOSIS — M6281 Muscle weakness (generalized): Secondary | ICD-10-CM | POA: Diagnosis not present

## 2019-10-26 DIAGNOSIS — I352 Nonrheumatic aortic (valve) stenosis with insufficiency: Secondary | ICD-10-CM | POA: Diagnosis not present

## 2019-10-26 DIAGNOSIS — R279 Unspecified lack of coordination: Secondary | ICD-10-CM | POA: Diagnosis not present

## 2019-10-28 DIAGNOSIS — D649 Anemia, unspecified: Secondary | ICD-10-CM | POA: Diagnosis not present

## 2019-10-28 DIAGNOSIS — I4891 Unspecified atrial fibrillation: Secondary | ICD-10-CM | POA: Diagnosis not present

## 2019-10-28 DIAGNOSIS — I10 Essential (primary) hypertension: Secondary | ICD-10-CM | POA: Diagnosis not present

## 2019-10-28 DIAGNOSIS — M6281 Muscle weakness (generalized): Secondary | ICD-10-CM | POA: Diagnosis not present

## 2019-10-28 DIAGNOSIS — H04123 Dry eye syndrome of bilateral lacrimal glands: Secondary | ICD-10-CM | POA: Diagnosis not present

## 2019-11-01 DIAGNOSIS — F331 Major depressive disorder, recurrent, moderate: Secondary | ICD-10-CM | POA: Diagnosis not present

## 2019-11-01 DIAGNOSIS — F0390 Unspecified dementia without behavioral disturbance: Secondary | ICD-10-CM | POA: Diagnosis not present

## 2019-11-11 DIAGNOSIS — B351 Tinea unguium: Secondary | ICD-10-CM | POA: Diagnosis not present

## 2019-11-11 DIAGNOSIS — I7091 Generalized atherosclerosis: Secondary | ICD-10-CM | POA: Diagnosis not present

## 2019-11-11 DIAGNOSIS — I10 Essential (primary) hypertension: Secondary | ICD-10-CM | POA: Diagnosis not present

## 2019-11-11 DIAGNOSIS — D649 Anemia, unspecified: Secondary | ICD-10-CM | POA: Diagnosis not present

## 2019-11-11 DIAGNOSIS — I7090 Unspecified atherosclerosis: Secondary | ICD-10-CM | POA: Diagnosis not present

## 2019-11-11 DIAGNOSIS — M6281 Muscle weakness (generalized): Secondary | ICD-10-CM | POA: Diagnosis not present

## 2019-11-11 DIAGNOSIS — I4891 Unspecified atrial fibrillation: Secondary | ICD-10-CM | POA: Diagnosis not present

## 2019-11-25 DIAGNOSIS — D649 Anemia, unspecified: Secondary | ICD-10-CM | POA: Diagnosis not present

## 2019-11-25 DIAGNOSIS — M6281 Muscle weakness (generalized): Secondary | ICD-10-CM | POA: Diagnosis not present

## 2019-11-25 DIAGNOSIS — H409 Unspecified glaucoma: Secondary | ICD-10-CM | POA: Diagnosis not present

## 2019-11-25 DIAGNOSIS — I10 Essential (primary) hypertension: Secondary | ICD-10-CM | POA: Diagnosis not present

## 2019-11-25 DIAGNOSIS — I4891 Unspecified atrial fibrillation: Secondary | ICD-10-CM | POA: Diagnosis not present

## 2019-11-27 DIAGNOSIS — Z1159 Encounter for screening for other viral diseases: Secondary | ICD-10-CM | POA: Diagnosis not present

## 2019-11-27 DIAGNOSIS — I10 Essential (primary) hypertension: Secondary | ICD-10-CM | POA: Diagnosis not present

## 2019-11-27 DIAGNOSIS — E785 Hyperlipidemia, unspecified: Secondary | ICD-10-CM | POA: Diagnosis not present

## 2019-11-27 DIAGNOSIS — E649 Sequelae of unspecified nutritional deficiency: Secondary | ICD-10-CM | POA: Diagnosis not present

## 2019-11-29 DIAGNOSIS — D649 Anemia, unspecified: Secondary | ICD-10-CM | POA: Diagnosis not present

## 2019-12-02 DIAGNOSIS — F0390 Unspecified dementia without behavioral disturbance: Secondary | ICD-10-CM | POA: Diagnosis not present

## 2019-12-02 DIAGNOSIS — F331 Major depressive disorder, recurrent, moderate: Secondary | ICD-10-CM | POA: Diagnosis not present

## 2019-12-23 DIAGNOSIS — K5900 Constipation, unspecified: Secondary | ICD-10-CM | POA: Diagnosis not present

## 2019-12-23 DIAGNOSIS — I4891 Unspecified atrial fibrillation: Secondary | ICD-10-CM | POA: Diagnosis not present

## 2019-12-23 DIAGNOSIS — H409 Unspecified glaucoma: Secondary | ICD-10-CM | POA: Diagnosis not present

## 2019-12-23 DIAGNOSIS — I10 Essential (primary) hypertension: Secondary | ICD-10-CM | POA: Diagnosis not present

## 2019-12-23 DIAGNOSIS — D649 Anemia, unspecified: Secondary | ICD-10-CM | POA: Diagnosis not present

## 2019-12-30 DIAGNOSIS — F331 Major depressive disorder, recurrent, moderate: Secondary | ICD-10-CM | POA: Diagnosis not present

## 2019-12-30 DIAGNOSIS — F0390 Unspecified dementia without behavioral disturbance: Secondary | ICD-10-CM | POA: Diagnosis not present

## 2020-01-06 DIAGNOSIS — D649 Anemia, unspecified: Secondary | ICD-10-CM | POA: Diagnosis not present

## 2020-01-06 DIAGNOSIS — I10 Essential (primary) hypertension: Secondary | ICD-10-CM | POA: Diagnosis not present

## 2020-01-06 DIAGNOSIS — M6281 Muscle weakness (generalized): Secondary | ICD-10-CM | POA: Diagnosis not present

## 2020-01-06 DIAGNOSIS — E559 Vitamin D deficiency, unspecified: Secondary | ICD-10-CM | POA: Diagnosis not present

## 2020-01-20 DIAGNOSIS — I4891 Unspecified atrial fibrillation: Secondary | ICD-10-CM | POA: Diagnosis not present

## 2020-01-20 DIAGNOSIS — H409 Unspecified glaucoma: Secondary | ICD-10-CM | POA: Diagnosis not present

## 2020-01-20 DIAGNOSIS — D649 Anemia, unspecified: Secondary | ICD-10-CM | POA: Diagnosis not present

## 2020-01-20 DIAGNOSIS — E559 Vitamin D deficiency, unspecified: Secondary | ICD-10-CM | POA: Diagnosis not present

## 2020-02-01 DIAGNOSIS — H1712 Central corneal opacity, left eye: Secondary | ICD-10-CM | POA: Diagnosis not present

## 2020-02-01 DIAGNOSIS — H524 Presbyopia: Secondary | ICD-10-CM | POA: Diagnosis not present

## 2020-02-01 DIAGNOSIS — Z961 Presence of intraocular lens: Secondary | ICD-10-CM | POA: Diagnosis not present

## 2020-02-01 DIAGNOSIS — H401134 Primary open-angle glaucoma, bilateral, indeterminate stage: Secondary | ICD-10-CM | POA: Diagnosis not present

## 2020-02-10 DIAGNOSIS — M6281 Muscle weakness (generalized): Secondary | ICD-10-CM | POA: Diagnosis not present

## 2020-02-10 DIAGNOSIS — R269 Unspecified abnormalities of gait and mobility: Secondary | ICD-10-CM | POA: Diagnosis not present

## 2020-02-10 DIAGNOSIS — I10 Essential (primary) hypertension: Secondary | ICD-10-CM | POA: Diagnosis not present

## 2020-02-10 DIAGNOSIS — I4891 Unspecified atrial fibrillation: Secondary | ICD-10-CM | POA: Diagnosis not present

## 2020-02-10 DIAGNOSIS — E559 Vitamin D deficiency, unspecified: Secondary | ICD-10-CM | POA: Diagnosis not present

## 2020-02-21 DIAGNOSIS — F0390 Unspecified dementia without behavioral disturbance: Secondary | ICD-10-CM | POA: Diagnosis not present

## 2020-02-21 DIAGNOSIS — I10 Essential (primary) hypertension: Secondary | ICD-10-CM | POA: Diagnosis not present

## 2020-02-21 DIAGNOSIS — F331 Major depressive disorder, recurrent, moderate: Secondary | ICD-10-CM | POA: Diagnosis not present

## 2020-02-21 DIAGNOSIS — M6281 Muscle weakness (generalized): Secondary | ICD-10-CM | POA: Diagnosis not present

## 2020-02-21 DIAGNOSIS — K5900 Constipation, unspecified: Secondary | ICD-10-CM | POA: Diagnosis not present

## 2020-02-21 DIAGNOSIS — I4891 Unspecified atrial fibrillation: Secondary | ICD-10-CM | POA: Diagnosis not present

## 2020-02-21 DIAGNOSIS — E559 Vitamin D deficiency, unspecified: Secondary | ICD-10-CM | POA: Diagnosis not present

## 2020-02-23 DIAGNOSIS — R262 Difficulty in walking, not elsewhere classified: Secondary | ICD-10-CM | POA: Diagnosis not present

## 2020-02-23 DIAGNOSIS — I2699 Other pulmonary embolism without acute cor pulmonale: Secondary | ICD-10-CM | POA: Diagnosis not present

## 2020-02-23 DIAGNOSIS — I352 Nonrheumatic aortic (valve) stenosis with insufficiency: Secondary | ICD-10-CM | POA: Diagnosis not present

## 2020-02-23 DIAGNOSIS — F0390 Unspecified dementia without behavioral disturbance: Secondary | ICD-10-CM | POA: Diagnosis not present

## 2020-02-23 DIAGNOSIS — R279 Unspecified lack of coordination: Secondary | ICD-10-CM | POA: Diagnosis not present

## 2020-02-23 DIAGNOSIS — M6281 Muscle weakness (generalized): Secondary | ICD-10-CM | POA: Diagnosis not present

## 2020-02-25 DIAGNOSIS — M6281 Muscle weakness (generalized): Secondary | ICD-10-CM | POA: Diagnosis not present

## 2020-02-25 DIAGNOSIS — R262 Difficulty in walking, not elsewhere classified: Secondary | ICD-10-CM | POA: Diagnosis not present

## 2020-02-25 DIAGNOSIS — F0390 Unspecified dementia without behavioral disturbance: Secondary | ICD-10-CM | POA: Diagnosis not present

## 2020-02-25 DIAGNOSIS — I2699 Other pulmonary embolism without acute cor pulmonale: Secondary | ICD-10-CM | POA: Diagnosis not present

## 2020-02-25 DIAGNOSIS — R279 Unspecified lack of coordination: Secondary | ICD-10-CM | POA: Diagnosis not present

## 2020-02-25 DIAGNOSIS — I352 Nonrheumatic aortic (valve) stenosis with insufficiency: Secondary | ICD-10-CM | POA: Diagnosis not present

## 2020-02-28 DIAGNOSIS — I2699 Other pulmonary embolism without acute cor pulmonale: Secondary | ICD-10-CM | POA: Diagnosis not present

## 2020-02-28 DIAGNOSIS — E785 Hyperlipidemia, unspecified: Secondary | ICD-10-CM | POA: Diagnosis not present

## 2020-02-28 DIAGNOSIS — M25511 Pain in right shoulder: Secondary | ICD-10-CM | POA: Diagnosis not present

## 2020-02-28 DIAGNOSIS — E649 Sequelae of unspecified nutritional deficiency: Secondary | ICD-10-CM | POA: Diagnosis not present

## 2020-02-28 DIAGNOSIS — R279 Unspecified lack of coordination: Secondary | ICD-10-CM | POA: Diagnosis not present

## 2020-02-28 DIAGNOSIS — M958 Other specified acquired deformities of musculoskeletal system: Secondary | ICD-10-CM | POA: Diagnosis not present

## 2020-02-28 DIAGNOSIS — I352 Nonrheumatic aortic (valve) stenosis with insufficiency: Secondary | ICD-10-CM | POA: Diagnosis not present

## 2020-02-28 DIAGNOSIS — E7800 Pure hypercholesterolemia, unspecified: Secondary | ICD-10-CM | POA: Diagnosis not present

## 2020-02-28 DIAGNOSIS — R296 Repeated falls: Secondary | ICD-10-CM | POA: Diagnosis not present

## 2020-02-28 DIAGNOSIS — D649 Anemia, unspecified: Secondary | ICD-10-CM | POA: Diagnosis not present

## 2020-02-28 DIAGNOSIS — M6281 Muscle weakness (generalized): Secondary | ICD-10-CM | POA: Diagnosis not present

## 2020-02-28 DIAGNOSIS — R262 Difficulty in walking, not elsewhere classified: Secondary | ICD-10-CM | POA: Diagnosis not present

## 2020-02-28 DIAGNOSIS — F0390 Unspecified dementia without behavioral disturbance: Secondary | ICD-10-CM | POA: Diagnosis not present

## 2020-02-29 DIAGNOSIS — R279 Unspecified lack of coordination: Secondary | ICD-10-CM | POA: Diagnosis not present

## 2020-02-29 DIAGNOSIS — M19041 Primary osteoarthritis, right hand: Secondary | ICD-10-CM | POA: Diagnosis not present

## 2020-02-29 DIAGNOSIS — I2699 Other pulmonary embolism without acute cor pulmonale: Secondary | ICD-10-CM | POA: Diagnosis not present

## 2020-02-29 DIAGNOSIS — R262 Difficulty in walking, not elsewhere classified: Secondary | ICD-10-CM | POA: Diagnosis not present

## 2020-02-29 DIAGNOSIS — S42009A Fracture of unspecified part of unspecified clavicle, initial encounter for closed fracture: Secondary | ICD-10-CM | POA: Diagnosis not present

## 2020-02-29 DIAGNOSIS — F0390 Unspecified dementia without behavioral disturbance: Secondary | ICD-10-CM | POA: Diagnosis not present

## 2020-02-29 DIAGNOSIS — M19011 Primary osteoarthritis, right shoulder: Secondary | ICD-10-CM | POA: Diagnosis not present

## 2020-02-29 DIAGNOSIS — M6281 Muscle weakness (generalized): Secondary | ICD-10-CM | POA: Diagnosis not present

## 2020-02-29 DIAGNOSIS — I352 Nonrheumatic aortic (valve) stenosis with insufficiency: Secondary | ICD-10-CM | POA: Diagnosis not present

## 2020-02-29 DIAGNOSIS — I517 Cardiomegaly: Secondary | ICD-10-CM | POA: Diagnosis not present

## 2020-03-01 DIAGNOSIS — R279 Unspecified lack of coordination: Secondary | ICD-10-CM | POA: Diagnosis not present

## 2020-03-01 DIAGNOSIS — F0390 Unspecified dementia without behavioral disturbance: Secondary | ICD-10-CM | POA: Diagnosis not present

## 2020-03-01 DIAGNOSIS — R262 Difficulty in walking, not elsewhere classified: Secondary | ICD-10-CM | POA: Diagnosis not present

## 2020-03-01 DIAGNOSIS — I352 Nonrheumatic aortic (valve) stenosis with insufficiency: Secondary | ICD-10-CM | POA: Diagnosis not present

## 2020-03-01 DIAGNOSIS — I2699 Other pulmonary embolism without acute cor pulmonale: Secondary | ICD-10-CM | POA: Diagnosis not present

## 2020-03-01 DIAGNOSIS — B351 Tinea unguium: Secondary | ICD-10-CM | POA: Diagnosis not present

## 2020-03-01 DIAGNOSIS — M6281 Muscle weakness (generalized): Secondary | ICD-10-CM | POA: Diagnosis not present

## 2020-03-01 DIAGNOSIS — I7091 Generalized atherosclerosis: Secondary | ICD-10-CM | POA: Diagnosis not present

## 2020-03-02 DIAGNOSIS — S43204A Unspecified dislocation of right sternoclavicular joint, initial encounter: Secondary | ICD-10-CM | POA: Diagnosis not present

## 2020-03-02 DIAGNOSIS — R262 Difficulty in walking, not elsewhere classified: Secondary | ICD-10-CM | POA: Diagnosis not present

## 2020-03-02 DIAGNOSIS — R279 Unspecified lack of coordination: Secondary | ICD-10-CM | POA: Diagnosis not present

## 2020-03-02 DIAGNOSIS — M6281 Muscle weakness (generalized): Secondary | ICD-10-CM | POA: Diagnosis not present

## 2020-03-02 DIAGNOSIS — I2699 Other pulmonary embolism without acute cor pulmonale: Secondary | ICD-10-CM | POA: Diagnosis not present

## 2020-03-02 DIAGNOSIS — I352 Nonrheumatic aortic (valve) stenosis with insufficiency: Secondary | ICD-10-CM | POA: Diagnosis not present

## 2020-03-02 DIAGNOSIS — S638X1A Sprain of other part of right wrist and hand, initial encounter: Secondary | ICD-10-CM | POA: Diagnosis not present

## 2020-03-02 DIAGNOSIS — F0390 Unspecified dementia without behavioral disturbance: Secondary | ICD-10-CM | POA: Diagnosis not present

## 2020-03-03 DIAGNOSIS — I352 Nonrheumatic aortic (valve) stenosis with insufficiency: Secondary | ICD-10-CM | POA: Diagnosis not present

## 2020-03-03 DIAGNOSIS — M6281 Muscle weakness (generalized): Secondary | ICD-10-CM | POA: Diagnosis not present

## 2020-03-03 DIAGNOSIS — R279 Unspecified lack of coordination: Secondary | ICD-10-CM | POA: Diagnosis not present

## 2020-03-03 DIAGNOSIS — F0390 Unspecified dementia without behavioral disturbance: Secondary | ICD-10-CM | POA: Diagnosis not present

## 2020-03-03 DIAGNOSIS — N390 Urinary tract infection, site not specified: Secondary | ICD-10-CM | POA: Diagnosis not present

## 2020-03-03 DIAGNOSIS — I2699 Other pulmonary embolism without acute cor pulmonale: Secondary | ICD-10-CM | POA: Diagnosis not present

## 2020-03-03 DIAGNOSIS — R262 Difficulty in walking, not elsewhere classified: Secondary | ICD-10-CM | POA: Diagnosis not present

## 2020-03-06 DIAGNOSIS — R279 Unspecified lack of coordination: Secondary | ICD-10-CM | POA: Diagnosis not present

## 2020-03-06 DIAGNOSIS — I352 Nonrheumatic aortic (valve) stenosis with insufficiency: Secondary | ICD-10-CM | POA: Diagnosis not present

## 2020-03-06 DIAGNOSIS — R262 Difficulty in walking, not elsewhere classified: Secondary | ICD-10-CM | POA: Diagnosis not present

## 2020-03-06 DIAGNOSIS — I2699 Other pulmonary embolism without acute cor pulmonale: Secondary | ICD-10-CM | POA: Diagnosis not present

## 2020-03-06 DIAGNOSIS — M6281 Muscle weakness (generalized): Secondary | ICD-10-CM | POA: Diagnosis not present

## 2020-03-06 DIAGNOSIS — F0390 Unspecified dementia without behavioral disturbance: Secondary | ICD-10-CM | POA: Diagnosis not present

## 2020-03-07 DIAGNOSIS — I352 Nonrheumatic aortic (valve) stenosis with insufficiency: Secondary | ICD-10-CM | POA: Diagnosis not present

## 2020-03-07 DIAGNOSIS — F0390 Unspecified dementia without behavioral disturbance: Secondary | ICD-10-CM | POA: Diagnosis not present

## 2020-03-07 DIAGNOSIS — M6281 Muscle weakness (generalized): Secondary | ICD-10-CM | POA: Diagnosis not present

## 2020-03-07 DIAGNOSIS — R262 Difficulty in walking, not elsewhere classified: Secondary | ICD-10-CM | POA: Diagnosis not present

## 2020-03-07 DIAGNOSIS — I2699 Other pulmonary embolism without acute cor pulmonale: Secondary | ICD-10-CM | POA: Diagnosis not present

## 2020-03-07 DIAGNOSIS — R279 Unspecified lack of coordination: Secondary | ICD-10-CM | POA: Diagnosis not present

## 2020-03-08 DIAGNOSIS — F0390 Unspecified dementia without behavioral disturbance: Secondary | ICD-10-CM | POA: Diagnosis not present

## 2020-03-08 DIAGNOSIS — R279 Unspecified lack of coordination: Secondary | ICD-10-CM | POA: Diagnosis not present

## 2020-03-08 DIAGNOSIS — I352 Nonrheumatic aortic (valve) stenosis with insufficiency: Secondary | ICD-10-CM | POA: Diagnosis not present

## 2020-03-08 DIAGNOSIS — M6281 Muscle weakness (generalized): Secondary | ICD-10-CM | POA: Diagnosis not present

## 2020-03-08 DIAGNOSIS — I2699 Other pulmonary embolism without acute cor pulmonale: Secondary | ICD-10-CM | POA: Diagnosis not present

## 2020-03-08 DIAGNOSIS — R262 Difficulty in walking, not elsewhere classified: Secondary | ICD-10-CM | POA: Diagnosis not present

## 2020-03-09 DIAGNOSIS — F0390 Unspecified dementia without behavioral disturbance: Secondary | ICD-10-CM | POA: Diagnosis not present

## 2020-03-09 DIAGNOSIS — R279 Unspecified lack of coordination: Secondary | ICD-10-CM | POA: Diagnosis not present

## 2020-03-09 DIAGNOSIS — I2699 Other pulmonary embolism without acute cor pulmonale: Secondary | ICD-10-CM | POA: Diagnosis not present

## 2020-03-09 DIAGNOSIS — M6281 Muscle weakness (generalized): Secondary | ICD-10-CM | POA: Diagnosis not present

## 2020-03-09 DIAGNOSIS — R262 Difficulty in walking, not elsewhere classified: Secondary | ICD-10-CM | POA: Diagnosis not present

## 2020-03-09 DIAGNOSIS — I352 Nonrheumatic aortic (valve) stenosis with insufficiency: Secondary | ICD-10-CM | POA: Diagnosis not present

## 2020-03-10 DIAGNOSIS — F0390 Unspecified dementia without behavioral disturbance: Secondary | ICD-10-CM | POA: Diagnosis not present

## 2020-03-10 DIAGNOSIS — R262 Difficulty in walking, not elsewhere classified: Secondary | ICD-10-CM | POA: Diagnosis not present

## 2020-03-10 DIAGNOSIS — I352 Nonrheumatic aortic (valve) stenosis with insufficiency: Secondary | ICD-10-CM | POA: Diagnosis not present

## 2020-03-10 DIAGNOSIS — I2699 Other pulmonary embolism without acute cor pulmonale: Secondary | ICD-10-CM | POA: Diagnosis not present

## 2020-03-10 DIAGNOSIS — R279 Unspecified lack of coordination: Secondary | ICD-10-CM | POA: Diagnosis not present

## 2020-03-10 DIAGNOSIS — M6281 Muscle weakness (generalized): Secondary | ICD-10-CM | POA: Diagnosis not present

## 2020-03-13 DIAGNOSIS — M6281 Muscle weakness (generalized): Secondary | ICD-10-CM | POA: Diagnosis not present

## 2020-03-13 DIAGNOSIS — I2699 Other pulmonary embolism without acute cor pulmonale: Secondary | ICD-10-CM | POA: Diagnosis not present

## 2020-03-13 DIAGNOSIS — F0390 Unspecified dementia without behavioral disturbance: Secondary | ICD-10-CM | POA: Diagnosis not present

## 2020-03-13 DIAGNOSIS — I352 Nonrheumatic aortic (valve) stenosis with insufficiency: Secondary | ICD-10-CM | POA: Diagnosis not present

## 2020-03-13 DIAGNOSIS — R262 Difficulty in walking, not elsewhere classified: Secondary | ICD-10-CM | POA: Diagnosis not present

## 2020-03-13 DIAGNOSIS — R279 Unspecified lack of coordination: Secondary | ICD-10-CM | POA: Diagnosis not present

## 2020-03-14 DIAGNOSIS — R262 Difficulty in walking, not elsewhere classified: Secondary | ICD-10-CM | POA: Diagnosis not present

## 2020-03-14 DIAGNOSIS — M6281 Muscle weakness (generalized): Secondary | ICD-10-CM | POA: Diagnosis not present

## 2020-03-14 DIAGNOSIS — F0390 Unspecified dementia without behavioral disturbance: Secondary | ICD-10-CM | POA: Diagnosis not present

## 2020-03-14 DIAGNOSIS — I352 Nonrheumatic aortic (valve) stenosis with insufficiency: Secondary | ICD-10-CM | POA: Diagnosis not present

## 2020-03-14 DIAGNOSIS — R279 Unspecified lack of coordination: Secondary | ICD-10-CM | POA: Diagnosis not present

## 2020-03-14 DIAGNOSIS — I2699 Other pulmonary embolism without acute cor pulmonale: Secondary | ICD-10-CM | POA: Diagnosis not present

## 2020-03-15 DIAGNOSIS — R279 Unspecified lack of coordination: Secondary | ICD-10-CM | POA: Diagnosis not present

## 2020-03-15 DIAGNOSIS — I352 Nonrheumatic aortic (valve) stenosis with insufficiency: Secondary | ICD-10-CM | POA: Diagnosis not present

## 2020-03-15 DIAGNOSIS — F0390 Unspecified dementia without behavioral disturbance: Secondary | ICD-10-CM | POA: Diagnosis not present

## 2020-03-15 DIAGNOSIS — I2699 Other pulmonary embolism without acute cor pulmonale: Secondary | ICD-10-CM | POA: Diagnosis not present

## 2020-03-15 DIAGNOSIS — R262 Difficulty in walking, not elsewhere classified: Secondary | ICD-10-CM | POA: Diagnosis not present

## 2020-03-15 DIAGNOSIS — M6281 Muscle weakness (generalized): Secondary | ICD-10-CM | POA: Diagnosis not present

## 2020-03-16 DIAGNOSIS — F0390 Unspecified dementia without behavioral disturbance: Secondary | ICD-10-CM | POA: Diagnosis not present

## 2020-03-16 DIAGNOSIS — M6281 Muscle weakness (generalized): Secondary | ICD-10-CM | POA: Diagnosis not present

## 2020-03-16 DIAGNOSIS — I2699 Other pulmonary embolism without acute cor pulmonale: Secondary | ICD-10-CM | POA: Diagnosis not present

## 2020-03-16 DIAGNOSIS — I4891 Unspecified atrial fibrillation: Secondary | ICD-10-CM | POA: Diagnosis not present

## 2020-03-16 DIAGNOSIS — M1990 Unspecified osteoarthritis, unspecified site: Secondary | ICD-10-CM | POA: Diagnosis not present

## 2020-03-16 DIAGNOSIS — I10 Essential (primary) hypertension: Secondary | ICD-10-CM | POA: Diagnosis not present

## 2020-03-16 DIAGNOSIS — R262 Difficulty in walking, not elsewhere classified: Secondary | ICD-10-CM | POA: Diagnosis not present

## 2020-03-16 DIAGNOSIS — E559 Vitamin D deficiency, unspecified: Secondary | ICD-10-CM | POA: Diagnosis not present

## 2020-03-16 DIAGNOSIS — R279 Unspecified lack of coordination: Secondary | ICD-10-CM | POA: Diagnosis not present

## 2020-03-16 DIAGNOSIS — I352 Nonrheumatic aortic (valve) stenosis with insufficiency: Secondary | ICD-10-CM | POA: Diagnosis not present

## 2020-03-17 DIAGNOSIS — F0390 Unspecified dementia without behavioral disturbance: Secondary | ICD-10-CM | POA: Diagnosis not present

## 2020-03-17 DIAGNOSIS — I2699 Other pulmonary embolism without acute cor pulmonale: Secondary | ICD-10-CM | POA: Diagnosis not present

## 2020-03-17 DIAGNOSIS — M6281 Muscle weakness (generalized): Secondary | ICD-10-CM | POA: Diagnosis not present

## 2020-03-17 DIAGNOSIS — R279 Unspecified lack of coordination: Secondary | ICD-10-CM | POA: Diagnosis not present

## 2020-03-17 DIAGNOSIS — R262 Difficulty in walking, not elsewhere classified: Secondary | ICD-10-CM | POA: Diagnosis not present

## 2020-03-17 DIAGNOSIS — I352 Nonrheumatic aortic (valve) stenosis with insufficiency: Secondary | ICD-10-CM | POA: Diagnosis not present

## 2020-03-19 DIAGNOSIS — I2699 Other pulmonary embolism without acute cor pulmonale: Secondary | ICD-10-CM | POA: Diagnosis not present

## 2020-03-19 DIAGNOSIS — R262 Difficulty in walking, not elsewhere classified: Secondary | ICD-10-CM | POA: Diagnosis not present

## 2020-03-19 DIAGNOSIS — F0390 Unspecified dementia without behavioral disturbance: Secondary | ICD-10-CM | POA: Diagnosis not present

## 2020-03-19 DIAGNOSIS — M6281 Muscle weakness (generalized): Secondary | ICD-10-CM | POA: Diagnosis not present

## 2020-03-19 DIAGNOSIS — I352 Nonrheumatic aortic (valve) stenosis with insufficiency: Secondary | ICD-10-CM | POA: Diagnosis not present

## 2020-03-19 DIAGNOSIS — R279 Unspecified lack of coordination: Secondary | ICD-10-CM | POA: Diagnosis not present

## 2020-03-20 DIAGNOSIS — I2699 Other pulmonary embolism without acute cor pulmonale: Secondary | ICD-10-CM | POA: Diagnosis not present

## 2020-03-20 DIAGNOSIS — I352 Nonrheumatic aortic (valve) stenosis with insufficiency: Secondary | ICD-10-CM | POA: Diagnosis not present

## 2020-03-20 DIAGNOSIS — R262 Difficulty in walking, not elsewhere classified: Secondary | ICD-10-CM | POA: Diagnosis not present

## 2020-03-20 DIAGNOSIS — F0390 Unspecified dementia without behavioral disturbance: Secondary | ICD-10-CM | POA: Diagnosis not present

## 2020-03-20 DIAGNOSIS — R279 Unspecified lack of coordination: Secondary | ICD-10-CM | POA: Diagnosis not present

## 2020-03-20 DIAGNOSIS — M6281 Muscle weakness (generalized): Secondary | ICD-10-CM | POA: Diagnosis not present

## 2020-03-21 DIAGNOSIS — F0390 Unspecified dementia without behavioral disturbance: Secondary | ICD-10-CM | POA: Diagnosis not present

## 2020-03-21 DIAGNOSIS — I10 Essential (primary) hypertension: Secondary | ICD-10-CM | POA: Diagnosis not present

## 2020-03-21 DIAGNOSIS — D649 Anemia, unspecified: Secondary | ICD-10-CM | POA: Diagnosis not present

## 2020-03-21 DIAGNOSIS — I352 Nonrheumatic aortic (valve) stenosis with insufficiency: Secondary | ICD-10-CM | POA: Diagnosis not present

## 2020-03-21 DIAGNOSIS — K5900 Constipation, unspecified: Secondary | ICD-10-CM | POA: Diagnosis not present

## 2020-03-21 DIAGNOSIS — H409 Unspecified glaucoma: Secondary | ICD-10-CM | POA: Diagnosis not present

## 2020-03-21 DIAGNOSIS — F331 Major depressive disorder, recurrent, moderate: Secondary | ICD-10-CM | POA: Diagnosis not present

## 2020-03-21 DIAGNOSIS — I4891 Unspecified atrial fibrillation: Secondary | ICD-10-CM | POA: Diagnosis not present

## 2020-03-21 DIAGNOSIS — M6281 Muscle weakness (generalized): Secondary | ICD-10-CM | POA: Diagnosis not present

## 2020-03-21 DIAGNOSIS — R262 Difficulty in walking, not elsewhere classified: Secondary | ICD-10-CM | POA: Diagnosis not present

## 2020-03-21 DIAGNOSIS — I2699 Other pulmonary embolism without acute cor pulmonale: Secondary | ICD-10-CM | POA: Diagnosis not present

## 2020-03-21 DIAGNOSIS — R279 Unspecified lack of coordination: Secondary | ICD-10-CM | POA: Diagnosis not present

## 2020-03-22 DIAGNOSIS — I352 Nonrheumatic aortic (valve) stenosis with insufficiency: Secondary | ICD-10-CM | POA: Diagnosis not present

## 2020-03-22 DIAGNOSIS — I2699 Other pulmonary embolism without acute cor pulmonale: Secondary | ICD-10-CM | POA: Diagnosis not present

## 2020-03-22 DIAGNOSIS — R279 Unspecified lack of coordination: Secondary | ICD-10-CM | POA: Diagnosis not present

## 2020-03-22 DIAGNOSIS — F0390 Unspecified dementia without behavioral disturbance: Secondary | ICD-10-CM | POA: Diagnosis not present

## 2020-03-22 DIAGNOSIS — R262 Difficulty in walking, not elsewhere classified: Secondary | ICD-10-CM | POA: Diagnosis not present

## 2020-03-22 DIAGNOSIS — M6281 Muscle weakness (generalized): Secondary | ICD-10-CM | POA: Diagnosis not present

## 2020-03-23 DIAGNOSIS — M6281 Muscle weakness (generalized): Secondary | ICD-10-CM | POA: Diagnosis not present

## 2020-03-23 DIAGNOSIS — I2699 Other pulmonary embolism without acute cor pulmonale: Secondary | ICD-10-CM | POA: Diagnosis not present

## 2020-03-23 DIAGNOSIS — I352 Nonrheumatic aortic (valve) stenosis with insufficiency: Secondary | ICD-10-CM | POA: Diagnosis not present

## 2020-03-23 DIAGNOSIS — R262 Difficulty in walking, not elsewhere classified: Secondary | ICD-10-CM | POA: Diagnosis not present

## 2020-03-23 DIAGNOSIS — R279 Unspecified lack of coordination: Secondary | ICD-10-CM | POA: Diagnosis not present

## 2020-03-23 DIAGNOSIS — F0390 Unspecified dementia without behavioral disturbance: Secondary | ICD-10-CM | POA: Diagnosis not present

## 2020-03-24 DIAGNOSIS — M6281 Muscle weakness (generalized): Secondary | ICD-10-CM | POA: Diagnosis not present

## 2020-03-24 DIAGNOSIS — R262 Difficulty in walking, not elsewhere classified: Secondary | ICD-10-CM | POA: Diagnosis not present

## 2020-03-24 DIAGNOSIS — F0390 Unspecified dementia without behavioral disturbance: Secondary | ICD-10-CM | POA: Diagnosis not present

## 2020-03-24 DIAGNOSIS — R279 Unspecified lack of coordination: Secondary | ICD-10-CM | POA: Diagnosis not present

## 2020-03-24 DIAGNOSIS — I2699 Other pulmonary embolism without acute cor pulmonale: Secondary | ICD-10-CM | POA: Diagnosis not present

## 2020-03-24 DIAGNOSIS — I352 Nonrheumatic aortic (valve) stenosis with insufficiency: Secondary | ICD-10-CM | POA: Diagnosis not present

## 2020-04-18 DIAGNOSIS — H903 Sensorineural hearing loss, bilateral: Secondary | ICD-10-CM | POA: Diagnosis not present

## 2020-04-18 DIAGNOSIS — K5900 Constipation, unspecified: Secondary | ICD-10-CM | POA: Diagnosis not present

## 2020-04-18 DIAGNOSIS — I4891 Unspecified atrial fibrillation: Secondary | ICD-10-CM | POA: Diagnosis not present

## 2020-04-18 DIAGNOSIS — F331 Major depressive disorder, recurrent, moderate: Secondary | ICD-10-CM | POA: Diagnosis not present

## 2020-04-18 DIAGNOSIS — I10 Essential (primary) hypertension: Secondary | ICD-10-CM | POA: Diagnosis not present

## 2020-04-18 DIAGNOSIS — E559 Vitamin D deficiency, unspecified: Secondary | ICD-10-CM | POA: Diagnosis not present

## 2020-04-18 DIAGNOSIS — H409 Unspecified glaucoma: Secondary | ICD-10-CM | POA: Diagnosis not present

## 2020-04-18 DIAGNOSIS — R42 Dizziness and giddiness: Secondary | ICD-10-CM | POA: Diagnosis not present

## 2020-04-20 DIAGNOSIS — M1990 Unspecified osteoarthritis, unspecified site: Secondary | ICD-10-CM | POA: Diagnosis not present

## 2020-04-20 DIAGNOSIS — M6281 Muscle weakness (generalized): Secondary | ICD-10-CM | POA: Diagnosis not present

## 2020-04-20 DIAGNOSIS — R269 Unspecified abnormalities of gait and mobility: Secondary | ICD-10-CM | POA: Diagnosis not present

## 2020-04-20 DIAGNOSIS — I4891 Unspecified atrial fibrillation: Secondary | ICD-10-CM | POA: Diagnosis not present

## 2020-04-20 DIAGNOSIS — F330 Major depressive disorder, recurrent, mild: Secondary | ICD-10-CM | POA: Diagnosis not present

## 2020-04-20 DIAGNOSIS — E559 Vitamin D deficiency, unspecified: Secondary | ICD-10-CM | POA: Diagnosis not present

## 2020-04-20 DIAGNOSIS — I10 Essential (primary) hypertension: Secondary | ICD-10-CM | POA: Diagnosis not present

## 2020-05-08 DIAGNOSIS — F331 Major depressive disorder, recurrent, moderate: Secondary | ICD-10-CM | POA: Diagnosis not present

## 2020-05-18 DIAGNOSIS — H04123 Dry eye syndrome of bilateral lacrimal glands: Secondary | ICD-10-CM | POA: Diagnosis not present

## 2020-05-18 DIAGNOSIS — F419 Anxiety disorder, unspecified: Secondary | ICD-10-CM | POA: Diagnosis not present

## 2020-05-18 DIAGNOSIS — D649 Anemia, unspecified: Secondary | ICD-10-CM | POA: Diagnosis not present

## 2020-05-18 DIAGNOSIS — H409 Unspecified glaucoma: Secondary | ICD-10-CM | POA: Diagnosis not present

## 2020-05-18 DIAGNOSIS — Z961 Presence of intraocular lens: Secondary | ICD-10-CM | POA: Diagnosis not present

## 2020-05-18 DIAGNOSIS — R279 Unspecified lack of coordination: Secondary | ICD-10-CM | POA: Diagnosis not present

## 2020-05-18 DIAGNOSIS — I2699 Other pulmonary embolism without acute cor pulmonale: Secondary | ICD-10-CM | POA: Diagnosis not present

## 2020-05-18 DIAGNOSIS — F0390 Unspecified dementia without behavioral disturbance: Secondary | ICD-10-CM | POA: Diagnosis not present

## 2020-05-18 DIAGNOSIS — H1712 Central corneal opacity, left eye: Secondary | ICD-10-CM | POA: Diagnosis not present

## 2020-05-18 DIAGNOSIS — E559 Vitamin D deficiency, unspecified: Secondary | ICD-10-CM | POA: Diagnosis not present

## 2020-05-18 DIAGNOSIS — I4891 Unspecified atrial fibrillation: Secondary | ICD-10-CM | POA: Diagnosis not present

## 2020-05-18 DIAGNOSIS — R262 Difficulty in walking, not elsewhere classified: Secondary | ICD-10-CM | POA: Diagnosis not present

## 2020-05-18 DIAGNOSIS — I352 Nonrheumatic aortic (valve) stenosis with insufficiency: Secondary | ICD-10-CM | POA: Diagnosis not present

## 2020-05-18 DIAGNOSIS — K5900 Constipation, unspecified: Secondary | ICD-10-CM | POA: Diagnosis not present

## 2020-05-18 DIAGNOSIS — M6281 Muscle weakness (generalized): Secondary | ICD-10-CM | POA: Diagnosis not present

## 2020-05-18 DIAGNOSIS — H401134 Primary open-angle glaucoma, bilateral, indeterminate stage: Secondary | ICD-10-CM | POA: Diagnosis not present

## 2020-05-19 DIAGNOSIS — E119 Type 2 diabetes mellitus without complications: Secondary | ICD-10-CM | POA: Diagnosis not present

## 2020-05-19 DIAGNOSIS — I2699 Other pulmonary embolism without acute cor pulmonale: Secondary | ICD-10-CM | POA: Diagnosis not present

## 2020-05-23 DIAGNOSIS — I352 Nonrheumatic aortic (valve) stenosis with insufficiency: Secondary | ICD-10-CM | POA: Diagnosis not present

## 2020-05-23 DIAGNOSIS — R262 Difficulty in walking, not elsewhere classified: Secondary | ICD-10-CM | POA: Diagnosis not present

## 2020-05-23 DIAGNOSIS — M6281 Muscle weakness (generalized): Secondary | ICD-10-CM | POA: Diagnosis not present

## 2020-05-23 DIAGNOSIS — I2699 Other pulmonary embolism without acute cor pulmonale: Secondary | ICD-10-CM | POA: Diagnosis not present

## 2020-05-23 DIAGNOSIS — F0390 Unspecified dementia without behavioral disturbance: Secondary | ICD-10-CM | POA: Diagnosis not present

## 2020-05-23 DIAGNOSIS — R279 Unspecified lack of coordination: Secondary | ICD-10-CM | POA: Diagnosis not present

## 2020-05-24 DIAGNOSIS — R279 Unspecified lack of coordination: Secondary | ICD-10-CM | POA: Diagnosis not present

## 2020-05-24 DIAGNOSIS — F0390 Unspecified dementia without behavioral disturbance: Secondary | ICD-10-CM | POA: Diagnosis not present

## 2020-05-24 DIAGNOSIS — M6281 Muscle weakness (generalized): Secondary | ICD-10-CM | POA: Diagnosis not present

## 2020-05-24 DIAGNOSIS — R262 Difficulty in walking, not elsewhere classified: Secondary | ICD-10-CM | POA: Diagnosis not present

## 2020-05-24 DIAGNOSIS — I352 Nonrheumatic aortic (valve) stenosis with insufficiency: Secondary | ICD-10-CM | POA: Diagnosis not present

## 2020-05-24 DIAGNOSIS — I2699 Other pulmonary embolism without acute cor pulmonale: Secondary | ICD-10-CM | POA: Diagnosis not present

## 2020-05-25 DIAGNOSIS — I2699 Other pulmonary embolism without acute cor pulmonale: Secondary | ICD-10-CM | POA: Diagnosis not present

## 2020-05-25 DIAGNOSIS — I10 Essential (primary) hypertension: Secondary | ICD-10-CM | POA: Diagnosis not present

## 2020-05-25 DIAGNOSIS — I352 Nonrheumatic aortic (valve) stenosis with insufficiency: Secondary | ICD-10-CM | POA: Diagnosis not present

## 2020-05-25 DIAGNOSIS — M6281 Muscle weakness (generalized): Secondary | ICD-10-CM | POA: Diagnosis not present

## 2020-05-25 DIAGNOSIS — E559 Vitamin D deficiency, unspecified: Secondary | ICD-10-CM | POA: Diagnosis not present

## 2020-05-25 DIAGNOSIS — F0390 Unspecified dementia without behavioral disturbance: Secondary | ICD-10-CM | POA: Diagnosis not present

## 2020-05-25 DIAGNOSIS — R279 Unspecified lack of coordination: Secondary | ICD-10-CM | POA: Diagnosis not present

## 2020-05-25 DIAGNOSIS — F329 Major depressive disorder, single episode, unspecified: Secondary | ICD-10-CM | POA: Diagnosis not present

## 2020-05-25 DIAGNOSIS — R269 Unspecified abnormalities of gait and mobility: Secondary | ICD-10-CM | POA: Diagnosis not present

## 2020-05-25 DIAGNOSIS — R262 Difficulty in walking, not elsewhere classified: Secondary | ICD-10-CM | POA: Diagnosis not present

## 2020-05-25 DIAGNOSIS — D649 Anemia, unspecified: Secondary | ICD-10-CM | POA: Diagnosis not present

## 2020-05-26 DIAGNOSIS — F0390 Unspecified dementia without behavioral disturbance: Secondary | ICD-10-CM | POA: Diagnosis not present

## 2020-05-26 DIAGNOSIS — R262 Difficulty in walking, not elsewhere classified: Secondary | ICD-10-CM | POA: Diagnosis not present

## 2020-05-26 DIAGNOSIS — I2699 Other pulmonary embolism without acute cor pulmonale: Secondary | ICD-10-CM | POA: Diagnosis not present

## 2020-05-26 DIAGNOSIS — M6281 Muscle weakness (generalized): Secondary | ICD-10-CM | POA: Diagnosis not present

## 2020-05-26 DIAGNOSIS — I352 Nonrheumatic aortic (valve) stenosis with insufficiency: Secondary | ICD-10-CM | POA: Diagnosis not present

## 2020-05-26 DIAGNOSIS — R279 Unspecified lack of coordination: Secondary | ICD-10-CM | POA: Diagnosis not present

## 2020-05-28 DIAGNOSIS — M6281 Muscle weakness (generalized): Secondary | ICD-10-CM | POA: Diagnosis not present

## 2020-05-28 DIAGNOSIS — F0390 Unspecified dementia without behavioral disturbance: Secondary | ICD-10-CM | POA: Diagnosis not present

## 2020-05-28 DIAGNOSIS — I352 Nonrheumatic aortic (valve) stenosis with insufficiency: Secondary | ICD-10-CM | POA: Diagnosis not present

## 2020-05-28 DIAGNOSIS — R279 Unspecified lack of coordination: Secondary | ICD-10-CM | POA: Diagnosis not present

## 2020-05-28 DIAGNOSIS — R262 Difficulty in walking, not elsewhere classified: Secondary | ICD-10-CM | POA: Diagnosis not present

## 2020-05-28 DIAGNOSIS — I2699 Other pulmonary embolism without acute cor pulmonale: Secondary | ICD-10-CM | POA: Diagnosis not present

## 2020-05-29 DIAGNOSIS — R262 Difficulty in walking, not elsewhere classified: Secondary | ICD-10-CM | POA: Diagnosis not present

## 2020-05-29 DIAGNOSIS — F0390 Unspecified dementia without behavioral disturbance: Secondary | ICD-10-CM | POA: Diagnosis not present

## 2020-05-29 DIAGNOSIS — M6281 Muscle weakness (generalized): Secondary | ICD-10-CM | POA: Diagnosis not present

## 2020-05-29 DIAGNOSIS — R279 Unspecified lack of coordination: Secondary | ICD-10-CM | POA: Diagnosis not present

## 2020-05-29 DIAGNOSIS — I352 Nonrheumatic aortic (valve) stenosis with insufficiency: Secondary | ICD-10-CM | POA: Diagnosis not present

## 2020-05-29 DIAGNOSIS — I2699 Other pulmonary embolism without acute cor pulmonale: Secondary | ICD-10-CM | POA: Diagnosis not present

## 2020-05-30 DIAGNOSIS — R279 Unspecified lack of coordination: Secondary | ICD-10-CM | POA: Diagnosis not present

## 2020-05-30 DIAGNOSIS — M6281 Muscle weakness (generalized): Secondary | ICD-10-CM | POA: Diagnosis not present

## 2020-05-30 DIAGNOSIS — F0390 Unspecified dementia without behavioral disturbance: Secondary | ICD-10-CM | POA: Diagnosis not present

## 2020-05-30 DIAGNOSIS — R262 Difficulty in walking, not elsewhere classified: Secondary | ICD-10-CM | POA: Diagnosis not present

## 2020-05-30 DIAGNOSIS — I352 Nonrheumatic aortic (valve) stenosis with insufficiency: Secondary | ICD-10-CM | POA: Diagnosis not present

## 2020-05-30 DIAGNOSIS — I2699 Other pulmonary embolism without acute cor pulmonale: Secondary | ICD-10-CM | POA: Diagnosis not present

## 2020-05-31 DIAGNOSIS — I2699 Other pulmonary embolism without acute cor pulmonale: Secondary | ICD-10-CM | POA: Diagnosis not present

## 2020-05-31 DIAGNOSIS — R262 Difficulty in walking, not elsewhere classified: Secondary | ICD-10-CM | POA: Diagnosis not present

## 2020-05-31 DIAGNOSIS — M6281 Muscle weakness (generalized): Secondary | ICD-10-CM | POA: Diagnosis not present

## 2020-05-31 DIAGNOSIS — I352 Nonrheumatic aortic (valve) stenosis with insufficiency: Secondary | ICD-10-CM | POA: Diagnosis not present

## 2020-05-31 DIAGNOSIS — R279 Unspecified lack of coordination: Secondary | ICD-10-CM | POA: Diagnosis not present

## 2020-05-31 DIAGNOSIS — F0390 Unspecified dementia without behavioral disturbance: Secondary | ICD-10-CM | POA: Diagnosis not present

## 2020-06-01 DIAGNOSIS — R279 Unspecified lack of coordination: Secondary | ICD-10-CM | POA: Diagnosis not present

## 2020-06-01 DIAGNOSIS — I352 Nonrheumatic aortic (valve) stenosis with insufficiency: Secondary | ICD-10-CM | POA: Diagnosis not present

## 2020-06-01 DIAGNOSIS — I2699 Other pulmonary embolism without acute cor pulmonale: Secondary | ICD-10-CM | POA: Diagnosis not present

## 2020-06-01 DIAGNOSIS — F0390 Unspecified dementia without behavioral disturbance: Secondary | ICD-10-CM | POA: Diagnosis not present

## 2020-06-01 DIAGNOSIS — M6281 Muscle weakness (generalized): Secondary | ICD-10-CM | POA: Diagnosis not present

## 2020-06-01 DIAGNOSIS — R262 Difficulty in walking, not elsewhere classified: Secondary | ICD-10-CM | POA: Diagnosis not present

## 2020-06-05 DIAGNOSIS — F0390 Unspecified dementia without behavioral disturbance: Secondary | ICD-10-CM | POA: Diagnosis not present

## 2020-06-05 DIAGNOSIS — I2699 Other pulmonary embolism without acute cor pulmonale: Secondary | ICD-10-CM | POA: Diagnosis not present

## 2020-06-05 DIAGNOSIS — R262 Difficulty in walking, not elsewhere classified: Secondary | ICD-10-CM | POA: Diagnosis not present

## 2020-06-05 DIAGNOSIS — R279 Unspecified lack of coordination: Secondary | ICD-10-CM | POA: Diagnosis not present

## 2020-06-05 DIAGNOSIS — I352 Nonrheumatic aortic (valve) stenosis with insufficiency: Secondary | ICD-10-CM | POA: Diagnosis not present

## 2020-06-05 DIAGNOSIS — M6281 Muscle weakness (generalized): Secondary | ICD-10-CM | POA: Diagnosis not present

## 2020-06-06 DIAGNOSIS — F0390 Unspecified dementia without behavioral disturbance: Secondary | ICD-10-CM | POA: Diagnosis not present

## 2020-06-06 DIAGNOSIS — R262 Difficulty in walking, not elsewhere classified: Secondary | ICD-10-CM | POA: Diagnosis not present

## 2020-06-06 DIAGNOSIS — I2699 Other pulmonary embolism without acute cor pulmonale: Secondary | ICD-10-CM | POA: Diagnosis not present

## 2020-06-06 DIAGNOSIS — I352 Nonrheumatic aortic (valve) stenosis with insufficiency: Secondary | ICD-10-CM | POA: Diagnosis not present

## 2020-06-06 DIAGNOSIS — R279 Unspecified lack of coordination: Secondary | ICD-10-CM | POA: Diagnosis not present

## 2020-06-06 DIAGNOSIS — M6281 Muscle weakness (generalized): Secondary | ICD-10-CM | POA: Diagnosis not present

## 2020-06-07 DIAGNOSIS — M6281 Muscle weakness (generalized): Secondary | ICD-10-CM | POA: Diagnosis not present

## 2020-06-07 DIAGNOSIS — R279 Unspecified lack of coordination: Secondary | ICD-10-CM | POA: Diagnosis not present

## 2020-06-07 DIAGNOSIS — F0390 Unspecified dementia without behavioral disturbance: Secondary | ICD-10-CM | POA: Diagnosis not present

## 2020-06-07 DIAGNOSIS — R262 Difficulty in walking, not elsewhere classified: Secondary | ICD-10-CM | POA: Diagnosis not present

## 2020-06-07 DIAGNOSIS — I352 Nonrheumatic aortic (valve) stenosis with insufficiency: Secondary | ICD-10-CM | POA: Diagnosis not present

## 2020-06-07 DIAGNOSIS — I2699 Other pulmonary embolism without acute cor pulmonale: Secondary | ICD-10-CM | POA: Diagnosis not present

## 2020-06-08 DIAGNOSIS — I2699 Other pulmonary embolism without acute cor pulmonale: Secondary | ICD-10-CM | POA: Diagnosis not present

## 2020-06-08 DIAGNOSIS — I352 Nonrheumatic aortic (valve) stenosis with insufficiency: Secondary | ICD-10-CM | POA: Diagnosis not present

## 2020-06-08 DIAGNOSIS — M6281 Muscle weakness (generalized): Secondary | ICD-10-CM | POA: Diagnosis not present

## 2020-06-08 DIAGNOSIS — F0390 Unspecified dementia without behavioral disturbance: Secondary | ICD-10-CM | POA: Diagnosis not present

## 2020-06-08 DIAGNOSIS — R279 Unspecified lack of coordination: Secondary | ICD-10-CM | POA: Diagnosis not present

## 2020-06-08 DIAGNOSIS — R262 Difficulty in walking, not elsewhere classified: Secondary | ICD-10-CM | POA: Diagnosis not present

## 2020-06-09 DIAGNOSIS — F0390 Unspecified dementia without behavioral disturbance: Secondary | ICD-10-CM | POA: Diagnosis not present

## 2020-06-09 DIAGNOSIS — I482 Chronic atrial fibrillation, unspecified: Secondary | ICD-10-CM | POA: Diagnosis not present

## 2020-06-09 DIAGNOSIS — M790 Rheumatism, unspecified: Secondary | ICD-10-CM | POA: Diagnosis not present

## 2020-06-09 DIAGNOSIS — I352 Nonrheumatic aortic (valve) stenosis with insufficiency: Secondary | ICD-10-CM | POA: Diagnosis not present

## 2020-06-09 DIAGNOSIS — R2689 Other abnormalities of gait and mobility: Secondary | ICD-10-CM | POA: Diagnosis not present

## 2020-06-09 DIAGNOSIS — R2681 Unsteadiness on feet: Secondary | ICD-10-CM | POA: Diagnosis not present

## 2020-06-11 DIAGNOSIS — R2681 Unsteadiness on feet: Secondary | ICD-10-CM | POA: Diagnosis not present

## 2020-06-11 DIAGNOSIS — I482 Chronic atrial fibrillation, unspecified: Secondary | ICD-10-CM | POA: Diagnosis not present

## 2020-06-11 DIAGNOSIS — R2689 Other abnormalities of gait and mobility: Secondary | ICD-10-CM | POA: Diagnosis not present

## 2020-06-11 DIAGNOSIS — M790 Rheumatism, unspecified: Secondary | ICD-10-CM | POA: Diagnosis not present

## 2020-06-11 DIAGNOSIS — I352 Nonrheumatic aortic (valve) stenosis with insufficiency: Secondary | ICD-10-CM | POA: Diagnosis not present

## 2020-06-11 DIAGNOSIS — F0390 Unspecified dementia without behavioral disturbance: Secondary | ICD-10-CM | POA: Diagnosis not present

## 2020-06-12 DIAGNOSIS — I352 Nonrheumatic aortic (valve) stenosis with insufficiency: Secondary | ICD-10-CM | POA: Diagnosis not present

## 2020-06-12 DIAGNOSIS — R2689 Other abnormalities of gait and mobility: Secondary | ICD-10-CM | POA: Diagnosis not present

## 2020-06-12 DIAGNOSIS — M790 Rheumatism, unspecified: Secondary | ICD-10-CM | POA: Diagnosis not present

## 2020-06-12 DIAGNOSIS — I482 Chronic atrial fibrillation, unspecified: Secondary | ICD-10-CM | POA: Diagnosis not present

## 2020-06-12 DIAGNOSIS — F0390 Unspecified dementia without behavioral disturbance: Secondary | ICD-10-CM | POA: Diagnosis not present

## 2020-06-12 DIAGNOSIS — R2681 Unsteadiness on feet: Secondary | ICD-10-CM | POA: Diagnosis not present

## 2020-06-13 DIAGNOSIS — R2681 Unsteadiness on feet: Secondary | ICD-10-CM | POA: Diagnosis not present

## 2020-06-13 DIAGNOSIS — I352 Nonrheumatic aortic (valve) stenosis with insufficiency: Secondary | ICD-10-CM | POA: Diagnosis not present

## 2020-06-13 DIAGNOSIS — I482 Chronic atrial fibrillation, unspecified: Secondary | ICD-10-CM | POA: Diagnosis not present

## 2020-06-13 DIAGNOSIS — R2689 Other abnormalities of gait and mobility: Secondary | ICD-10-CM | POA: Diagnosis not present

## 2020-06-13 DIAGNOSIS — F0390 Unspecified dementia without behavioral disturbance: Secondary | ICD-10-CM | POA: Diagnosis not present

## 2020-06-13 DIAGNOSIS — M790 Rheumatism, unspecified: Secondary | ICD-10-CM | POA: Diagnosis not present

## 2020-06-14 DIAGNOSIS — M790 Rheumatism, unspecified: Secondary | ICD-10-CM | POA: Diagnosis not present

## 2020-06-14 DIAGNOSIS — I352 Nonrheumatic aortic (valve) stenosis with insufficiency: Secondary | ICD-10-CM | POA: Diagnosis not present

## 2020-06-14 DIAGNOSIS — F0390 Unspecified dementia without behavioral disturbance: Secondary | ICD-10-CM | POA: Diagnosis not present

## 2020-06-14 DIAGNOSIS — R2689 Other abnormalities of gait and mobility: Secondary | ICD-10-CM | POA: Diagnosis not present

## 2020-06-14 DIAGNOSIS — I482 Chronic atrial fibrillation, unspecified: Secondary | ICD-10-CM | POA: Diagnosis not present

## 2020-06-14 DIAGNOSIS — R2681 Unsteadiness on feet: Secondary | ICD-10-CM | POA: Diagnosis not present

## 2020-06-15 DIAGNOSIS — M6281 Muscle weakness (generalized): Secondary | ICD-10-CM | POA: Diagnosis not present

## 2020-06-15 DIAGNOSIS — E559 Vitamin D deficiency, unspecified: Secondary | ICD-10-CM | POA: Diagnosis not present

## 2020-06-15 DIAGNOSIS — K5900 Constipation, unspecified: Secondary | ICD-10-CM | POA: Diagnosis not present

## 2020-06-15 DIAGNOSIS — I10 Essential (primary) hypertension: Secondary | ICD-10-CM | POA: Diagnosis not present

## 2020-06-15 DIAGNOSIS — F331 Major depressive disorder, recurrent, moderate: Secondary | ICD-10-CM | POA: Diagnosis not present

## 2020-06-29 DIAGNOSIS — M6281 Muscle weakness (generalized): Secondary | ICD-10-CM | POA: Diagnosis not present

## 2020-06-29 DIAGNOSIS — F419 Anxiety disorder, unspecified: Secondary | ICD-10-CM | POA: Diagnosis not present

## 2020-06-29 DIAGNOSIS — F329 Major depressive disorder, single episode, unspecified: Secondary | ICD-10-CM | POA: Diagnosis not present

## 2020-06-29 DIAGNOSIS — I10 Essential (primary) hypertension: Secondary | ICD-10-CM | POA: Diagnosis not present

## 2020-06-29 DIAGNOSIS — D649 Anemia, unspecified: Secondary | ICD-10-CM | POA: Diagnosis not present

## 2020-06-29 DIAGNOSIS — E559 Vitamin D deficiency, unspecified: Secondary | ICD-10-CM | POA: Diagnosis not present

## 2020-06-29 DIAGNOSIS — R269 Unspecified abnormalities of gait and mobility: Secondary | ICD-10-CM | POA: Diagnosis not present

## 2020-07-13 DIAGNOSIS — F331 Major depressive disorder, recurrent, moderate: Secondary | ICD-10-CM | POA: Diagnosis not present

## 2020-07-13 DIAGNOSIS — H409 Unspecified glaucoma: Secondary | ICD-10-CM | POA: Diagnosis not present

## 2020-07-13 DIAGNOSIS — E559 Vitamin D deficiency, unspecified: Secondary | ICD-10-CM | POA: Diagnosis not present

## 2020-07-13 DIAGNOSIS — K5900 Constipation, unspecified: Secondary | ICD-10-CM | POA: Diagnosis not present

## 2020-07-13 DIAGNOSIS — M6281 Muscle weakness (generalized): Secondary | ICD-10-CM | POA: Diagnosis not present

## 2020-07-13 DIAGNOSIS — D649 Anemia, unspecified: Secondary | ICD-10-CM | POA: Diagnosis not present

## 2020-07-13 DIAGNOSIS — I10 Essential (primary) hypertension: Secondary | ICD-10-CM | POA: Diagnosis not present

## 2020-07-13 DIAGNOSIS — F0390 Unspecified dementia without behavioral disturbance: Secondary | ICD-10-CM | POA: Diagnosis not present

## 2020-07-13 DIAGNOSIS — I4891 Unspecified atrial fibrillation: Secondary | ICD-10-CM | POA: Diagnosis not present

## 2020-07-28 DIAGNOSIS — Z961 Presence of intraocular lens: Secondary | ICD-10-CM | POA: Diagnosis not present

## 2020-07-28 DIAGNOSIS — H1712 Central corneal opacity, left eye: Secondary | ICD-10-CM | POA: Diagnosis not present

## 2020-07-28 DIAGNOSIS — H401134 Primary open-angle glaucoma, bilateral, indeterminate stage: Secondary | ICD-10-CM | POA: Diagnosis not present

## 2020-07-28 DIAGNOSIS — H04123 Dry eye syndrome of bilateral lacrimal glands: Secondary | ICD-10-CM | POA: Diagnosis not present

## 2020-08-02 DIAGNOSIS — I7091 Generalized atherosclerosis: Secondary | ICD-10-CM | POA: Diagnosis not present

## 2020-08-03 DIAGNOSIS — M1990 Unspecified osteoarthritis, unspecified site: Secondary | ICD-10-CM | POA: Diagnosis not present

## 2020-08-03 DIAGNOSIS — E559 Vitamin D deficiency, unspecified: Secondary | ICD-10-CM | POA: Diagnosis not present

## 2020-08-03 DIAGNOSIS — M6281 Muscle weakness (generalized): Secondary | ICD-10-CM | POA: Diagnosis not present

## 2020-08-03 DIAGNOSIS — F0390 Unspecified dementia without behavioral disturbance: Secondary | ICD-10-CM | POA: Diagnosis not present

## 2020-08-03 DIAGNOSIS — I10 Essential (primary) hypertension: Secondary | ICD-10-CM | POA: Diagnosis not present

## 2020-08-03 DIAGNOSIS — D649 Anemia, unspecified: Secondary | ICD-10-CM | POA: Diagnosis not present

## 2020-08-08 DIAGNOSIS — R296 Repeated falls: Secondary | ICD-10-CM | POA: Diagnosis not present

## 2020-08-08 DIAGNOSIS — F331 Major depressive disorder, recurrent, moderate: Secondary | ICD-10-CM | POA: Diagnosis not present

## 2020-08-08 DIAGNOSIS — R269 Unspecified abnormalities of gait and mobility: Secondary | ICD-10-CM | POA: Diagnosis not present

## 2020-08-09 DIAGNOSIS — I10 Essential (primary) hypertension: Secondary | ICD-10-CM | POA: Diagnosis not present

## 2020-08-09 DIAGNOSIS — E559 Vitamin D deficiency, unspecified: Secondary | ICD-10-CM | POA: Diagnosis not present

## 2020-08-28 DIAGNOSIS — M6281 Muscle weakness (generalized): Secondary | ICD-10-CM | POA: Diagnosis not present

## 2020-08-28 DIAGNOSIS — K5900 Constipation, unspecified: Secondary | ICD-10-CM | POA: Diagnosis not present

## 2020-08-28 DIAGNOSIS — D649 Anemia, unspecified: Secondary | ICD-10-CM | POA: Diagnosis not present

## 2020-08-28 DIAGNOSIS — H409 Unspecified glaucoma: Secondary | ICD-10-CM | POA: Diagnosis not present

## 2020-08-28 DIAGNOSIS — H04123 Dry eye syndrome of bilateral lacrimal glands: Secondary | ICD-10-CM | POA: Diagnosis not present

## 2020-08-28 DIAGNOSIS — I10 Essential (primary) hypertension: Secondary | ICD-10-CM | POA: Diagnosis not present

## 2020-08-28 DIAGNOSIS — I4891 Unspecified atrial fibrillation: Secondary | ICD-10-CM | POA: Diagnosis not present

## 2020-08-31 DIAGNOSIS — M6281 Muscle weakness (generalized): Secondary | ICD-10-CM | POA: Diagnosis not present

## 2020-08-31 DIAGNOSIS — F0390 Unspecified dementia without behavioral disturbance: Secondary | ICD-10-CM | POA: Diagnosis not present

## 2020-08-31 DIAGNOSIS — D649 Anemia, unspecified: Secondary | ICD-10-CM | POA: Diagnosis not present

## 2020-08-31 DIAGNOSIS — E559 Vitamin D deficiency, unspecified: Secondary | ICD-10-CM | POA: Diagnosis not present

## 2020-08-31 DIAGNOSIS — F329 Major depressive disorder, single episode, unspecified: Secondary | ICD-10-CM | POA: Diagnosis not present

## 2020-08-31 DIAGNOSIS — I10 Essential (primary) hypertension: Secondary | ICD-10-CM | POA: Diagnosis not present

## 2020-09-08 DIAGNOSIS — Z888 Allergy status to other drugs, medicaments and biological substances status: Secondary | ICD-10-CM | POA: Diagnosis not present

## 2020-09-08 DIAGNOSIS — R296 Repeated falls: Secondary | ICD-10-CM | POA: Diagnosis not present

## 2020-09-08 DIAGNOSIS — Z7901 Long term (current) use of anticoagulants: Secondary | ICD-10-CM | POA: Diagnosis not present

## 2020-09-08 DIAGNOSIS — R4182 Altered mental status, unspecified: Secondary | ICD-10-CM | POA: Diagnosis not present

## 2020-09-08 DIAGNOSIS — M5030 Other cervical disc degeneration, unspecified cervical region: Secondary | ICD-10-CM | POA: Diagnosis not present

## 2020-09-08 DIAGNOSIS — S0081XA Abrasion of other part of head, initial encounter: Secondary | ICD-10-CM | POA: Diagnosis not present

## 2020-09-08 DIAGNOSIS — I10 Essential (primary) hypertension: Secondary | ICD-10-CM | POA: Diagnosis not present

## 2020-09-08 DIAGNOSIS — F0390 Unspecified dementia without behavioral disturbance: Secondary | ICD-10-CM | POA: Diagnosis not present

## 2020-09-08 DIAGNOSIS — I482 Chronic atrial fibrillation, unspecified: Secondary | ICD-10-CM | POA: Diagnosis not present

## 2020-09-08 DIAGNOSIS — R269 Unspecified abnormalities of gait and mobility: Secondary | ICD-10-CM | POA: Diagnosis not present

## 2020-09-08 DIAGNOSIS — W0110XA Fall on same level from slipping, tripping and stumbling with subsequent striking against unspecified object, initial encounter: Secondary | ICD-10-CM | POA: Diagnosis not present

## 2020-09-08 DIAGNOSIS — Z7401 Bed confinement status: Secondary | ICD-10-CM | POA: Diagnosis not present

## 2020-09-08 DIAGNOSIS — Z9181 History of falling: Secondary | ICD-10-CM | POA: Diagnosis not present

## 2020-09-08 DIAGNOSIS — S0990XA Unspecified injury of head, initial encounter: Secondary | ICD-10-CM | POA: Diagnosis not present

## 2020-09-08 DIAGNOSIS — W19XXXA Unspecified fall, initial encounter: Secondary | ICD-10-CM | POA: Diagnosis not present

## 2020-09-08 DIAGNOSIS — Y92129 Unspecified place in nursing home as the place of occurrence of the external cause: Secondary | ICD-10-CM | POA: Diagnosis not present

## 2020-09-08 DIAGNOSIS — Z880 Allergy status to penicillin: Secondary | ICD-10-CM | POA: Diagnosis not present

## 2020-10-03 DIAGNOSIS — B351 Tinea unguium: Secondary | ICD-10-CM | POA: Diagnosis not present

## 2020-10-03 DIAGNOSIS — I7091 Generalized atherosclerosis: Secondary | ICD-10-CM | POA: Diagnosis not present

## 2020-10-05 DIAGNOSIS — E559 Vitamin D deficiency, unspecified: Secondary | ICD-10-CM | POA: Diagnosis not present

## 2020-10-05 DIAGNOSIS — M6281 Muscle weakness (generalized): Secondary | ICD-10-CM | POA: Diagnosis not present

## 2020-10-05 DIAGNOSIS — I10 Essential (primary) hypertension: Secondary | ICD-10-CM | POA: Diagnosis not present

## 2020-10-05 DIAGNOSIS — I4891 Unspecified atrial fibrillation: Secondary | ICD-10-CM | POA: Diagnosis not present

## 2020-10-05 DIAGNOSIS — D649 Anemia, unspecified: Secondary | ICD-10-CM | POA: Diagnosis not present

## 2020-10-05 DIAGNOSIS — M1990 Unspecified osteoarthritis, unspecified site: Secondary | ICD-10-CM | POA: Diagnosis not present

## 2020-10-05 DIAGNOSIS — F0390 Unspecified dementia without behavioral disturbance: Secondary | ICD-10-CM | POA: Diagnosis not present

## 2020-10-06 DIAGNOSIS — I10 Essential (primary) hypertension: Secondary | ICD-10-CM | POA: Diagnosis not present

## 2020-10-06 DIAGNOSIS — I4891 Unspecified atrial fibrillation: Secondary | ICD-10-CM | POA: Diagnosis not present

## 2020-10-06 DIAGNOSIS — M6281 Muscle weakness (generalized): Secondary | ICD-10-CM | POA: Diagnosis not present

## 2020-10-06 DIAGNOSIS — R269 Unspecified abnormalities of gait and mobility: Secondary | ICD-10-CM | POA: Diagnosis not present

## 2020-10-06 DIAGNOSIS — F0390 Unspecified dementia without behavioral disturbance: Secondary | ICD-10-CM | POA: Diagnosis not present

## 2020-10-06 DIAGNOSIS — E559 Vitamin D deficiency, unspecified: Secondary | ICD-10-CM | POA: Diagnosis not present

## 2020-10-06 DIAGNOSIS — H04123 Dry eye syndrome of bilateral lacrimal glands: Secondary | ICD-10-CM | POA: Diagnosis not present

## 2020-10-06 DIAGNOSIS — K5900 Constipation, unspecified: Secondary | ICD-10-CM | POA: Diagnosis not present

## 2020-10-06 DIAGNOSIS — H409 Unspecified glaucoma: Secondary | ICD-10-CM | POA: Diagnosis not present

## 2020-10-07 DIAGNOSIS — M25531 Pain in right wrist: Secondary | ICD-10-CM | POA: Diagnosis not present

## 2020-10-07 DIAGNOSIS — M79641 Pain in right hand: Secondary | ICD-10-CM | POA: Diagnosis not present

## 2020-10-09 DIAGNOSIS — M25531 Pain in right wrist: Secondary | ICD-10-CM | POA: Diagnosis not present

## 2020-10-09 DIAGNOSIS — M19039 Primary osteoarthritis, unspecified wrist: Secondary | ICD-10-CM | POA: Diagnosis not present

## 2020-10-09 DIAGNOSIS — M19031 Primary osteoarthritis, right wrist: Secondary | ICD-10-CM | POA: Diagnosis not present

## 2020-10-20 DIAGNOSIS — H401134 Primary open-angle glaucoma, bilateral, indeterminate stage: Secondary | ICD-10-CM | POA: Diagnosis not present

## 2020-10-20 DIAGNOSIS — Z961 Presence of intraocular lens: Secondary | ICD-10-CM | POA: Diagnosis not present

## 2020-10-20 DIAGNOSIS — H1712 Central corneal opacity, left eye: Secondary | ICD-10-CM | POA: Diagnosis not present

## 2020-10-20 DIAGNOSIS — H04123 Dry eye syndrome of bilateral lacrimal glands: Secondary | ICD-10-CM | POA: Diagnosis not present

## 2020-10-31 DIAGNOSIS — F331 Major depressive disorder, recurrent, moderate: Secondary | ICD-10-CM | POA: Diagnosis not present

## 2020-11-03 DIAGNOSIS — I482 Chronic atrial fibrillation, unspecified: Secondary | ICD-10-CM | POA: Diagnosis not present

## 2020-11-03 DIAGNOSIS — I10 Essential (primary) hypertension: Secondary | ICD-10-CM | POA: Diagnosis not present

## 2020-11-03 DIAGNOSIS — G309 Alzheimer's disease, unspecified: Secondary | ICD-10-CM | POA: Diagnosis not present

## 2020-11-03 DIAGNOSIS — F331 Major depressive disorder, recurrent, moderate: Secondary | ICD-10-CM | POA: Diagnosis not present

## 2020-12-13 DIAGNOSIS — Z9181 History of falling: Secondary | ICD-10-CM | POA: Diagnosis not present

## 2020-12-15 DIAGNOSIS — G309 Alzheimer's disease, unspecified: Secondary | ICD-10-CM | POA: Diagnosis not present

## 2020-12-15 DIAGNOSIS — F339 Major depressive disorder, recurrent, unspecified: Secondary | ICD-10-CM | POA: Diagnosis not present

## 2020-12-28 DIAGNOSIS — F331 Major depressive disorder, recurrent, moderate: Secondary | ICD-10-CM | POA: Diagnosis not present

## 2021-01-05 DIAGNOSIS — H1712 Central corneal opacity, left eye: Secondary | ICD-10-CM | POA: Diagnosis not present

## 2021-01-05 DIAGNOSIS — H401134 Primary open-angle glaucoma, bilateral, indeterminate stage: Secondary | ICD-10-CM | POA: Diagnosis not present

## 2021-01-05 DIAGNOSIS — H524 Presbyopia: Secondary | ICD-10-CM | POA: Diagnosis not present

## 2021-01-05 DIAGNOSIS — Z961 Presence of intraocular lens: Secondary | ICD-10-CM | POA: Diagnosis not present

## 2021-02-20 DIAGNOSIS — F331 Major depressive disorder, recurrent, moderate: Secondary | ICD-10-CM | POA: Diagnosis not present

## 2021-02-20 DIAGNOSIS — G3109 Other frontotemporal dementia: Secondary | ICD-10-CM | POA: Diagnosis not present

## 2021-03-07 DIAGNOSIS — S3993XA Unspecified injury of pelvis, initial encounter: Secondary | ICD-10-CM | POA: Diagnosis not present

## 2021-03-07 DIAGNOSIS — E785 Hyperlipidemia, unspecified: Secondary | ICD-10-CM | POA: Diagnosis not present

## 2021-03-07 DIAGNOSIS — M160 Bilateral primary osteoarthritis of hip: Secondary | ICD-10-CM | POA: Diagnosis not present

## 2021-03-07 DIAGNOSIS — M47815 Spondylosis without myelopathy or radiculopathy, thoracolumbar region: Secondary | ICD-10-CM | POA: Diagnosis not present

## 2021-03-07 DIAGNOSIS — W1839XA Other fall on same level, initial encounter: Secondary | ICD-10-CM | POA: Diagnosis not present

## 2021-03-07 DIAGNOSIS — I517 Cardiomegaly: Secondary | ICD-10-CM | POA: Diagnosis not present

## 2021-03-07 DIAGNOSIS — M4804 Spinal stenosis, thoracic region: Secondary | ICD-10-CM | POA: Diagnosis not present

## 2021-03-07 DIAGNOSIS — I4891 Unspecified atrial fibrillation: Secondary | ICD-10-CM | POA: Diagnosis not present

## 2021-03-07 DIAGNOSIS — S299XXA Unspecified injury of thorax, initial encounter: Secondary | ICD-10-CM | POA: Diagnosis not present

## 2021-03-07 DIAGNOSIS — S199XXA Unspecified injury of neck, initial encounter: Secondary | ICD-10-CM | POA: Diagnosis not present

## 2021-03-07 DIAGNOSIS — M47898 Other spondylosis, sacral and sacrococcygeal region: Secondary | ICD-10-CM | POA: Diagnosis not present

## 2021-03-07 DIAGNOSIS — I6529 Occlusion and stenosis of unspecified carotid artery: Secondary | ICD-10-CM | POA: Diagnosis not present

## 2021-03-07 DIAGNOSIS — I2510 Atherosclerotic heart disease of native coronary artery without angina pectoris: Secondary | ICD-10-CM | POA: Diagnosis not present

## 2021-03-07 DIAGNOSIS — R9082 White matter disease, unspecified: Secondary | ICD-10-CM | POA: Diagnosis not present

## 2021-03-07 DIAGNOSIS — F03C0 Unspecified dementia, severe, without behavioral disturbance, psychotic disturbance, mood disturbance, and anxiety: Secondary | ICD-10-CM | POA: Diagnosis not present

## 2021-03-07 DIAGNOSIS — S0990XA Unspecified injury of head, initial encounter: Secondary | ICD-10-CM | POA: Diagnosis not present

## 2021-03-07 DIAGNOSIS — R402412 Glasgow coma scale score 13-15, at arrival to emergency department: Secondary | ICD-10-CM | POA: Diagnosis not present

## 2021-03-07 DIAGNOSIS — Y998 Other external cause status: Secondary | ICD-10-CM | POA: Diagnosis not present

## 2021-03-07 DIAGNOSIS — M1990 Unspecified osteoarthritis, unspecified site: Secondary | ICD-10-CM | POA: Diagnosis not present

## 2021-03-07 DIAGNOSIS — I700 Atherosclerosis of aorta: Secondary | ICD-10-CM | POA: Diagnosis not present

## 2021-03-07 DIAGNOSIS — Z7901 Long term (current) use of anticoagulants: Secondary | ICD-10-CM | POA: Diagnosis not present

## 2021-03-07 DIAGNOSIS — Z20822 Contact with and (suspected) exposure to covid-19: Secondary | ICD-10-CM | POA: Diagnosis not present

## 2021-03-07 DIAGNOSIS — I10 Essential (primary) hypertension: Secondary | ICD-10-CM | POA: Diagnosis not present

## 2021-03-07 DIAGNOSIS — W19XXXA Unspecified fall, initial encounter: Secondary | ICD-10-CM | POA: Diagnosis not present

## 2021-03-07 DIAGNOSIS — R079 Chest pain, unspecified: Secondary | ICD-10-CM | POA: Diagnosis not present

## 2021-03-07 DIAGNOSIS — S066XAA Traumatic subarachnoid hemorrhage with loss of consciousness status unknown, initial encounter: Secondary | ICD-10-CM | POA: Diagnosis not present

## 2021-03-07 DIAGNOSIS — R4182 Altered mental status, unspecified: Secondary | ICD-10-CM | POA: Diagnosis not present

## 2021-03-07 DIAGNOSIS — Z043 Encounter for examination and observation following other accident: Secondary | ICD-10-CM | POA: Diagnosis not present

## 2021-03-07 DIAGNOSIS — G319 Degenerative disease of nervous system, unspecified: Secondary | ICD-10-CM | POA: Diagnosis not present

## 2021-03-07 DIAGNOSIS — F0280 Dementia in other diseases classified elsewhere without behavioral disturbance: Secondary | ICD-10-CM | POA: Diagnosis not present

## 2021-03-07 DIAGNOSIS — S066X0A Traumatic subarachnoid hemorrhage without loss of consciousness, initial encounter: Secondary | ICD-10-CM | POA: Diagnosis not present

## 2021-03-07 DIAGNOSIS — N83202 Unspecified ovarian cyst, left side: Secondary | ICD-10-CM | POA: Diagnosis not present

## 2021-03-08 DIAGNOSIS — S066XAA Traumatic subarachnoid hemorrhage with loss of consciousness status unknown, initial encounter: Secondary | ICD-10-CM | POA: Diagnosis not present

## 2021-03-08 DIAGNOSIS — S066X0A Traumatic subarachnoid hemorrhage without loss of consciousness, initial encounter: Secondary | ICD-10-CM | POA: Diagnosis not present

## 2021-03-08 DIAGNOSIS — R1032 Left lower quadrant pain: Secondary | ICD-10-CM | POA: Diagnosis not present

## 2021-03-08 DIAGNOSIS — Z043 Encounter for examination and observation following other accident: Secondary | ICD-10-CM | POA: Diagnosis not present

## 2021-03-10 DIAGNOSIS — S066XAA Traumatic subarachnoid hemorrhage with loss of consciousness status unknown, initial encounter: Secondary | ICD-10-CM | POA: Diagnosis not present

## 2021-03-11 DIAGNOSIS — M4804 Spinal stenosis, thoracic region: Secondary | ICD-10-CM | POA: Diagnosis not present

## 2021-03-11 DIAGNOSIS — F03C0 Unspecified dementia, severe, without behavioral disturbance, psychotic disturbance, mood disturbance, and anxiety: Secondary | ICD-10-CM | POA: Diagnosis not present

## 2021-03-11 DIAGNOSIS — N83202 Unspecified ovarian cyst, left side: Secondary | ICD-10-CM | POA: Diagnosis not present

## 2021-03-11 DIAGNOSIS — Z7901 Long term (current) use of anticoagulants: Secondary | ICD-10-CM | POA: Diagnosis not present

## 2021-03-11 DIAGNOSIS — M47898 Other spondylosis, sacral and sacrococcygeal region: Secondary | ICD-10-CM | POA: Diagnosis not present

## 2021-03-11 DIAGNOSIS — S066XAA Traumatic subarachnoid hemorrhage with loss of consciousness status unknown, initial encounter: Secondary | ICD-10-CM | POA: Diagnosis not present

## 2021-03-11 DIAGNOSIS — I4891 Unspecified atrial fibrillation: Secondary | ICD-10-CM | POA: Diagnosis not present

## 2021-03-11 DIAGNOSIS — S066X0A Traumatic subarachnoid hemorrhage without loss of consciousness, initial encounter: Secondary | ICD-10-CM | POA: Diagnosis not present

## 2021-03-11 DIAGNOSIS — R402412 Glasgow coma scale score 13-15, at arrival to emergency department: Secondary | ICD-10-CM | POA: Diagnosis not present

## 2021-03-11 DIAGNOSIS — M47815 Spondylosis without myelopathy or radiculopathy, thoracolumbar region: Secondary | ICD-10-CM | POA: Diagnosis not present

## 2021-03-11 DIAGNOSIS — I700 Atherosclerosis of aorta: Secondary | ICD-10-CM | POA: Diagnosis not present

## 2021-03-11 DIAGNOSIS — M160 Bilateral primary osteoarthritis of hip: Secondary | ICD-10-CM | POA: Diagnosis not present

## 2021-03-12 DIAGNOSIS — M160 Bilateral primary osteoarthritis of hip: Secondary | ICD-10-CM | POA: Diagnosis not present

## 2021-03-12 DIAGNOSIS — F03C0 Unspecified dementia, severe, without behavioral disturbance, psychotic disturbance, mood disturbance, and anxiety: Secondary | ICD-10-CM | POA: Diagnosis not present

## 2021-03-12 DIAGNOSIS — M47898 Other spondylosis, sacral and sacrococcygeal region: Secondary | ICD-10-CM | POA: Diagnosis not present

## 2021-03-12 DIAGNOSIS — N83202 Unspecified ovarian cyst, left side: Secondary | ICD-10-CM | POA: Diagnosis not present

## 2021-03-12 DIAGNOSIS — I4891 Unspecified atrial fibrillation: Secondary | ICD-10-CM | POA: Diagnosis not present

## 2021-03-12 DIAGNOSIS — I700 Atherosclerosis of aorta: Secondary | ICD-10-CM | POA: Diagnosis not present

## 2021-03-12 DIAGNOSIS — S066X0A Traumatic subarachnoid hemorrhage without loss of consciousness, initial encounter: Secondary | ICD-10-CM | POA: Diagnosis not present

## 2021-03-12 DIAGNOSIS — M47815 Spondylosis without myelopathy or radiculopathy, thoracolumbar region: Secondary | ICD-10-CM | POA: Diagnosis not present

## 2021-03-12 DIAGNOSIS — Z7901 Long term (current) use of anticoagulants: Secondary | ICD-10-CM | POA: Diagnosis not present

## 2021-03-12 DIAGNOSIS — R402412 Glasgow coma scale score 13-15, at arrival to emergency department: Secondary | ICD-10-CM | POA: Diagnosis not present

## 2021-03-12 DIAGNOSIS — M4804 Spinal stenosis, thoracic region: Secondary | ICD-10-CM | POA: Diagnosis not present

## 2021-03-13 DIAGNOSIS — S066X0S Traumatic subarachnoid hemorrhage without loss of consciousness, sequela: Secondary | ICD-10-CM | POA: Diagnosis not present

## 2021-03-13 DIAGNOSIS — S066X0A Traumatic subarachnoid hemorrhage without loss of consciousness, initial encounter: Secondary | ICD-10-CM | POA: Diagnosis not present

## 2021-03-13 DIAGNOSIS — Z7901 Long term (current) use of anticoagulants: Secondary | ICD-10-CM | POA: Diagnosis not present

## 2021-03-13 DIAGNOSIS — N83202 Unspecified ovarian cyst, left side: Secondary | ICD-10-CM | POA: Diagnosis not present

## 2021-03-13 DIAGNOSIS — M4696 Unspecified inflammatory spondylopathy, lumbar region: Secondary | ICD-10-CM | POA: Diagnosis not present

## 2021-03-13 DIAGNOSIS — I609 Nontraumatic subarachnoid hemorrhage, unspecified: Secondary | ICD-10-CM | POA: Diagnosis not present

## 2021-03-13 DIAGNOSIS — Z961 Presence of intraocular lens: Secondary | ICD-10-CM | POA: Diagnosis not present

## 2021-03-13 DIAGNOSIS — H1712 Central corneal opacity, left eye: Secondary | ICD-10-CM | POA: Diagnosis not present

## 2021-03-13 DIAGNOSIS — I700 Atherosclerosis of aorta: Secondary | ICD-10-CM | POA: Diagnosis not present

## 2021-03-13 DIAGNOSIS — M47815 Spondylosis without myelopathy or radiculopathy, thoracolumbar region: Secondary | ICD-10-CM | POA: Diagnosis not present

## 2021-03-13 DIAGNOSIS — H401134 Primary open-angle glaucoma, bilateral, indeterminate stage: Secondary | ICD-10-CM | POA: Diagnosis not present

## 2021-03-13 DIAGNOSIS — M19042 Primary osteoarthritis, left hand: Secondary | ICD-10-CM | POA: Diagnosis not present

## 2021-03-13 DIAGNOSIS — M19011 Primary osteoarthritis, right shoulder: Secondary | ICD-10-CM | POA: Diagnosis not present

## 2021-03-13 DIAGNOSIS — R402412 Glasgow coma scale score 13-15, at arrival to emergency department: Secondary | ICD-10-CM | POA: Diagnosis not present

## 2021-03-13 DIAGNOSIS — G3109 Other frontotemporal dementia: Secondary | ICD-10-CM | POA: Diagnosis not present

## 2021-03-13 DIAGNOSIS — I482 Chronic atrial fibrillation, unspecified: Secondary | ICD-10-CM | POA: Diagnosis not present

## 2021-03-13 DIAGNOSIS — I352 Nonrheumatic aortic (valve) stenosis with insufficiency: Secondary | ICD-10-CM | POA: Diagnosis not present

## 2021-03-13 DIAGNOSIS — M4804 Spinal stenosis, thoracic region: Secondary | ICD-10-CM | POA: Diagnosis not present

## 2021-03-13 DIAGNOSIS — M19041 Primary osteoarthritis, right hand: Secondary | ICD-10-CM | POA: Diagnosis not present

## 2021-03-13 DIAGNOSIS — F03C0 Unspecified dementia, severe, without behavioral disturbance, psychotic disturbance, mood disturbance, and anxiety: Secondary | ICD-10-CM | POA: Diagnosis not present

## 2021-03-13 DIAGNOSIS — M790 Rheumatism, unspecified: Secondary | ICD-10-CM | POA: Diagnosis not present

## 2021-03-13 DIAGNOSIS — Z743 Need for continuous supervision: Secondary | ICD-10-CM | POA: Diagnosis not present

## 2021-03-13 DIAGNOSIS — I4891 Unspecified atrial fibrillation: Secondary | ICD-10-CM | POA: Diagnosis not present

## 2021-03-13 DIAGNOSIS — I2699 Other pulmonary embolism without acute cor pulmonale: Secondary | ICD-10-CM | POA: Diagnosis not present

## 2021-03-13 DIAGNOSIS — F331 Major depressive disorder, recurrent, moderate: Secondary | ICD-10-CM | POA: Diagnosis not present

## 2021-03-13 DIAGNOSIS — R29898 Other symptoms and signs involving the musculoskeletal system: Secondary | ICD-10-CM | POA: Diagnosis not present

## 2021-03-13 DIAGNOSIS — M160 Bilateral primary osteoarthritis of hip: Secondary | ICD-10-CM | POA: Diagnosis not present

## 2021-03-13 DIAGNOSIS — M47898 Other spondylosis, sacral and sacrococcygeal region: Secondary | ICD-10-CM | POA: Diagnosis not present

## 2021-03-20 DIAGNOSIS — I609 Nontraumatic subarachnoid hemorrhage, unspecified: Secondary | ICD-10-CM | POA: Diagnosis not present

## 2021-03-21 DIAGNOSIS — Z961 Presence of intraocular lens: Secondary | ICD-10-CM | POA: Diagnosis not present

## 2021-03-21 DIAGNOSIS — H1712 Central corneal opacity, left eye: Secondary | ICD-10-CM | POA: Diagnosis not present

## 2021-03-21 DIAGNOSIS — H401134 Primary open-angle glaucoma, bilateral, indeterminate stage: Secondary | ICD-10-CM | POA: Diagnosis not present

## 2021-03-26 DIAGNOSIS — G3109 Other frontotemporal dementia: Secondary | ICD-10-CM | POA: Diagnosis not present

## 2021-03-26 DIAGNOSIS — F331 Major depressive disorder, recurrent, moderate: Secondary | ICD-10-CM | POA: Diagnosis not present

## 2021-04-02 DIAGNOSIS — Z741 Need for assistance with personal care: Secondary | ICD-10-CM | POA: Diagnosis not present

## 2021-04-02 DIAGNOSIS — M6281 Muscle weakness (generalized): Secondary | ICD-10-CM | POA: Diagnosis not present

## 2021-04-02 DIAGNOSIS — R2689 Other abnormalities of gait and mobility: Secondary | ICD-10-CM | POA: Diagnosis not present

## 2021-04-03 DIAGNOSIS — R2689 Other abnormalities of gait and mobility: Secondary | ICD-10-CM | POA: Diagnosis not present

## 2021-04-03 DIAGNOSIS — M6281 Muscle weakness (generalized): Secondary | ICD-10-CM | POA: Diagnosis not present

## 2021-04-03 DIAGNOSIS — Z741 Need for assistance with personal care: Secondary | ICD-10-CM | POA: Diagnosis not present

## 2021-04-04 DIAGNOSIS — I7091 Generalized atherosclerosis: Secondary | ICD-10-CM | POA: Diagnosis not present

## 2021-04-04 DIAGNOSIS — Z741 Need for assistance with personal care: Secondary | ICD-10-CM | POA: Diagnosis not present

## 2021-04-04 DIAGNOSIS — R2689 Other abnormalities of gait and mobility: Secondary | ICD-10-CM | POA: Diagnosis not present

## 2021-04-04 DIAGNOSIS — M6281 Muscle weakness (generalized): Secondary | ICD-10-CM | POA: Diagnosis not present

## 2021-04-04 DIAGNOSIS — L603 Nail dystrophy: Secondary | ICD-10-CM | POA: Diagnosis not present

## 2021-04-05 DIAGNOSIS — M6281 Muscle weakness (generalized): Secondary | ICD-10-CM | POA: Diagnosis not present

## 2021-04-05 DIAGNOSIS — Z741 Need for assistance with personal care: Secondary | ICD-10-CM | POA: Diagnosis not present

## 2021-04-05 DIAGNOSIS — R2689 Other abnormalities of gait and mobility: Secondary | ICD-10-CM | POA: Diagnosis not present

## 2021-04-06 DIAGNOSIS — R2689 Other abnormalities of gait and mobility: Secondary | ICD-10-CM | POA: Diagnosis not present

## 2021-04-06 DIAGNOSIS — M6281 Muscle weakness (generalized): Secondary | ICD-10-CM | POA: Diagnosis not present

## 2021-04-06 DIAGNOSIS — Z741 Need for assistance with personal care: Secondary | ICD-10-CM | POA: Diagnosis not present

## 2021-04-09 DIAGNOSIS — M6281 Muscle weakness (generalized): Secondary | ICD-10-CM | POA: Diagnosis not present

## 2021-04-09 DIAGNOSIS — Z741 Need for assistance with personal care: Secondary | ICD-10-CM | POA: Diagnosis not present

## 2021-04-09 DIAGNOSIS — R2689 Other abnormalities of gait and mobility: Secondary | ICD-10-CM | POA: Diagnosis not present

## 2021-04-10 DIAGNOSIS — Z741 Need for assistance with personal care: Secondary | ICD-10-CM | POA: Diagnosis not present

## 2021-04-10 DIAGNOSIS — R2689 Other abnormalities of gait and mobility: Secondary | ICD-10-CM | POA: Diagnosis not present

## 2021-04-10 DIAGNOSIS — M6281 Muscle weakness (generalized): Secondary | ICD-10-CM | POA: Diagnosis not present

## 2021-04-18 DIAGNOSIS — I482 Chronic atrial fibrillation, unspecified: Secondary | ICD-10-CM | POA: Diagnosis not present

## 2021-04-18 DIAGNOSIS — G309 Alzheimer's disease, unspecified: Secondary | ICD-10-CM | POA: Diagnosis not present

## 2021-04-18 DIAGNOSIS — F331 Major depressive disorder, recurrent, moderate: Secondary | ICD-10-CM | POA: Diagnosis not present

## 2021-04-18 DIAGNOSIS — G3109 Other frontotemporal dementia: Secondary | ICD-10-CM | POA: Diagnosis not present

## 2021-04-18 DIAGNOSIS — F0280 Dementia in other diseases classified elsewhere without behavioral disturbance: Secondary | ICD-10-CM | POA: Diagnosis not present

## 2021-04-18 DIAGNOSIS — I10 Essential (primary) hypertension: Secondary | ICD-10-CM | POA: Diagnosis not present

## 2021-04-24 DIAGNOSIS — H9193 Unspecified hearing loss, bilateral: Secondary | ICD-10-CM | POA: Diagnosis not present

## 2021-05-16 DIAGNOSIS — G309 Alzheimer's disease, unspecified: Secondary | ICD-10-CM | POA: Diagnosis not present

## 2021-05-16 DIAGNOSIS — F0280 Dementia in other diseases classified elsewhere without behavioral disturbance: Secondary | ICD-10-CM | POA: Diagnosis not present

## 2021-05-16 DIAGNOSIS — R296 Repeated falls: Secondary | ICD-10-CM | POA: Diagnosis not present

## 2021-05-22 DIAGNOSIS — G3109 Other frontotemporal dementia: Secondary | ICD-10-CM | POA: Diagnosis not present

## 2021-05-22 DIAGNOSIS — F331 Major depressive disorder, recurrent, moderate: Secondary | ICD-10-CM | POA: Diagnosis not present

## 2021-06-21 DIAGNOSIS — I482 Chronic atrial fibrillation, unspecified: Secondary | ICD-10-CM | POA: Diagnosis not present

## 2021-06-21 DIAGNOSIS — F331 Major depressive disorder, recurrent, moderate: Secondary | ICD-10-CM | POA: Diagnosis not present

## 2021-06-21 DIAGNOSIS — I10 Essential (primary) hypertension: Secondary | ICD-10-CM | POA: Diagnosis not present

## 2021-06-21 DIAGNOSIS — G309 Alzheimer's disease, unspecified: Secondary | ICD-10-CM | POA: Diagnosis not present

## 2021-07-17 DIAGNOSIS — F331 Major depressive disorder, recurrent, moderate: Secondary | ICD-10-CM | POA: Diagnosis not present

## 2021-07-17 DIAGNOSIS — G3109 Other frontotemporal dementia: Secondary | ICD-10-CM | POA: Diagnosis not present

## 2021-07-18 DIAGNOSIS — H401134 Primary open-angle glaucoma, bilateral, indeterminate stage: Secondary | ICD-10-CM | POA: Diagnosis not present

## 2021-07-18 DIAGNOSIS — Z961 Presence of intraocular lens: Secondary | ICD-10-CM | POA: Diagnosis not present

## 2021-07-18 DIAGNOSIS — H1712 Central corneal opacity, left eye: Secondary | ICD-10-CM | POA: Diagnosis not present

## 2021-07-24 DIAGNOSIS — I350 Nonrheumatic aortic (valve) stenosis: Secondary | ICD-10-CM | POA: Diagnosis not present

## 2021-07-24 DIAGNOSIS — F331 Major depressive disorder, recurrent, moderate: Secondary | ICD-10-CM | POA: Diagnosis not present

## 2021-07-24 DIAGNOSIS — E559 Vitamin D deficiency, unspecified: Secondary | ICD-10-CM | POA: Diagnosis not present

## 2021-07-31 DIAGNOSIS — I7091 Generalized atherosclerosis: Secondary | ICD-10-CM | POA: Diagnosis not present

## 2021-07-31 DIAGNOSIS — B351 Tinea unguium: Secondary | ICD-10-CM | POA: Diagnosis not present

## 2021-08-15 DIAGNOSIS — N390 Urinary tract infection, site not specified: Secondary | ICD-10-CM | POA: Diagnosis not present

## 2021-09-05 DIAGNOSIS — G3109 Other frontotemporal dementia: Secondary | ICD-10-CM | POA: Diagnosis not present

## 2021-09-05 DIAGNOSIS — F331 Major depressive disorder, recurrent, moderate: Secondary | ICD-10-CM | POA: Diagnosis not present

## 2021-09-25 DIAGNOSIS — G3109 Other frontotemporal dementia: Secondary | ICD-10-CM | POA: Diagnosis not present

## 2021-09-25 DIAGNOSIS — F331 Major depressive disorder, recurrent, moderate: Secondary | ICD-10-CM | POA: Diagnosis not present

## 2021-10-01 DIAGNOSIS — R2689 Other abnormalities of gait and mobility: Secondary | ICD-10-CM | POA: Diagnosis not present

## 2021-10-02 DIAGNOSIS — R2689 Other abnormalities of gait and mobility: Secondary | ICD-10-CM | POA: Diagnosis not present

## 2021-10-03 DIAGNOSIS — R2689 Other abnormalities of gait and mobility: Secondary | ICD-10-CM | POA: Diagnosis not present

## 2021-10-04 DIAGNOSIS — R2689 Other abnormalities of gait and mobility: Secondary | ICD-10-CM | POA: Diagnosis not present

## 2021-10-05 DIAGNOSIS — R2689 Other abnormalities of gait and mobility: Secondary | ICD-10-CM | POA: Diagnosis not present

## 2021-10-09 DIAGNOSIS — R2689 Other abnormalities of gait and mobility: Secondary | ICD-10-CM | POA: Diagnosis not present

## 2021-10-10 DIAGNOSIS — R2689 Other abnormalities of gait and mobility: Secondary | ICD-10-CM | POA: Diagnosis not present

## 2021-10-11 DIAGNOSIS — R2689 Other abnormalities of gait and mobility: Secondary | ICD-10-CM | POA: Diagnosis not present

## 2021-10-12 DIAGNOSIS — G309 Alzheimer's disease, unspecified: Secondary | ICD-10-CM | POA: Diagnosis not present

## 2021-10-12 DIAGNOSIS — R2689 Other abnormalities of gait and mobility: Secondary | ICD-10-CM | POA: Diagnosis not present

## 2021-10-12 DIAGNOSIS — U071 COVID-19: Secondary | ICD-10-CM | POA: Diagnosis not present

## 2021-10-12 DIAGNOSIS — F0280 Dementia in other diseases classified elsewhere without behavioral disturbance: Secondary | ICD-10-CM | POA: Diagnosis not present

## 2021-10-12 DIAGNOSIS — I10 Essential (primary) hypertension: Secondary | ICD-10-CM | POA: Diagnosis not present

## 2021-10-13 DIAGNOSIS — R2689 Other abnormalities of gait and mobility: Secondary | ICD-10-CM | POA: Diagnosis not present

## 2021-10-15 DIAGNOSIS — U071 COVID-19: Secondary | ICD-10-CM | POA: Diagnosis not present

## 2021-10-15 DIAGNOSIS — R2689 Other abnormalities of gait and mobility: Secondary | ICD-10-CM | POA: Diagnosis not present

## 2021-10-15 DIAGNOSIS — F0280 Dementia in other diseases classified elsewhere without behavioral disturbance: Secondary | ICD-10-CM | POA: Diagnosis not present

## 2021-10-15 DIAGNOSIS — G309 Alzheimer's disease, unspecified: Secondary | ICD-10-CM | POA: Diagnosis not present

## 2021-10-15 DIAGNOSIS — R7989 Other specified abnormal findings of blood chemistry: Secondary | ICD-10-CM | POA: Diagnosis not present

## 2021-10-15 DIAGNOSIS — R7982 Elevated C-reactive protein (CRP): Secondary | ICD-10-CM | POA: Diagnosis not present

## 2021-10-16 DIAGNOSIS — R2689 Other abnormalities of gait and mobility: Secondary | ICD-10-CM | POA: Diagnosis not present

## 2021-10-17 DIAGNOSIS — R2689 Other abnormalities of gait and mobility: Secondary | ICD-10-CM | POA: Diagnosis not present

## 2021-10-18 DIAGNOSIS — R2689 Other abnormalities of gait and mobility: Secondary | ICD-10-CM | POA: Diagnosis not present

## 2021-10-19 DIAGNOSIS — F0280 Dementia in other diseases classified elsewhere without behavioral disturbance: Secondary | ICD-10-CM | POA: Diagnosis not present

## 2021-10-19 DIAGNOSIS — U071 COVID-19: Secondary | ICD-10-CM | POA: Diagnosis not present

## 2021-10-19 DIAGNOSIS — R7989 Other specified abnormal findings of blood chemistry: Secondary | ICD-10-CM | POA: Diagnosis not present

## 2021-10-19 DIAGNOSIS — R7982 Elevated C-reactive protein (CRP): Secondary | ICD-10-CM | POA: Diagnosis not present

## 2021-10-19 DIAGNOSIS — G309 Alzheimer's disease, unspecified: Secondary | ICD-10-CM | POA: Diagnosis not present

## 2021-10-21 DIAGNOSIS — R2689 Other abnormalities of gait and mobility: Secondary | ICD-10-CM | POA: Diagnosis not present

## 2021-10-22 DIAGNOSIS — G309 Alzheimer's disease, unspecified: Secondary | ICD-10-CM | POA: Diagnosis not present

## 2021-10-22 DIAGNOSIS — F0280 Dementia in other diseases classified elsewhere without behavioral disturbance: Secondary | ICD-10-CM | POA: Diagnosis not present

## 2021-10-22 DIAGNOSIS — R2689 Other abnormalities of gait and mobility: Secondary | ICD-10-CM | POA: Diagnosis not present

## 2021-10-22 DIAGNOSIS — R7982 Elevated C-reactive protein (CRP): Secondary | ICD-10-CM | POA: Diagnosis not present

## 2021-10-22 DIAGNOSIS — R7989 Other specified abnormal findings of blood chemistry: Secondary | ICD-10-CM | POA: Diagnosis not present

## 2021-10-22 DIAGNOSIS — U071 COVID-19: Secondary | ICD-10-CM | POA: Diagnosis not present

## 2021-10-23 DIAGNOSIS — I482 Chronic atrial fibrillation, unspecified: Secondary | ICD-10-CM | POA: Diagnosis not present

## 2021-10-23 DIAGNOSIS — F331 Major depressive disorder, recurrent, moderate: Secondary | ICD-10-CM | POA: Diagnosis not present

## 2021-10-23 DIAGNOSIS — E559 Vitamin D deficiency, unspecified: Secondary | ICD-10-CM | POA: Diagnosis not present

## 2021-10-24 DIAGNOSIS — R2689 Other abnormalities of gait and mobility: Secondary | ICD-10-CM | POA: Diagnosis not present

## 2021-10-25 DIAGNOSIS — R2689 Other abnormalities of gait and mobility: Secondary | ICD-10-CM | POA: Diagnosis not present

## 2021-10-26 DIAGNOSIS — R2689 Other abnormalities of gait and mobility: Secondary | ICD-10-CM | POA: Diagnosis not present

## 2021-10-27 DIAGNOSIS — R2689 Other abnormalities of gait and mobility: Secondary | ICD-10-CM | POA: Diagnosis not present

## 2021-10-29 DIAGNOSIS — F331 Major depressive disorder, recurrent, moderate: Secondary | ICD-10-CM | POA: Diagnosis not present

## 2021-10-29 DIAGNOSIS — G3109 Other frontotemporal dementia: Secondary | ICD-10-CM | POA: Diagnosis not present

## 2021-10-29 DIAGNOSIS — R2689 Other abnormalities of gait and mobility: Secondary | ICD-10-CM | POA: Diagnosis not present

## 2021-10-30 DIAGNOSIS — R2689 Other abnormalities of gait and mobility: Secondary | ICD-10-CM | POA: Diagnosis not present

## 2021-11-15 DIAGNOSIS — I7091 Generalized atherosclerosis: Secondary | ICD-10-CM | POA: Diagnosis not present

## 2021-11-15 DIAGNOSIS — B351 Tinea unguium: Secondary | ICD-10-CM | POA: Diagnosis not present

## 2021-11-20 DIAGNOSIS — G3109 Other frontotemporal dementia: Secondary | ICD-10-CM | POA: Diagnosis not present

## 2021-11-20 DIAGNOSIS — F331 Major depressive disorder, recurrent, moderate: Secondary | ICD-10-CM | POA: Diagnosis not present

## 2021-11-23 DIAGNOSIS — F0280 Dementia in other diseases classified elsewhere without behavioral disturbance: Secondary | ICD-10-CM | POA: Diagnosis not present

## 2021-11-23 DIAGNOSIS — G309 Alzheimer's disease, unspecified: Secondary | ICD-10-CM | POA: Diagnosis not present

## 2021-11-23 DIAGNOSIS — R7989 Other specified abnormal findings of blood chemistry: Secondary | ICD-10-CM | POA: Diagnosis not present

## 2021-11-23 DIAGNOSIS — I482 Chronic atrial fibrillation, unspecified: Secondary | ICD-10-CM | POA: Diagnosis not present

## 2021-11-23 DIAGNOSIS — Z8616 Personal history of COVID-19: Secondary | ICD-10-CM | POA: Diagnosis not present

## 2021-11-23 DIAGNOSIS — Z5181 Encounter for therapeutic drug level monitoring: Secondary | ICD-10-CM | POA: Diagnosis not present

## 2021-12-07 DIAGNOSIS — R7989 Other specified abnormal findings of blood chemistry: Secondary | ICD-10-CM | POA: Diagnosis not present

## 2021-12-07 DIAGNOSIS — I2699 Other pulmonary embolism without acute cor pulmonale: Secondary | ICD-10-CM | POA: Diagnosis not present

## 2021-12-07 DIAGNOSIS — Z8616 Personal history of COVID-19: Secondary | ICD-10-CM | POA: Diagnosis not present

## 2021-12-19 DIAGNOSIS — Z8616 Personal history of COVID-19: Secondary | ICD-10-CM | POA: Diagnosis not present

## 2021-12-19 DIAGNOSIS — F0280 Dementia in other diseases classified elsewhere without behavioral disturbance: Secondary | ICD-10-CM | POA: Diagnosis not present

## 2021-12-19 DIAGNOSIS — I4891 Unspecified atrial fibrillation: Secondary | ICD-10-CM | POA: Diagnosis not present

## 2021-12-19 DIAGNOSIS — I482 Chronic atrial fibrillation, unspecified: Secondary | ICD-10-CM | POA: Diagnosis not present

## 2021-12-19 DIAGNOSIS — R2681 Unsteadiness on feet: Secondary | ICD-10-CM | POA: Diagnosis not present

## 2021-12-19 DIAGNOSIS — I10 Essential (primary) hypertension: Secondary | ICD-10-CM | POA: Diagnosis not present

## 2021-12-19 DIAGNOSIS — G309 Alzheimer's disease, unspecified: Secondary | ICD-10-CM | POA: Diagnosis not present

## 2021-12-19 DIAGNOSIS — E559 Vitamin D deficiency, unspecified: Secondary | ICD-10-CM | POA: Diagnosis not present

## 2021-12-19 DIAGNOSIS — F331 Major depressive disorder, recurrent, moderate: Secondary | ICD-10-CM | POA: Diagnosis not present

## 2021-12-28 DIAGNOSIS — F331 Major depressive disorder, recurrent, moderate: Secondary | ICD-10-CM | POA: Diagnosis not present

## 2021-12-28 DIAGNOSIS — G3109 Other frontotemporal dementia: Secondary | ICD-10-CM | POA: Diagnosis not present

## 2021-12-30 DIAGNOSIS — Z741 Need for assistance with personal care: Secondary | ICD-10-CM | POA: Diagnosis not present

## 2021-12-30 DIAGNOSIS — M6281 Muscle weakness (generalized): Secondary | ICD-10-CM | POA: Diagnosis not present

## 2021-12-31 DIAGNOSIS — M6281 Muscle weakness (generalized): Secondary | ICD-10-CM | POA: Diagnosis not present

## 2021-12-31 DIAGNOSIS — Z741 Need for assistance with personal care: Secondary | ICD-10-CM | POA: Diagnosis not present

## 2022-01-01 DIAGNOSIS — M6281 Muscle weakness (generalized): Secondary | ICD-10-CM | POA: Diagnosis not present

## 2022-01-01 DIAGNOSIS — Z741 Need for assistance with personal care: Secondary | ICD-10-CM | POA: Diagnosis not present

## 2022-01-02 DIAGNOSIS — Z741 Need for assistance with personal care: Secondary | ICD-10-CM | POA: Diagnosis not present

## 2022-01-02 DIAGNOSIS — M6281 Muscle weakness (generalized): Secondary | ICD-10-CM | POA: Diagnosis not present

## 2022-01-03 DIAGNOSIS — Z741 Need for assistance with personal care: Secondary | ICD-10-CM | POA: Diagnosis not present

## 2022-01-03 DIAGNOSIS — M6281 Muscle weakness (generalized): Secondary | ICD-10-CM | POA: Diagnosis not present

## 2022-01-06 DIAGNOSIS — M6281 Muscle weakness (generalized): Secondary | ICD-10-CM | POA: Diagnosis not present

## 2022-01-06 DIAGNOSIS — Z741 Need for assistance with personal care: Secondary | ICD-10-CM | POA: Diagnosis not present

## 2022-01-07 DIAGNOSIS — M6281 Muscle weakness (generalized): Secondary | ICD-10-CM | POA: Diagnosis not present

## 2022-01-07 DIAGNOSIS — Z741 Need for assistance with personal care: Secondary | ICD-10-CM | POA: Diagnosis not present

## 2022-01-08 DIAGNOSIS — Z741 Need for assistance with personal care: Secondary | ICD-10-CM | POA: Diagnosis not present

## 2022-01-08 DIAGNOSIS — M6281 Muscle weakness (generalized): Secondary | ICD-10-CM | POA: Diagnosis not present

## 2022-01-09 DIAGNOSIS — M6281 Muscle weakness (generalized): Secondary | ICD-10-CM | POA: Diagnosis not present

## 2022-01-09 DIAGNOSIS — Z741 Need for assistance with personal care: Secondary | ICD-10-CM | POA: Diagnosis not present

## 2022-01-10 DIAGNOSIS — M6281 Muscle weakness (generalized): Secondary | ICD-10-CM | POA: Diagnosis not present

## 2022-01-10 DIAGNOSIS — Z741 Need for assistance with personal care: Secondary | ICD-10-CM | POA: Diagnosis not present

## 2022-01-14 DIAGNOSIS — M6281 Muscle weakness (generalized): Secondary | ICD-10-CM | POA: Diagnosis not present

## 2022-01-14 DIAGNOSIS — I10 Essential (primary) hypertension: Secondary | ICD-10-CM | POA: Diagnosis not present

## 2022-01-14 DIAGNOSIS — F331 Major depressive disorder, recurrent, moderate: Secondary | ICD-10-CM | POA: Diagnosis not present

## 2022-01-14 DIAGNOSIS — I4891 Unspecified atrial fibrillation: Secondary | ICD-10-CM | POA: Diagnosis not present

## 2022-01-14 DIAGNOSIS — Z741 Need for assistance with personal care: Secondary | ICD-10-CM | POA: Diagnosis not present

## 2022-01-15 DIAGNOSIS — Z741 Need for assistance with personal care: Secondary | ICD-10-CM | POA: Diagnosis not present

## 2022-01-15 DIAGNOSIS — M6281 Muscle weakness (generalized): Secondary | ICD-10-CM | POA: Diagnosis not present

## 2022-01-16 DIAGNOSIS — Z741 Need for assistance with personal care: Secondary | ICD-10-CM | POA: Diagnosis not present

## 2022-01-16 DIAGNOSIS — M6281 Muscle weakness (generalized): Secondary | ICD-10-CM | POA: Diagnosis not present

## 2022-01-17 DIAGNOSIS — Z741 Need for assistance with personal care: Secondary | ICD-10-CM | POA: Diagnosis not present

## 2022-01-17 DIAGNOSIS — M6281 Muscle weakness (generalized): Secondary | ICD-10-CM | POA: Diagnosis not present

## 2022-01-18 DIAGNOSIS — Z741 Need for assistance with personal care: Secondary | ICD-10-CM | POA: Diagnosis not present

## 2022-01-18 DIAGNOSIS — M6281 Muscle weakness (generalized): Secondary | ICD-10-CM | POA: Diagnosis not present

## 2022-01-21 DIAGNOSIS — M6281 Muscle weakness (generalized): Secondary | ICD-10-CM | POA: Diagnosis not present

## 2022-01-21 DIAGNOSIS — Z741 Need for assistance with personal care: Secondary | ICD-10-CM | POA: Diagnosis not present

## 2022-01-22 DIAGNOSIS — Z741 Need for assistance with personal care: Secondary | ICD-10-CM | POA: Diagnosis not present

## 2022-01-22 DIAGNOSIS — M6281 Muscle weakness (generalized): Secondary | ICD-10-CM | POA: Diagnosis not present

## 2022-01-23 DIAGNOSIS — H5203 Hypermetropia, bilateral: Secondary | ICD-10-CM | POA: Diagnosis not present

## 2022-01-23 DIAGNOSIS — Z961 Presence of intraocular lens: Secondary | ICD-10-CM | POA: Diagnosis not present

## 2022-01-23 DIAGNOSIS — Z741 Need for assistance with personal care: Secondary | ICD-10-CM | POA: Diagnosis not present

## 2022-01-23 DIAGNOSIS — M6281 Muscle weakness (generalized): Secondary | ICD-10-CM | POA: Diagnosis not present

## 2022-01-23 DIAGNOSIS — H401134 Primary open-angle glaucoma, bilateral, indeterminate stage: Secondary | ICD-10-CM | POA: Diagnosis not present

## 2022-01-23 DIAGNOSIS — H1712 Central corneal opacity, left eye: Secondary | ICD-10-CM | POA: Diagnosis not present

## 2022-01-24 DIAGNOSIS — M6281 Muscle weakness (generalized): Secondary | ICD-10-CM | POA: Diagnosis not present

## 2022-01-24 DIAGNOSIS — Z741 Need for assistance with personal care: Secondary | ICD-10-CM | POA: Diagnosis not present

## 2022-01-25 DIAGNOSIS — Z741 Need for assistance with personal care: Secondary | ICD-10-CM | POA: Diagnosis not present

## 2022-01-25 DIAGNOSIS — M6281 Muscle weakness (generalized): Secondary | ICD-10-CM | POA: Diagnosis not present

## 2022-01-28 DIAGNOSIS — G3109 Other frontotemporal dementia: Secondary | ICD-10-CM | POA: Diagnosis not present

## 2022-01-28 DIAGNOSIS — F331 Major depressive disorder, recurrent, moderate: Secondary | ICD-10-CM | POA: Diagnosis not present

## 2022-02-25 DIAGNOSIS — G3109 Other frontotemporal dementia: Secondary | ICD-10-CM | POA: Diagnosis not present

## 2022-02-25 DIAGNOSIS — F331 Major depressive disorder, recurrent, moderate: Secondary | ICD-10-CM | POA: Diagnosis not present

## 2022-03-20 DIAGNOSIS — E559 Vitamin D deficiency, unspecified: Secondary | ICD-10-CM | POA: Diagnosis not present

## 2022-03-20 DIAGNOSIS — G309 Alzheimer's disease, unspecified: Secondary | ICD-10-CM | POA: Diagnosis not present

## 2022-03-20 DIAGNOSIS — F331 Major depressive disorder, recurrent, moderate: Secondary | ICD-10-CM | POA: Diagnosis not present

## 2022-03-20 DIAGNOSIS — I350 Nonrheumatic aortic (valve) stenosis: Secondary | ICD-10-CM | POA: Diagnosis not present

## 2022-03-20 DIAGNOSIS — I482 Chronic atrial fibrillation, unspecified: Secondary | ICD-10-CM | POA: Diagnosis not present

## 2022-03-20 DIAGNOSIS — I10 Essential (primary) hypertension: Secondary | ICD-10-CM | POA: Diagnosis not present

## 2022-03-20 DIAGNOSIS — F0280 Dementia in other diseases classified elsewhere without behavioral disturbance: Secondary | ICD-10-CM | POA: Diagnosis not present

## 2022-03-27 DIAGNOSIS — B351 Tinea unguium: Secondary | ICD-10-CM | POA: Diagnosis not present

## 2022-03-27 DIAGNOSIS — I7091 Generalized atherosclerosis: Secondary | ICD-10-CM | POA: Diagnosis not present

## 2022-04-01 DIAGNOSIS — Z79899 Other long term (current) drug therapy: Secondary | ICD-10-CM | POA: Diagnosis not present

## 2022-04-01 DIAGNOSIS — H409 Unspecified glaucoma: Secondary | ICD-10-CM | POA: Diagnosis not present

## 2022-04-02 DIAGNOSIS — G3109 Other frontotemporal dementia: Secondary | ICD-10-CM | POA: Diagnosis not present

## 2022-04-02 DIAGNOSIS — F331 Major depressive disorder, recurrent, moderate: Secondary | ICD-10-CM | POA: Diagnosis not present

## 2022-04-24 DIAGNOSIS — I959 Hypotension, unspecified: Secondary | ICD-10-CM | POA: Diagnosis not present

## 2022-04-24 DIAGNOSIS — I10 Essential (primary) hypertension: Secondary | ICD-10-CM | POA: Diagnosis not present

## 2022-05-13 DIAGNOSIS — F32A Depression, unspecified: Secondary | ICD-10-CM | POA: Diagnosis not present

## 2022-05-13 DIAGNOSIS — F331 Major depressive disorder, recurrent, moderate: Secondary | ICD-10-CM | POA: Diagnosis not present

## 2022-05-13 DIAGNOSIS — G309 Alzheimer's disease, unspecified: Secondary | ICD-10-CM | POA: Diagnosis not present

## 2022-05-13 DIAGNOSIS — G3109 Other frontotemporal dementia: Secondary | ICD-10-CM | POA: Diagnosis not present

## 2022-05-15 DIAGNOSIS — I10 Essential (primary) hypertension: Secondary | ICD-10-CM | POA: Diagnosis not present

## 2022-05-16 DIAGNOSIS — M19011 Primary osteoarthritis, right shoulder: Secondary | ICD-10-CM | POA: Diagnosis not present

## 2022-05-16 DIAGNOSIS — M1990 Unspecified osteoarthritis, unspecified site: Secondary | ICD-10-CM | POA: Diagnosis not present

## 2022-05-16 DIAGNOSIS — I10 Essential (primary) hypertension: Secondary | ICD-10-CM | POA: Diagnosis not present

## 2022-05-16 DIAGNOSIS — S066X0S Traumatic subarachnoid hemorrhage without loss of consciousness, sequela: Secondary | ICD-10-CM | POA: Diagnosis not present

## 2022-05-16 DIAGNOSIS — G301 Alzheimer's disease with late onset: Secondary | ICD-10-CM | POA: Diagnosis not present

## 2022-05-16 DIAGNOSIS — I4891 Unspecified atrial fibrillation: Secondary | ICD-10-CM | POA: Diagnosis not present

## 2022-05-16 DIAGNOSIS — I482 Chronic atrial fibrillation, unspecified: Secondary | ICD-10-CM | POA: Diagnosis not present

## 2022-05-17 DIAGNOSIS — Z961 Presence of intraocular lens: Secondary | ICD-10-CM | POA: Diagnosis not present

## 2022-05-17 DIAGNOSIS — H1712 Central corneal opacity, left eye: Secondary | ICD-10-CM | POA: Diagnosis not present

## 2022-05-17 DIAGNOSIS — H401134 Primary open-angle glaucoma, bilateral, indeterminate stage: Secondary | ICD-10-CM | POA: Diagnosis not present

## 2022-05-27 DIAGNOSIS — I7091 Generalized atherosclerosis: Secondary | ICD-10-CM | POA: Diagnosis not present

## 2022-05-27 DIAGNOSIS — B351 Tinea unguium: Secondary | ICD-10-CM | POA: Diagnosis not present

## 2022-05-27 DIAGNOSIS — Z79899 Other long term (current) drug therapy: Secondary | ICD-10-CM | POA: Diagnosis not present

## 2022-05-27 DIAGNOSIS — E559 Vitamin D deficiency, unspecified: Secondary | ICD-10-CM | POA: Diagnosis not present

## 2022-06-03 DIAGNOSIS — Z79899 Other long term (current) drug therapy: Secondary | ICD-10-CM | POA: Diagnosis not present

## 2022-06-03 DIAGNOSIS — Z5181 Encounter for therapeutic drug level monitoring: Secondary | ICD-10-CM | POA: Diagnosis not present

## 2022-06-03 DIAGNOSIS — I10 Essential (primary) hypertension: Secondary | ICD-10-CM | POA: Diagnosis not present

## 2022-06-07 DIAGNOSIS — R1311 Dysphagia, oral phase: Secondary | ICD-10-CM | POA: Diagnosis not present

## 2022-06-10 DIAGNOSIS — R1311 Dysphagia, oral phase: Secondary | ICD-10-CM | POA: Diagnosis not present

## 2022-06-10 DIAGNOSIS — G3109 Other frontotemporal dementia: Secondary | ICD-10-CM | POA: Diagnosis not present

## 2022-06-10 DIAGNOSIS — F331 Major depressive disorder, recurrent, moderate: Secondary | ICD-10-CM | POA: Diagnosis not present

## 2022-06-11 DIAGNOSIS — R1311 Dysphagia, oral phase: Secondary | ICD-10-CM | POA: Diagnosis not present

## 2022-06-12 DIAGNOSIS — R1311 Dysphagia, oral phase: Secondary | ICD-10-CM | POA: Diagnosis not present

## 2022-06-13 DIAGNOSIS — R1311 Dysphagia, oral phase: Secondary | ICD-10-CM | POA: Diagnosis not present

## 2022-06-14 DIAGNOSIS — R1311 Dysphagia, oral phase: Secondary | ICD-10-CM | POA: Diagnosis not present

## 2022-06-17 DIAGNOSIS — E559 Vitamin D deficiency, unspecified: Secondary | ICD-10-CM | POA: Diagnosis not present

## 2022-06-17 DIAGNOSIS — Z5181 Encounter for therapeutic drug level monitoring: Secondary | ICD-10-CM | POA: Diagnosis not present

## 2022-06-17 DIAGNOSIS — Z79899 Other long term (current) drug therapy: Secondary | ICD-10-CM | POA: Diagnosis not present

## 2022-06-19 DIAGNOSIS — R1311 Dysphagia, oral phase: Secondary | ICD-10-CM | POA: Diagnosis not present

## 2022-06-20 DIAGNOSIS — R1311 Dysphagia, oral phase: Secondary | ICD-10-CM | POA: Diagnosis not present

## 2022-06-24 DIAGNOSIS — R1311 Dysphagia, oral phase: Secondary | ICD-10-CM | POA: Diagnosis not present

## 2022-06-26 DIAGNOSIS — R1311 Dysphagia, oral phase: Secondary | ICD-10-CM | POA: Diagnosis not present

## 2022-06-27 DIAGNOSIS — R1311 Dysphagia, oral phase: Secondary | ICD-10-CM | POA: Diagnosis not present

## 2022-07-01 DIAGNOSIS — Z79899 Other long term (current) drug therapy: Secondary | ICD-10-CM | POA: Diagnosis not present

## 2022-07-02 DIAGNOSIS — R1311 Dysphagia, oral phase: Secondary | ICD-10-CM | POA: Diagnosis not present

## 2022-07-03 DIAGNOSIS — R1311 Dysphagia, oral phase: Secondary | ICD-10-CM | POA: Diagnosis not present

## 2022-07-07 DIAGNOSIS — R1311 Dysphagia, oral phase: Secondary | ICD-10-CM | POA: Diagnosis not present

## 2022-07-09 DIAGNOSIS — R1311 Dysphagia, oral phase: Secondary | ICD-10-CM | POA: Diagnosis not present

## 2022-07-10 DIAGNOSIS — G3109 Other frontotemporal dementia: Secondary | ICD-10-CM | POA: Diagnosis not present

## 2022-07-10 DIAGNOSIS — F331 Major depressive disorder, recurrent, moderate: Secondary | ICD-10-CM | POA: Diagnosis not present

## 2022-07-10 DIAGNOSIS — G309 Alzheimer's disease, unspecified: Secondary | ICD-10-CM | POA: Diagnosis not present

## 2022-07-10 DIAGNOSIS — I609 Nontraumatic subarachnoid hemorrhage, unspecified: Secondary | ICD-10-CM | POA: Diagnosis not present

## 2022-07-10 DIAGNOSIS — R1311 Dysphagia, oral phase: Secondary | ICD-10-CM | POA: Diagnosis not present

## 2022-07-11 DIAGNOSIS — H1712 Central corneal opacity, left eye: Secondary | ICD-10-CM | POA: Diagnosis not present

## 2022-07-11 DIAGNOSIS — R1311 Dysphagia, oral phase: Secondary | ICD-10-CM | POA: Diagnosis not present

## 2022-07-11 DIAGNOSIS — I609 Nontraumatic subarachnoid hemorrhage, unspecified: Secondary | ICD-10-CM | POA: Diagnosis not present

## 2022-07-11 DIAGNOSIS — H401134 Primary open-angle glaucoma, bilateral, indeterminate stage: Secondary | ICD-10-CM | POA: Diagnosis not present

## 2022-07-11 DIAGNOSIS — Z961 Presence of intraocular lens: Secondary | ICD-10-CM | POA: Diagnosis not present

## 2022-07-11 DIAGNOSIS — G309 Alzheimer's disease, unspecified: Secondary | ICD-10-CM | POA: Diagnosis not present

## 2022-07-12 DIAGNOSIS — I609 Nontraumatic subarachnoid hemorrhage, unspecified: Secondary | ICD-10-CM | POA: Diagnosis not present

## 2022-07-12 DIAGNOSIS — G309 Alzheimer's disease, unspecified: Secondary | ICD-10-CM | POA: Diagnosis not present

## 2022-07-12 DIAGNOSIS — R1311 Dysphagia, oral phase: Secondary | ICD-10-CM | POA: Diagnosis not present

## 2022-07-14 DIAGNOSIS — G309 Alzheimer's disease, unspecified: Secondary | ICD-10-CM | POA: Diagnosis not present

## 2022-07-14 DIAGNOSIS — I609 Nontraumatic subarachnoid hemorrhage, unspecified: Secondary | ICD-10-CM | POA: Diagnosis not present

## 2022-07-14 DIAGNOSIS — R1311 Dysphagia, oral phase: Secondary | ICD-10-CM | POA: Diagnosis not present

## 2022-07-16 DIAGNOSIS — G309 Alzheimer's disease, unspecified: Secondary | ICD-10-CM | POA: Diagnosis not present

## 2022-07-16 DIAGNOSIS — I609 Nontraumatic subarachnoid hemorrhage, unspecified: Secondary | ICD-10-CM | POA: Diagnosis not present

## 2022-07-16 DIAGNOSIS — R1311 Dysphagia, oral phase: Secondary | ICD-10-CM | POA: Diagnosis not present

## 2022-07-17 DIAGNOSIS — R1311 Dysphagia, oral phase: Secondary | ICD-10-CM | POA: Diagnosis not present

## 2022-07-17 DIAGNOSIS — G309 Alzheimer's disease, unspecified: Secondary | ICD-10-CM | POA: Diagnosis not present

## 2022-07-17 DIAGNOSIS — I609 Nontraumatic subarachnoid hemorrhage, unspecified: Secondary | ICD-10-CM | POA: Diagnosis not present

## 2022-07-21 DIAGNOSIS — G309 Alzheimer's disease, unspecified: Secondary | ICD-10-CM | POA: Diagnosis not present

## 2022-07-21 DIAGNOSIS — I609 Nontraumatic subarachnoid hemorrhage, unspecified: Secondary | ICD-10-CM | POA: Diagnosis not present

## 2022-07-21 DIAGNOSIS — R1311 Dysphagia, oral phase: Secondary | ICD-10-CM | POA: Diagnosis not present

## 2022-07-23 DIAGNOSIS — I609 Nontraumatic subarachnoid hemorrhage, unspecified: Secondary | ICD-10-CM | POA: Diagnosis not present

## 2022-07-23 DIAGNOSIS — R1311 Dysphagia, oral phase: Secondary | ICD-10-CM | POA: Diagnosis not present

## 2022-07-23 DIAGNOSIS — G309 Alzheimer's disease, unspecified: Secondary | ICD-10-CM | POA: Diagnosis not present

## 2022-07-24 DIAGNOSIS — I609 Nontraumatic subarachnoid hemorrhage, unspecified: Secondary | ICD-10-CM | POA: Diagnosis not present

## 2022-07-24 DIAGNOSIS — G309 Alzheimer's disease, unspecified: Secondary | ICD-10-CM | POA: Diagnosis not present

## 2022-07-24 DIAGNOSIS — R1311 Dysphagia, oral phase: Secondary | ICD-10-CM | POA: Diagnosis not present

## 2022-07-25 DIAGNOSIS — G309 Alzheimer's disease, unspecified: Secondary | ICD-10-CM | POA: Diagnosis not present

## 2022-07-25 DIAGNOSIS — I609 Nontraumatic subarachnoid hemorrhage, unspecified: Secondary | ICD-10-CM | POA: Diagnosis not present

## 2022-07-25 DIAGNOSIS — R1311 Dysphagia, oral phase: Secondary | ICD-10-CM | POA: Diagnosis not present

## 2022-07-26 DIAGNOSIS — I609 Nontraumatic subarachnoid hemorrhage, unspecified: Secondary | ICD-10-CM | POA: Diagnosis not present

## 2022-07-26 DIAGNOSIS — R1311 Dysphagia, oral phase: Secondary | ICD-10-CM | POA: Diagnosis not present

## 2022-07-26 DIAGNOSIS — G309 Alzheimer's disease, unspecified: Secondary | ICD-10-CM | POA: Diagnosis not present

## 2022-07-30 DIAGNOSIS — Z0100 Encounter for examination of eyes and vision without abnormal findings: Secondary | ICD-10-CM | POA: Diagnosis not present

## 2022-08-01 DIAGNOSIS — I7091 Generalized atherosclerosis: Secondary | ICD-10-CM | POA: Diagnosis not present

## 2022-08-01 DIAGNOSIS — B351 Tinea unguium: Secondary | ICD-10-CM | POA: Diagnosis not present

## 2022-08-05 DIAGNOSIS — G3109 Other frontotemporal dementia: Secondary | ICD-10-CM | POA: Diagnosis not present

## 2022-08-05 DIAGNOSIS — F331 Major depressive disorder, recurrent, moderate: Secondary | ICD-10-CM | POA: Diagnosis not present

## 2022-09-09 DIAGNOSIS — F331 Major depressive disorder, recurrent, moderate: Secondary | ICD-10-CM | POA: Diagnosis not present

## 2022-09-09 DIAGNOSIS — G3101 Pick's disease: Secondary | ICD-10-CM | POA: Diagnosis not present

## 2022-09-16 DIAGNOSIS — F32A Depression, unspecified: Secondary | ICD-10-CM | POA: Diagnosis not present

## 2022-09-16 DIAGNOSIS — I482 Chronic atrial fibrillation, unspecified: Secondary | ICD-10-CM | POA: Diagnosis not present

## 2022-09-16 DIAGNOSIS — I10 Essential (primary) hypertension: Secondary | ICD-10-CM | POA: Diagnosis not present

## 2022-09-26 DIAGNOSIS — H524 Presbyopia: Secondary | ICD-10-CM | POA: Diagnosis not present

## 2022-10-10 DIAGNOSIS — R2689 Other abnormalities of gait and mobility: Secondary | ICD-10-CM | POA: Diagnosis not present

## 2022-10-10 DIAGNOSIS — M6281 Muscle weakness (generalized): Secondary | ICD-10-CM | POA: Diagnosis not present

## 2022-10-10 DIAGNOSIS — R1312 Dysphagia, oropharyngeal phase: Secondary | ICD-10-CM | POA: Diagnosis not present

## 2022-10-11 DIAGNOSIS — R2689 Other abnormalities of gait and mobility: Secondary | ICD-10-CM | POA: Diagnosis not present

## 2022-10-11 DIAGNOSIS — R1312 Dysphagia, oropharyngeal phase: Secondary | ICD-10-CM | POA: Diagnosis not present

## 2022-10-11 DIAGNOSIS — M6281 Muscle weakness (generalized): Secondary | ICD-10-CM | POA: Diagnosis not present

## 2022-10-14 DIAGNOSIS — R2689 Other abnormalities of gait and mobility: Secondary | ICD-10-CM | POA: Diagnosis not present

## 2022-10-14 DIAGNOSIS — R1312 Dysphagia, oropharyngeal phase: Secondary | ICD-10-CM | POA: Diagnosis not present

## 2022-10-14 DIAGNOSIS — M6281 Muscle weakness (generalized): Secondary | ICD-10-CM | POA: Diagnosis not present

## 2022-10-15 DIAGNOSIS — M6281 Muscle weakness (generalized): Secondary | ICD-10-CM | POA: Diagnosis not present

## 2022-10-15 DIAGNOSIS — R1312 Dysphagia, oropharyngeal phase: Secondary | ICD-10-CM | POA: Diagnosis not present

## 2022-10-15 DIAGNOSIS — R2689 Other abnormalities of gait and mobility: Secondary | ICD-10-CM | POA: Diagnosis not present

## 2022-10-16 DIAGNOSIS — R1312 Dysphagia, oropharyngeal phase: Secondary | ICD-10-CM | POA: Diagnosis not present

## 2022-10-16 DIAGNOSIS — R2689 Other abnormalities of gait and mobility: Secondary | ICD-10-CM | POA: Diagnosis not present

## 2022-10-16 DIAGNOSIS — M6281 Muscle weakness (generalized): Secondary | ICD-10-CM | POA: Diagnosis not present

## 2022-10-17 DIAGNOSIS — M6281 Muscle weakness (generalized): Secondary | ICD-10-CM | POA: Diagnosis not present

## 2022-10-17 DIAGNOSIS — R2689 Other abnormalities of gait and mobility: Secondary | ICD-10-CM | POA: Diagnosis not present

## 2022-10-17 DIAGNOSIS — R1312 Dysphagia, oropharyngeal phase: Secondary | ICD-10-CM | POA: Diagnosis not present

## 2022-10-18 DIAGNOSIS — R2689 Other abnormalities of gait and mobility: Secondary | ICD-10-CM | POA: Diagnosis not present

## 2022-10-18 DIAGNOSIS — F32A Depression, unspecified: Secondary | ICD-10-CM | POA: Diagnosis not present

## 2022-10-18 DIAGNOSIS — G4700 Insomnia, unspecified: Secondary | ICD-10-CM | POA: Diagnosis not present

## 2022-10-18 DIAGNOSIS — M6281 Muscle weakness (generalized): Secondary | ICD-10-CM | POA: Diagnosis not present

## 2022-10-18 DIAGNOSIS — R1312 Dysphagia, oropharyngeal phase: Secondary | ICD-10-CM | POA: Diagnosis not present

## 2022-10-19 DIAGNOSIS — R1312 Dysphagia, oropharyngeal phase: Secondary | ICD-10-CM | POA: Diagnosis not present

## 2022-10-19 DIAGNOSIS — M6281 Muscle weakness (generalized): Secondary | ICD-10-CM | POA: Diagnosis not present

## 2022-10-19 DIAGNOSIS — R2689 Other abnormalities of gait and mobility: Secondary | ICD-10-CM | POA: Diagnosis not present

## 2022-10-20 DIAGNOSIS — R2689 Other abnormalities of gait and mobility: Secondary | ICD-10-CM | POA: Diagnosis not present

## 2022-10-20 DIAGNOSIS — M6281 Muscle weakness (generalized): Secondary | ICD-10-CM | POA: Diagnosis not present

## 2022-10-20 DIAGNOSIS — R1312 Dysphagia, oropharyngeal phase: Secondary | ICD-10-CM | POA: Diagnosis not present

## 2022-10-21 DIAGNOSIS — M6281 Muscle weakness (generalized): Secondary | ICD-10-CM | POA: Diagnosis not present

## 2022-10-21 DIAGNOSIS — R2689 Other abnormalities of gait and mobility: Secondary | ICD-10-CM | POA: Diagnosis not present

## 2022-10-21 DIAGNOSIS — R1312 Dysphagia, oropharyngeal phase: Secondary | ICD-10-CM | POA: Diagnosis not present

## 2022-10-22 DIAGNOSIS — M6281 Muscle weakness (generalized): Secondary | ICD-10-CM | POA: Diagnosis not present

## 2022-10-22 DIAGNOSIS — R1312 Dysphagia, oropharyngeal phase: Secondary | ICD-10-CM | POA: Diagnosis not present

## 2022-10-22 DIAGNOSIS — R2689 Other abnormalities of gait and mobility: Secondary | ICD-10-CM | POA: Diagnosis not present

## 2022-10-23 DIAGNOSIS — R2689 Other abnormalities of gait and mobility: Secondary | ICD-10-CM | POA: Diagnosis not present

## 2022-10-23 DIAGNOSIS — R1312 Dysphagia, oropharyngeal phase: Secondary | ICD-10-CM | POA: Diagnosis not present

## 2022-10-23 DIAGNOSIS — M6281 Muscle weakness (generalized): Secondary | ICD-10-CM | POA: Diagnosis not present

## 2022-10-24 DIAGNOSIS — R2689 Other abnormalities of gait and mobility: Secondary | ICD-10-CM | POA: Diagnosis not present

## 2022-10-24 DIAGNOSIS — R1312 Dysphagia, oropharyngeal phase: Secondary | ICD-10-CM | POA: Diagnosis not present

## 2022-10-24 DIAGNOSIS — M6281 Muscle weakness (generalized): Secondary | ICD-10-CM | POA: Diagnosis not present

## 2022-10-25 DIAGNOSIS — R2689 Other abnormalities of gait and mobility: Secondary | ICD-10-CM | POA: Diagnosis not present

## 2022-10-25 DIAGNOSIS — R1312 Dysphagia, oropharyngeal phase: Secondary | ICD-10-CM | POA: Diagnosis not present

## 2022-10-25 DIAGNOSIS — M6281 Muscle weakness (generalized): Secondary | ICD-10-CM | POA: Diagnosis not present

## 2022-10-26 DIAGNOSIS — R2689 Other abnormalities of gait and mobility: Secondary | ICD-10-CM | POA: Diagnosis not present

## 2022-10-26 DIAGNOSIS — M6281 Muscle weakness (generalized): Secondary | ICD-10-CM | POA: Diagnosis not present

## 2022-10-26 DIAGNOSIS — R1312 Dysphagia, oropharyngeal phase: Secondary | ICD-10-CM | POA: Diagnosis not present

## 2022-10-29 DIAGNOSIS — R1312 Dysphagia, oropharyngeal phase: Secondary | ICD-10-CM | POA: Diagnosis not present

## 2022-10-29 DIAGNOSIS — R2689 Other abnormalities of gait and mobility: Secondary | ICD-10-CM | POA: Diagnosis not present

## 2022-10-29 DIAGNOSIS — M6281 Muscle weakness (generalized): Secondary | ICD-10-CM | POA: Diagnosis not present

## 2022-10-30 DIAGNOSIS — R2689 Other abnormalities of gait and mobility: Secondary | ICD-10-CM | POA: Diagnosis not present

## 2022-10-30 DIAGNOSIS — M6281 Muscle weakness (generalized): Secondary | ICD-10-CM | POA: Diagnosis not present

## 2022-10-30 DIAGNOSIS — R1312 Dysphagia, oropharyngeal phase: Secondary | ICD-10-CM | POA: Diagnosis not present

## 2022-10-31 ENCOUNTER — Other Ambulatory Visit: Payer: Self-pay

## 2022-10-31 ENCOUNTER — Emergency Department (HOSPITAL_COMMUNITY)
Admission: EM | Admit: 2022-10-31 | Discharge: 2022-11-01 | Disposition: A | Discharge status: Home / Self Care | Payer: Medicare HMO | Attending: Emergency Medicine | Admitting: Emergency Medicine

## 2022-10-31 DIAGNOSIS — S0990XA Unspecified injury of head, initial encounter: Secondary | ICD-10-CM | POA: Diagnosis present

## 2022-10-31 DIAGNOSIS — S0181XA Laceration without foreign body of other part of head, initial encounter: Secondary | ICD-10-CM | POA: Diagnosis not present

## 2022-10-31 DIAGNOSIS — Y92129 Unspecified place in nursing home as the place of occurrence of the external cause: Secondary | ICD-10-CM | POA: Insufficient documentation

## 2022-10-31 DIAGNOSIS — W19XXXA Unspecified fall, initial encounter: Secondary | ICD-10-CM | POA: Insufficient documentation

## 2022-10-31 DIAGNOSIS — M47812 Spondylosis without myelopathy or radiculopathy, cervical region: Secondary | ICD-10-CM | POA: Diagnosis not present

## 2022-10-31 DIAGNOSIS — S0003XA Contusion of scalp, initial encounter: Secondary | ICD-10-CM | POA: Diagnosis not present

## 2022-10-31 DIAGNOSIS — I672 Cerebral atherosclerosis: Secondary | ICD-10-CM | POA: Diagnosis not present

## 2022-10-31 DIAGNOSIS — R58 Hemorrhage, not elsewhere classified: Secondary | ICD-10-CM | POA: Diagnosis not present

## 2022-10-31 DIAGNOSIS — I10 Essential (primary) hypertension: Secondary | ICD-10-CM | POA: Diagnosis not present

## 2022-10-31 DIAGNOSIS — I959 Hypotension, unspecified: Secondary | ICD-10-CM | POA: Diagnosis not present

## 2022-10-31 DIAGNOSIS — Z7901 Long term (current) use of anticoagulants: Secondary | ICD-10-CM | POA: Insufficient documentation

## 2022-10-31 DIAGNOSIS — G309 Alzheimer's disease, unspecified: Secondary | ICD-10-CM | POA: Diagnosis not present

## 2022-10-31 DIAGNOSIS — R9082 White matter disease, unspecified: Secondary | ICD-10-CM | POA: Diagnosis not present

## 2022-10-31 NOTE — ED Triage Notes (Signed)
Pt BIB EMS from [facility]. Was found on the floor by staff, Unknown downtime. Presents with laceration active bleeding to right forehead. Pressure dressing and C-collar applied on arrival to ED. Alzheimer at baseline. Pt alert and oriented to self.

## 2022-10-31 NOTE — ED Provider Notes (Signed)
[name] AT [facility] Provider Note   CSN: [id number] Arrival date & time: 10/31/22  2339     History  Chief Complaint  Patient presents with   Fall    Eliquis     Brianna Hunter is a 89 y.o. female.  Patient presents to the emergency department from skilled nursing facility.  Patient had an unwitnessed fall, was found on the ground.  Patient with right-sided head laceration, bleeding controlled with a pressure dressing.  She is on Eliquis.  Patient confused at baseline secondary to Alzheimer's.  She is awake, alert and answering questions at arrival.  She does not have any specific complaints.       Home Medications Prior to Admission medications   Medication Sig Start Date End Date Taking? Authorizing Provider  acetaminophen (TYLENOL) 500 MG tablet Take 500-1,000 mg by mouth every 6 (six) hours as needed for mild pain.     [provider]  Ascorbic Acid (VITAMIN C PO) Take 1 tablet by mouth every other day.     [provider]  bimatoprost (LUMIGAN) 0.01 % SOLN Place 1 drop into both eyes at bedtime.    [provider]  citalopram (CELEXA) 20 MG tablet Take 20 mg by mouth daily.    [provider]  cycloSPORINE (RESTASIS) 0.05 % ophthalmic emulsion Place 1 drop into both eyes every 12 (twelve) hours.    [provider]  Eliquis DVT/PE Starter Pack (ELIQUIS STARTER PACK) 5 MG TABS Take as directed on package: start with two-5mg  tablets twice daily for 7 days. On day 8, switch to one-5mg  tablet twice daily. Patient not taking: Reported on 10/12/2018 09/30/18   Elease Etienne, MD  latanoprost (XALATAN) 0.005 % ophthalmic solution Place 1 drop into both eyes at bedtime.    [provider]  magnesium hydroxide (MILK OF MAGNESIA) 400 MG/5ML suspension Take 15-30 mLs by mouth daily as needed for mild constipation.    [provider]  Olmesartan-amLODIPine-HCTZ 40-5-25 MG TABS Take 1  tablet by mouth daily.    [provider]  XARELTO 10 MG TABS tablet Take 10 mg by mouth daily at 6 PM.  10/02/18   [provider]      Allergies    Milk-related compounds, Augmentin [amoxicillin-pot clavulanate], and Tape    Review of Systems   Review of Systems  Physical Exam Updated Vital Signs BP 113/66   Pulse (!) 59   Temp 97.7 F (36.5 C) (Oral)   Resp 13   SpO2 100%  Physical Exam Vitals and nursing note reviewed.  Constitutional:      General: She is not in acute distress.    Appearance: She is well-developed.  HENT:     Head: Normocephalic. Laceration present.      Mouth/Throat:     Mouth: Mucous membranes are moist.  Eyes:     General: Vision grossly intact. Gaze aligned appropriately.     Extraocular Movements: Extraocular movements intact.     Conjunctiva/sclera: Conjunctivae normal.  Cardiovascular:     Rate and Rhythm: Normal rate and regular rhythm.     Pulses: Normal pulses.     Heart sounds: Normal heart sounds, S1 normal and S2 normal. No murmur heard.    No friction rub. No gallop.  Pulmonary:     Effort: Pulmonary effort is normal. No respiratory distress.     Breath sounds: Normal breath sounds.  Abdominal:     General: Bowel sounds are  normal.     Palpations: Abdomen is soft.     Tenderness: There is no abdominal tenderness. There is no guarding or rebound.     Hernia: No hernia is present.  Musculoskeletal:        General: No swelling.     Cervical back: Full passive range of motion without pain, normal range of motion and neck supple. No spinous process tenderness or muscular tenderness. Normal range of motion.     Right lower leg: No edema.     Left lower leg: No edema.  Skin:    General: Skin is warm and dry.     Capillary Refill: Capillary refill takes less than 2 seconds.     Findings: No ecchymosis, erythema, rash or wound.  Neurological:     General: No focal deficit present.     Mental Status: She is alert and  oriented to person, place, and time.     GCS: GCS eye subscore is 4. GCS verbal subscore is 5. GCS motor subscore is 6.     Cranial Nerves: Cranial nerves 2-12 are intact.     Sensory: Sensation is intact.     Motor: Motor function is intact.     Coordination: Coordination is intact.  Psychiatric:        Attention and Perception: Attention normal.        Mood and Affect: Mood normal.        Speech: Speech normal.        Behavior: Behavior normal.     ED Results / Procedures / Treatments   Labs (all labs ordered are listed, but only abnormal results are displayed) Labs Reviewed - No data to display  EKG EKG Interpretation Date/Time:  Thursday October 31 2022 23:51:40 EDT Ventricular Rate:  75 PR Interval:  165 QRS Duration:  139 QT Interval:  420 QTC Calculation: 470 R Axis:   -89  Text Interpretation: Sinus rhythm RBBB and LAFB Abnormal lateral Q waves Baseline wander in lead(s) V3 No acute changes Confirmed by Gilda Crease [phone]) on 10/31/2022 11:54:19 PM  Radiology CT CERVICAL SPINE WO CONTRAST  Result Date: 11/01/2022 CLINICAL DATA:  Found on floor by staff. Unknown downtime. Laceration with active bleeding to right forehead. EXAM: CT HEAD WITHOUT CONTRAST CT CERVICAL SPINE WITHOUT CONTRAST TECHNIQUE: Multidetector CT imaging of the head and cervical spine was performed following the standard protocol without intravenous contrast. Multiplanar CT image reconstructions of the cervical spine were also generated. RADIATION DOSE REDUCTION: This exam was performed according to the departmental dose-optimization program which includes automated exposure control, adjustment of the mA and/or kV according to patient size and/or use of iterative reconstruction technique. COMPARISON:  CT head 07/13/2018 and report from CT head and cervical spine 03/07/2021 FINDINGS: CT HEAD FINDINGS Brain: No intracranial hemorrhage, mass effect, or evidence of acute infarct. No hydrocephalus. No  extra-axial fluid collection. Age commensurate cerebral atrophy and ill-defined hypoattenuation within the cerebral white matter is nonspecific but consistent with chronic small vessel ischemic disease. Vascular: No hyperdense vessel. Intracranial arterial calcification. Skull: Right frontotemporal scalp hematoma/laceration. No calvarial fracture. Sinuses/Orbits: No acute finding. Other: None. CT CERVICAL SPINE FINDINGS Alignment: No evidence of traumatic malalignment. Loss of lordosis is likely chronic/positional. Skull base and vertebrae: No acute fracture. No primary bone lesion or focal pathologic process. Soft tissues and spinal canal: No prevertebral fluid or swelling. No visible canal hematoma. Disc levels: Multilevel age-related degenerative spondylosis. Multilevel posterior disc osteophyte complexes cause mild to moderate effacement of  the ventral thecal sac. Multilevel advanced neural foraminal narrowing due to uncovertebral spurring and facet arthropathy. Upper chest: No acute abnormality. Other: Carotid calcification. IMPRESSION: 1. No acute intracranial abnormality. Generalized atrophy and small vessel white matter disease. 2. No acute fracture in the cervical spine. Multilevel degenerative spondylosis. Electronically Signed   By: Minerva Fester M.D.   On: 11/01/2022 00:33   CT HEAD WO CONTRAST ( )  Result Date: 11/01/2022 CLINICAL DATA:  Found on floor by staff. Unknown downtime. Laceration with active bleeding to right forehead. EXAM: CT HEAD WITHOUT CONTRAST CT CERVICAL SPINE WITHOUT CONTRAST TECHNIQUE: Multidetector CT imaging of the head and cervical spine was performed following the standard protocol without intravenous contrast. Multiplanar CT image reconstructions of the cervical spine were also generated. RADIATION DOSE REDUCTION: This exam was performed according to the departmental dose-optimization program which includes automated exposure control, adjustment of the mA and/or kV  according to patient size and/or use of iterative reconstruction technique. COMPARISON:  CT head 07/13/2018 and report from CT head and cervical spine 03/07/2021 FINDINGS: CT HEAD FINDINGS Brain: No intracranial hemorrhage, mass effect, or evidence of acute infarct. No hydrocephalus. No extra-axial fluid collection. Age commensurate cerebral atrophy and ill-defined hypoattenuation within the cerebral white matter is nonspecific but consistent with chronic small vessel ischemic disease. Vascular: No hyperdense vessel. Intracranial arterial calcification. Skull: Right frontotemporal scalp hematoma/laceration. No calvarial fracture. Sinuses/Orbits: No acute finding. Other: None. CT CERVICAL SPINE FINDINGS Alignment: No evidence of traumatic malalignment. Loss of lordosis is likely chronic/positional. Skull base and vertebrae: No acute fracture. No primary bone lesion or focal pathologic process. Soft tissues and spinal canal: No prevertebral fluid or swelling. No visible canal hematoma. Disc levels: Multilevel age-related degenerative spondylosis. Multilevel posterior disc osteophyte complexes cause mild to moderate effacement of the ventral thecal sac. Multilevel advanced neural foraminal narrowing due to uncovertebral spurring and facet arthropathy. Upper chest: No acute abnormality. Other: Carotid calcification. IMPRESSION: 1. No acute intracranial abnormality. Generalized atrophy and small vessel white matter disease. 2. No acute fracture in the cervical spine. Multilevel degenerative spondylosis. Electronically Signed   By: Minerva Fester M.D.   On: 11/01/2022 00:33    Procedures .Marland KitchenLaceration Repair  Date/Time: 11/01/2022 1:28 AM  Performed by: Gilda Crease, MD Authorized by: Gilda Crease, MD   Consent:    Consent obtained:  Emergent situation Universal protocol:    Test results available: yes     Imaging studies available: yes     Required blood products, implants, devices, and  special equipment available: yes     Site/side marked: yes     Immediately prior to procedure, a time out was called: yes     Patient identity confirmed:  Hospital-assigned identification number Anesthesia:    Anesthesia method:  Local infiltration   Local anesthetic:  Lidocaine 1% WITH epi Laceration details:    Location:  Face   Face location:  Forehead   Length (cm):  1.3 Pre-procedure details:    Preparation:  Patient was prepped and draped in usual sterile fashion and imaging obtained to evaluate for foreign bodies Exploration:    Limited defect created (wound extended): yes     Hemostasis achieved with:  Epinephrine and direct pressure   Contaminated: no   Treatment:    Area cleansed with:  Chlorhexidine   Amount of cleaning:  Standard   Irrigation solution:  Sterile saline   Irrigation method:  Syringe   Debridement:  None   Undermining:  None Skin repair:  Repair method:  Sutures   Suture size:  5-0   Suture material:  Fast-absorbing gut   Suture technique:  Simple interrupted   Number of sutures:  4 Approximation:    Approximation:  Close Repair type:    Repair type:  Simple Post-procedure details:    Dressing:  Open (no dressing)   Procedure completion:  Tolerated well, no immediate complications     Medications Ordered in ED Medications  lidocaine-EPINEPHrine (PF) (XYLOCAINE-EPINEPHrine) 1 %-1:200000 (PF) injection 10 mL (10 mLs Infiltration Given 11/01/22 0116)    ED Course/ Medical Decision Making/ A&P                                 Medical Decision Making Amount and/or Complexity of Data Reviewed Radiology: ordered.  Risk Prescription drug management.   Differential diagnosis considered includes, but not limited to: Blunt trauma including head injury. intracranial injury, spinal injury, thoracic injury, intra-abdominal and retroperitoneal injury, orthopedic injury  Presents after an witnessed fall.  She is found on the ground bleeding from  the right temple area.  Patient does take Eliquis.  Patient is awake and alert, at her normal baseline confusion secondary to her dementia.  She is without complaints at arrival.  Careful examination reveals that she is moving all 4 extremities, no evidence of orthopedic trauma.  Moving both hips without pain.  No midline tenderness of the thoracic or lumbar spine.  Patient underwent CT head and cervical spine.  No acute abnormalities noted.  Small laceration over the right eye was sutured.  Absorbable sutures were utilized.  Pressure dressing over top as there was some continuous oozing of blood that significantly improved after injection with epinephrine.       Final Clinical Impression(s) / ED Diagnoses Final diagnoses:  Facial laceration, initial encounter    Rx / DC Orders ED Discharge Orders     None         Abu Heavin, Canary Brim, MD 11/01/22 0131

## 2022-11-01 ENCOUNTER — Emergency Department (HOSPITAL_COMMUNITY): Payer: Medicare HMO

## 2022-11-01 DIAGNOSIS — Z743 Need for continuous supervision: Secondary | ICD-10-CM | POA: Diagnosis not present

## 2022-11-01 DIAGNOSIS — I672 Cerebral atherosclerosis: Secondary | ICD-10-CM | POA: Diagnosis not present

## 2022-11-01 DIAGNOSIS — S0003XA Contusion of scalp, initial encounter: Secondary | ICD-10-CM | POA: Diagnosis not present

## 2022-11-01 DIAGNOSIS — R9082 White matter disease, unspecified: Secondary | ICD-10-CM | POA: Diagnosis not present

## 2022-11-01 DIAGNOSIS — R1312 Dysphagia, oropharyngeal phase: Secondary | ICD-10-CM | POA: Diagnosis not present

## 2022-11-01 DIAGNOSIS — I959 Hypotension, unspecified: Secondary | ICD-10-CM | POA: Diagnosis not present

## 2022-11-01 DIAGNOSIS — M47812 Spondylosis without myelopathy or radiculopathy, cervical region: Secondary | ICD-10-CM | POA: Diagnosis not present

## 2022-11-01 DIAGNOSIS — M6281 Muscle weakness (generalized): Secondary | ICD-10-CM | POA: Diagnosis not present

## 2022-11-01 DIAGNOSIS — R2689 Other abnormalities of gait and mobility: Secondary | ICD-10-CM | POA: Diagnosis not present

## 2022-11-01 MED ORDER — LIDOCAINE-EPINEPHRINE (PF) 1 %-1:200000 IJ SOLN
10.0000 mL | Freq: Once | INTRAMUSCULAR | Status: AC
  Administered 2022-11-01: 10 mL
  Filled 2022-11-01: qty 30

## 2022-11-01 NOTE — Discharge Instructions (Addendum)
CT of head and neck were negative.  Patient had sutures placed in laceration over right eye.  These will dissolve, do not need to be removed.

## 2022-11-01 NOTE — ED Notes (Signed)
SNF called updated that pt is leaving ED at this time returning to facility. Staff updated RN unable to reach family.

## 2022-11-01 NOTE — ED Notes (Signed)
Patient transported to CT 

## 2022-11-04 DIAGNOSIS — R1312 Dysphagia, oropharyngeal phase: Secondary | ICD-10-CM | POA: Diagnosis not present

## 2022-11-04 DIAGNOSIS — M6281 Muscle weakness (generalized): Secondary | ICD-10-CM | POA: Diagnosis not present

## 2022-11-04 DIAGNOSIS — R2689 Other abnormalities of gait and mobility: Secondary | ICD-10-CM | POA: Diagnosis not present

## 2022-11-05 DIAGNOSIS — R2689 Other abnormalities of gait and mobility: Secondary | ICD-10-CM | POA: Diagnosis not present

## 2022-11-05 DIAGNOSIS — M6281 Muscle weakness (generalized): Secondary | ICD-10-CM | POA: Diagnosis not present

## 2022-11-05 DIAGNOSIS — R1312 Dysphagia, oropharyngeal phase: Secondary | ICD-10-CM | POA: Diagnosis not present

## 2022-11-06 DIAGNOSIS — R1312 Dysphagia, oropharyngeal phase: Secondary | ICD-10-CM | POA: Diagnosis not present

## 2022-11-06 DIAGNOSIS — M6281 Muscle weakness (generalized): Secondary | ICD-10-CM | POA: Diagnosis not present

## 2022-11-06 DIAGNOSIS — R2689 Other abnormalities of gait and mobility: Secondary | ICD-10-CM | POA: Diagnosis not present

## 2022-11-07 DIAGNOSIS — M6281 Muscle weakness (generalized): Secondary | ICD-10-CM | POA: Diagnosis not present

## 2022-11-07 DIAGNOSIS — R1312 Dysphagia, oropharyngeal phase: Secondary | ICD-10-CM | POA: Diagnosis not present

## 2022-11-07 DIAGNOSIS — R2689 Other abnormalities of gait and mobility: Secondary | ICD-10-CM | POA: Diagnosis not present

## 2022-11-09 DIAGNOSIS — M7981 Nontraumatic hematoma of soft tissue: Secondary | ICD-10-CM | POA: Diagnosis not present

## 2022-11-10 DIAGNOSIS — R1312 Dysphagia, oropharyngeal phase: Secondary | ICD-10-CM | POA: Diagnosis not present

## 2022-11-16 DIAGNOSIS — R1312 Dysphagia, oropharyngeal phase: Secondary | ICD-10-CM | POA: Diagnosis not present

## 2022-11-21 DIAGNOSIS — I7091 Generalized atherosclerosis: Secondary | ICD-10-CM | POA: Diagnosis not present

## 2022-11-21 DIAGNOSIS — B351 Tinea unguium: Secondary | ICD-10-CM | POA: Diagnosis not present

## 2022-11-22 DIAGNOSIS — R1312 Dysphagia, oropharyngeal phase: Secondary | ICD-10-CM | POA: Diagnosis not present

## 2022-12-04 DIAGNOSIS — G4700 Insomnia, unspecified: Secondary | ICD-10-CM | POA: Diagnosis not present

## 2022-12-04 DIAGNOSIS — F32A Depression, unspecified: Secondary | ICD-10-CM | POA: Diagnosis not present

## 2022-12-23 DIAGNOSIS — I342 Nonrheumatic mitral (valve) stenosis: Secondary | ICD-10-CM | POA: Diagnosis not present

## 2022-12-23 DIAGNOSIS — G309 Alzheimer's disease, unspecified: Secondary | ICD-10-CM | POA: Diagnosis not present

## 2022-12-23 DIAGNOSIS — I4891 Unspecified atrial fibrillation: Secondary | ICD-10-CM | POA: Diagnosis not present

## 2023-01-08 DIAGNOSIS — F02A2 Dementia in other diseases classified elsewhere, mild, with psychotic disturbance: Secondary | ICD-10-CM | POA: Diagnosis not present

## 2023-01-08 DIAGNOSIS — G4700 Insomnia, unspecified: Secondary | ICD-10-CM | POA: Diagnosis not present

## 2023-01-08 DIAGNOSIS — F32A Depression, unspecified: Secondary | ICD-10-CM | POA: Diagnosis not present

## 2023-01-20 DIAGNOSIS — M6281 Muscle weakness (generalized): Secondary | ICD-10-CM | POA: Diagnosis not present

## 2023-01-29 DIAGNOSIS — H1712 Central corneal opacity, left eye: Secondary | ICD-10-CM | POA: Diagnosis not present

## 2023-01-29 DIAGNOSIS — Z961 Presence of intraocular lens: Secondary | ICD-10-CM | POA: Diagnosis not present

## 2023-01-29 DIAGNOSIS — H5201 Hypermetropia, right eye: Secondary | ICD-10-CM | POA: Diagnosis not present

## 2023-01-29 DIAGNOSIS — H401134 Primary open-angle glaucoma, bilateral, indeterminate stage: Secondary | ICD-10-CM | POA: Diagnosis not present

## 2023-02-11 DIAGNOSIS — F02A2 Dementia in other diseases classified elsewhere, mild, with psychotic disturbance: Secondary | ICD-10-CM | POA: Diagnosis not present

## 2023-02-11 DIAGNOSIS — F32A Depression, unspecified: Secondary | ICD-10-CM | POA: Diagnosis not present

## 2023-02-11 DIAGNOSIS — G4700 Insomnia, unspecified: Secondary | ICD-10-CM | POA: Diagnosis not present

## 2023-02-20 DIAGNOSIS — G309 Alzheimer's disease, unspecified: Secondary | ICD-10-CM | POA: Diagnosis not present

## 2023-02-20 DIAGNOSIS — I4891 Unspecified atrial fibrillation: Secondary | ICD-10-CM | POA: Diagnosis not present

## 2023-02-20 DIAGNOSIS — I342 Nonrheumatic mitral (valve) stenosis: Secondary | ICD-10-CM | POA: Diagnosis not present

## 2023-02-20 DIAGNOSIS — R296 Repeated falls: Secondary | ICD-10-CM | POA: Diagnosis not present

## 2023-02-26 DIAGNOSIS — I10 Essential (primary) hypertension: Secondary | ICD-10-CM | POA: Diagnosis not present

## 2023-02-26 DIAGNOSIS — Z5181 Encounter for therapeutic drug level monitoring: Secondary | ICD-10-CM | POA: Diagnosis not present

## 2023-02-26 DIAGNOSIS — E559 Vitamin D deficiency, unspecified: Secondary | ICD-10-CM | POA: Diagnosis not present

## 2023-03-03 DIAGNOSIS — I4891 Unspecified atrial fibrillation: Secondary | ICD-10-CM | POA: Diagnosis not present

## 2023-03-03 DIAGNOSIS — D649 Anemia, unspecified: Secondary | ICD-10-CM | POA: Diagnosis not present

## 2023-03-03 DIAGNOSIS — G309 Alzheimer's disease, unspecified: Secondary | ICD-10-CM | POA: Diagnosis not present

## 2023-03-03 DIAGNOSIS — I342 Nonrheumatic mitral (valve) stenosis: Secondary | ICD-10-CM | POA: Diagnosis not present

## 2023-03-19 DIAGNOSIS — G4700 Insomnia, unspecified: Secondary | ICD-10-CM | POA: Diagnosis not present

## 2023-03-19 DIAGNOSIS — F02A2 Dementia in other diseases classified elsewhere, mild, with psychotic disturbance: Secondary | ICD-10-CM | POA: Diagnosis not present

## 2023-03-19 DIAGNOSIS — F32A Depression, unspecified: Secondary | ICD-10-CM | POA: Diagnosis not present

## 2023-03-26 DIAGNOSIS — G309 Alzheimer's disease, unspecified: Secondary | ICD-10-CM | POA: Diagnosis not present

## 2023-03-26 DIAGNOSIS — I350 Nonrheumatic aortic (valve) stenosis: Secondary | ICD-10-CM | POA: Diagnosis not present

## 2023-03-26 DIAGNOSIS — I4891 Unspecified atrial fibrillation: Secondary | ICD-10-CM | POA: Diagnosis not present

## 2023-04-21 DIAGNOSIS — R2689 Other abnormalities of gait and mobility: Secondary | ICD-10-CM | POA: Diagnosis not present

## 2023-04-21 DIAGNOSIS — M6281 Muscle weakness (generalized): Secondary | ICD-10-CM | POA: Diagnosis not present

## 2023-04-22 DIAGNOSIS — I482 Chronic atrial fibrillation, unspecified: Secondary | ICD-10-CM | POA: Diagnosis not present

## 2023-04-22 DIAGNOSIS — M6281 Muscle weakness (generalized): Secondary | ICD-10-CM | POA: Diagnosis not present

## 2023-04-22 DIAGNOSIS — F32A Depression, unspecified: Secondary | ICD-10-CM | POA: Diagnosis not present

## 2023-04-22 DIAGNOSIS — R2689 Other abnormalities of gait and mobility: Secondary | ICD-10-CM | POA: Diagnosis not present

## 2023-04-22 DIAGNOSIS — I10 Essential (primary) hypertension: Secondary | ICD-10-CM | POA: Diagnosis not present

## 2023-04-23 DIAGNOSIS — M6281 Muscle weakness (generalized): Secondary | ICD-10-CM | POA: Diagnosis not present

## 2023-04-23 DIAGNOSIS — R2689 Other abnormalities of gait and mobility: Secondary | ICD-10-CM | POA: Diagnosis not present

## 2023-04-24 DIAGNOSIS — M6281 Muscle weakness (generalized): Secondary | ICD-10-CM | POA: Diagnosis not present

## 2023-04-24 DIAGNOSIS — R2689 Other abnormalities of gait and mobility: Secondary | ICD-10-CM | POA: Diagnosis not present

## 2023-04-25 DIAGNOSIS — M6281 Muscle weakness (generalized): Secondary | ICD-10-CM | POA: Diagnosis not present

## 2023-04-25 DIAGNOSIS — R2689 Other abnormalities of gait and mobility: Secondary | ICD-10-CM | POA: Diagnosis not present

## 2023-04-28 DIAGNOSIS — M6281 Muscle weakness (generalized): Secondary | ICD-10-CM | POA: Diagnosis not present

## 2023-04-28 DIAGNOSIS — F02A2 Dementia in other diseases classified elsewhere, mild, with psychotic disturbance: Secondary | ICD-10-CM | POA: Diagnosis not present

## 2023-04-28 DIAGNOSIS — G4700 Insomnia, unspecified: Secondary | ICD-10-CM | POA: Diagnosis not present

## 2023-04-28 DIAGNOSIS — R2689 Other abnormalities of gait and mobility: Secondary | ICD-10-CM | POA: Diagnosis not present

## 2023-04-28 DIAGNOSIS — F32A Depression, unspecified: Secondary | ICD-10-CM | POA: Diagnosis not present

## 2023-04-29 DIAGNOSIS — M6281 Muscle weakness (generalized): Secondary | ICD-10-CM | POA: Diagnosis not present

## 2023-04-29 DIAGNOSIS — R2689 Other abnormalities of gait and mobility: Secondary | ICD-10-CM | POA: Diagnosis not present

## 2023-04-30 DIAGNOSIS — M6281 Muscle weakness (generalized): Secondary | ICD-10-CM | POA: Diagnosis not present

## 2023-04-30 DIAGNOSIS — R2689 Other abnormalities of gait and mobility: Secondary | ICD-10-CM | POA: Diagnosis not present

## 2023-05-01 DIAGNOSIS — M6281 Muscle weakness (generalized): Secondary | ICD-10-CM | POA: Diagnosis not present

## 2023-05-01 DIAGNOSIS — R2689 Other abnormalities of gait and mobility: Secondary | ICD-10-CM | POA: Diagnosis not present

## 2023-05-02 DIAGNOSIS — M6281 Muscle weakness (generalized): Secondary | ICD-10-CM | POA: Diagnosis not present

## 2023-05-02 DIAGNOSIS — R2689 Other abnormalities of gait and mobility: Secondary | ICD-10-CM | POA: Diagnosis not present

## 2023-05-03 DIAGNOSIS — I10 Essential (primary) hypertension: Secondary | ICD-10-CM | POA: Diagnosis not present

## 2023-05-16 DIAGNOSIS — H401134 Primary open-angle glaucoma, bilateral, indeterminate stage: Secondary | ICD-10-CM | POA: Diagnosis not present

## 2023-05-16 DIAGNOSIS — Z961 Presence of intraocular lens: Secondary | ICD-10-CM | POA: Diagnosis not present

## 2023-05-16 DIAGNOSIS — H1712 Central corneal opacity, left eye: Secondary | ICD-10-CM | POA: Diagnosis not present

## 2023-05-17 DIAGNOSIS — R059 Cough, unspecified: Secondary | ICD-10-CM | POA: Diagnosis not present

## 2023-05-19 DIAGNOSIS — D649 Anemia, unspecified: Secondary | ICD-10-CM | POA: Diagnosis not present

## 2023-05-19 DIAGNOSIS — R627 Adult failure to thrive: Secondary | ICD-10-CM | POA: Diagnosis not present

## 2023-05-19 DIAGNOSIS — R634 Abnormal weight loss: Secondary | ICD-10-CM | POA: Diagnosis not present

## 2023-05-19 DIAGNOSIS — J1181 Influenza due to unidentified influenza virus with encephalopathy: Secondary | ICD-10-CM | POA: Diagnosis not present

## 2023-05-19 DIAGNOSIS — J101 Influenza due to other identified influenza virus with other respiratory manifestations: Secondary | ICD-10-CM | POA: Diagnosis not present

## 2023-05-19 DIAGNOSIS — H409 Unspecified glaucoma: Secondary | ICD-10-CM | POA: Diagnosis not present

## 2023-05-19 DIAGNOSIS — E559 Vitamin D deficiency, unspecified: Secondary | ICD-10-CM | POA: Diagnosis not present

## 2023-05-19 DIAGNOSIS — J1000 Influenza due to other identified influenza virus with unspecified type of pneumonia: Secondary | ICD-10-CM | POA: Diagnosis not present

## 2023-05-20 DIAGNOSIS — J690 Pneumonitis due to inhalation of food and vomit: Secondary | ICD-10-CM | POA: Diagnosis not present

## 2023-05-20 DIAGNOSIS — R402431 Glasgow coma scale score 3-8, in the field [EMT or ambulance]: Secondary | ICD-10-CM | POA: Diagnosis not present

## 2023-05-20 DIAGNOSIS — E870 Hyperosmolality and hypernatremia: Secondary | ICD-10-CM | POA: Diagnosis not present

## 2023-05-20 DIAGNOSIS — R531 Weakness: Secondary | ICD-10-CM | POA: Diagnosis not present

## 2023-05-20 DIAGNOSIS — R918 Other nonspecific abnormal finding of lung field: Secondary | ICD-10-CM | POA: Diagnosis not present

## 2023-05-20 DIAGNOSIS — A413 Sepsis due to Hemophilus influenzae: Secondary | ICD-10-CM | POA: Diagnosis not present

## 2023-05-20 DIAGNOSIS — N179 Acute kidney failure, unspecified: Secondary | ICD-10-CM | POA: Diagnosis not present

## 2023-05-20 DIAGNOSIS — R4182 Altered mental status, unspecified: Secondary | ICD-10-CM | POA: Diagnosis not present

## 2023-05-20 DIAGNOSIS — A419 Sepsis, unspecified organism: Secondary | ICD-10-CM | POA: Diagnosis not present

## 2023-05-20 DIAGNOSIS — R2231 Localized swelling, mass and lump, right upper limb: Secondary | ICD-10-CM | POA: Diagnosis not present

## 2023-05-20 DIAGNOSIS — Z4682 Encounter for fitting and adjustment of non-vascular catheter: Secondary | ICD-10-CM | POA: Diagnosis not present

## 2023-05-20 DIAGNOSIS — G9341 Metabolic encephalopathy: Secondary | ICD-10-CM | POA: Diagnosis not present

## 2023-05-20 DIAGNOSIS — R404 Transient alteration of awareness: Secondary | ICD-10-CM | POA: Diagnosis not present

## 2023-05-20 DIAGNOSIS — I959 Hypotension, unspecified: Secondary | ICD-10-CM | POA: Diagnosis not present

## 2023-05-20 DIAGNOSIS — Z7401 Bed confinement status: Secondary | ICD-10-CM | POA: Diagnosis not present

## 2023-05-20 DIAGNOSIS — Z515 Encounter for palliative care: Secondary | ICD-10-CM | POA: Diagnosis not present

## 2023-05-20 DIAGNOSIS — J159 Unspecified bacterial pneumonia: Secondary | ICD-10-CM | POA: Diagnosis not present

## 2023-05-20 DIAGNOSIS — E860 Dehydration: Secondary | ICD-10-CM | POA: Diagnosis not present

## 2023-05-20 DIAGNOSIS — R571 Hypovolemic shock: Secondary | ICD-10-CM | POA: Diagnosis not present

## 2023-05-20 DIAGNOSIS — R6521 Severe sepsis with septic shock: Secondary | ICD-10-CM | POA: Diagnosis not present

## 2023-05-20 DIAGNOSIS — D649 Anemia, unspecified: Secondary | ICD-10-CM | POA: Diagnosis not present

## 2023-05-20 DIAGNOSIS — R627 Adult failure to thrive: Secondary | ICD-10-CM | POA: Diagnosis not present

## 2023-05-20 DIAGNOSIS — A4189 Other specified sepsis: Secondary | ICD-10-CM | POA: Diagnosis not present

## 2023-05-20 DIAGNOSIS — J111 Influenza due to unidentified influenza virus with other respiratory manifestations: Secondary | ICD-10-CM | POA: Diagnosis not present

## 2023-05-20 DIAGNOSIS — J1008 Influenza due to other identified influenza virus with other specified pneumonia: Secondary | ICD-10-CM | POA: Diagnosis not present

## 2023-05-20 DIAGNOSIS — J101 Influenza due to other identified influenza virus with other respiratory manifestations: Secondary | ICD-10-CM | POA: Diagnosis not present

## 2023-05-20 DIAGNOSIS — J189 Pneumonia, unspecified organism: Secondary | ICD-10-CM | POA: Diagnosis not present

## 2023-05-20 DIAGNOSIS — I4891 Unspecified atrial fibrillation: Secondary | ICD-10-CM | POA: Diagnosis not present

## 2023-05-20 DIAGNOSIS — E861 Hypovolemia: Secondary | ICD-10-CM | POA: Diagnosis not present

## 2023-05-23 DIAGNOSIS — R918 Other nonspecific abnormal finding of lung field: Secondary | ICD-10-CM | POA: Diagnosis not present

## 2023-06-02 DIAGNOSIS — I350 Nonrheumatic aortic (valve) stenosis: Secondary | ICD-10-CM | POA: Diagnosis not present

## 2023-06-02 DIAGNOSIS — E559 Vitamin D deficiency, unspecified: Secondary | ICD-10-CM | POA: Diagnosis not present

## 2023-06-02 DIAGNOSIS — G309 Alzheimer's disease, unspecified: Secondary | ICD-10-CM | POA: Diagnosis not present

## 2023-06-02 DIAGNOSIS — I342 Nonrheumatic mitral (valve) stenosis: Secondary | ICD-10-CM | POA: Diagnosis not present

## 2023-06-02 DIAGNOSIS — H409 Unspecified glaucoma: Secondary | ICD-10-CM | POA: Diagnosis not present

## 2023-06-02 DIAGNOSIS — F32A Depression, unspecified: Secondary | ICD-10-CM | POA: Diagnosis not present

## 2023-06-02 DIAGNOSIS — I10 Essential (primary) hypertension: Secondary | ICD-10-CM | POA: Diagnosis not present

## 2023-06-02 DIAGNOSIS — R627 Adult failure to thrive: Secondary | ICD-10-CM | POA: Diagnosis not present

## 2023-06-02 DIAGNOSIS — D509 Iron deficiency anemia, unspecified: Secondary | ICD-10-CM | POA: Diagnosis not present

## 2023-06-03 DIAGNOSIS — F331 Major depressive disorder, recurrent, moderate: Secondary | ICD-10-CM | POA: Diagnosis not present

## 2023-06-03 DIAGNOSIS — F411 Generalized anxiety disorder: Secondary | ICD-10-CM | POA: Diagnosis not present
